# Patient Record
Sex: Female | Born: 1970 | Race: White | Hispanic: No | Marital: Married | Smoking: Current every day smoker
Health system: Southern US, Community
[De-identification: ages and names within clinical notes are randomized; demographics above are authoritative.]

## PROBLEM LIST (undated history)

## (undated) DIAGNOSIS — M549 Dorsalgia, unspecified: Secondary | ICD-10-CM

## (undated) DIAGNOSIS — E7800 Pure hypercholesterolemia, unspecified: Secondary | ICD-10-CM

## (undated) DIAGNOSIS — I4891 Unspecified atrial fibrillation: Secondary | ICD-10-CM

## (undated) DIAGNOSIS — F40243 Fear of flying: Secondary | ICD-10-CM

## (undated) DIAGNOSIS — F5101 Primary insomnia: Secondary | ICD-10-CM

## (undated) DIAGNOSIS — B977 Papillomavirus as the cause of diseases classified elsewhere: Secondary | ICD-10-CM

## (undated) DIAGNOSIS — R490 Dysphonia: Secondary | ICD-10-CM

## (undated) DIAGNOSIS — D519 Vitamin B12 deficiency anemia, unspecified: Secondary | ICD-10-CM

## (undated) DIAGNOSIS — K219 Gastro-esophageal reflux disease without esophagitis: Secondary | ICD-10-CM

## (undated) DIAGNOSIS — I499 Cardiac arrhythmia, unspecified: Secondary | ICD-10-CM

## (undated) DIAGNOSIS — F900 Attention-deficit hyperactivity disorder, predominantly inattentive type: Secondary | ICD-10-CM

## (undated) DIAGNOSIS — M542 Cervicalgia: Secondary | ICD-10-CM

## (undated) DIAGNOSIS — M2550 Pain in unspecified joint: Secondary | ICD-10-CM

## (undated) DIAGNOSIS — F17200 Nicotine dependence, unspecified, uncomplicated: Secondary | ICD-10-CM

## (undated) DIAGNOSIS — R600 Localized edema: Secondary | ICD-10-CM

## (undated) DIAGNOSIS — I2510 Atherosclerotic heart disease of native coronary artery without angina pectoris: Secondary | ICD-10-CM

## (undated) DIAGNOSIS — J45909 Unspecified asthma, uncomplicated: Secondary | ICD-10-CM

## (undated) DIAGNOSIS — F419 Anxiety disorder, unspecified: Secondary | ICD-10-CM

## (undated) DIAGNOSIS — M06049 Rheumatoid arthritis without rheumatoid factor, unspecified hand: Secondary | ICD-10-CM

## (undated) DIAGNOSIS — M1990 Unspecified osteoarthritis, unspecified site: Secondary | ICD-10-CM

## (undated) DIAGNOSIS — M797 Fibromyalgia: Secondary | ICD-10-CM

## (undated) DIAGNOSIS — M4317 Spondylolisthesis, lumbosacral region: Secondary | ICD-10-CM

## (undated) DIAGNOSIS — M4692 Unspecified inflammatory spondylopathy, cervical region: Secondary | ICD-10-CM

## (undated) HISTORY — DX: Primary insomnia: F51.01

## (undated) HISTORY — DX: Vitamin B12 deficiency anemia, unspecified: D51.9

## (undated) HISTORY — PX: CHOLECYSTECTOMY: SHX55

## (undated) HISTORY — DX: Pure hypercholesterolemia, unspecified: E78.00

## (undated) HISTORY — DX: Fibromyalgia: M79.7

## (undated) HISTORY — DX: Anxiety disorder, unspecified: F41.9

## (undated) HISTORY — PX: BREAST ENHANCEMENT SURGERY: SHX7

## (undated) HISTORY — DX: Papillomavirus as the cause of diseases classified elsewhere: B97.7

## (undated) HISTORY — DX: Localized edema: R60.0

## (undated) HISTORY — PX: CARPAL TUNNEL RELEASE: SHX101

## (undated) HISTORY — PX: TUBAL LIGATION: SHX77

## (undated) HISTORY — DX: Spondylolisthesis, lumbosacral region: M43.17

## (undated) HISTORY — PX: MANDIBLE SURGERY: SHX707

## (undated) HISTORY — DX: Cervicalgia: M54.2

## (undated) HISTORY — DX: Gastro-esophageal reflux disease without esophagitis: K21.9

## (undated) HISTORY — DX: Attention-deficit hyperactivity disorder, predominantly inattentive type: F90.0

## (undated) HISTORY — DX: Pain in unspecified joint: M25.50

## (undated) HISTORY — DX: Unspecified osteoarthritis, unspecified site: M19.90

## (undated) HISTORY — DX: Dorsalgia, unspecified: M54.9

## (undated) HISTORY — DX: Nicotine dependence, unspecified, uncomplicated: F17.200

## (undated) HISTORY — DX: Rheumatoid arthritis without rheumatoid factor, unspecified hand: M06.049

## (undated) HISTORY — PX: CARDIAC CATHETERIZATION: SHX172

## (undated) HISTORY — PX: FOOT SURGERY: SHX648

## (undated) HISTORY — PX: CERVICAL SPINE SURGERY: SHX589

## (undated) HISTORY — PX: TONSILLECTOMY: SUR1361

## (undated) HISTORY — DX: Dysphonia: R49.0

## (undated) HISTORY — DX: Fear of flying: F40.243

## (undated) HISTORY — PX: TYMPANOSTOMY TUBE PLACEMENT: SHX32

## (undated) HISTORY — DX: Unspecified inflammatory spondylopathy, cervical region: M46.92

---

## 1997-10-22 ENCOUNTER — Other Ambulatory Visit: Admission: RE | Admit: 1997-10-22 | Discharge: 1997-10-22 | Payer: Self-pay | Admitting: Obstetrics & Gynecology

## 2000-05-24 ENCOUNTER — Emergency Department (HOSPITAL_COMMUNITY): Admission: EM | Admit: 2000-05-24 | Discharge: 2000-05-24 | Payer: Self-pay | Admitting: Emergency Medicine

## 2001-05-13 ENCOUNTER — Encounter: Payer: Self-pay | Admitting: Surgery

## 2001-05-13 ENCOUNTER — Encounter: Payer: Self-pay | Admitting: Emergency Medicine

## 2001-05-13 ENCOUNTER — Encounter (INDEPENDENT_AMBULATORY_CARE_PROVIDER_SITE_OTHER): Payer: Self-pay | Admitting: Specialist

## 2001-05-13 ENCOUNTER — Inpatient Hospital Stay (HOSPITAL_COMMUNITY): Admission: EM | Admit: 2001-05-13 | Discharge: 2001-05-14 | Payer: Self-pay | Admitting: Emergency Medicine

## 2001-05-13 ENCOUNTER — Emergency Department (HOSPITAL_COMMUNITY): Admission: EM | Admit: 2001-05-13 | Discharge: 2001-05-13 | Payer: Self-pay | Admitting: *Deleted

## 2002-02-11 ENCOUNTER — Encounter: Payer: Self-pay | Admitting: Family Medicine

## 2002-02-11 ENCOUNTER — Ambulatory Visit (HOSPITAL_COMMUNITY): Admission: RE | Admit: 2002-02-11 | Discharge: 2002-02-11 | Payer: Self-pay | Admitting: Family Medicine

## 2004-01-25 ENCOUNTER — Other Ambulatory Visit: Admission: RE | Admit: 2004-01-25 | Discharge: 2004-01-25 | Payer: Self-pay | Admitting: Family Medicine

## 2004-08-11 ENCOUNTER — Emergency Department (HOSPITAL_COMMUNITY): Admission: EM | Admit: 2004-08-11 | Discharge: 2004-08-11 | Payer: Self-pay | Admitting: Emergency Medicine

## 2004-10-16 ENCOUNTER — Ambulatory Visit (HOSPITAL_COMMUNITY): Admission: RE | Admit: 2004-10-16 | Discharge: 2004-10-16 | Payer: Self-pay | Admitting: Obstetrics and Gynecology

## 2004-10-16 ENCOUNTER — Encounter (INDEPENDENT_AMBULATORY_CARE_PROVIDER_SITE_OTHER): Payer: Self-pay | Admitting: Specialist

## 2004-11-04 ENCOUNTER — Ambulatory Visit: Admission: RE | Admit: 2004-11-04 | Discharge: 2004-11-04 | Payer: Self-pay | Admitting: Gynecologic Oncology

## 2004-11-25 ENCOUNTER — Ambulatory Visit: Admission: RE | Admit: 2004-11-25 | Discharge: 2004-11-25 | Payer: Self-pay | Admitting: Gynecologic Oncology

## 2004-12-09 ENCOUNTER — Ambulatory Visit: Admission: RE | Admit: 2004-12-09 | Discharge: 2004-12-09 | Payer: Self-pay | Admitting: Gynecologic Oncology

## 2004-12-23 ENCOUNTER — Ambulatory Visit: Admission: RE | Admit: 2004-12-23 | Discharge: 2004-12-23 | Payer: Self-pay | Admitting: Gynecologic Oncology

## 2005-02-03 ENCOUNTER — Ambulatory Visit: Admission: RE | Admit: 2005-02-03 | Discharge: 2005-02-03 | Payer: Self-pay | Admitting: Gynecologic Oncology

## 2005-05-05 ENCOUNTER — Ambulatory Visit: Admission: RE | Admit: 2005-05-05 | Discharge: 2005-05-05 | Payer: Self-pay | Admitting: Gynecologic Oncology

## 2005-05-15 ENCOUNTER — Other Ambulatory Visit: Admission: RE | Admit: 2005-05-15 | Discharge: 2005-05-15 | Payer: Self-pay | Admitting: Obstetrics and Gynecology

## 2005-07-21 ENCOUNTER — Ambulatory Visit (HOSPITAL_COMMUNITY): Admission: RE | Admit: 2005-07-21 | Discharge: 2005-07-21 | Payer: Self-pay | Admitting: Obstetrics and Gynecology

## 2006-02-11 IMAGING — US US TRANSVAGINAL NON-OB
1 series · 13 of 25 positions shown · non-contrast
Comparison: none

CLINICAL DATA: Severe abdominal and pelvic pain.  Assess for cyst versus torsion. 
 PELVIC ULTRASOUND:
 Initially, transabdominal scanning is performed through a full urinary bladder.  Transvaginal scanning is then followed.  The study includes duplex and color flow Doppler evaluation of the ovaries. 
 The uterus is normal in size measuring 7.7 cm longitudinally x 3.7 x 5 cm.  The myometrium is slightly heterogeneous as one might see with early leiomyomatous change.  The possibility exists this could represent adenomyomatosis.  There is no intrauterine fluid or pregnancy.  
 The right ovary measures 4.4 x 3.3 x 3.0 cm and contains a 3.4 x 2.3 x 1.8 cm complex cyst.  This could be a hemorrhagic functional cyst.  The possibility of a tuboovarian abscess in the early stages does exist.  Endometrioma could also have this appearance.  There is blood flow to the right ovary.  The left ovary is normal measuring 2.4 x 1.6 x 1.6 cm.  It contains a few small follicular cysts and shows normal blood flow.  There is a moderate amount of free fluid.

[Series 1: unknown · 0.30mm/px · 13 of 67 slices shown]
[im 1/67]
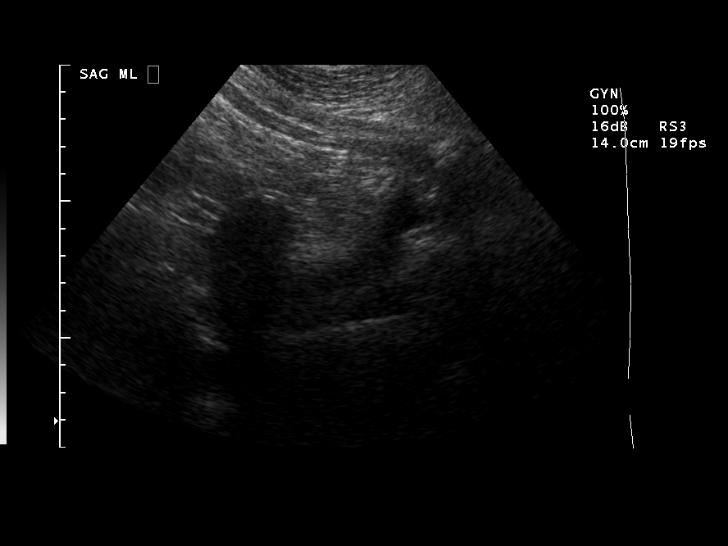
[im 6/67]
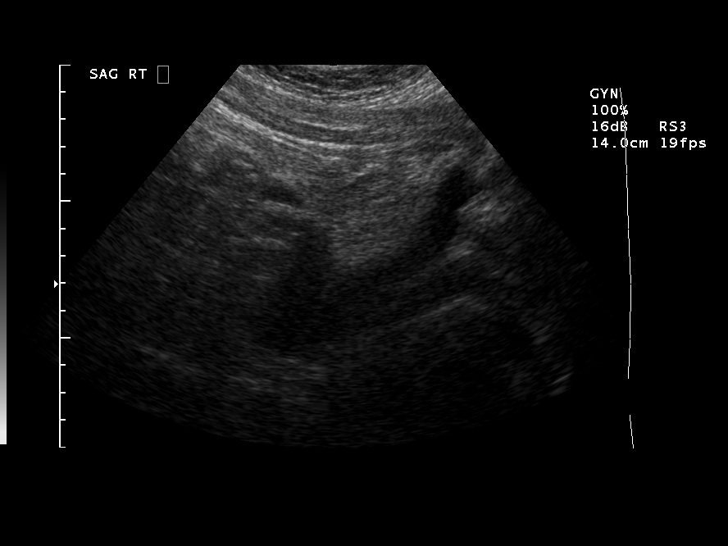
[im 12/67]
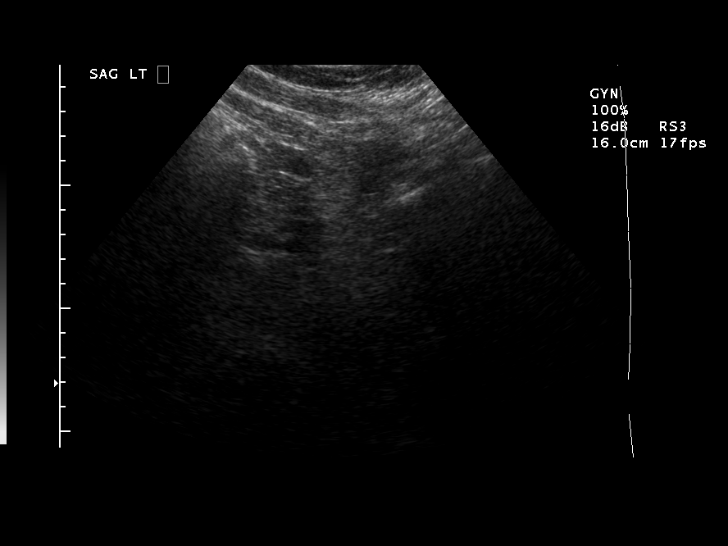
[im 17/67]
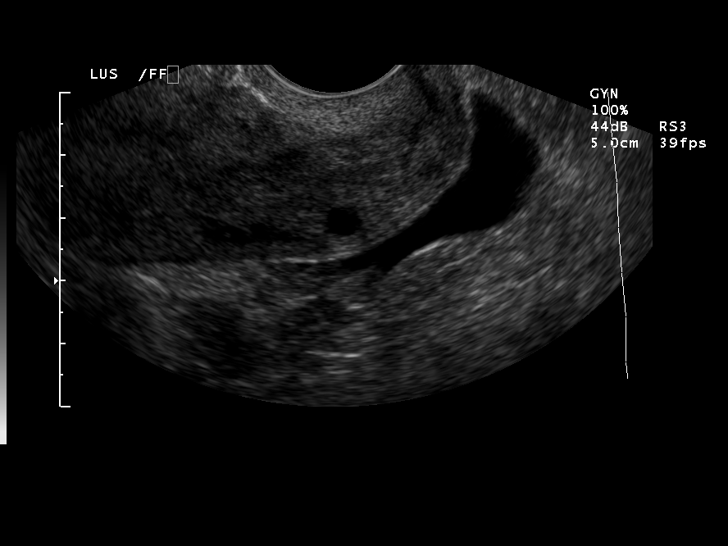
[im 23/67]
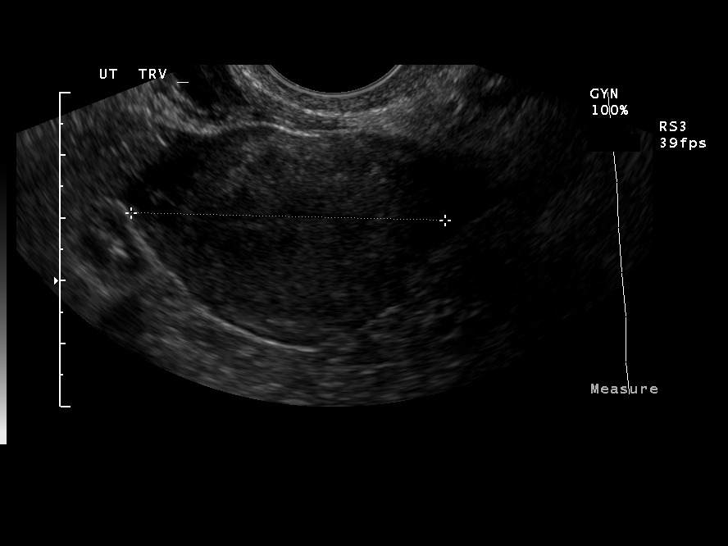
[im 28/67]
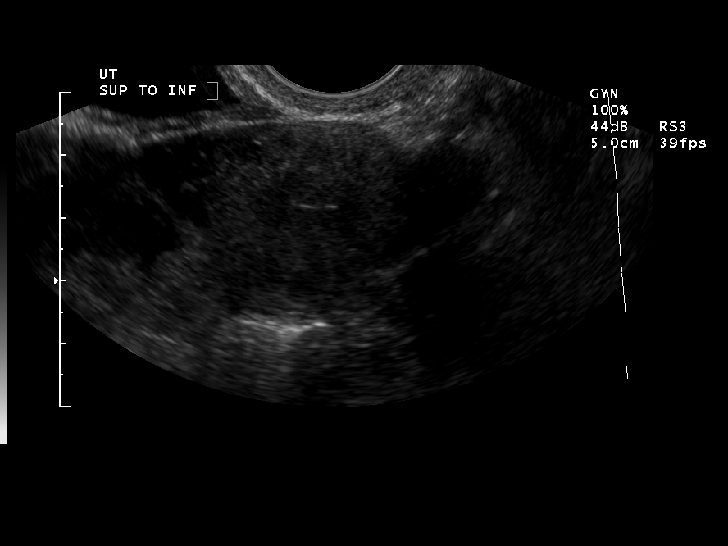
[im 34/67]
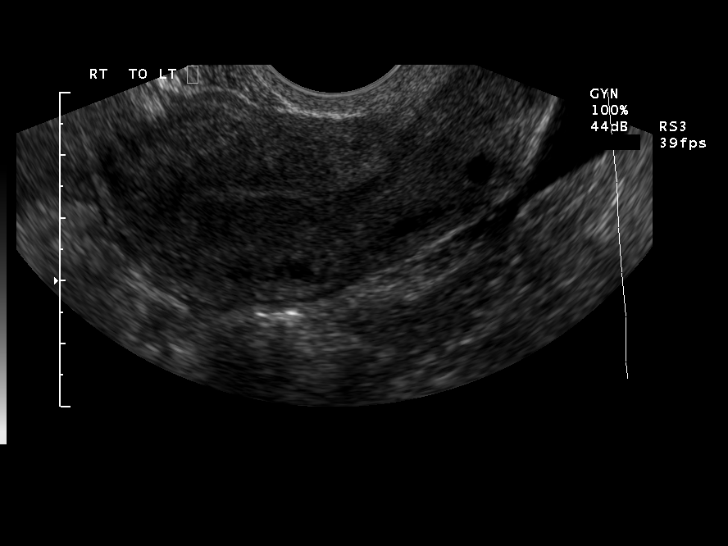
[im 39/67]
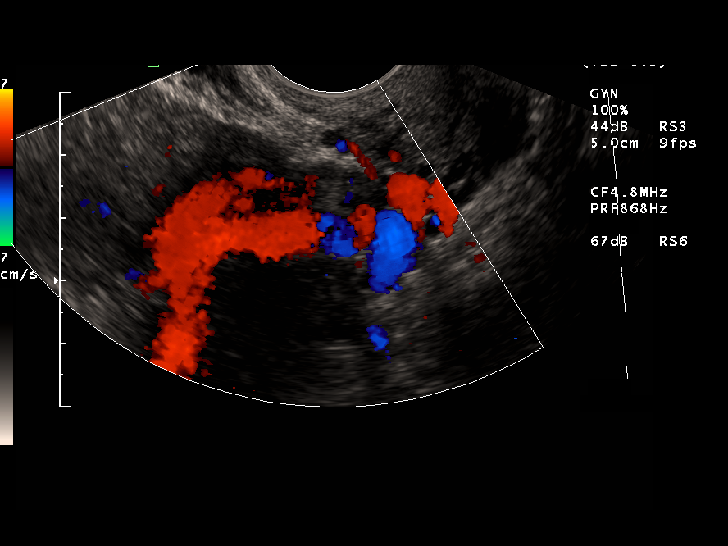
[im 45/67]
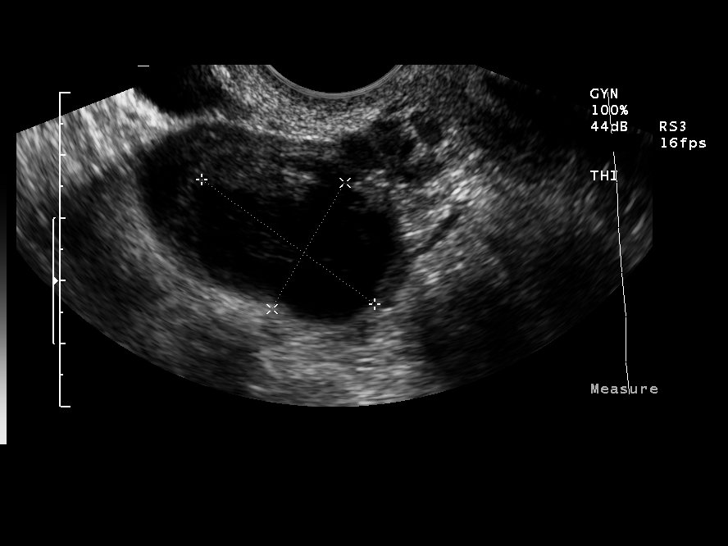
[im 50/67]
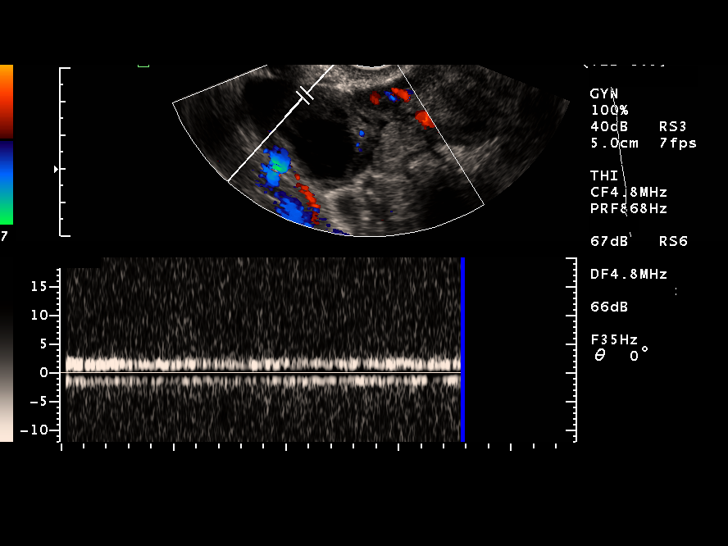
[im 56/67]
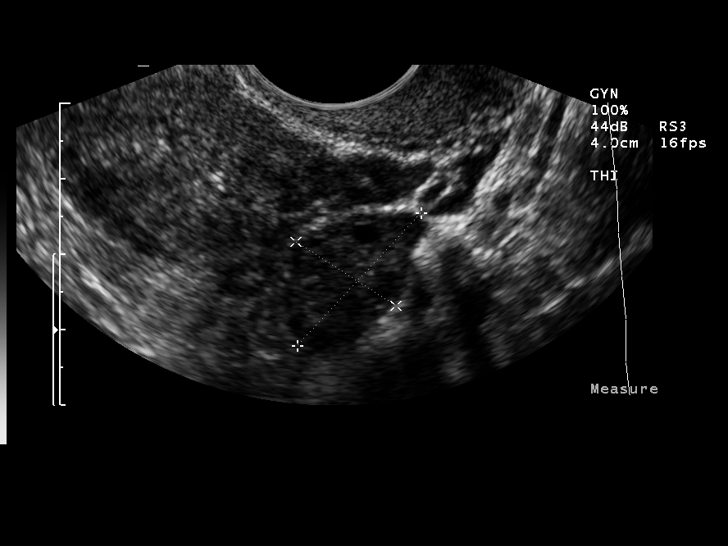
[im 61/67]
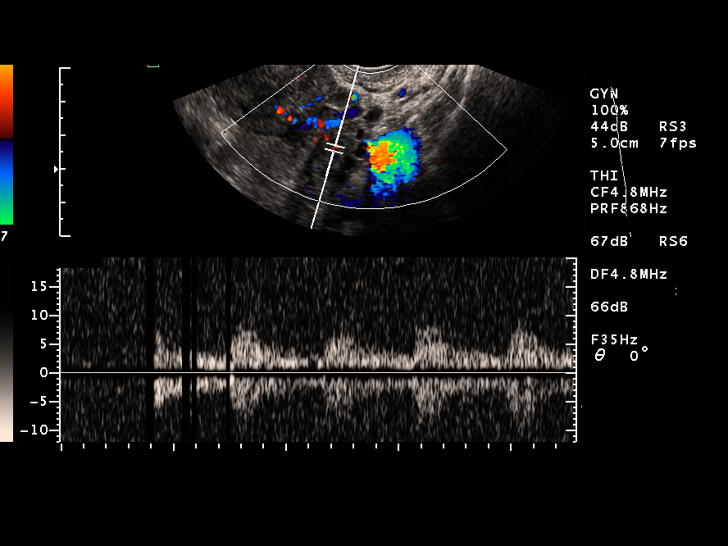
[im 67/67]
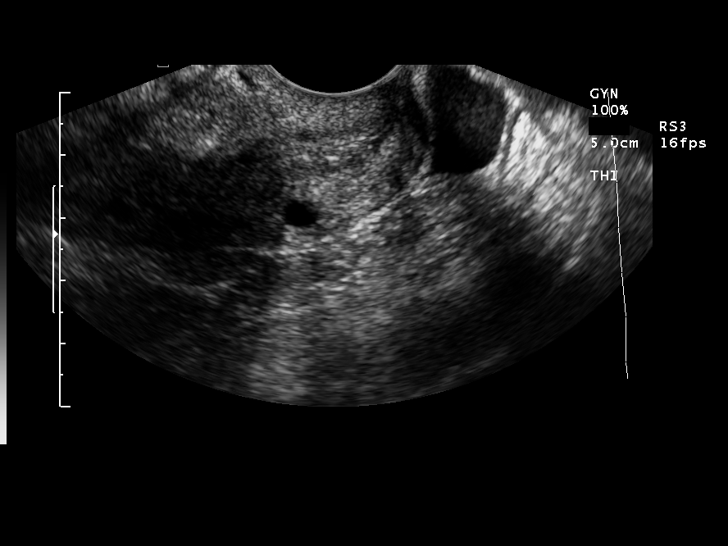

[13 of 25 positions shown; findings below may reference images not displayed]

IMPRESSION: 1.  Complex right ovarian cyst measuring 3.4 x 2.3 x 1.8 cm.  Moderate amount of free fluid.  This could be a hemorrhagic ovarian cyst.  The differential diagnosis does include tuboovarian abscess and endometrioma. 
 2.  Heterogeneous appearance of the uterus which could represent early leiomyomatous change or conceivably adenomyomatosis.  
 3.  No evidence of ovarian torsion.

## 2007-02-22 ENCOUNTER — Ambulatory Visit: Admission: RE | Admit: 2007-02-22 | Discharge: 2007-02-22 | Payer: Self-pay | Admitting: Gynecologic Oncology

## 2007-04-05 ENCOUNTER — Ambulatory Visit: Admission: RE | Admit: 2007-04-05 | Discharge: 2007-04-05 | Payer: Self-pay | Admitting: Gynecologic Oncology

## 2008-08-01 ENCOUNTER — Emergency Department (HOSPITAL_COMMUNITY): Admission: EM | Admit: 2008-08-01 | Discharge: 2008-08-01 | Payer: Self-pay | Admitting: Emergency Medicine

## 2008-10-16 ENCOUNTER — Ambulatory Visit: Admission: RE | Admit: 2008-10-16 | Discharge: 2008-10-16 | Payer: Self-pay | Admitting: Gynecologic Oncology

## 2010-03-20 ENCOUNTER — Encounter: Admission: RE | Admit: 2010-03-20 | Discharge: 2010-03-20 | Payer: Self-pay | Admitting: Gastroenterology

## 2010-09-17 ENCOUNTER — Ambulatory Visit: Payer: Self-pay | Attending: Gynecologic Oncology | Admitting: Gynecologic Oncology

## 2010-10-27 LAB — POCT URINALYSIS DIP (DEVICE)
Bilirubin Urine: NEGATIVE
Glucose, UA: NEGATIVE mg/dL
Glucose, UA: NEGATIVE mg/dL
Hgb urine dipstick: NEGATIVE
Ketones, ur: NEGATIVE mg/dL
Nitrite: NEGATIVE
Protein, ur: NEGATIVE mg/dL
Specific Gravity, Urine: 1.01 (ref 1.005–1.030)
Specific Gravity, Urine: 1.01 (ref 1.005–1.030)
Urobilinogen, UA: 0.2 mg/dL (ref 0.0–1.0)

## 2010-10-27 LAB — URINE CULTURE

## 2010-11-25 NOTE — Consult Note (Signed)
[name], [name]             ACCOUNT NO.:  [id number]   MEDICAL RECORD NO.:  [id number]          PATIENT TYPE:  OUT   LOCATION:  GYN                          FACILITY:  [facility]   PHYSICIAN:  Paola A. 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  I last saw in May 2006.  I last  saw her in October 2006 at which time she was noted to have new  condylomatous lesions.  She had been using Aldara three times a week,  used it for several weeks, and then had a period of time when she ran  out the prescription and did not use it for a month. When I saw her in  October 2006, the labia minora on the left side with almost completely  replaced with condylomatous changes.  The right side was more so on the  edge of the labia than on the side of the labia with condylomatous-type  changes in the deep posterior fourchette, left greater than right.  She  was going to use the Aldara fairly religiously. We had a phone call from  the patient stating that she did not feel that the Aldara was working  for her, and she wished to proceed with Condylox.  I did call in  Condylox 0.5% gel to use twice daily for 3 days weekly in December 2007.  We have not seen her back since that time. She has been seeing Dr.  Jackelyn Knife for routine GYN care and had a normal Pap smear which she is  currently doing. She will allow the condyloma to grow for a period time.  She will then coat her entire vulva with Condylox, usually around 10  o'clock at night, leaving it on until 8 o'clock in the morning, washes  it off, and then it excoriates her bottom. The lesions go away for a few  days, and then she notices them slowly returning. She will again take  another month to month and one-half off and do the same pain.  She  does  think that she is placing on normal, non-condylomatous tissue as she  places it throughout the vulva, so she is never able to get 3 days of  treatment in. She is typically doing one treatments every 4-6 weeks.  Most recently she has not used Condylox for the past 8 weeks.  She  otherwise is doing fairly well without any significant complaints.   PHYSICAL EXAMINATION:  External genitalia is notable for labia minora on  the patient's left side.  She has a 1 x 1-cm hyperkeratotic raised  plaque on the outside portion of the labia minora.  Just on the inside,  she has small scattered plaques, each being approximately 3-4 mm. On the  labia minora on her right side, she is again has the condylomatous type  plaque, both on the external and  internal surfaces of labia, greater on  the external surface of the labia minora on the right side than on the  internal surface.  On the posterior fourchette, she has raised  hyperpigmented lesion on the right side greater than the left.  On the  left area, she has hyperpigmented changes.  However, the areas are flat  and smooth.  The remainder of the vulva is unremarkable.   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  I do think  that having the patient look at her vulva with mirror that she is most  likely getting it on normal tissue as well, and she leaving  off for a  prolonged period time. We are going to proceed with several strategies  to see if we can get her to a period of time where she may be able to  eradicate the condyloma for several months rather than dealing with this  every month to month and one-half.  Our plan will be that she will  apply, with the help of a mirror, Vaseline to the normal unaffected  tissues. She is to  place Condylox on the abnormal tissues and wash it off within 46-hours.  She will try to do this for three consecutive days. She will do this x1,  keep a calendar of how the symptoms are, when the  lesions leave, and  then return, and plan on coming back to see me in a month.      Paola A. Duard Brady, MD  Electronically Signed     PAG/MEDQ  D:  02/22/2007  T:  02/23/2007  Job:  [id number]   cc:   Telford Nab, R.N.  [address]  [place], [place] [address]   Zenaida Niece, M.D.  Fax: [phone]

## 2010-11-25 NOTE — H&P (Signed)
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   HISTORY OF PRESENT ILLNESS:  This is a 40 year old who underwent laser  ablation of VIN 3 in May of 2006.  In October of 2006, the patient  reported return of lesions and began using Aldara.  There was futility  with the use of Aldara and she has since used Condylox gel.  In August  of 2008, significant condylomas  and scattered plaques were noted  involving both labia.  The external labia, however, were spared.  The  patient states that the lesions are stable and she denies any focal  pruritus.  She uses the Condylox likely once per week, and at this time  she reports continued use of tobacco products.   PAST MEDICAL HISTORY:  No interval changes.   PAST SURGICAL HISTORY:  No interval changes.   PHYSICAL EXAMINATION:  Well-developed female in no acute distress.  On  pelvic examination, external genitalia notable for condyloma involving  the labia minora bilaterally and involving the posterior fourchette.  There are hyperkeratotic raised plaques without extension into the  vagina.   IMPRESSION:  Vulvar condyloma.  The patient was offered at this visit a  trial of repeat CO2 laser; however, she states that the lesion is  stable, and she would prefer not to have a surgical intervention.  As  such, Condylox gel 0.5% for use twice daily  for 3 days and  discontinuation for 4 days.  She has also been advised to include  multivitamin in her diet and to stop smoking.  I have asked her to  follow up in 6 months.      Laurette Schimke, MD  Electronically Signed     WB/MEDQ  D:   10/16/2008  T:  10/16/2008  Job:  [id number]   cc:   Zenaida Niece, M.D.  Fax: [phone]   Telford Nab, R.N.  [address]  [place], [place] [address]

## 2010-11-25 NOTE — Consult Note (Signed)
[name], [name]             ACCOUNT NO.:  [id number]   MEDICAL RECORD NO.:  [id number]          PATIENT TYPE:  OUT   LOCATION:  GYN                          FACILITY:  [facility]   PHYSICIAN:  Paola A. Duard Brady, MD    DATE OF BIRTH:  [birth year]   DATE OF CONSULTATION:  04/05/2007  DATE OF DISCHARGE:                                 CONSULTATION   The patient is a 40 year old who underwent laser ablation of VIN-III in  May 2006.  By October 2006 she had new, filamentous lesions and began  using Aldara three times a week and did it for several weeks and had a  period of time when she ran out of the prescription and did not use it.  She then had consistent and recurrent condyloma.  She has essentially  Aldara, which has not really worked for her and then tried Condylox gel.  She states that the Condylox gel would help maintain the lesions for a  period of time but as soon as she stops using it, the condyloma return.  She also was complaining of significant excoriations and we felt at the  time her last visit that she was getting the Condylox on her normal  tissues and excoriating the normal tissues rather than treating only the  abnormal lesions.  When I last saw her in August 2008, she had  significant condyloma and scattered plaques among and between the labia  minora.  The external labia majora were fairly spared.  She has been  using the Condylox.  She used it for three days consecutively last  month.  She states the lesions went away completely.  Then this past  Sunday the lesions have again returned and she reapplied the Condylox.  It appears that when she went to the pharmacy she was given a Condylox  solution rather than the gel and has had some difficulty applying at,  though overall she has been more pleased with the application than she  was with the gel.   PHYSICAL EXAMINATION:  Weight 168 pounds.  Well-nourished, alert female in no acute distress.  PELVIC:  External  genitalia is notable for significant condyloma along  the labia minora bilaterally.  She has hyperkeratotic, raised plaques on  the outside portion of the labia minora, left greater than right.  Just  on the inside of the labia minora she has small, scattered plaques, each  being approximately 3-4 mm.  You can see areas of denuded skin and areas  of white changes consistent with Condylox application.   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  She was offered continued topical  therapies as it appears to maintain her disease.  She is complaining of  increased stress at this time and a surgical procedure would not be  beneficial to her due to her stress level, per her report.  She was  offered a repeat laser ablation. she is not particularly excited about  as the condyloma appear to have returned quite quickly after her laser  ablation, and she is not particularly excited regarding vulvectomy.  At  this point she will continue using the Condylox to maintain the  condylomatous changes and vulvar intraepithelial neoplasia at a stable  level.  Will also add folic acid 1 mg.  She was offered an appointment  with me and she would like to return 6 months.  I did ask her, however  to look at the lesions fairly regularly and if she continues to have  progressive changes, she needs come back sooner than 6 months.  She  understands and will do so.      Paola A. Duard Brady, MD  Electronically Signed     PAG/MEDQ  D:  04/05/2007  T:  04/06/2007  Job:  [id number]   cc:   Zenaida Niece, M.D.  Fax: [phone]   Telford Nab, R.N.  [address]  [place], [place] [address]

## 2010-11-28 NOTE — Op Note (Signed)
NAMEJOY, REIGER             ACCOUNT NO.:  [id number]   MEDICAL RECORD NO.:  [id number]          PATIENT TYPE:  AMB   LOCATION:  SDC                           FACILITY:  [facility]   PHYSICIAN:  Zenaida Niece, M.D.DATE OF BIRTH:  01/29/1971   DATE OF PROCEDURE:  07/21/2005  DATE OF DISCHARGE:                                 OPERATIVE REPORT   PREOPERATIVE DIAGNOSES:  1.  Abnormal uterine bleeding with dysmenorrhea.  2.  Desires surgical sterility.   POSTOPERATIVE DIAGNOSIS:  1.  Abnormal uterine bleeding with dysmenorrhea.  2.  Desires surgical sterility.   PROCEDURES:  Hysteroscopy, followed by NovaSure endometrial ablation,  followed by attempted placement of Essure coils, followed by laparoscopy  with bilateral tubal fulguration.   SURGEON:  Zenaida Niece, M.D.   ASSISTANT:  None.   ANESTHESIA:  General endotracheal tube.   SPECIMENS:  None.   ESTIMATED BLOOD LOSS:  50 mL.   COMPLICATIONS:  Unable to pass the Essure coil in the patient's left tube.   Uterine depth was 4 cm, uterine width was 3.5 cm and the NovaSure device  used 77 watts of power for 62 seconds.   PROCEDURE IN DETAIL:  The patient was taken to the operating room and placed  in the dorsal supine position.  General anesthesia was induced and she was  placed in mobile stirrups.  Perineum and vagina were then prepped and draped  in the usual sterile fashion and bladder drained with a red rubber catheter.  A Graves speculum was inserted into the vagina and the anterior lip of the  cervix was grasped with a single-tooth tenaculum.  Paracervical block was  then performed with 16 mL of 2% plain lidocaine.  The uterus then sounded to  4 cm.  The cervix was gradually dilated and then the cervix measured 4 cm  with a 7 cm Hegar dilator.  The hysteroscope was inserted and good  visualization was achieved.  She had a long, narrow cavity without  significant lesions.  There was no significant uterine  septum.  Both tubal  ostia were identified with the one on the right most easily identified.  All  fluid was removed and the hysteroscope was removed.  The cervix was dilated  to a size 25 and the NovaSure device was then introduced.  It was then  appropriately deployed with the measurements as mentioned above.  It then  passed the CO2 test and endometrial ablation was then performed without  complications.  The device was removed and appeared to be intact.  The  hysteroscope was then reintroduced and confirmed good endometrial ablation.  The right tube still appeared to be easier to see, so we proceeded with  attempting to pass the Essure coil on the left side.  I could not actually  see the opening of the tube, but it was obvious where an opening of the tube  should be based on the uterine contour.  I initially passed and deployed an  Essure coil; however, it seemed to, when it was uncoiling, actually withdraw  from the tube.  Approximately 12 coils  were visualized so, the coil was  removed with a grasper through the hysteroscope.  I then attempted to get  better visualization of the left tubal ostium.  There was a piece of scar  tissue over this ostium, and this was removed sharply.  I was unable to  actually visualize the opening of the tube.  I did attempt to pass the other  Essure coil in the pack through the tube, and it did not go.  I thus  abandoned this portion of the procedure and elected to proceed with  laparoscopy with tubal ligation.  All instruments were removed from the  vagina.  The single-tooth tenaculum was removed and a Hulka clip was then  applied to the cervix for uterine manipulation.  Abdomen was then prepped  and draped in the usual sterile fashion.  Both myself and the scrub tech  changed gloves and gowns.  Infraumbilical skin was infiltrated with 0.25%  Marcaine and a 1 cm vertical incision was made in a previous scar.  The  Veress needle was inserted into the  peritoneal cavity and placement  confirmed by the water drop test and an opening pressure of 3 mmHg.  Three  liters of CO2 gas were insufflated and the Veress needle was removed.  The  size 11 disposable trocar was then introduced and placement confirmed by the  laparoscope.  Intra-abdominal and pelvic anatomy appeared normal.  Both  fallopian tubes were easily identified and traced to their fimbriated ends.  Ovaries appeared normal as well as the uterus.  Three segments of the middle  portion of each tube were all were fulgurated with bipolar cautery until the  amp meter read 0 in all spots.  This appeared to achieve good fulguration  with adequate hemostasis.  The laparoscope was removed and all gas allowed  to deflate from the abdomen.  The laparoscopic trocar was then removed.  The  skin was closed with interrupted subcuticular sutures of 4-0 Vicryl,  followed by Dermabond.  The Hulka tenaculum was then removed from the  cervix.  The patient tolerated the procedure well.  She was awakened in the  operating room and taken to the recovery in stable condition.  Counts were  correct x2, she received no antibiotics, and she had PAS hose on throughout  procedure.      Zenaida Niece, M.D.  Electronically Signed     TDM/MEDQ  D:  07/21/2005  T:  07/21/2005  Job:  [id number]

## 2010-11-28 NOTE — Consult Note (Signed)
NAMEKALLEIGH, HARBOR             ACCOUNT NO.:  [id number]   MEDICAL RECORD NO.:  [id number]          PATIENT TYPE:  OUT   LOCATION:  GYN                          FACILITY:  [facility]   PHYSICIAN:  Paola A. Duard Brady, MD    DATE OF BIRTH:  1971-01-13   DATE OF CONSULTATION:  11/25/2004  DATE OF DISCHARGE:                                   CONSULTATION   HISTORY OF PRESENT ILLNESS:  Ms. Arizpe is a very pleasant 40 year old  gravida 2, para 2, who I initially saw on November 04, 2004.  She had extensive  condylomatous raised hyperpigmented lesions throughout the vulva.  Biopsies  were consistent with VIN-3.  She underwent extensive laser ablation of these  lesions on May 5.  She called in saying that she thought the lesions were  getting infected.  She returned to work on May 15 and was unable to do her  peri-care yesterday as it was not convenient at work.  She is otherwise  using her silvadine cream.  Pain is well-controlled.  The biggest issue is  pruritus.   PHYSICAL EXAMINATION:  VITAL SIGNS:  Weight 148 pounds, blood pressure  110/60.  GENERAL:  Well-nourished, well-developed female in no acute distress.  GENITALIA:  External genitalia is notable for extensive laser ablation.  She  has Silvadene cream.  The area is healing well with pink tissue and thin  yellow-white eschar.  There is no evidence of fluctuance or superinfection.   ASSESSMENT:  A 40 year old with a history of VIN-3, status post extensive  laser ablation.   PLAN:  1.  She was given a squirt bottle to use at her employer and she was shown      how to do that on the toilet, to cleanse the area, pat dry, and then      apply her Silvadene and Lidocaine.  For the itching and the pruritus she      can use Lidocaine ointment as well as tea bags for symptomatic relief.  2.  She will return to see me on May 30 and call me should there be any      issues in the interim.      PAG/MEDQ  D:  11/25/2004  T:  11/25/2004  Job:   [id number]

## 2010-11-28 NOTE — Op Note (Signed)
NAME:  [name], [name]             ACCOUNT NO.:  [id number]   MEDICAL RECORD NO.:  [id number]          PATIENT TYPE:  AMB   LOCATION:  SDC                           FACILITY:  [facility]   PHYSICIAN:  Zenaida Niece, M.D.DATE OF BIRTH:  May 08, 1971   DATE OF PROCEDURE:  10/16/2004  DATE OF DISCHARGE:                                 OPERATIVE REPORT   PREOPERATIVE AND POSTOPERATIVE DIAGNOSES:  Vulvar lesions.   PROCEDURE:  Vulvar biopsy x 2.   SURGEON:  Zenaida Niece, M.D.   ANESTHESIA:  IV sedation and local anesthesia.   SPECIMENS:  Vulvar biopsy at 9 o'clock and 5 o'clock.   ESTIMATED:  Blood loss minimal.   COMPLICATIONS:  None.   FINDINGS:  She has diffuse dark pigmentation around from three to nine  o'clock. There were flat lesions consistent with condyloma at about 10  o'clock and spread throughout this dark area. This dark area appears to be  consistent with prior laser vaporization. At 9 o'clock there was an area  that appeared to be flat condyloma, but also one area with what appeared to  be an ulcer type lesion. Biopsy was taken at this area. Irregularities in  the skin at 5 o'clock also prompted biopsy there.   PROCEDURE IN DETAIL:  The patient was taken to the operating room and placed  in the dorsal supine position. She was given IV sedation and placed in  mobile stirrups. Perineum was prepped and draped in the usual sterile  fashion. Acetic acid was wiped across the perineum. No significant  acetowhite areas were identified. As per above, after inspection, I decided  to take biopsies at 9 o'clock and 5 o'clock. These areas were infiltrated  with 1% lidocaine. Horizontal elliptical incisions were made and the skin  was removed. The skin defects were then closed with running locking 4-0  Vicryl in a horizontal fashion. Bleeding in each side was controlled with  figure-of-eight suture of 4-0 Vicryl for adequate hemostasis. The patient  tolerated the procedure well  and was taken to the recovery in stable  condition. Counts were correct.      TDM/MEDQ  D:  10/16/2004  T:  10/16/2004  Job:  [id number]

## 2010-11-28 NOTE — H&P (Signed)
NAME:  [name], [name]             ACCOUNT NO.:  [id number]   MEDICAL RECORD NO.:  [id number]          PATIENT TYPE:  AMB   LOCATION:  SDC                           FACILITY:  [facility]   PHYSICIAN:  Zenaida Niece, M.D.DATE OF BIRTH:  16-Nov-1970   DATE OF ADMISSION:  DATE OF DISCHARGE:                                HISTORY & PHYSICAL   CHIEF COMPLAINT:  Vulvar lesions.   HISTORY OF PRESENT ILLNESS:  This is a 40 year old female, para 2-0-0-2 whom  I saw for an exam on August 26, 2004. At that time, she had been seen on  January 30 in the emergency room for left pelvic pain. An ultrasound  revealed a right ovarian cyst 2 x 2 x 3 cm that was complex with moderate  free pelvic fluid. The pain only lasted for approximately three hours and  rapidly resolved. She also complained of some external irritation that is  worse after menses. Physical examination was significant for a brown area  from 3 to 9 o'clock on her vulva with multiple lesions that were flat  consistent with condyloma but some of them appeared atypical. She is  admitted for biopsy of these vulvar lesions.   PAST OB HISTORY:  Vaginal delivery x2 at term.   PAST MEDICAL HISTORY:  Negative.   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   PAST SURGICAL HISTORY:  Laser cone, tonsillectomy.   ALLERGIES:  None known.   CURRENT MEDICATIONS:  None.   FAMILY HISTORY:  Noncontributory.   PHYSICAL EXAMINATION:  GENERAL:  This is a well-developed female in no acute  distress.  NECK:  Supple without lymphadenopathy or thyromegaly.  LUNGS:  Clear to auscultation.  HEART:  Regular rate and rhythm without murmur.  ABDOMEN:  Soft is soft, nontender, nondistended without palpable masses.  EXTREMITIES:  No edema and are nontender.  PELVIC:  External genitalia reveals a brown area from 3 to 9 o'clock with  multiple lesions which are flat and consistent with condyloma but some  of  them appear atypical. On speculum exam, her cervix is normal with a normal  wet prep as well. On bimanual exam, she had a small mid planar nontender  uterus with no adnexal masses. She was slightly tender on her left side.   ASSESSMENT:  Vulvar lesions. Some of these appear to be condyloma but some  of them appear to be atypical.   PLAN:  Admit the patient for biopsy of these vulvar lesions.      TDM/MEDQ  D:  10/15/2004  T:  10/15/2004  Job:  [id number]

## 2010-11-28 NOTE — H&P (Signed)
NAME:  [name], [name]             ACCOUNT NO.:  [id number]   MEDICAL RECORD NO.:  [id number]          PATIENT TYPE:  AMB   LOCATION:  SDC                           FACILITY:  [facility]   PHYSICIAN:  Zenaida Niece, M.D.DATE OF BIRTH:  06-03-1971   DATE OF ADMISSION:  07/21/2005  DATE OF DISCHARGE:                                HISTORY & PHYSICAL   CHIEF COMPLAINT:  Menorrhagia, dysmenorrhea, and desires surgical sterility.   HISTORY OF PRESENT ILLNESS:  This is a 40 year old white female, para 2-0-0-  2, who was seen for an annual exam in November of 2006.  At that time, she  was on Ovcon 35 birth control pills and was having menses every month, but  was having breakthrough bleeding and menses were heavier and crampier when  she stopped the birth control pill.  She also wished to undergo a procedure  for permanent sterility.  Exam showed a normal uterus.  All options were  discussed with the patient.  She wishes to undergo hysteroscopy with  NovaSure endometrial ablation followed by placement of Essure coils and  possible laparoscopic tubal fulguration.   PAST OBSTETRIC HISTORY:  Significant for two vaginal deliveries at term  without complications.   PAST MEDICAL HISTORY:  Essentially negative.   PAST SURGICAL HISTORY:  1.  Tonsillectomy.  2.  A cone biopsy.  3.  A vulvar biopsy.  4.  Laser vaporization of the vulva.   GYNECOLOGIC HISTORY:  Significant for the above-mentioned cervical dysplasia  and VIN-3, as well.   SOCIAL HISTORY:  The patient smokes one to one-and-a-half packs of  cigarettes a day, and denies significant alcohol or drug use.   FAMILY HISTORY:  No GYN or colon cancer.   REVIEW OF SYSTEMS:  Bowel and bladder function is normal.   PHYSICAL EXAMINATION:  VITAL SIGNS:  Weight is 161, blood pressure 110/74,  pulse 88.  GENERAL:  This is a well-developed white female in no acute distress.  NECK:  Supple without lymphadenopathy or thyromegaly.  LUNGS:   Clear to auscultation.  HEART:  Regular rate and rhythm without murmur.  ABDOMEN:  Soft, nontender, nondistended, without palpable masses.  EXTREMITIES:  No edema and are nontender.  PELVIC:  External genitalia has healing areas from her previous laser  vaporization.  Speculum exam reveals a normal cervix.  Pipelle was done,  which sounded to 7-8 cm and revealed minimal tissue.  On bimanual exam, she  has a small anteverted to mid planer nontender uterus and no adnexal masses.   ASSESSMENT:  1.  Desires surgical sterility.  All contraceptive options have been      discussed with the patient.  She understands that the tubal      sterilization is permanent with a failure rate of approximately 1 in      150.  Options were discussed with this, and she wishes to undergo      placement of coils with the Essure procedure.  She does want      laparoscopic sterilization if this is unable to be done.  2.  Menorrhagia and dysmenorrhea.  She  has a normal endometrial biopsy.  The      endometrial cavity has not been assessed yet.  Options were discussed,      and she wishes to undergo endometrial ablation.   PLAN:  The plan is to admit the patient on the day of surgery to undergo a  hysteroscopy with NovaSure endometrial ablation.  I will then proceed with  placement of Essure coils and possible laparoscopy with tubal fulguration if  unable to place the coils.      Zenaida Niece, M.D.  Electronically Signed     TDM/MEDQ  D:  07/20/2005  T:  07/20/2005  Job:  [id number]

## 2010-11-28 NOTE — Op Note (Signed)
[facility]  Patient:    [name], [name] Visit Number: [id number] MRN: [id number]          Service Type: Attending:  Verlin Grills, M.D. Dictated by:   Verlin Grills, M.D. Proc. Date: 05/13/01                             Operative Report  HISTORY:  [name] (date of birth [birth year]) is a 40 year old female.  She tells me she has had predominantly nocturnal postprandial attacks of epigastric--right upper quadrant abdominal pain, radiating into her chest, usually lasting six hours in duration before spontaneously resolving completely over the past year.  She is asymptomatic between attacks.  [name] presents to the emergency room with the onset of epigastric--right upper quadrant abdominal pain last night and prompting her evaluation in the emergency room this morning.  She got no sleep through the night and had some nausea and vomiting but no gastrointestinal bleeding.  She describes the pain intensity as 10/10.  Her [facility] ER evaluation includes the following:  (1) normal electrocardiogram, (2) normal acute abdominal x-ray series, (3) normal complete metabolic profile, (4) normal lipase, (5) negative serum pregnancy test, (6) white blood cell count 10,200 and hemoglobin 13 gm.  MEDICATION ALLERGIES:  NONE.  CHRONIC MEDICATIONS:  Birth control pills.  PAST MEDICAL HISTORY:  Fractured jaw requiring hospitalization.  SOCIAL HISTORY:  [name] is married.  She has two children.  Her husband is at her bedside in the [facility] emergency room.  HABITS:  [name] does smoke cigarettes and does take birth control pills, but she does not consume alcohol to excess.  GENERAL APPEARANCE:  [name] has received Demerol and Morphine in the emergency room and is sleepy but responds to questions appropriately.  HEENT: Sclerae nonicteric.  Conjunctivae normal.  LUNGS: Lungs are clear to  auscultation.  CARDIAC:  Reveals a regular rhythm without murmurs.  ABDOMEN:  The patient experiences pain to light palpation in the epigastrium and right upper quadrant.  There is no significant lower abdominal pain or left upper quadrant pain.  Percussion to the right costophrenic angle does elicit pain.  ASSESSMENT: Recurrent abdominal pain.  Rule out acute cholecystitis. Differential diagnosis would also include acute appendicitis, kidney stone.  I doubt represents peptic ulcer disease with penetration, pancreatitis, aortic dissection, or pulmonary emboli.  PLAN: I will obtain a urinalysis.  I will sens her for an emergency abdominal ultrasound.  If the abdominal ultrasound is normal, I will proceed with a hepatobiliary scan to rule out cystic duct obstruction.  If the hepatobiliary scan is normal, I will schedule her for a CT scan of the abdomen and pelvis. Dictated by:   Verlin Grills, M.D. Attending:  Verlin Grills, M.D. DD:  05/13/01 TD:  05/16/01 Job: [id number]

## 2010-11-28 NOTE — Consult Note (Signed)
NAMEEARLY, ORD             ACCOUNT NO.:  [id number]   MEDICAL RECORD NO.:  [id number]          PATIENT TYPE:  OUT   LOCATION:  GYN                          FACILITY:  [facility]   PHYSICIAN:  Paola A. Duard Brady, MD    DATE OF BIRTH:  [birth year]   DATE OF CONSULTATION:  05/05/2005  DATE OF DISCHARGE:                                   CONSULTATION   [name] is a 40 year old who underwent laser ablation of the vulva for  VIN 3 in May of 2006. She comes in for evaluation of new condylomatous  changes. I last saw her in July of 2005 at which time she did appear to have  condylomatous type changes in the area of the laser bed that occurred and  she was given Aldara to use. She has been using the Aldara 3 times a week.  She used it for several weeks and then had a period of time where she ran  out of her prescription and was not aware that it could be refilled and did  not use it for a month and she subsequently has used it again for 2 days.  She states that when she does use it she notices what she believes to be an  improvement in the pruritus and other symptoms. She otherwise denies any  significant complaints and is able to tolerate the Aldara well.   PHYSICAL EXAMINATION:  VITAL SIGNS:  Weight 164 pounds, blood pressure  120/72.  GENERAL:  Well-nourished, well-developed female in no acute distress.  PELVIC:  External genitalia is notable for extensive condylomatous changes  involving both labia minora. The labia minora on her left side is almost  completely replaced with condylomatous changes. The right side is more so on  the edge of the labia more so than on the side of the labia. She also has  condylomatous type changes in the deep posterior fourchette more on the left  side near the angle of the vulva and the vagina. None of these areas are  worrisome for invasive carcinoma.   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   PLAN:  She will continue using her Aldara religiously with  compliance for  the next 8 weeks. She will return to see me in 8 weeks. If at that time, she  does not have significant improvement of her lesions, I think that we may  need to switch of 5-FU versus Condylox.      Paola A. Duard Brady, MD  Electronically Signed     PAG/MEDQ  D:  05/05/2005  T:  05/06/2005  Job:  [id number]   cc:   Telford Nab, R.N.  [address]  [place], [place] [address]

## 2010-11-28 NOTE — Consult Note (Signed)
NAMEELMARIE, DEVLIN             ACCOUNT NO.:  [id number]   MEDICAL RECORD NO.:  [id number]          PATIENT TYPE:  OUT   LOCATION:  GYN                          FACILITY:  [facility]   PHYSICIAN:  Paola A. Duard Brady, MD    DATE OF BIRTH:  06-17-1971   DATE OF CONSULTATION:  02/03/2005  DATE OF DISCHARGE:                                   CONSULTATION   DATE OF CONSULTATION:  February 03, 2005.   Mrs. Baumbach is a very pleasant 40 year old who underwent extensive laser  ablation of the vulva for VIN3 on May 5.  I last saw her December 23, 2004.  At  that time, it appeared that she had some small 0.5-x-0.5-cm hyperkeratotic,  raised lesions on the left labia majora, consistent with condylomatous-type  changes.  She came in today to see if these lesions are persistent and if so  to continue Aldara therapy.  She is overall doing fairly well.  She is  complaining of some repeat pruritus that is recurring.  She has not really  taken a look at her vulvar area.  She does feel her vulva, and she is  worried that some condyloma may have returned.   PHYSICAL EXAMINATION:  VITAL SIGNS:  Weight 161 pounds.  Blood pressure  118/80.  GENERAL APPEARANCE:  A well-nourished, well-developed female in no acute  distress.  PELVIC:  External genitalia are notable for some condylomatous lesions.  She  does have some white tissue consistent with the laser-ablated area.  She  does use a tanning bed, and there is some tanning and nontanned areas within  the new skin.  However, despite the paler skin, there are some areas of  raised condylomatous-type changes.  There is no evidence of invasive cancer.  These condylomatous-type changes extend from the labia majora with  occasional lesions on the posterior fourchette over to the left side.  There  are occasional small, approximately 0.5-cm lesions or smaller on the labia  minora.   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   PLAN:  I have  instructed her and will have her use Aldara cream 3 times a  week.  She was given a prescription for the above.  She will use this as  directed.  She will call us if she does not tolerate it and return to see me  in 3 months.       PAG/MEDQ  D:  02/04/2005  T:  02/04/2005  Job:  [id number]   cc:   Zenaida Niece, M.D.  [address]  [place]  [place] [address]  Fax: [phone]   Telford Nab, R.N.  [phone] [address]  [place], [place] [address]

## 2010-11-28 NOTE — Op Note (Signed)
[facility]  Patient:    [name], [name] Visit Number: [id number] MRN: [id number]          Service Type: EMS Location: [facility] Attending Physician:  Corlis Leak. Dictated by:   [place] Daphine Deutscher, M.D. Proc. Date: 05/13/01 Admit Date:  05/13/2001 Discharge Date: 05/13/2001   CC:         Verlin Grills, M.D.   Operative Report  PREOPERATIVE INDICATIONS:  This is a 40 year old white female with about a 14-hour history of unrelenting right upper quadrant abdominal pain and an ultrasound consistent with acute cholecystitis.  POSTOPERATIVE DIAGNOSIS:  Acute cholecystitis.  PROCEDURE:  Laparoscopic cholecystectomy with intraoperative cholangiogram.  SURGEON:  Thornton Park. Daphine Deutscher, M.D.  ASSISTANT:  Angelia Mould. Derrell Lolling, M.D.  ANESTHESIA:  General endotracheal.  DESCRIPTION OF PROCEDURE:  Ms. Bossi was taken to room #1 and given general anesthesia.  The abdomen was prepped with Betadine and draped sterilely.  A longitudinal incision was made down into the umbilicus and through a pursestring suture, the Hasson cannula was placed.  The abdomen was inflated with gas and then three other trocars were placed in the upper abdomen.  The gallbladder was quite tense and required decompression.  It was grasped and elevated.  She had a stone impacted in her cystic duct and then there was a marked amount of edema in the infundibulum.  I put a clip on the gallbladder and incised the cystic duct and did a dynamic cholangiogram and this showed filling of the common duct and retrograde filling up in the intrahepatic ducts and then free flow into the duodenum.  The cystic duct was then triple-clipped and divided.  The cystic artery was triple-clipped and divided.  The gallbladder was removed with a hook without incident.  It was placed in a bag and brought out through the umbilicus.  The gallbladder bed was inspected and no bleeding or bile leaks were seen.   Two liters of irrigation were used to irrigate the area.  The incision sites looked okay.  The umbilical port was tied down under direct vision and looked good.  The wounds were injected with 0.5% Marcaine.  The abdomen was then deflated and the skin was closed with 4-0 Vicryl subcutaneously with Benzoin and Steri-Strips.  The patient seemed to tolerate the procedure well and was taken to the recovery room in satisfactory condition. Dictated by:   Thornton Park Daphine Deutscher, M.D. Attending Physician:  Corlis Leak DD:  05/13/01 TD:  05/16/01 Job: [id number]

## 2010-11-28 NOTE — Consult Note (Signed)
NAME:  [name], [name]             ACCOUNT NO.:  [id number]   MEDICAL RECORD NO.:  [id number]          PATIENT TYPE:  OUT   LOCATION:  GYN                          FACILITY:  [facility]   PHYSICIAN:  Paola A. Duard Brady, MD    DATE OF BIRTH:  Jun 27, 1971   DATE OF CONSULTATION:  11/04/2004  DATE OF DISCHARGE:                                   CONSULTATION   REQUESTING PHYSICIAN:  Zenaida Niece, M.D.   Patient is seen today in consultation at the request of Dr. Jackelyn Knife.   [name] is a 40 year old gravida 2, para 2 who has a history of cervical  and vulvar dysplasia in the distant past.  In 1999, she underwent laser  conization for severe dysplasia of the cervix.  She has had normal Pap  smears since that time.  Per her report, her last normal Pap smear was in  July, 2005.  She states that prior to her laser conization of the cervix,  she had undergone laser ablation of the vulva in the past.  She states that  she was seen in the emergency room in February, 2006 for a ruptured ovarian  cyst.  She was seen by Dr. Jackelyn Knife in followup, and multiple condyloma  were noted.  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  For this  reason, she is referred to use today.  She states that she has had these  dark areas on the vulva for approximately 5-6 years.  She thought it  secondary to her having laser in the same area, and this was normal.  She  has had pruritus and irritation before her cycles for about this period of  time.  Her husband has noticed the lesions.  She cannot really state whether  they have gotten any worse in the recent history.  She is interested in  having a tubal ligation as well as a hemorrhoidectomy performed and is  interested in considering whether or not that can be accomplished with the  same anesthetic event.   PAST MEDICAL HISTORY:  None.   PAST SURGICAL HISTORY:  1.  Laparoscopic cholecystectomy.  2.  Mandibular surgery.  3.  Two vaginal  deliveries.   MEDICATIONS:  Ovcon 50.   ALLERGIES:  None.   FAMILY HISTORY:  Noncontributory.   SOCIAL HISTORY:  She is married.  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  She smokes a pack and a half per day.  She has done so for 18  years.  She denies the use of alcohol.   PAST GYN HISTORY:  Two spontaneous vaginal deliveries.  Her children are  ages 55 and 91.  She has had a right ovarian cyst, laser of her CIN and VIN.   PHYSICAL EXAMINATION:  VITAL SIGNS:  Height 5 foot 2.  Weight 163 pounds.  Blood pressure 116/68, pulse 72, respirations 18.  GENERAL:  A well-developed and well-nourished female in no acute distress.  PELVIC:  Groins are negative for adenopathy.  External genitalia is notable  for multiple hyperkeratotic, hyperpigmented, raised condylomatous-type  lesions throughout the vulva,  extending from approximately 2 o'clock to 10  o'clock, encompassing the labia majora, the posterior fourchette, the  introitus, and the labia minora bilaterally.  Biopsy sites with suture  material are noted.  Sutures were trimmed.  These lesions do not appear  invasive.  The entire lesion is seen, extending just into the vagina.  She  does not have marked external hemorrhoids.  The patient was shown the  lesions today with a mirror.   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  She wishes to have her surgery as soon as possible and needs to have  it done prior to my return or any operative time available in [place],  and she would like to have it done in [place].  I discussed with her  that I do not perform tubal ligations, that I am an oncologist, and I also  do not perform hemorrhoidectomies.  I do not recommend proceeding with a  hemorrhoidectomy at the time of this extensive vulvar lasering, as I think  it would be too significant of a recuperation.  She does agree to having the  procedures done in a staged fashion under separate  anesthetic event.  Laser  vulvar ablation was discussed with the patient.  She does understand that  she will have approximately two weeks of recovery, and there is significant  pain associated with it.  She also understands that there is a significant  recurrence rate of her dysplasia.  We will perform biopsies under anesthesia  prior to the laser to rule out any possibility of any invasive carcinoma.   I will contact the patient tomorrow with surgical options and dates at [facility].      PAG/MEDQ  D:  11/04/2004  T:  11/04/2004  Job:  [id number]   cc:   Zenaida Niece, M.D.  [address]  [place]  [place] [address]  Fax: [phone]   Telford Nab, R.N.  [phone] [address]  [place], [place] [address]

## 2010-11-28 NOTE — Consult Note (Signed)
NAMECHRISTANA, [name]             ACCOUNT NO.:  [id number]   MEDICAL RECORD NO.:  [id number]          PATIENT TYPE:  OUT   LOCATION:  GYN                          FACILITY:  [facility]   PHYSICIAN:  Paola A. Duard Brady, MD    DATE OF BIRTH:  Mar 31, 1971   DATE OF CONSULTATION:  12/23/2004  DATE OF DISCHARGE:                                   CONSULTATION   HISTORY OF PRESENT ILLNESS:  Ms. Portee is a very pleasant 40 year old who  underwent extensive laser ablation of the vulva for VIN-3 on May 5.  I last  saw her on May 30 at which time she had almost complete granulation with  normal skin formation.  She comes in today for follow-up.  She states that  she has stopped using the Silvadene cream and the pruritus she has  experienced has subsequently stopped.  She is applying Vitamin E ointment to  the area and continues to pat dry as rubbing still causes irritation and  aggravation.  She is otherwise doing fairly well.   PHYSICAL EXAMINATION:  There has been significant healing since I last saw  her two weeks ago.  There are only a few small, scattered areas of continued  granulation tissue.  The remainder of the tissue has reepithelialized.  There does appear to be a small 0.5 x 0.5 cm hyperkeratotic, raised lesion  on the left labia major consistent with persistent condylomatous changes or  VIN.  Similarly in the posterior portion of the right labia majora near her  prior biopsy site, there appears to be questionable hyperkeratotic raised  changes that could be consistent with persistent disease; however, it is  fairly recent and the healing process is continuing, so it is difficult to  confirm.   ASSESSMENT:  A 40 year old status post extensive laser ablation for vulvar  dysplasia.   PLAN:  1.  Plan is to return to see [place] in four weeks.  At that time if these areas      are persistent and continue to look like condylomatous type changes, we      will begin treatment with Aldara.  2.   The patient was questioning whether or not she is a candidate for the      HPV vaccine.  I discussed with her that as it has currently been FDA      approved it is for the prevention much more than the treatment.       PAG/MEDQ  D:  12/23/2004  T:  12/23/2004  Job:  [id number]

## 2010-11-28 NOTE — Consult Note (Signed)
[name], [name]             ACCOUNT NO.:  [id number]   MEDICAL RECORD NO.:  [id number]          PATIENT TYPE:  OUT   LOCATION:  GYN                          [facility]ACILITY[facility]   PHYSICIAN:  Paola A. Duard Brady, MD    DATE OF BIRTH:  1971-02-11   DATE OF CONSULTATION:  12/09/2004  DATE OF DISCHARGE:                                   CONSULTATION   HISTORY OF PRESENT ILLNESS:  Ms. Papania is a very pleasant 40 year old who  underwent extensive laser ablation of the vulva for VIN-3 on May 5. She was  last seen by me on Nov 25, 2004. At that time the area was healing well, and  she comes in today for follow-up. She is overall doing quite well. The area  is healing very well. The biggest issue she has is some pruritus in a U-  shaped formation above the area of laser encompassing the mons and the  periclitoral regions. She otherwise is doing well from a pain standpoint and  doing her sitz baths.   PHYSICAL EXAMINATION:  External genitalia is notable for significant healing  since her last visit 2 weeks ago. The area is completely almost granulated  in with normal skin formation. There is evidence of scratching superior to  the clitoris in the periclitoral and periurethral tissues. There is no  obvious evidence of a yeast infection though there is a thin white  discharge. It is difficult to separate if the discharge is related to  Silvadene cream versus a possible candidal infection.   ASSESSMENT:  A 40 year old status post extensive laser ablation for vaginal  intraepithelial neoplasia grade 3.   PLAN:  1. She will continue using her sitz baths and Silvadene on a p.r.n. basis.  2. She was given a prescription for Diflucan 150 mg one p.o. today.  3. She will return to see me on December 23, 2004.        PAG/MEDQ  D:  12/09/2004  T:  12/09/2004  Job:  [id number]   cc:   Zenaida Niece, M.D.  [address]  [place]  [place] [address]  Fax: [phone]   Telford Nab, R.N.  [phone] [address]  [place], [place] [address]

## 2010-12-22 ENCOUNTER — Other Ambulatory Visit (HOSPITAL_COMMUNITY): Payer: Self-pay | Admitting: Sports Medicine

## 2010-12-22 DIAGNOSIS — M069 Rheumatoid arthritis, unspecified: Secondary | ICD-10-CM

## 2010-12-31 ENCOUNTER — Encounter (HOSPITAL_COMMUNITY)
Admission: RE | Admit: 2010-12-31 | Discharge: 2010-12-31 | Disposition: A | Discharge status: Home / Self Care | Payer: BC Managed Care – PPO | Source: Ambulatory Visit | Attending: Sports Medicine | Admitting: Sports Medicine

## 2010-12-31 ENCOUNTER — Encounter (HOSPITAL_COMMUNITY): Payer: Self-pay

## 2010-12-31 DIAGNOSIS — M069 Rheumatoid arthritis, unspecified: Secondary | ICD-10-CM

## 2010-12-31 DIAGNOSIS — M2550 Pain in unspecified joint: Secondary | ICD-10-CM | POA: Insufficient documentation

## 2010-12-31 HISTORY — DX: Pain in unspecified joint: M25.50

## 2010-12-31 MED ORDER — TECHNETIUM TC 99M MEDRONATE IV KIT
23.7000 | PACK | Freq: Once | INTRAVENOUS | Status: AC | PRN
  Administered 2010-12-31: 23.7 via INTRAVENOUS

## 2011-08-28 DIAGNOSIS — M7918 Myalgia, other site: Secondary | ICD-10-CM | POA: Insufficient documentation

## 2012-11-16 ENCOUNTER — Ambulatory Visit (INDEPENDENT_AMBULATORY_CARE_PROVIDER_SITE_OTHER): Payer: BC Managed Care – PPO

## 2012-11-16 ENCOUNTER — Ambulatory Visit (INDEPENDENT_AMBULATORY_CARE_PROVIDER_SITE_OTHER): Payer: BC Managed Care – PPO | Admitting: Neurology

## 2012-11-16 DIAGNOSIS — Z0289 Encounter for other administrative examinations: Secondary | ICD-10-CM

## 2012-11-16 DIAGNOSIS — G5600 Carpal tunnel syndrome, unspecified upper limb: Secondary | ICD-10-CM

## 2012-11-16 DIAGNOSIS — R209 Unspecified disturbances of skin sensation: Secondary | ICD-10-CM

## 2012-11-16 NOTE — Procedures (Signed)
History: 42 years old Caucasian female, with chronic low back pain, Bilateral lower extremity numbness with prolonged sitting, also bilateral hands paresthesia, joint swelling.  Examination: Mild bilateral hands multiple joint swelling, mild weakness in bilateral abductor pollicis brevis, opponents, bilateral lower extremity muscle strength was normal. Bilateral wrist Tinel signs were present   Nerve conduction study: Bilateral ulnar sensory and motor responses were normal. Bilateral median sensory response showed mildly prolonged peak latency, with normal snap amplitude, bilateral median motor responses showed mild to moderately prolonged distal latency, with normal C. map amplitude, conduction velocity,  Right peroneal sensory response was normal. Right peroneal to EDB, and tibial motor responses were normal  Electromyography:  Selected needle examination was performed at right upper extremity, right lower extremity right cervical paraspinal, and the right lumbosacral paraspinal muscles  Right abductor pollicis brevis: Normally insertion activity, no spontaneous activity, normal morphology motor unit potential, mildly decreased recruitment patterns.  Needle examination of the right pronator teres, biceps, triceps, deltoid was normal.  Needle examination of right tibialis anterior, tibialis posterior, medial gastrocnemius, vastus lateralis was normal.  There was no spontaneous activity at right lumbosacral paraspinal muscles, right L4, 5 . S1 .  In conclusion:   This is an abnormal study. 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 There was no evidence of right cervical radiculopathy, or right lumbosacral radiculopathy.

## 2013-01-05 DIAGNOSIS — M2550 Pain in unspecified joint: Secondary | ICD-10-CM | POA: Insufficient documentation

## 2016-01-08 ENCOUNTER — Other Ambulatory Visit: Payer: Self-pay | Admitting: Orthopedic Surgery

## 2016-01-08 DIAGNOSIS — M5412 Radiculopathy, cervical region: Secondary | ICD-10-CM

## 2016-01-09 ENCOUNTER — Other Ambulatory Visit: Payer: BC Managed Care – PPO

## 2016-01-20 ENCOUNTER — Other Ambulatory Visit: Payer: Self-pay | Admitting: Plastic Surgery

## 2016-01-20 DIAGNOSIS — Z1231 Encounter for screening mammogram for malignant neoplasm of breast: Secondary | ICD-10-CM

## 2016-01-23 ENCOUNTER — Ambulatory Visit
Admission: RE | Admit: 2016-01-23 | Discharge: 2016-01-23 | Disposition: A | Discharge status: Home / Self Care | Payer: BC Managed Care – PPO | Source: Ambulatory Visit | Attending: Plastic Surgery | Admitting: Plastic Surgery

## 2016-01-23 DIAGNOSIS — Z1231 Encounter for screening mammogram for malignant neoplasm of breast: Secondary | ICD-10-CM

## 2016-11-04 ENCOUNTER — Other Ambulatory Visit: Payer: Self-pay | Admitting: Orthopaedic Surgery

## 2016-11-04 DIAGNOSIS — M5093 Cervical disc disorder, unspecified, cervicothoracic region: Secondary | ICD-10-CM

## 2018-09-27 ENCOUNTER — Emergency Department (HOSPITAL_COMMUNITY)
Admission: EM | Admit: 2018-09-27 | Discharge: 2018-09-27 | Disposition: A | Discharge status: Home / Self Care | Payer: BC Managed Care – PPO | Attending: Emergency Medicine | Admitting: Emergency Medicine

## 2018-09-27 ENCOUNTER — Other Ambulatory Visit: Payer: Self-pay

## 2018-09-27 ENCOUNTER — Encounter (HOSPITAL_COMMUNITY): Payer: Self-pay | Admitting: Emergency Medicine

## 2018-09-27 DIAGNOSIS — R040 Epistaxis: Secondary | ICD-10-CM | POA: Diagnosis present

## 2018-09-27 MED ORDER — OXYMETAZOLINE HCL 0.05 % NA SOLN: 1.0000 | Freq: Two times a day (BID) | NASAL | 0 refills | Status: DC

## 2018-09-27 MED ORDER — AYR SALINE NASAL NO-DRIP NA GEL: NASAL | 0 refills | Status: DC
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
## 2018-09-27 NOTE — Discharge Instructions (Addendum)
You were evaluated today for nosebleed. I did not see active bleeding appointment.  I have written you prescription for a AYR gel as well as afrin.  Please take as prescribed.  I would also suggest following up with ear nose and throat.  Return to the ED for any new or worsening symptoms.
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
## 2019-07-23 NOTE — Progress Notes (Deleted)
No chief complaint on file.     History of Present Illness: 49 yo female with history of anxiety, GERD, ADHD, fibromyalgia, RA, hyperlipidemia and tobacco use here today as a new consult, referred by Dr. ***, for evaluation of ***.   Primary Care Physician: [name], MD   Past Medical History:  Diagnosis Date  . Anxiety   . Arthralgia   . Attention-deficit hyperactivity disorder, predominantly inattentive type   . Fear of flying   . Fibromyalgia   . Gastroesophageal reflux disease   . HPV in female   . Joint pain   . Localized edema   . OA (osteoarthritis)   . Papillomavirus as the cause of diseases classified elsewhere   . Primary insomnia   . Pure hypercholesterolemia   . Rheumatoid arthritis, seronegative, hand, unspecified laterality ([place])   . Tobacco dependence   . Vitamin B12 deficiency anemia     No past surgical history on file.  Current Outpatient Medications  Medication Sig Dispense Refill  . albuterol (VENTOLIN HFA) 108 (90 Base) MCG/ACT inhaler Inhale 2 puffs into the lungs every 4 (four) hours as needed for wheezing or shortness of breath.    . amphetamine-dextroamphetamine (ADDERALL XR) 30 MG 24 hr capsule Take two tablets by mouth daily    . Ayr Saline Nasal No-Drip GEL Place prescription gel on Q-tip in place to inside of left and right nares. 22 mL 0  . Calcium Citrate-Vitamin D (CALCIUM + D PO) Take by mouth.    . cholecalciferol (VITAMIN D3) 25 MCG (1000 UT) tablet Take 1,000 Units by mouth daily.    . Cyanocobalamin (VITAMIN B 12 PO) Take 1,000 mg by mouth daily.    Marland Kitchen escitalopram (LEXAPRO) 10 MG tablet Take 10 mg by mouth daily.    Marland Kitchen gabapentin (NEURONTIN) 100 MG capsule Take 100 mg by mouth 3 (three) times daily.    Marland Kitchen omeprazole (PRILOSEC) 20 MG capsule Take 20 mg by mouth daily.    Marland Kitchen oxymetazoline (AFRIN NASAL SPRAY) 0.05 % nasal spray Place 1 spray into both nostrils 2 (two) times daily. 30 mL 0  . podofilox (CONDYLOX) 0.5 % external  solution Apply topically 2 (two) times daily.    Marland Kitchen zolpidem (AMBIEN) 10 MG tablet Take 10 mg by mouth at bedtime as needed for sleep.     No current facility-administered medications for this visit.    Allergies  Allergen Reactions  . Zoloft [Sertraline]     Social History   Socioeconomic History  . Marital status: Married    Spouse name: Not on file  . Number of children: Not on file  . Years of education: Not on file  . Highest education level: Not on file  Occupational History  . Not on file  Tobacco Use  . Smoking status: Not on file  Substance and Sexual Activity  . Alcohol use: Not on file  . Drug use: Not on file  . Sexual activity: Not on file  Other Topics Concern  . Not on file  Social History Narrative  . Not on file   Social Determinants of Health   Financial Resource Strain:   . Difficulty of Paying Living Expenses: Not on file  Food Insecurity:   . Worried About Charity fundraiser in the Last Year: Not on file  . Ran Out of Food in the Last Year: Not on file  Transportation Needs:   . Lack of Transportation (Medical): Not on file  . Lack  of Transportation (Non-Medical): Not on file  Physical Activity:   . Days of Exercise per Week: Not on file  . Minutes of Exercise per Session: Not on file  Stress:   . Feeling of Stress : Not on file  Social Connections:   . Frequency of Communication with Friends and Family: Not on file  . Frequency of Social Gatherings with Friends and Family: Not on file  . Attends Religious Services: Not on file  . Active Member of Clubs or Organizations: Not on file  . Attends Banker Meetings: Not on file  . Marital Status: Not on file  Intimate Partner Violence:   . Fear of Current or Ex-Partner: Not on file  . Emotionally Abused: Not on file  . Physically Abused: Not on file  . Sexually Abused: Not on file    No family history on file.  Review of Systems:  As stated in the HPI and otherwise negative.     There were no vitals taken for this visit.  Physical Examination: General: Well developed, well nourished, NAD  HEENT: OP clear, mucus membranes moist  SKIN: warm, dry. No rashes. Neuro: No focal deficits  Musculoskeletal: Muscle strength 5/5 all ext  Psychiatric: Mood and affect normal  Neck: No JVD, no carotid bruits, no thyromegaly, no lymphadenopathy.  Lungs:Clear bilaterally, no wheezes, rhonci, crackles Cardiovascular: Regular rate and rhythm. No murmurs, gallops or rubs. Abdomen:Soft. Bowel sounds present. Non-tender.  Extremities: No lower extremity edema. Pulses are 2 + in the bilateral DP/PT.  EKG:  EKG {ACTION; IS/IS UEA:[id number]} ordered today. The ekg ordered today demonstrates ***  Recent Labs: No results found for requested labs within last 8760 hours.   Lipid Panel No results found for: CHOL, TRIG, HDL, CHOLHDL, VLDL, LDLCALC, LDLDIRECT   Wt Readings from Last 3 Encounters:  09/27/18 170 lb (77.1 kg)     Other studies Reviewed: Additional studies/ records that were reviewed today include: ***. Review of the above records demonstrates: ***   Assessment and Plan:   1.   Current medicines are reviewed at length with the patient today.  The patient {ACTIONS; HAS/DOES NOT HAVE:19233} concerns regarding medicines.  The following changes have been made:  {PLAN; NO CHANGE:13088:s}  Labs/ tests ordered today include: *** No orders of the defined types were placed in this encounter.    Disposition:   FU with *** in {gen number [id number]} {TIME; UNITS DAY/WEEK/MONTH:19136}   Signed, Verne Carrow, MD 07/23/2019 11:29 AM    [facility] [address] [place], [place], [place]  [address] Phone: [phone]; Fax: [phone]

## 2019-07-24 ENCOUNTER — Ambulatory Visit: Payer: BC Managed Care – PPO | Admitting: Cardiovascular Disease

## 2019-09-15 ENCOUNTER — Encounter (INDEPENDENT_AMBULATORY_CARE_PROVIDER_SITE_OTHER): Payer: Self-pay

## 2019-09-15 ENCOUNTER — Telehealth: Payer: Self-pay | Admitting: Radiology

## 2019-09-15 ENCOUNTER — Other Ambulatory Visit: Payer: Self-pay

## 2019-09-15 ENCOUNTER — Encounter: Payer: Self-pay | Admitting: Cardiovascular Disease

## 2019-09-15 ENCOUNTER — Ambulatory Visit: Payer: BC Managed Care – PPO | Admitting: Cardiovascular Disease

## 2019-09-15 VITALS — BP 116/70 | HR 102 | Ht 62.0 in | Wt 180.0 lb

## 2019-09-15 DIAGNOSIS — R0602 Shortness of breath: Secondary | ICD-10-CM

## 2019-09-15 DIAGNOSIS — R002 Palpitations: Secondary | ICD-10-CM | POA: Diagnosis not present

## 2019-09-15 NOTE — Progress Notes (Signed)
Chief Complaint  Patient presents with  . New Patient (Initial Visit)   History of Present Illness: 49 yo female with history of anxiety, fibromyalgia, GERD, arthritis, HLD, rheumatoid arthritis, tobacco abuse here today as a new patient for the evaluation of her cardiac risk factors. She has had palpitations at home at home. She feels like her heart skips a beat. She has had dyspnea with exertion. No chest pain. She is very anxious. She smokes 1 ppd.   Primary Care Physician: [name], MD  Past Medical History:  Diagnosis Date  . Anxiety   . Arthralgia   . Attention-deficit hyperactivity disorder, predominantly inattentive type   . Fear of flying   . Fibromyalgia   . Gastroesophageal reflux disease   . HPV in female   . Joint pain   . Localized edema   . OA (osteoarthritis)   . Papillomavirus as the cause of diseases classified elsewhere   . Primary insomnia   . Pure hypercholesterolemia   . Rheumatoid arthritis, seronegative, hand, unspecified laterality ([place])   . Tobacco dependence   . Vitamin B12 deficiency anemia     Past Surgical History:  Procedure Laterality Date  . CESAREAN SECTION WITH BILATERAL TUBAL LIGATION    . CHOLECYSTECTOMY    . FOOT SURGERY    . MANDIBLE SURGERY      Current Outpatient Medications  Medication Sig Dispense Refill  . albuterol (VENTOLIN HFA) 108 (90 Base) MCG/ACT inhaler Inhale 2 puffs into the lungs every 4 (four) hours as needed for wheezing or shortness of breath.    . amphetamine-dextroamphetamine (ADDERALL XR) 30 MG 24 hr capsule Take two tablets by mouth daily    . cholecalciferol (VITAMIN D3) 25 MCG (1000 UT) tablet Take 1,000 Units by mouth daily.    . Cyanocobalamin (VITAMIN B 12 PO) Take 1,000 mg by mouth daily.    Marland Kitchen escitalopram (LEXAPRO) 10 MG tablet Take 10 mg by mouth daily.    Marland Kitchen gabapentin (NEURONTIN) 100 MG capsule Take 100 mg by mouth 3 (three) times daily.    . meloxicam (MOBIC) 15 MG tablet Take by mouth.    Marland Kitchen  omeprazole (PRILOSEC) 20 MG capsule Take 20 mg by mouth daily.    . podofilox (CONDYLOX) 0.5 % external solution Apply topically 2 (two) times daily.    Marland Kitchen zolpidem (AMBIEN) 10 MG tablet Take 10 mg by mouth at bedtime as needed for sleep.     No current facility-administered medications for this visit.    Allergies  Allergen Reactions  . Zoloft [Sertraline]     Social History   Socioeconomic History  . Marital status: Married    Spouse name: Not on file  . Number of children: 2  . Years of education: Not on file  . Highest education level: Not on file  Occupational History  . Occupation: Works at home  Tobacco Use  . Smoking status: Current Every Day Smoker    Packs/day: 1.00    Types: Cigarettes  . Smokeless tobacco: Never Used  Substance and Sexual Activity  . Alcohol use: Yes    Comment: occasional  . Drug use: Not on file  . Sexual activity: Not on file  Other Topics Concern  . Not on file  Social History Narrative  . Not on file   Social Determinants of Health   Financial Resource Strain:   . Difficulty of Paying Living Expenses: Not on file  Food Insecurity:   . Worried About [employer] of  Food in the Last Year: Not on file  . Ran Out of Food in the Last Year: Not on file  Transportation Needs:   . Lack of Transportation (Medical): Not on file  . Lack of Transportation (Non-Medical): Not on file  Physical Activity:   . Days of Exercise per Week: Not on file  . Minutes of Exercise per Session: Not on file  Stress:   . Feeling of Stress : Not on file  Social Connections:   . Frequency of Communication with Friends and Family: Not on file  . Frequency of Social Gatherings with Friends and Family: Not on file  . Attends Religious Services: Not on file  . Active Member of Clubs or Organizations: Not on file  . Attends Banker Meetings: Not on file  . Marital Status: Not on file  Intimate Partner Violence:   . Fear of Current or Ex-Partner: Not  on file  . Emotionally Abused: Not on file  . Physically Abused: Not on file  . Sexually Abused: Not on file    Family History  Problem Relation Age of Onset  . Hypertension Father   . Heart disease Father     Review of Systems:  As stated in the HPI and otherwise negative.   BP 116/70   Pulse (!) 102   Ht 5\' 2"  (1.575 m)   Wt 180 lb (81.6 kg)   SpO2 99%   BMI 32.92 kg/m   Physical Examination: General: Well developed, well nourished, NAD  HEENT: OP clear, mucus membranes moist  SKIN: warm, dry. No rashes. Neuro: No focal deficits  Musculoskeletal: Muscle strength 5/5 all ext  Psychiatric: Mood and affect normal  Neck: No JVD, no carotid bruits, no thyromegaly, no lymphadenopathy.  Lungs:Clear bilaterally, no wheezes, rhonci, crackles Cardiovascular: Regular rate and rhythm. No murmurs, gallops or rubs. Abdomen:Soft. Bowel sounds present. Non-tender.  Extremities: No lower extremity edema. Pulses are 2 + in the bilateral DP/PT.  EKG:  EKG is ordered today. The ekg ordered today demonstrates Sinus tachycardia, heart rate 102 bpm  Recent Labs: No results found for requested labs within last 8760 hours.   Lipid Panel No results found for: CHOL, TRIG, HDL, CHOLHDL, VLDL, LDLCALC, LDLDIRECT   Wt Readings from Last 3 Encounters:  09/15/19 180 lb (81.6 kg)  09/27/18 170 lb (77.1 kg)     Assessment and Plan:   1. Palpitations: Will arrange a 14 day Zio monitor  2. Dyspnea on exertion; Will arrange an exercise stress test and an echo.   Current medicines are reviewed at length with the patient today.  The patient does not have concerns regarding medicines.  The following changes have been made:  no change  Labs/ tests ordered today include:   Orders Placed This Encounter  Procedures  . EXERCISE TOLERANCE TEST (ETT)  . LONG TERM MONITOR (3-14 DAYS)  . EKG 12-Lead  . ECHOCARDIOGRAM COMPLETE     Disposition:   FU with me in 6-8 weeks.     Signed, 09/29/18, MD 09/15/2019 10:37 AM    [facility] [address] [place], [place], [place]  [place] Phone: [phone]; Fax: [phone]

## 2019-09-15 NOTE — Telephone Encounter (Signed)
Enrolled patient for a 14 day Zio monitor to be mailed to patients home.  

## 2019-09-15 NOTE — Patient Instructions (Signed)
Medication Instructions:  No changes *If you need a refill on your cardiac medications before your next appointment, please call your pharmacy*   Lab Work: None  Testing/Procedures: Your physician has requested that you have an exercise tolerance test. For further information please visit [email or web address] Please also follow instruction sheet, as given.  Your physician has requested that you have an echocardiogram. Echocardiography is a painless test that uses sound waves to create images of your heart. It provides your doctor with information about the size and shape of your heart and how well your heart's chambers and valves are working. This procedure takes approximately one hour. There are no restrictions for this procedure.  Your physician has recommended that you wear a Zio heart monitor. Heart monitors are medical devices that record the heart's electrical activity. Doctors most often use these monitors to diagnose arrhythmias. Arrhythmias are problems with the speed or rhythm of the heartbeat. The monitor is a small, portable device. You can wear one while you do your normal daily activities. This is usually used to diagnose what is causing palpitations/syncope (passing out).   Follow-Up: At [facility], you and your health needs are our priority.  As part of our continuing mission to provide you with exceptional heart care, we have created designated Provider Care Teams.  These Care Teams include your primary Cardiologist (physician) and Advanced Practice Providers (APPs -  Physician Assistants and Nurse Practitioners) who all work together to provide you with the care you need, when you need it.  We recommend signing up for the patient portal called "MyChart".  Sign up information is provided on this After Visit Summary.  MyChart is used to connect with patients for Virtual Visits (Telemedicine).  Patients are able to view lab/test results, encounter notes, upcoming appointments,  etc.  Non-urgent messages can be sent to your provider as well.   To learn more about what you can do with MyChart, go to [email or web address].    Your next appointment:   6-8 week(s)  The format for your next appointment:   In Person  Provider:   Verne Carrow, MD   Other Instructions

## 2019-09-19 ENCOUNTER — Other Ambulatory Visit (HOSPITAL_COMMUNITY)
Admission: RE | Admit: 2019-09-19 | Discharge: 2019-09-19 | Disposition: A | Discharge status: Home / Self Care | Payer: BC Managed Care – PPO | Source: Ambulatory Visit | Attending: Cardiology | Admitting: Cardiology

## 2019-09-19 ENCOUNTER — Telehealth (HOSPITAL_COMMUNITY): Payer: Self-pay

## 2019-09-19 DIAGNOSIS — Z20822 Contact with and (suspected) exposure to covid-19: Secondary | ICD-10-CM | POA: Insufficient documentation

## 2019-09-19 DIAGNOSIS — Z01812 Encounter for preprocedural laboratory examination: Secondary | ICD-10-CM | POA: Insufficient documentation

## 2019-09-19 LAB — SARS CORONAVIRUS 2 (TAT 6-24 HRS): SARS Coronavirus 2: NEGATIVE

## 2019-09-19 NOTE — Telephone Encounter (Signed)
Encounter complete. 

## 2019-09-20 ENCOUNTER — Telehealth (HOSPITAL_COMMUNITY): Payer: Self-pay | Admitting: *Deleted

## 2019-09-20 NOTE — Telephone Encounter (Signed)
Close encounter 

## 2019-09-21 ENCOUNTER — Telehealth (HOSPITAL_COMMUNITY): Payer: Self-pay

## 2019-09-21 NOTE — Telephone Encounter (Signed)
Encounter complete. 

## 2019-09-22 ENCOUNTER — Other Ambulatory Visit: Payer: Self-pay

## 2019-09-22 ENCOUNTER — Ambulatory Visit (HOSPITAL_COMMUNITY)
Admission: RE | Admit: 2019-09-22 | Discharge: 2019-09-22 | Disposition: A | Discharge status: Home / Self Care | Payer: BC Managed Care – PPO | Source: Ambulatory Visit | Attending: Cardiology | Admitting: Cardiology

## 2019-09-22 DIAGNOSIS — R0602 Shortness of breath: Secondary | ICD-10-CM | POA: Diagnosis not present

## 2019-09-22 DIAGNOSIS — R002 Palpitations: Secondary | ICD-10-CM | POA: Insufficient documentation

## 2019-09-22 LAB — EXERCISE TOLERANCE TEST
Estimated workload: 10.1 METS
Exercise duration (min): 9 min
Exercise duration (sec): 10 s
MPHR: 171 {beats}/min
Peak HR: 153 {beats}/min
Percent HR: 89 %
Rest HR: 104 {beats}/min

## 2019-09-28 ENCOUNTER — Other Ambulatory Visit: Payer: Self-pay

## 2019-09-28 ENCOUNTER — Ambulatory Visit (HOSPITAL_COMMUNITY): Payer: BC Managed Care – PPO | Attending: Cardiovascular Disease

## 2019-09-28 DIAGNOSIS — R0602 Shortness of breath: Secondary | ICD-10-CM | POA: Insufficient documentation

## 2019-09-28 DIAGNOSIS — R002 Palpitations: Secondary | ICD-10-CM | POA: Diagnosis present

## 2019-10-01 ENCOUNTER — Other Ambulatory Visit (INDEPENDENT_AMBULATORY_CARE_PROVIDER_SITE_OTHER): Payer: BC Managed Care – PPO

## 2019-10-01 DIAGNOSIS — R0602 Shortness of breath: Secondary | ICD-10-CM | POA: Diagnosis not present

## 2019-10-01 DIAGNOSIS — R002 Palpitations: Secondary | ICD-10-CM | POA: Diagnosis not present

## 2019-11-02 ENCOUNTER — Ambulatory Visit: Payer: BC Managed Care – PPO | Admitting: Cardiovascular Disease

## 2019-11-02 ENCOUNTER — Encounter: Payer: Self-pay | Admitting: Cardiovascular Disease

## 2019-11-02 ENCOUNTER — Other Ambulatory Visit: Payer: Self-pay

## 2019-11-02 VITALS — BP 138/90 | HR 110 | Ht 62.0 in | Wt 186.0 lb

## 2019-11-02 DIAGNOSIS — R002 Palpitations: Secondary | ICD-10-CM

## 2019-11-02 MED ORDER — DILTIAZEM HCL ER COATED BEADS 120 MG PO CP24: 120.0000 mg | ORAL_CAPSULE | Freq: Every day | ORAL | 3 refills | Status: DC

## 2019-11-02 NOTE — Patient Instructions (Signed)
Medication Instructions:  Your physician has recommended you make the following change in your medication:  1.) start diltiazem (Cardizem CD) 120 mg daily  *If you need a refill on your cardiac medications before your next appointment, please call your pharmacy*   Lab Work: None  If you have labs (blood work) drawn today and your tests are completely normal, you will receive your results only by: Marland Kitchen MyChart Message (if you have MyChart) OR . A paper copy in the mail If you have any lab test that is abnormal or we need to change your treatment, we will call you to review the results.   Testing/Procedures: none   Follow-Up: At [facility], you and your health needs are our priority.  As part of our continuing mission to provide you with exceptional heart care, we have created designated Provider Care Teams.  These Care Teams include your primary Cardiologist (physician) and Advanced Practice Providers (APPs -  Physician Assistants and Nurse Practitioners) who all work together to provide you with the care you need, when you need it. Your next appointment:   12 month(s)  The format for your next appointment:   In Person  Provider:   You may see Verne Carrow, MD or one of the following Advanced Practice Providers on your designated Care Team:    Ronie Spies, PA-C  Jacolyn Reedy, PA-C    Other Instructions

## 2019-11-02 NOTE — Progress Notes (Signed)
Chief Complaint  Patient presents with  . Follow-up    SOB   History of Present Illness: 49 yo female with history of anxiety, fibromyalgia, GERD, arthritis, HLD, rheumatoid arthritis, tobacco abuse here today for cardiac follow up. She was seen as a new patient for the evaluation of her cardiac risk factors in March 2021. She has had palpitations at home at home. She feels like her heart skips a beat. She has had dyspnea with exertion. No chest pain. She is very anxious. She smokes 1 ppd. Normal exercise stress test march 2021. Echo March 2021 with LVEF=50-55%. No valve disease. Cardiac monitor April 2021 with sinus rhythm, 2 short runs of VT (longest 9 beats), 3 short runs of SVt (longest 12 beats).    She is here today for follow up. The patient denies any chest pain, dyspnea, lower extremity edema, orthopnea, PND, dizziness, near syncope or syncope. She has some palpitations. She has severe back pain today.   Primary Care Physician: Johny Blamer, MD  Past Medical History:  Diagnosis Date  . Anxiety   . Arthralgia   . Attention-deficit hyperactivity disorder, predominantly inattentive type   . Fear of flying   . Fibromyalgia   . Gastroesophageal reflux disease   . HPV in female   . Joint pain   . Localized edema   . OA (osteoarthritis)   . Papillomavirus as the cause of diseases classified elsewhere   . Primary insomnia   . Pure hypercholesterolemia   . Rheumatoid arthritis, seronegative, hand, unspecified laterality (HCC)   . Tobacco dependence   . Vitamin B12 deficiency anemia     Past Surgical History:  Procedure Laterality Date  . CESAREAN SECTION WITH BILATERAL TUBAL LIGATION    . CHOLECYSTECTOMY    . FOOT SURGERY    . MANDIBLE SURGERY      Current Outpatient Medications  Medication Sig Dispense Refill  . albuterol (VENTOLIN HFA) 108 (90 Base) MCG/ACT inhaler Inhale 2 puffs into the lungs every 4 (four) hours as needed for wheezing or shortness of breath.    .  amphetamine-dextroamphetamine (ADDERALL XR) 30 MG 24 hr capsule Take two tablets by mouth daily    . cholecalciferol (VITAMIN D3) 25 MCG (1000 UT) tablet Take 1,000 Units by mouth daily.    . Cyanocobalamin (VITAMIN B 12 PO) Take 1,000 mg by mouth daily.    Marland Kitchen escitalopram (LEXAPRO) 10 MG tablet Take 10 mg by mouth daily.    Marland Kitchen gabapentin (NEURONTIN) 100 MG capsule Take 100 mg by mouth 3 (three) times daily.    . meloxicam (MOBIC) 15 MG tablet Take by mouth.    Marland Kitchen omeprazole (PRILOSEC) 20 MG capsule Take 20 mg by mouth daily.    . podofilox (CONDYLOX) 0.5 % external solution Apply topically 2 (two) times daily.    Marland Kitchen zolpidem (AMBIEN) 10 MG tablet Take 10 mg by mouth at bedtime as needed for sleep.    Marland Kitchen diltiazem (CARDIZEM CD) 120 MG 24 hr capsule Take 1 capsule (120 mg total) by mouth daily. 90 capsule 3   No current facility-administered medications for this visit.    Allergies  Allergen Reactions  . Zoloft [Sertraline]     Social History   Socioeconomic History  . Marital status: Married    Spouse name: Not on file  . Number of children: 2  . Years of education: Not on file  . Highest education level: Not on file  Occupational History  . Occupation: Works at home  Tobacco Use  . Smoking status: Current Every Day Smoker    Packs/day: 1.00    Types: Cigarettes  . Smokeless tobacco: Never Used  Substance and Sexual Activity  . Alcohol use: Yes    Comment: occasional  . Drug use: Not on file  . Sexual activity: Not on file  Other Topics Concern  . Not on file  Social History Narrative  . Not on file   Social Determinants of Health   Financial Resource Strain:   . Difficulty of Paying Living Expenses:   Food Insecurity:   . Worried About Charity fundraiser in the Last Year:   . Arboriculturist in the Last Year:   Transportation Needs:   . Film/video editor (Medical):   Marland Kitchen Lack of Transportation (Non-Medical):   Physical Activity:   . Days of Exercise per Week:     . Minutes of Exercise per Session:   Stress:   . Feeling of Stress :   Social Connections:   . Frequency of Communication with Friends and Family:   . Frequency of Social Gatherings with Friends and Family:   . Attends Religious Services:   . Active Member of Clubs or Organizations:   . Attends Archivist Meetings:   Marland Kitchen Marital Status:   Intimate Partner Violence:   . Fear of Current or Ex-Partner:   . Emotionally Abused:   Marland Kitchen Physically Abused:   . Sexually Abused:     Family History  Problem Relation Age of Onset  . Hypertension Father   . Heart disease Father     Review of Systems:  As stated in the HPI and otherwise negative.   BP 138/90   Pulse (!) 110   Ht 5\' 2"  (1.575 m)   Wt 186 lb (84.4 kg)   SpO2 98%   BMI 34.02 kg/m   Physical Examination: General: Well developed, well nourished, NAD  HEENT: OP clear, mucus membranes moist  SKIN: warm, dry. No rashes. Neuro: No focal deficits  Musculoskeletal: Muscle strength 5/5 all ext  Psychiatric: Mood and affect normal  Neck: No JVD, no carotid bruits, no thyromegaly, no lymphadenopathy.  Lungs:Clear bilaterally, no wheezes, rhonci, crackles Cardiovascular: Regular rate and rhythm. No murmurs, gallops or rubs. Abdomen:Soft. Bowel sounds present. Non-tender.  Extremities: No lower extremity edema. Pulses are 2 + in the bilateral DP/PT.  EKG:  EKG is not ordered today. The ekg ordered today demonstrates   Recent Labs: No results found for requested labs within last 8760 hours.   Lipid Panel No results found for: CHOL, TRIG, HDL, CHOLHDL, VLDL, LDLCALC, LDLDIRECT   Wt Readings from Last 3 Encounters:  11/02/19 186 lb (84.4 kg)  09/15/19 180 lb (81.6 kg)  09/27/18 170 lb (77.1 kg)     Assessment and Plan:   1. Palpitations/SVT/PVCs; Rare palpitations. Will start Cardizem CD 120 mg once daily.    2. Dyspnea on exertion: Echo March 3235 normal LV systolic function. No valve disease. Exercise stress  test march 2021 with no ischemia.   Current medicines are reviewed at length with the patient today.  The patient does not have concerns regarding medicines.  The following changes have been made:  no change  Labs/ tests ordered today include:   No orders of the defined types were placed in this encounter.    Disposition:   F/U with me in one year.    Signed, Lauree Chandler, MD 11/02/2019 2:15 PM    [facility] [place], [place], [place]  [address] Phone: [phone]; Fax: [phone]

## 2020-03-20 ENCOUNTER — Telehealth: Payer: Self-pay | Admitting: *Deleted

## 2020-03-20 NOTE — Telephone Encounter (Signed)
° °  [facility] Pre-operative Risk Assessment    HEARTCARE STAFF: - Please ensure there is not already an duplicate clearance open for this procedure. - Under Visit Info/Reason for Call, type in Other and utilize the format Clearance MM/DD/YY or Clearance TBD. Do not use dashes or single digits. - If request is for dental extraction, please clarify the # of teeth to be extracted.  Request for surgical clearance:  1. What type of surgery is being performed? C6-7 Anterior cervical fusion w/left carpal tunnel release   2. When is this surgery scheduled? 04/19/2020   3. What type of clearance is required (medical clearance vs. Pharmacy clearance to hold med vs. Both)? medical  4. Are there any medications that need to be held prior to surgery and how long? None listed   5. Practice name and name of physician performing surgery? [facility]; Dr Eustace Moore   6. What is the office phone number? [phone]   7.   What is the office fax number? [place]   Anesthesia type (None, local, MAC, general) ? General   [name] L 03/20/2020, 3:58 PM  _________________________________________________________________   (provider comments below)

## 2020-03-21 NOTE — Telephone Encounter (Signed)
Called back- unable to leave a message-"memory full".  Corine Shelter PA-C 03/21/2020 10:18 AM

## 2020-03-21 NOTE — Telephone Encounter (Signed)
Pt is returning phone call ° °

## 2020-03-21 NOTE — Telephone Encounter (Signed)
   Primary Cardiologist: Verne Carrow, MD  Chart reviewed and patient contacted today by phone as part of pre-operative protocol coverage. Given past medical history and time since last visit, based on ACC/AHA guidelines, [name] would be at acceptable risk for the planned procedure without further cardiovascular testing.   The patient was advised that if she develops new symptoms prior to surgery to contact our office to arrange for a follow-up visit, and she verbalized understanding.  I will route this recommendation to the requesting party via Epic fax function and remove from pre-op pool.  Please call with questions.  Corine Shelter, PA-C 03/21/2020, 10:38 AM

## 2020-03-21 NOTE — Telephone Encounter (Signed)
Left message to call back  Corine Shelter PA-C 03/21/2020 9:31 AM

## 2020-04-25 ENCOUNTER — Other Ambulatory Visit: Payer: Self-pay | Admitting: Neurological Surgery

## 2020-04-25 DIAGNOSIS — M4317 Spondylolisthesis, lumbosacral region: Secondary | ICD-10-CM

## 2020-05-07 ENCOUNTER — Ambulatory Visit
Admission: RE | Admit: 2020-05-07 | Discharge: 2020-05-07 | Disposition: A | Discharge status: Home / Self Care | Payer: BC Managed Care – PPO | Source: Ambulatory Visit | Attending: Neurological Surgery | Admitting: Neurological Surgery

## 2020-05-07 ENCOUNTER — Other Ambulatory Visit: Payer: Self-pay

## 2020-05-07 DIAGNOSIS — M4317 Spondylolisthesis, lumbosacral region: Secondary | ICD-10-CM

## 2020-06-17 ENCOUNTER — Other Ambulatory Visit: Payer: Self-pay | Admitting: Neurological Surgery

## 2020-06-17 ENCOUNTER — Other Ambulatory Visit: Payer: Self-pay

## 2020-06-19 ENCOUNTER — Other Ambulatory Visit: Payer: Self-pay

## 2020-06-24 NOTE — Progress Notes (Signed)
[id number] [place], [place] [address] [place] [address] Phone: [phone] Fax: [phone]      Your procedure is scheduled on Friday December 17  Report to [facility] Main Entrance "A" at 0530 A.M., and check in at the Admitting office.  Call this number if you have problems the morning of surgery:  [phone]  Call [phone] if you have any questions prior to your surgery date Monday-Friday 8am-4pm    Remember:  Do not eat or drink after midnight the night before your surgery    Take these medicines the morning of surgery with A SIP OF WATER  albuterol (VENTOLIN HFA) if needed, Please bring all inhalers with you the day of surgery.  diltiazem (CARDIZEM CD) escitalopram (LEXAPRO) omeprazole (PRILOSEC)   As of today, STOP taking any Aspirin (unless otherwise instructed by your surgeon) Aleve, Naproxen, Ibuprofen, Motrin, Advil, Goody's, BC's, all herbal medications, fish oil, and all vitamins.                      Do not wear jewelry, make up, or nail polish            Do not wear lotions, powders, perfumes, or deodorant.            Do not shave 48 hours prior to surgery.              Do not bring valuables to the hospital.            [facility] is not responsible for any belongings or valuables.  Do NOT Smoke (Tobacco/Vaping) or drink Alcohol 24 hours prior to your procedure If you use a CPAP at night, you may bring all equipment for your overnight stay.   Contacts, glasses, dentures or bridgework may not be worn into surgery.      For patients admitted to the hospital, discharge time will be determined by your treatment team.   Patients discharged the day of surgery will not be allowed to drive home, and someone needs to stay with them for 24 hours.    Special instructions:   [facility]- Preparing For Surgery  Before surgery, you can play an important role. Because skin is not sterile, your skin needs to be as free of  germs as possible. You can reduce the number of germs on your skin by washing with CHG (chlorahexidine gluconate) Soap before surgery.  CHG is an antiseptic cleaner which kills germs and bonds with the skin to continue killing germs even after washing.    Oral Hygiene is also important to reduce your risk of infection.  Remember - BRUSH YOUR TEETH THE MORNING OF SURGERY WITH YOUR REGULAR TOOTHPASTE  Please do not use if you have an allergy to CHG or antibacterial soaps. If your skin becomes reddened/irritated stop using the CHG.  Do not shave (including legs and underarms) for at least 48 hours prior to first CHG shower. It is OK to shave your face.  Please follow these instructions carefully.   1. Shower the NIGHT BEFORE SURGERY and the MORNING OF SURGERY with CHG Soap.   2. If you chose to wash your hair, wash your hair first as usual with your normal shampoo.  3. After you shampoo, rinse your hair and body thoroughly to remove the shampoo.  4. Use CHG as you would any other liquid soap. You can apply CHG directly to the skin and wash gently with a scrungie or  a clean washcloth.   5. Apply the CHG Soap to your body ONLY FROM THE NECK DOWN.  Do not use on open wounds or open sores. Avoid contact with your eyes, ears, mouth and genitals (private parts). Wash Face and genitals (private parts)  with your normal soap.   6. Wash thoroughly, paying special attention to the area where your surgery will be performed.  7. Thoroughly rinse your body with warm water from the neck down.  8. DO NOT shower/wash with your normal soap after using and rinsing off the CHG Soap.  9. Pat yourself dry with a CLEAN TOWEL.  10. Wear CLEAN PAJAMAS to bed the night before surgery  11. Place CLEAN SHEETS on your bed the night of your first shower and DO NOT SLEEP WITH PETS.   Day of Surgery: Wear Clean/Comfortable clothing the morning of surgery Do not apply any deodorants/lotions.   Remember to brush your  teeth WITH YOUR REGULAR TOOTHPASTE.   Please read over the following fact sheets that you were given.

## 2020-06-25 ENCOUNTER — Ambulatory Visit: Payer: BC Managed Care – PPO | Admitting: Vascular Surgery

## 2020-06-25 ENCOUNTER — Other Ambulatory Visit: Payer: Self-pay

## 2020-06-25 ENCOUNTER — Encounter (HOSPITAL_COMMUNITY)
Admission: RE | Admit: 2020-06-25 | Discharge: 2020-06-25 | Disposition: A | Discharge status: Home / Self Care | Payer: BC Managed Care – PPO | Source: Ambulatory Visit | Attending: Neurological Surgery | Admitting: Neurological Surgery

## 2020-06-25 ENCOUNTER — Encounter (HOSPITAL_COMMUNITY): Payer: Self-pay

## 2020-06-25 ENCOUNTER — Ambulatory Visit (HOSPITAL_COMMUNITY)
Admission: RE | Admit: 2020-06-25 | Discharge: 2020-06-25 | Disposition: A | Discharge status: Home / Self Care | Payer: BC Managed Care – PPO | Source: Ambulatory Visit | Attending: Neurological Surgery | Admitting: Neurological Surgery

## 2020-06-25 ENCOUNTER — Other Ambulatory Visit (HOSPITAL_COMMUNITY)
Admission: RE | Admit: 2020-06-25 | Discharge: 2020-06-25 | Disposition: A | Discharge status: Home / Self Care | Payer: BC Managed Care – PPO | Source: Ambulatory Visit | Attending: Neurological Surgery | Admitting: Neurological Surgery

## 2020-06-25 ENCOUNTER — Encounter: Payer: Self-pay | Admitting: Vascular Surgery

## 2020-06-25 ENCOUNTER — Other Ambulatory Visit: Payer: Self-pay | Admitting: Neurological Surgery

## 2020-06-25 DIAGNOSIS — M5441 Lumbago with sciatica, right side: Secondary | ICD-10-CM | POA: Diagnosis not present

## 2020-06-25 DIAGNOSIS — Z20822 Contact with and (suspected) exposure to covid-19: Secondary | ICD-10-CM | POA: Insufficient documentation

## 2020-06-25 DIAGNOSIS — M4310 Spondylolisthesis, site unspecified: Secondary | ICD-10-CM

## 2020-06-25 DIAGNOSIS — G8929 Other chronic pain: Secondary | ICD-10-CM | POA: Diagnosis not present

## 2020-06-25 DIAGNOSIS — M5442 Lumbago with sciatica, left side: Secondary | ICD-10-CM

## 2020-06-25 DIAGNOSIS — Z01812 Encounter for preprocedural laboratory examination: Secondary | ICD-10-CM | POA: Insufficient documentation

## 2020-06-25 LAB — CBC WITH DIFFERENTIAL/PLATELET
Abs Immature Granulocytes: 0.03 10*3/uL (ref 0.00–0.07)
Basophils Absolute: 0.1 10*3/uL (ref 0.0–0.1)
Basophils Relative: 1 %
Eosinophils Absolute: 0 10*3/uL (ref 0.0–0.5)
Eosinophils Relative: 1 %
HCT: 44.1 % (ref 36.0–46.0)
Hemoglobin: 14.1 g/dL (ref 12.0–15.0)
Immature Granulocytes: 0 %
Lymphocytes Relative: 11 %
Lymphs Abs: 1 10*3/uL (ref 0.7–4.0)
MCH: 29.8 pg (ref 26.0–34.0)
MCHC: 32 g/dL (ref 30.0–36.0)
MCV: 93.2 fL (ref 80.0–100.0)
Monocytes Absolute: 0.2 10*3/uL (ref 0.1–1.0)
Monocytes Relative: 3 %
Neutro Abs: 7.3 10*3/uL (ref 1.7–7.7)
Neutrophils Relative %: 84 %
Platelets: 255 10*3/uL (ref 150–400)
RBC: 4.73 MIL/uL (ref 3.87–5.11)
RDW: 13.7 % (ref 11.5–15.5)
WBC: 8.6 10*3/uL (ref 4.0–10.5)
nRBC: 0 % (ref 0.0–0.2)

## 2020-06-25 LAB — BASIC METABOLIC PANEL
Anion gap: 8 (ref 5–15)
BUN: 7 mg/dL (ref 6–20)
CO2: 28 mmol/L (ref 22–32)
Calcium: 9.3 mg/dL (ref 8.9–10.3)
Chloride: 99 mmol/L (ref 98–111)
Creatinine, Ser: 0.73 mg/dL (ref 0.44–1.00)
GFR, Estimated: >60 mL/min (ref >60)
Glucose, Bld: 115 mg/dL — ABNORMAL HIGH (ref 70–99)
Potassium: 4.8 mmol/L (ref 3.5–5.1)
Sodium: 135 mmol/L (ref 135–145)

## 2020-06-25 LAB — SURGICAL PCR SCREEN
MRSA, PCR: NEGATIVE
Staphylococcus aureus: NEGATIVE

## 2020-06-25 LAB — PROTIME-INR
INR: 1 (ref 0.8–1.2)
Prothrombin Time: 12.8 seconds (ref 11.4–15.2)

## 2020-06-25 LAB — SARS CORONAVIRUS 2 (TAT 6-24 HRS): SARS Coronavirus 2: NEGATIVE

## 2020-06-25 LAB — TYPE AND SCREEN
ABO/RH(D): O POS
Antibody Screen: NEGATIVE

## 2020-06-25 NOTE — Progress Notes (Signed)
PCP - Dr. Tiburcio Pea Cardiologist - Dr. Clifton James  PPM/ICD -  Device Orders -  Rep Notified -   Chest x-ray - 06/25/2020 EKG - 06/25/2020 Stress Test - 09/22/2019 ECHO - 09/28/2019 Cardiac Cath - patient denies  Sleep Study - patient denies CPAP -   Fasting Blood Sugar - n/a Checks Blood Sugar _____ times a day  Blood Thinner Instructions: n/a Aspirin Instructions:  ERAS Protcol - n/a; NPO after midnight PRE-SURGERY Ensure or G2-   COVID TEST-  After PAT appointment   Anesthesia review: yes, for history of cardiac testing.  Patient denies shortness of breath, fever, cough and chest pain at PAT appointment   All instructions explained to the patient, with a verbal understanding of the material. Patient agrees to go over the instructions while at home for a better understanding. Patient also instructed to self quarantine after being tested for COVID-19. The opportunity to ask questions was provided.

## 2020-06-25 NOTE — Progress Notes (Signed)
Patient name: [name] MRN: [id number] DOB: [birth year] Sex: female  REASON FOR CONSULT: Evaluate for L5-S1 ALIF  HPI: [name] is a 49 y.o. female, with history of anxiety that presents for preop evaluation of L5-S1 ALIF.  Patient reports at least 10 to 20 years of lower back pain with bilateral lower extremity radiculopathy.  She has been evaluated by Dr. Yetta Barre with neurosurgery and has failed conservative management and he has now recommended an L5-S1 from an anterior approach.  She states she used to be able to drive in the car for up to 4.5 hours before her legs went numb and now she only can drive for a couple hours.  Previous abdominal surgery includes laparoscopic cholecystectomy and tubal ligation.  Previously has a 3 level ACDF with Dr. Yetta Barre with hoarseness that is now resolved.  Past Medical History:  Diagnosis Date  . Anxiety   . Arthralgia   . Attention-deficit hyperactivity disorder, predominantly inattentive type   . Back pain   . Fear of flying   . Fibromyalgia   . Gastroesophageal reflux disease   . HPV in female   . Joint pain   . Localized edema   . Neck pain   . OA (osteoarthritis)   . Papillomavirus as the cause of diseases classified elsewhere   . Primary insomnia   . Pure hypercholesterolemia   . Rheumatoid arthritis, seronegative, hand, unspecified laterality (HCC)   . Spondylolisthesis of lumbosacral region   . Tobacco dependence   . Unspecified inflammatory spondylopathy, cervical region (HCC)   . Vitamin B12 deficiency anemia   . Voice hoarseness     Past Surgical History:  Procedure Laterality Date  . CESAREAN SECTION WITH BILATERAL TUBAL LIGATION    . CHOLECYSTECTOMY    . FOOT SURGERY    . MANDIBLE SURGERY      Family History  Problem Relation Age of Onset  . Hypertension Father   . Heart disease Father     SOCIAL HISTORY: Social History   Socioeconomic History  . Marital status: Married    Spouse name: Not on file   . Number of children: 2  . Years of education: Not on file  . Highest education level: Not on file  Occupational History  . Occupation: Works at home  Tobacco Use  . Smoking status: Current Every Day Smoker    Packs/day: 1.00    Types: Cigarettes  . Smokeless tobacco: Never Used  Substance and Sexual Activity  . Alcohol use: Yes    Comment: occasional  . Drug use: Not on file  . Sexual activity: Not on file  Other Topics Concern  . Not on file  Social History Narrative  . Not on file   Social Determinants of Health   Financial Resource Strain: Not on file  Food Insecurity: Not on file  Transportation Needs: Not on file  Physical Activity: Not on file  Stress: Not on file  Social Connections: Not on file  Intimate Partner Violence: Not on file    Allergies  Allergen Reactions  . Zoloft [Sertraline]     Altered mental state    Current Outpatient Medications  Medication Sig Dispense Refill  . albuterol (VENTOLIN HFA) 108 (90 Base) MCG/ACT inhaler Inhale 2 puffs into the lungs every 4 (four) hours as needed for wheezing or shortness of breath.    . amphetamine-dextroamphetamine (ADDERALL XR) 30 MG 24 hr capsule Take 60 mg by mouth daily.    Marland Kitchen aspirin-acetaminophen-caffeine (  EXCEDRIN MIGRAINE) 250-250-65 MG tablet Take 2 tablets by mouth every 6 (six) hours as needed for headache.    . cholecalciferol (VITAMIN D3) 25 MCG (1000 UT) tablet Take 1,000 Units by mouth daily.    . [employer] VITAMIN B12 1000 MCG tablet Take 1,000 mcg by mouth daily.    . diphenhydrAMINE (BENADRYL) 25 MG tablet Take 25 mg by mouth every 6 (six) hours as needed for allergies.    Marland Kitchen escitalopram (LEXAPRO) 10 MG tablet Take 10 mg by mouth daily as needed (anxiety).    . gabapentin (NEURONTIN) 100 MG capsule Take 100-600 mg by mouth 4 (four) times daily as needed (pain).    Marland Kitchen gabapentin (NEURONTIN) 300 MG capsule Take 600 mg by mouth at bedtime.    Marland Kitchen omeprazole (PRILOSEC) 20 MG capsule Take 20 mg by mouth  daily.    . podofilox (CONDYLOX) 0.5 % external solution Apply 1 application topically once a week.    . zolpidem (AMBIEN) 10 MG tablet Take 10 mg by mouth at bedtime.    Marland Kitchen diltiazem (CARDIZEM CD) 120 MG 24 hr capsule Take 1 capsule (120 mg total) by mouth daily. (Patient not taking: Reported on 06/25/2020) 90 capsule 3   No current facility-administered medications for this visit.    REVIEW OF SYSTEMS:  [X]  denotes positive finding, [ ]  denotes negative finding Cardiac  Comments:  Chest pain or chest pressure:    Shortness of breath upon exertion:    Short of breath when lying flat:    Irregular heart rhythm:        Vascular    Pain in calf, thigh, or hip brought on by ambulation:    Pain in feet at night that wakes you up from your sleep:     Blood clot in your veins:    Leg swelling:         Pulmonary    Oxygen at home:    Productive cough:     Wheezing:         Neurologic    Sudden weakness in arms or legs:     Sudden numbness in arms or legs:     Sudden onset of difficulty speaking or slurred speech:    Temporary loss of vision in one eye:     Problems with dizziness:         Gastrointestinal    Blood in stool:     Vomited blood:         Genitourinary    Burning when urinating:     Blood in urine:        Psychiatric    Major depression:         Hematologic    Bleeding problems:    Problems with blood clotting too easily:        Skin    Rashes or ulcers:        Constitutional    Fever or chills:      PHYSICAL EXAM: Vitals:   06/25/20 0928  BP: 123/80  Pulse: 88  Resp: 16  Temp: 98.6 F (37 C)  TempSrc: Temporal  SpO2: 97%  Weight: 156 lb (70.8 kg)  Height: 5' 2.5" (1.588 m)    GENERAL: The patient is a well-nourished female, in no acute distress. The vital signs are documented above. CARDIAC: There is a regular rate and rhythm.  VASCULAR:  Palpable femoral pulses bilaterally Palpable DP pulses bilaterally PULMONARY: No respiratory  distress. ABDOMEN: Soft and non-tender. MUSCULOSKELETAL: There are  no major deformities or cyanosis. NEUROLOGIC: No focal weakness or paresthesias are detected. SKIN: There are no ulcers or rashes noted. PSYCHIATRIC: The patient has a normal affect.  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  Assessment/Plan:  49 year old female with chronic lower back pain and degenerative spondylolisthesis at L5-S1 who presents for preop evaluation of planned L5-S1 ALIF.  I discussed in detail with her steps of the operation including a transverse incision over the left rectus muscle with mobilization of the left rectus and entering the retroperitoneal space laterally  to mobilize the peritoneum and left ureter across midline including mobilization of the left iliac artery and vein to expose the disc space from the front.  We discussed risk and injury to the above structures.  I reviewed her CT imaging with her and I think she be a great candidate for anterior approach.  I look forward help with Dr. Yetta Barre and she is on the schedule for Friday.   Cephus Shelling, MD [facility] Office: [phone]

## 2020-06-26 NOTE — Anesthesia Preprocedure Evaluation (Addendum)
Anesthesia Evaluation  Patient identified by MRN, date of birth, ID band Patient awake    Reviewed: Allergy & Precautions, NPO status , Patient's Chart, lab work & pertinent test results  Airway Mallampati: III  TM Distance: >3 FB Neck ROM: Full    Dental no notable dental hx. (+) Teeth Intact, Dental Advisory Given   Pulmonary neg pulmonary ROS, Current SmokerPatient did not abstain from smoking.,    Pulmonary exam normal breath sounds clear to auscultation       Cardiovascular Normal cardiovascular exam Rhythm:Regular Rate:Normal  HLD  Long term monitor 10/01/19-10/13/19: Study Highlights Sinus rhythm 2 episodes of ventricular tachycardia with longest at 9 beats.  Rare PVCs 3 short runs of supraventricular tachycardia, longest 12 beats  Echo 09/28/19: IMPRESSIONS  1. Left ventricular ejection fraction, by estimation, is 50 to 55%. The  left ventricle has low normal function. The left ventricle has no regional  wall motion abnormalities. The left ventricular internal cavity size was  mildly dilated. Left ventricular  diastolic parameters are indeterminate. The average left ventricular  global longitudinal strain is -17.6 %.  2. Right ventricular systolic function is normal. The right ventricular  size is normal. There is normal pulmonary artery systolic pressure.  3. The mitral valve is normal in structure. Trivial mitral valve  regurgitation. No evidence of mitral stenosis.  4. The aortic valve is tricuspid. Aortic valve regurgitation is not  visualized. No aortic stenosis is present.  5. The inferior vena cava is normal in size with greater than 50%  respiratory variability, suggesting right atrial pressure of 3 mmHg.   ETT 09/22/19:  Blood pressure demonstrated a normal response to exercise.  There was no ST segment deviation noted during stress.  The patient exercised for 9 min and 10 sec reaching 89% MPHR with  normal exercise capacity of 10.1 mets.  The patient complained of mild leg fatigue and vague cramping in upper right chest area.  Negative ETT for ischemia.  This is a low risk study.    Neuro/Psych Anxiety negative neurological ROS  negative psych ROS   GI/Hepatic Neg liver ROS, GERD  Medicated and Controlled,  Endo/Other  negative endocrine ROS  Renal/GU negative Renal ROS  negative genitourinary   Musculoskeletal  (+) Arthritis , Fibromyalgia -S/p ACDF   Abdominal   Peds  (+) ATTENTION DEFICIT DISORDER WITHOUT HYPERACTIVITY Hematology negative hematology ROS (+)   Anesthesia Other Findings   Reproductive/Obstetrics                           Anesthesia Physical Anesthesia Plan  ASA: II  Anesthesia Plan: General   Post-op Pain Management:    Induction: Intravenous  PONV Risk Score and Plan: 2 and Midazolam, Dexamethasone and Ondansetron  Airway Management Planned: Oral ETT  Additional Equipment:   Intra-op Plan:   Post-operative Plan: Extubation in OR  Informed Consent: I have reviewed the patients History and Physical, chart, labs and discussed the procedure including the risks, benefits and alternatives for the proposed anesthesia with the patient or authorized representative who has indicated his/her understanding and acceptance.     Dental advisory given  Plan Discussed with: CRNA  Anesthesia Plan Comments: (2IVs, ketamine for multimodal analgesia )       Anesthesia Quick Evaluation

## 2020-06-26 NOTE — Progress Notes (Signed)
Anesthesia Chart Review:  Case: [id number] Date/Time: 06/28/20 0715   Procedures:      ALIF - L5-S1 (N/A )     CARPAL TUNNEL RELEASE (Right )     ABDOMINAL EXPOSURE (N/A )   Anesthesia type: General   Pre-op diagnosis: Spondylolisthesis   Location: [facility] ROOM 20 / [facility]   Surgeons: Tia Alert, MD; Cephus Shelling, MD      DISCUSSION: Patient is a 49 year old female scheduled for the above procedures.  History includes smoking, ADHD, GERD, anxiety, fibromyalgia, hypercholesterolemia, hoarseness, RA (seronegative), insomnia, localized edema, mandibular surgery, breast augmentation, cholecystectomy, c-spine surgery (ACDF).   Preoperative cardiology input outlined by Corine Shelter, PA-C on 03/21/20, "...Given past medical history and time since last visit, based on ACC/AHA guidelines, [name] would be at acceptable risk for the planned procedure without further cardiovascular testing..."  06/25/20 presurgical COVID-19 test negative.  Anesthesia team to evaluate on the day of surgery.  Urine pregnancy test on arrival as indicated.   VS: BP 127/78    Pulse 79    Temp 37.1 C (Oral)    Resp 17    SpO2 100%    PROVIDERS: Johny Blamer, MD is PCP  Verne Carrow, MD is cardiologist. Last visit 11/02/19 for follow-up DOE and palpitations (breif SVT, PVCs). Rare palpitations. Cardizem recommended (but currently listed as not taking). March 2021 echo and ETT reassuring. One year follow-up planned.    LABS: Labs reviewed: Acceptable for surgery. (all labs ordered are listed, but only abnormal results are displayed)  Labs Reviewed  BASIC METABOLIC PANEL - Abnormal; Notable for the following components:      Result Value   Glucose, Bld 115 (*)    All other components within normal limits  SURGICAL PCR SCREEN  CBC WITH DIFFERENTIAL/PLATELET  PROTIME-INR  TYPE AND SCREEN     IMAGES: CXR 06/25/20: FINDINGS: The lungs are clear. Heart size is normal. No pneumothorax  or pleural fluid. Cervical fusion hardware and cholecystectomy clips are noted. IMPRESSION: No acute disease.  CT L-spine 05/07/20: IMPRESSION: 1. Severe bilateral L5-S1 facet hypertrophy with grade 1 anterolisthesis, disc uncovering and severe bilateral neural foraminal stenosis. - Aortic Atherosclerosis (ICD10-I70.0).  MRI L-spine 12/15/19 ([facility]): IMPRESSION:  - L5/S1 marked right and moderate to advanced left foraminal stenosis with impingement of the exiting L5 nerve roots. Moderate central canal stenosis. Grade 1 degenerative anterolisthesis with uncoverage and disc herniation.  - L4/L5 mild to moderate central canal stenosis with mild to moderate left and mild right foraminal stenosis.  - L3/L4 mild central canal and mild left foraminal stenosis.    MRI C-spine 11/03/19 ([facility]): IMPRESSION:  1. Mild cervical degenerative disc disease with disc osteophyte most significant at C6-7 also with a right foraminal disc extrusion causing mild canal stenosis with severe right greater then left neural foraminal narrowing.  2. C4-5 mild canal stenosis. Neuroforamina    EKG: 06/25/20: Normal sinus rhythm Minimal voltage criteria for LVH, may be normal variant ( Cornell product ) Borderline ECG Since previous tracing T wave abnormality NOW PRESENT V2 Otherwise no significant change Confirmed by Bryan Lemma ([id number]) on 06/26/2020 1:24:40 AM   CV: Long term monitor 10/01/19-10/13/19: Study Highlights Sinus rhythm 2 episodes of ventricular tachycardia with longest at 9 beats.  Rare PVCs 3 short runs of supraventricular tachycardia, longest 12 beats  Echo 09/28/19: IMPRESSIONS  1. Left ventricular ejection fraction, by estimation, is 50 to 55%. The  left ventricle has low  normal function. The left ventricle has no regional  wall motion abnormalities. The left ventricular internal cavity size was  mildly dilated. Left ventricular  diastolic parameters are indeterminate. The  average left ventricular  global longitudinal strain is -17.6 %.  2. Right ventricular systolic function is normal. The right ventricular  size is normal. There is normal pulmonary artery systolic pressure.  3. The mitral valve is normal in structure. Trivial mitral valve  regurgitation. No evidence of mitral stenosis.  4. The aortic valve is tricuspid. Aortic valve regurgitation is not  visualized. No aortic stenosis is present.  5. The inferior vena cava is normal in size with greater than 50%  respiratory variability, suggesting right atrial pressure of 3 mmHg.   ETT 09/22/19:  Blood pressure demonstrated a normal response to exercise.  There was no ST segment deviation noted during stress.  The patient exercised for 9 min and 10 sec reaching 89% MPHR with normal exercise capacity of 10.1 mets.  The patient complained of mild leg fatigue and vague cramping in upper right chest area.  Negative ETT for ischemia.  This is a low risk study.    Past Medical History:  Diagnosis Date   Anxiety    Arthralgia    Attention-deficit hyperactivity disorder, predominantly inattentive type    Back pain    Fear of flying    Fibromyalgia    Gastroesophageal reflux disease    HPV in female    Joint pain    Localized edema    Neck pain    OA (osteoarthritis)    Papillomavirus as the cause of diseases classified elsewhere    Primary insomnia    Pure hypercholesterolemia    Rheumatoid arthritis, seronegative, hand, unspecified laterality (HCC)    patient feels it is back related, not a true arthritis   Spondylolisthesis of lumbosacral region    Tobacco dependence    Unspecified inflammatory spondylopathy, cervical region (HCC)    Vitamin B12 deficiency anemia    Voice hoarseness     Past Surgical History:  Procedure Laterality Date   BREAST ENHANCEMENT SURGERY     CARPAL TUNNEL RELEASE Left    CERVICAL SPINE SURGERY     CHOLECYSTECTOMY     FOOT  SURGERY     MANDIBLE SURGERY     TUBAL LIGATION     with ablation   TYMPANOSTOMY TUBE PLACEMENT      MEDICATIONS:  albuterol (VENTOLIN HFA) 108 (90 Base) MCG/ACT inhaler   amphetamine-dextroamphetamine (ADDERALL XR) 30 MG 24 hr capsule   aspirin-acetaminophen-caffeine (EXCEDRIN MIGRAINE) 250-250-65 MG tablet   cholecalciferol (VITAMIN D3) 25 MCG (1000 UT) tablet   [employer] VITAMIN B12 1000 MCG tablet   diltiazem (CARDIZEM CD) 120 MG 24 hr capsule   diphenhydrAMINE (BENADRYL) 25 MG tablet   escitalopram (LEXAPRO) 10 MG tablet   gabapentin (NEURONTIN) 100 MG capsule   gabapentin (NEURONTIN) 300 MG capsule   omeprazole (PRILOSEC) 20 MG capsule   podofilox (CONDYLOX) 0.5 % external solution   zolpidem (AMBIEN) 10 MG tablet   No current facility-administered medications for this encounter.    Shonna Chock, PA-C Surgical Short Stay/Anesthesiology [facility] Phone [phone] [facility] Phone [phone] 06/26/2020 12:47 PM

## 2020-06-28 ENCOUNTER — Inpatient Hospital Stay (HOSPITAL_COMMUNITY): Payer: BC Managed Care – PPO | Admitting: Certified Registered"

## 2020-06-28 ENCOUNTER — Other Ambulatory Visit: Payer: Self-pay

## 2020-06-28 ENCOUNTER — Inpatient Hospital Stay (HOSPITAL_COMMUNITY)
Admission: RE | Admit: 2020-06-28 | Discharge: 2020-06-28 | DRG: 460 | Disposition: A | Discharge status: Home / Self Care | Payer: BC Managed Care – PPO | Attending: Neurological Surgery | Admitting: Neurological Surgery

## 2020-06-28 ENCOUNTER — Encounter (HOSPITAL_COMMUNITY): Payer: Self-pay | Admitting: Neurological Surgery

## 2020-06-28 ENCOUNTER — Inpatient Hospital Stay (HOSPITAL_COMMUNITY): Payer: BC Managed Care – PPO | Admitting: Vascular Surgery

## 2020-06-28 ENCOUNTER — Inpatient Hospital Stay (HOSPITAL_COMMUNITY): Payer: BC Managed Care – PPO

## 2020-06-28 ENCOUNTER — Encounter (HOSPITAL_COMMUNITY)
Admission: RE | Disposition: A | Discharge status: Home / Self Care | Payer: Self-pay | Source: Home / Self Care | Attending: Neurological Surgery

## 2020-06-28 DIAGNOSIS — Z20822 Contact with and (suspected) exposure to covid-19: Secondary | ICD-10-CM | POA: Diagnosis present

## 2020-06-28 DIAGNOSIS — M4317 Spondylolisthesis, lumbosacral region: Principal | ICD-10-CM | POA: Diagnosis present

## 2020-06-28 DIAGNOSIS — G8929 Other chronic pain: Secondary | ICD-10-CM | POA: Diagnosis present

## 2020-06-28 DIAGNOSIS — Z981 Arthrodesis status: Secondary | ICD-10-CM

## 2020-06-28 DIAGNOSIS — E7800 Pure hypercholesterolemia, unspecified: Secondary | ICD-10-CM | POA: Diagnosis present

## 2020-06-28 DIAGNOSIS — K219 Gastro-esophageal reflux disease without esophagitis: Secondary | ICD-10-CM | POA: Diagnosis present

## 2020-06-28 DIAGNOSIS — Z8249 Family history of ischemic heart disease and other diseases of the circulatory system: Secondary | ICD-10-CM | POA: Diagnosis not present

## 2020-06-28 DIAGNOSIS — Z79899 Other long term (current) drug therapy: Secondary | ICD-10-CM

## 2020-06-28 DIAGNOSIS — G5601 Carpal tunnel syndrome, right upper limb: Secondary | ICD-10-CM | POA: Diagnosis present

## 2020-06-28 DIAGNOSIS — F17210 Nicotine dependence, cigarettes, uncomplicated: Secondary | ICD-10-CM | POA: Diagnosis present

## 2020-06-28 DIAGNOSIS — D519 Vitamin B12 deficiency anemia, unspecified: Secondary | ICD-10-CM | POA: Diagnosis present

## 2020-06-28 DIAGNOSIS — M797 Fibromyalgia: Secondary | ICD-10-CM | POA: Diagnosis present

## 2020-06-28 DIAGNOSIS — F900 Attention-deficit hyperactivity disorder, predominantly inattentive type: Secondary | ICD-10-CM | POA: Diagnosis present

## 2020-06-28 DIAGNOSIS — M5117 Intervertebral disc disorders with radiculopathy, lumbosacral region: Secondary | ICD-10-CM | POA: Diagnosis present

## 2020-06-28 DIAGNOSIS — M545 Low back pain, unspecified: Secondary | ICD-10-CM | POA: Diagnosis present

## 2020-06-28 DIAGNOSIS — F419 Anxiety disorder, unspecified: Secondary | ICD-10-CM | POA: Diagnosis present

## 2020-06-28 DIAGNOSIS — Z419 Encounter for procedure for purposes other than remedying health state, unspecified: Secondary | ICD-10-CM

## 2020-06-28 DIAGNOSIS — E785 Hyperlipidemia, unspecified: Secondary | ICD-10-CM | POA: Diagnosis present

## 2020-06-28 HISTORY — PX: ABDOMINAL EXPOSURE: SHX5708

## 2020-06-28 HISTORY — PX: ANTERIOR LUMBAR FUSION: SHX1170

## 2020-06-28 HISTORY — PX: CARPAL TUNNEL RELEASE: SHX101

## 2020-06-28 LAB — ABO/RH: ABO/RH(D): O POS

## 2020-06-28 LAB — POCT PREGNANCY, URINE: Preg Test, Ur: NEGATIVE

## 2020-06-28 SURGERY — ANTERIOR LUMBAR FUSION 1 LEVEL
Anesthesia: General | Site: Wrist | Laterality: Right

## 2020-06-28 MED ORDER — THROMBIN 20000 UNITS EX SOLR
CUTANEOUS | Status: AC
  Filled 2020-06-28: qty 20000

## 2020-06-28 MED ORDER — POTASSIUM CHLORIDE IN NACL 20-0.9 MEQ/L-% IV SOLN: INTRAVENOUS | Status: DC

## 2020-06-28 MED ORDER — METHOCARBAMOL 500 MG PO TABS
500.0000 mg | ORAL_TABLET | Freq: Four times a day (QID) | ORAL | Status: DC | PRN
  Administered 2020-06-28: 11:00:00 500 mg via ORAL

## 2020-06-28 MED ORDER — ROCURONIUM BROMIDE 10 MG/ML (PF) SYRINGE
PREFILLED_SYRINGE | INTRAVENOUS | Status: DC | PRN
  Administered 2020-06-28: 60 mg via INTRAVENOUS

## 2020-06-28 MED ORDER — ONDANSETRON HCL 4 MG/2ML IJ SOLN: 4.0000 mg | Freq: Four times a day (QID) | INTRAMUSCULAR | Status: DC | PRN

## 2020-06-28 MED ORDER — GABAPENTIN 300 MG PO CAPS
300.0000 mg | ORAL_CAPSULE | ORAL | Status: AC
  Administered 2020-06-28: 06:00:00 300 mg via ORAL
  Filled 2020-06-28: qty 1

## 2020-06-28 MED ORDER — ONDANSETRON HCL 4 MG/2ML IJ SOLN
INTRAMUSCULAR | Status: AC
  Filled 2020-06-28: qty 2

## 2020-06-28 MED ORDER — CHLORHEXIDINE GLUCONATE 4 % EX LIQD: 60.0000 mL | Freq: Once | CUTANEOUS | Status: DC

## 2020-06-28 MED ORDER — PROPOFOL 10 MG/ML IV BOLUS
INTRAVENOUS | Status: DC | PRN
  Administered 2020-06-28: 170 mg via INTRAVENOUS

## 2020-06-28 MED ORDER — SENNA 8.6 MG PO TABS
1.0000 | ORAL_TABLET | Freq: Two times a day (BID) | ORAL | Status: DC
  Administered 2020-06-28: 13:00:00 8.6 mg via ORAL
  Filled 2020-06-28: qty 1

## 2020-06-28 MED ORDER — THROMBIN 5000 UNITS EX SOLR
OROMUCOSAL | Status: DC | PRN
  Administered 2020-06-28: 09:00:00 5 mL via TOPICAL

## 2020-06-28 MED ORDER — LACTATED RINGERS IV SOLN: INTRAVENOUS | Status: DC | PRN

## 2020-06-28 MED ORDER — ONDANSETRON HCL 4 MG/2ML IJ SOLN
INTRAMUSCULAR | Status: DC | PRN
  Administered 2020-06-28: 4 mg via INTRAVENOUS

## 2020-06-28 MED ORDER — PHENOL 1.4 % MT LIQD: 1.0000 | OROMUCOSAL | Status: DC | PRN

## 2020-06-28 MED ORDER — MIDAZOLAM HCL 2 MG/2ML IJ SOLN
INTRAMUSCULAR | Status: AC
  Filled 2020-06-28: qty 2

## 2020-06-28 MED ORDER — KETAMINE HCL 10 MG/ML IJ SOLN
INTRAMUSCULAR | Status: DC | PRN
  Administered 2020-06-28: 20 mg via INTRAVENOUS
  Administered 2020-06-28: 10 mg via INTRAVENOUS

## 2020-06-28 MED ORDER — THROMBIN 20000 UNITS EX SOLR
CUTANEOUS | Status: DC | PRN
  Administered 2020-06-28: 09:00:00 20 mL via TOPICAL

## 2020-06-28 MED ORDER — OXYCODONE-ACETAMINOPHEN 10-325 MG PO TABS: 1.0000 | ORAL_TABLET | Freq: Four times a day (QID) | ORAL | 0 refills | Status: AC | PRN

## 2020-06-28 MED ORDER — BUPIVACAINE HCL (PF) 0.25 % IJ SOLN
INTRAMUSCULAR | Status: AC
  Filled 2020-06-28: qty 30

## 2020-06-28 MED ORDER — METHOCARBAMOL 1000 MG/10ML IJ SOLN
500.0000 mg | Freq: Four times a day (QID) | INTRAVENOUS | Status: DC | PRN
  Filled 2020-06-28: qty 5

## 2020-06-28 MED ORDER — KETAMINE HCL 50 MG/5ML IJ SOSY
PREFILLED_SYRINGE | INTRAMUSCULAR | Status: AC
  Filled 2020-06-28: qty 5

## 2020-06-28 MED ORDER — AMPHETAMINE-DEXTROAMPHET ER 30 MG PO CP24: 60.0000 mg | ORAL_CAPSULE | Freq: Every day | ORAL | Status: DC

## 2020-06-28 MED ORDER — BUPIVACAINE HCL (PF) 0.25 % IJ SOLN
INTRAMUSCULAR | Status: DC | PRN
  Administered 2020-06-28 (×2): 7 mL

## 2020-06-28 MED ORDER — LIDOCAINE 2% (20 MG/ML) 5 ML SYRINGE
INTRAMUSCULAR | Status: AC
  Filled 2020-06-28: qty 5

## 2020-06-28 MED ORDER — CHLORHEXIDINE GLUCONATE 0.12 % MT SOLN
OROMUCOSAL | Status: AC
  Administered 2020-06-28: 06:00:00 15 mL via OROMUCOSAL
  Filled 2020-06-28: qty 15

## 2020-06-28 MED ORDER — CHLORHEXIDINE GLUCONATE 0.12 % MT SOLN
15.0000 mL | Freq: Once | OROMUCOSAL | Status: AC
  Filled 2020-06-28: qty 15

## 2020-06-28 MED ORDER — SODIUM CHLORIDE 0.9% FLUSH: 3.0000 mL | INTRAVENOUS | Status: DC | PRN

## 2020-06-28 MED ORDER — ALBUTEROL SULFATE HFA 108 (90 BASE) MCG/ACT IN AERS
2.0000 | INHALATION_SPRAY | RESPIRATORY_TRACT | Status: DC | PRN
  Filled 2020-06-28: qty 6.7

## 2020-06-28 MED ORDER — MORPHINE SULFATE (PF) 2 MG/ML IV SOLN: 2.0000 mg | INTRAVENOUS | Status: DC | PRN

## 2020-06-28 MED ORDER — ROCURONIUM BROMIDE 10 MG/ML (PF) SYRINGE
PREFILLED_SYRINGE | INTRAVENOUS | Status: AC
  Filled 2020-06-28: qty 10

## 2020-06-28 MED ORDER — PROPOFOL 10 MG/ML IV BOLUS
INTRAVENOUS | Status: AC
  Filled 2020-06-28: qty 40

## 2020-06-28 MED ORDER — MENTHOL 3 MG MT LOZG: 1.0000 | LOZENGE | OROMUCOSAL | Status: DC | PRN

## 2020-06-28 MED ORDER — ACETAMINOPHEN 650 MG RE SUPP: 650.0000 mg | RECTAL | Status: DC | PRN

## 2020-06-28 MED ORDER — FENTANYL CITRATE (PF) 250 MCG/5ML IJ SOLN
INTRAMUSCULAR | Status: AC
  Filled 2020-06-28: qty 5

## 2020-06-28 MED ORDER — GABAPENTIN 300 MG PO CAPS
300.0000 mg | ORAL_CAPSULE | Freq: Three times a day (TID) | ORAL | Status: DC
  Administered 2020-06-28: 16:00:00 300 mg via ORAL
  Filled 2020-06-28: qty 1

## 2020-06-28 MED ORDER — 0.9 % SODIUM CHLORIDE (POUR BTL) OPTIME
TOPICAL | Status: DC | PRN
  Administered 2020-06-28: 09:00:00 1000 mL

## 2020-06-28 MED ORDER — PHENYLEPHRINE 40 MCG/ML (10ML) SYRINGE FOR IV PUSH (FOR BLOOD PRESSURE SUPPORT): PREFILLED_SYRINGE | INTRAVENOUS | Status: DC | PRN

## 2020-06-28 MED ORDER — SODIUM CHLORIDE 0.9 % IV SOLN: 250.0000 mL | INTRAVENOUS | Status: DC

## 2020-06-28 MED ORDER — ACETAMINOPHEN 325 MG PO TABS: 650.0000 mg | ORAL_TABLET | ORAL | Status: DC | PRN

## 2020-06-28 MED ORDER — CEFAZOLIN SODIUM-DEXTROSE 2-4 GM/100ML-% IV SOLN
2.0000 g | Freq: Three times a day (TID) | INTRAVENOUS | Status: DC
  Administered 2020-06-28: 16:00:00 2 g via INTRAVENOUS
  Filled 2020-06-28: qty 100

## 2020-06-28 MED ORDER — DEXAMETHASONE SODIUM PHOSPHATE 10 MG/ML IJ SOLN
10.0000 mg | Freq: Once | INTRAMUSCULAR | Status: AC
  Administered 2020-06-28: 08:00:00 10 mg via INTRAVENOUS
  Filled 2020-06-28: qty 1

## 2020-06-28 MED ORDER — CHLORHEXIDINE GLUCONATE CLOTH 2 % EX PADS: 6.0000 | MEDICATED_PAD | Freq: Once | CUTANEOUS | Status: DC

## 2020-06-28 MED ORDER — MIDAZOLAM HCL 5 MG/5ML IJ SOLN
INTRAMUSCULAR | Status: DC | PRN
  Administered 2020-06-28: 2 mg via INTRAVENOUS

## 2020-06-28 MED ORDER — OXYCODONE HCL 5 MG PO TABS
10.0000 mg | ORAL_TABLET | ORAL | Status: DC | PRN
  Administered 2020-06-28 (×2): 10 mg via ORAL
  Filled 2020-06-28 (×2): qty 2

## 2020-06-28 MED ORDER — ONDANSETRON HCL 4 MG PO TABS: 4.0000 mg | ORAL_TABLET | Freq: Four times a day (QID) | ORAL | Status: DC | PRN

## 2020-06-28 MED ORDER — CELECOXIB 200 MG PO CAPS
200.0000 mg | ORAL_CAPSULE | Freq: Two times a day (BID) | ORAL | Status: DC
  Administered 2020-06-28: 13:00:00 200 mg via ORAL
  Filled 2020-06-28: qty 1

## 2020-06-28 MED ORDER — ACETAMINOPHEN 500 MG PO TABS
1000.0000 mg | ORAL_TABLET | ORAL | Status: AC
  Administered 2020-06-28: 06:00:00 1000 mg via ORAL
  Filled 2020-06-28: qty 2

## 2020-06-28 MED ORDER — FENTANYL CITRATE (PF) 100 MCG/2ML IJ SOLN
INTRAMUSCULAR | Status: AC
  Filled 2020-06-28: qty 2

## 2020-06-28 MED ORDER — FENTANYL CITRATE (PF) 100 MCG/2ML IJ SOLN
25.0000 ug | INTRAMUSCULAR | Status: DC | PRN
  Administered 2020-06-28 (×2): 50 ug via INTRAVENOUS

## 2020-06-28 MED ORDER — ARTIFICIAL TEARS OPHTHALMIC OINT
TOPICAL_OINTMENT | OPHTHALMIC | Status: AC
  Filled 2020-06-28: qty 3.5

## 2020-06-28 MED ORDER — SODIUM CHLORIDE 0.9% FLUSH: 3.0000 mL | Freq: Two times a day (BID) | INTRAVENOUS | Status: DC

## 2020-06-28 MED ORDER — FENTANYL CITRATE (PF) 100 MCG/2ML IJ SOLN
INTRAMUSCULAR | Status: DC | PRN
  Administered 2020-06-28: 25 ug via INTRAVENOUS
  Administered 2020-06-28: 100 ug via INTRAVENOUS
  Administered 2020-06-28: 25 ug via INTRAVENOUS

## 2020-06-28 MED ORDER — ACETAMINOPHEN 10 MG/ML IV SOLN
INTRAVENOUS | Status: AC
  Filled 2020-06-28: qty 100

## 2020-06-28 MED ORDER — METHOCARBAMOL 500 MG PO TABS
ORAL_TABLET | ORAL | Status: AC
  Filled 2020-06-28: qty 1

## 2020-06-28 MED ORDER — LIDOCAINE 2% (20 MG/ML) 5 ML SYRINGE
INTRAMUSCULAR | Status: DC | PRN
  Administered 2020-06-28: 40 mg via INTRAVENOUS

## 2020-06-28 MED ORDER — SUGAMMADEX SODIUM 200 MG/2ML IV SOLN
INTRAVENOUS | Status: DC | PRN
  Administered 2020-06-28: 150 mg via INTRAVENOUS

## 2020-06-28 MED ORDER — DEXAMETHASONE SODIUM PHOSPHATE 10 MG/ML IJ SOLN
INTRAMUSCULAR | Status: AC
  Filled 2020-06-28: qty 1

## 2020-06-28 MED ORDER — THROMBIN 5000 UNITS EX SOLR
CUTANEOUS | Status: AC
  Filled 2020-06-28: qty 5000

## 2020-06-28 MED ORDER — PHENYLEPHRINE HCL-NACL 10-0.9 MG/250ML-% IV SOLN
INTRAVENOUS | Status: DC | PRN
  Administered 2020-06-28: 30 ug/min via INTRAVENOUS

## 2020-06-28 MED ORDER — CEFAZOLIN SODIUM-DEXTROSE 2-4 GM/100ML-% IV SOLN
2.0000 g | INTRAVENOUS | Status: AC
  Administered 2020-06-28: 08:00:00 2 g via INTRAVENOUS
  Filled 2020-06-28: qty 100

## 2020-06-28 MED ORDER — METHOCARBAMOL 500 MG PO TABS: 500.0000 mg | ORAL_TABLET | Freq: Four times a day (QID) | ORAL | 0 refills | Status: DC

## 2020-06-28 SURGICAL SUPPLY — 103 items
ANCHOR LUMBAR 25 MIS (Anchor) ×15 IMPLANT
APPLIER CLIP 11 MED OPEN (CLIP) ×5
BENZOIN TINCTURE PRP APPL 2/3 (GAUZE/BANDAGES/DRESSINGS) ×5 IMPLANT
BLADE SURG 15 STRL LF DISP TIS (BLADE) ×3 IMPLANT
BLADE SURG 15 STRL SS (BLADE) ×5
BNDG ELASTIC 4X5.8 VLCR STR LF (GAUZE/BANDAGES/DRESSINGS) ×5 IMPLANT
BNDG GAUZE ELAST 4 BULKY (GAUZE/BANDAGES/DRESSINGS) ×5 IMPLANT
BUR BARREL STRAIGHT FLUTE 4.0 (BURR) IMPLANT
BUR CARBIDE MATCH 3.0 (BURR) ×5 IMPLANT
CABLE BIPOLOR RESECTION CORD (MISCELLANEOUS) ×5 IMPLANT
CANISTER SUCT 3000ML PPV (MISCELLANEOUS) ×10 IMPLANT
CLIP APPLIE 11 MED OPEN (CLIP) ×3 IMPLANT
CLIP LIGATING EXTRA MED SLVR (CLIP) IMPLANT
CLIP LIGATING EXTRA SM BLUE (MISCELLANEOUS) IMPLANT
CLOSURE WOUND 1/2 X4 (GAUZE/BANDAGES/DRESSINGS) ×1
COVER WAND RF STERILE (DRAPES) IMPLANT
DERMABOND ADVANCED (GAUZE/BANDAGES/DRESSINGS) ×2
DERMABOND ADVANCED .7 DNX12 (GAUZE/BANDAGES/DRESSINGS) ×3 IMPLANT
DIFFUSER DRILL AIR PNEUMATIC (MISCELLANEOUS) IMPLANT
DRAPE C-ARM 42X72 X-RAY (DRAPES) ×5 IMPLANT
DRAPE EXTREMITY T 121X128X90 (DISPOSABLE) ×5 IMPLANT
DRAPE HALF SHEET 40X57 (DRAPES) ×5 IMPLANT
DRAPE LAPAROTOMY 100X72X124 (DRAPES) ×5 IMPLANT
DRSG OPSITE POSTOP 4X8 (GAUZE/BANDAGES/DRESSINGS) ×5 IMPLANT
DURAPREP 26ML APPLICATOR (WOUND CARE) ×10 IMPLANT
ELECT BLADE 4.0 EZ CLEAN MEGAD (MISCELLANEOUS) ×5
ELECT REM PT RETURN 9FT ADLT (ELECTROSURGICAL) ×5
ELECTRODE BLDE 4.0 EZ CLN MEGD (MISCELLANEOUS) ×3 IMPLANT
ELECTRODE REM PT RTRN 9FT ADLT (ELECTROSURGICAL) ×3 IMPLANT
GAUZE 4X4 16PLY RFD (DISPOSABLE) ×5 IMPLANT
GAUZE SPONGE 4X4 12PLY STRL (GAUZE/BANDAGES/DRESSINGS) IMPLANT
GAUZE SPONGE 4X4 12PLY STRL LF (GAUZE/BANDAGES/DRESSINGS) ×5 IMPLANT
GAUZE XEROFORM 1X8 LF (GAUZE/BANDAGES/DRESSINGS) ×5 IMPLANT
GLOVE BIO SURGEON STRL SZ 6.5 (GLOVE) ×4 IMPLANT
GLOVE BIO SURGEON STRL SZ7 (GLOVE) ×10 IMPLANT
GLOVE BIO SURGEON STRL SZ7.5 (GLOVE) ×5 IMPLANT
GLOVE BIO SURGEON STRL SZ8 (GLOVE) ×15 IMPLANT
GLOVE BIO SURGEONS STRL SZ 6.5 (GLOVE) ×1
GLOVE BIOGEL PI IND STRL 7.0 (GLOVE) ×6 IMPLANT
GLOVE BIOGEL PI IND STRL 8 (GLOVE) ×3 IMPLANT
GLOVE BIOGEL PI INDICATOR 7.0 (GLOVE) ×4
GLOVE BIOGEL PI INDICATOR 8 (GLOVE) ×2
GLOVE SS BIOGEL STRL SZ 7.5 (GLOVE) ×3 IMPLANT
GLOVE SUPERSENSE BIOGEL SZ 7.5 (GLOVE) ×2
GLOVE SURG SS PI 6.0 STRL IVOR (GLOVE) ×20 IMPLANT
GLOVE SURG UNDER POLY LF SZ6.5 (GLOVE) ×10 IMPLANT
GOWN STRL REUS W/ TWL LRG LVL3 (GOWN DISPOSABLE) ×12 IMPLANT
GOWN STRL REUS W/ TWL XL LVL3 (GOWN DISPOSABLE) ×9 IMPLANT
GOWN STRL REUS W/TWL 2XL LVL3 (GOWN DISPOSABLE) IMPLANT
GOWN STRL REUS W/TWL LRG LVL3 (GOWN DISPOSABLE) ×20
GOWN STRL REUS W/TWL XL LVL3 (GOWN DISPOSABLE) ×15
GRAFT TRINITY ELITE LGE HUMAN (Tissue) ×5 IMPLANT
HEMOSTAT POWDER KIT SURGIFOAM (HEMOSTASIS) ×5 IMPLANT
HEMOSTAT SNOW SURGICEL 2X4 (HEMOSTASIS) IMPLANT
INSERT FOGARTY 61MM (MISCELLANEOUS) IMPLANT
INSERT FOGARTY SM (MISCELLANEOUS) IMPLANT
KIT BASIN OR (CUSTOM PROCEDURE TRAY) ×5 IMPLANT
KIT TURNOVER KIT B (KITS) ×5 IMPLANT
LOOP VESSEL MAXI BLUE (MISCELLANEOUS) IMPLANT
LOOP VESSEL MINI RED (MISCELLANEOUS) IMPLANT
NEEDLE HYPO 25X1 1.5 SAFETY (NEEDLE) ×5 IMPLANT
NEEDLE SPNL 18GX3.5 QUINCKE PK (NEEDLE) ×5 IMPLANT
NS IRRIG 1000ML POUR BTL (IV SOLUTION) ×5 IMPLANT
PACK LAMINECTOMY NEURO (CUSTOM PROCEDURE TRAY) ×5 IMPLANT
PACK SURGICAL SETUP 50X90 (CUSTOM PROCEDURE TRAY) ×5 IMPLANT
PAD ARMBOARD 7.5X6 YLW CONV (MISCELLANEOUS) ×15 IMPLANT
SPACER HEDRON IA 26X34X11 8D (Spacer) ×5 IMPLANT
SPONGE INTESTINAL PEANUT (DISPOSABLE) ×15 IMPLANT
SPONGE LAP 18X18 RF (DISPOSABLE) ×5 IMPLANT
SPONGE LAP 4X18 RFD (DISPOSABLE) IMPLANT
SPONGE SURGIFOAM ABS GEL 100 (HEMOSTASIS) ×5 IMPLANT
STAPLER VISISTAT 35W (STAPLE) IMPLANT
STOCKINETTE 4X48 STRL (DRAPES) ×5 IMPLANT
STRIP CLOSURE SKIN 1/2X4 (GAUZE/BANDAGES/DRESSINGS) ×4 IMPLANT
SUT ETHILON 4 0 PS 2 18 (SUTURE) IMPLANT
SUT PDS AB 1 CTX 36 (SUTURE) ×5 IMPLANT
SUT PROLENE 4 0 RB 1 (SUTURE)
SUT PROLENE 4-0 RB1 .5 CRCL 36 (SUTURE) IMPLANT
SUT PROLENE 5 0 CC1 (SUTURE) IMPLANT
SUT PROLENE 6 0 C 1 30 (SUTURE) IMPLANT
SUT PROLENE 6 0 CC (SUTURE) IMPLANT
SUT SILK 0 TIES 10X30 (SUTURE) IMPLANT
SUT SILK 2 0 TIES 10X30 (SUTURE) ×5 IMPLANT
SUT SILK 2 0SH CR/8 30 (SUTURE) IMPLANT
SUT SILK 3 0 TIES 17X18 (SUTURE)
SUT SILK 3 0SH CR/8 30 (SUTURE) IMPLANT
SUT SILK 3-0 18XBRD TIE BLK (SUTURE) IMPLANT
SUT VIC AB 0 CT1 18XCR BRD8 (SUTURE) ×3 IMPLANT
SUT VIC AB 0 CT1 27 (SUTURE)
SUT VIC AB 0 CT1 27XBRD ANBCTR (SUTURE) IMPLANT
SUT VIC AB 0 CT1 8-18 (SUTURE) ×5
SUT VIC AB 2-0 CP2 18 (SUTURE) ×5 IMPLANT
SUT VIC AB 3-0 SH 8-18 (SUTURE) ×10 IMPLANT
SUT VICRYL 4-0 PS2 18IN ABS (SUTURE) ×5 IMPLANT
SYR BULB EAR ULCER 3OZ GRN STR (SYRINGE) ×5 IMPLANT
SYR CONTROL 10ML LL (SYRINGE) ×5 IMPLANT
TOWEL GREEN STERILE (TOWEL DISPOSABLE) ×5 IMPLANT
TOWEL GREEN STERILE FF (TOWEL DISPOSABLE) ×10 IMPLANT
TRAY FOLEY MTR SLVR 16FR STAT (SET/KITS/TRAYS/PACK) ×5 IMPLANT
TUBE CONNECTING 12'X1/4 (SUCTIONS) ×1
TUBE CONNECTING 12X1/4 (SUCTIONS) ×4 IMPLANT
UNDERPAD 30X36 HEAVY ABSORB (UNDERPADS AND DIAPERS) IMPLANT
WATER STERILE IRR 1000ML POUR (IV SOLUTION) ×5 IMPLANT

## 2020-06-28 NOTE — H&P (Signed)
History and Physical Interval Note:  06/28/2020 7:22 AM  [name]  has presented today for surgery, with the diagnosis of Spondylolisthesis.  The various methods of treatment have been discussed with the patient and family. After consideration of risks, benefits and other options for treatment, the patient has consented to  Procedure(s): ALIF - L5-S1 (N/A) CARPAL TUNNEL RELEASE (Right) ABDOMINAL EXPOSURE (N/A) as a surgical intervention.  The patient's history has been reviewed, patient examined, no change in status, stable for surgery.  I have reviewed the patient's chart and labs.  Questions were answered to the patient's satisfaction.    L5-S1 ALIF abdominal exposure  Cephus Shelling  Patient name: [name]      MRN: [id number]        DOB: 1971/04/06          Sex: female  REASON FOR CONSULT: Evaluate for L5-S1 ALIF  HPI: [name] is a 49 y.o. female, with history of anxiety that presents for preop evaluation of L5-S1 ALIF.  Patient reports at least 10 to 20 years of lower back pain with bilateral lower extremity radiculopathy.  She has been evaluated by Dr. Yetta Barre with neurosurgery and has failed conservative management and he has now recommended an L5-S1 from an anterior approach.  She states she used to be able to drive in the car for up to 4.5 hours before her legs went numb and now she only can drive for a couple hours.  Previous abdominal surgery includes laparoscopic cholecystectomy and tubal ligation.  Previously has a 3 level ACDF with Dr. Yetta Barre with hoarseness that is now resolved.      Past Medical History:  Diagnosis Date  . Anxiety   . Arthralgia   . Attention-deficit hyperactivity disorder, predominantly inattentive type   . Back pain   . Fear of flying   . Fibromyalgia   . Gastroesophageal reflux disease   . HPV in female   . Joint pain   . Localized edema   . Neck pain   . OA (osteoarthritis)   . Papillomavirus as the  cause of diseases classified elsewhere   . Primary insomnia   . Pure hypercholesterolemia   . Rheumatoid arthritis, seronegative, hand, unspecified laterality (HCC)   . Spondylolisthesis of lumbosacral region   . Tobacco dependence   . Unspecified inflammatory spondylopathy, cervical region (HCC)   . Vitamin B12 deficiency anemia   . Voice hoarseness          Past Surgical History:  Procedure Laterality Date  . CESAREAN SECTION WITH BILATERAL TUBAL LIGATION    . CHOLECYSTECTOMY    . FOOT SURGERY    . MANDIBLE SURGERY           Family History  Problem Relation Age of Onset  . Hypertension Father   . Heart disease Father     SOCIAL HISTORY: Social History        Socioeconomic History  . Marital status: Married    Spouse name: Not on file  . Number of children: 2  . Years of education: Not on file  . Highest education level: Not on file  Occupational History  . Occupation: Works at home  Tobacco Use  . Smoking status: Current Every Day Smoker    Packs/day: 1.00    Types: Cigarettes  . Smokeless tobacco: Never Used  Substance and Sexual Activity  . Alcohol use: Yes    Comment: occasional  . Drug use: Not on file  .  Sexual activity: Not on file  Other Topics Concern  . Not on file  Social History Narrative  . Not on file   Social Determinants of Health   Financial Resource Strain: Not on file  Food Insecurity: Not on file  Transportation Needs: Not on file  Physical Activity: Not on file  Stress: Not on file  Social Connections: Not on file  Intimate Partner Violence: Not on file         Allergies  Allergen Reactions  . Zoloft [Sertraline]     Altered mental state          Current Outpatient Medications  Medication Sig Dispense Refill  . albuterol (VENTOLIN HFA) 108 (90 Base) MCG/ACT inhaler Inhale 2 puffs into the lungs every 4 (four) hours as needed for wheezing or shortness of breath.    .  amphetamine-dextroamphetamine (ADDERALL XR) 30 MG 24 hr capsule Take 60 mg by mouth daily.    Marland Kitchen aspirin-acetaminophen-caffeine (EXCEDRIN MIGRAINE) 250-250-65 MG tablet Take 2 tablets by mouth every 6 (six) hours as needed for headache.    . cholecalciferol (VITAMIN D3) 25 MCG (1000 UT) tablet Take 1,000 Units by mouth daily.    . [employer]S VITAMIN B12 1000 MCG tablet Take 1,000 mcg by mouth daily.    . diphenhydrAMINE (BENADRYL) 25 MG tablet Take 25 mg by mouth every 6 (six) hours as needed for allergies.    Marland Kitchen escitalopram (LEXAPRO) 10 MG tablet Take 10 mg by mouth daily as needed (anxiety).    . gabapentin (NEURONTIN) 100 MG capsule Take 100-600 mg by mouth 4 (four) times daily as needed (pain).    Marland Kitchen gabapentin (NEURONTIN) 300 MG capsule Take 600 mg by mouth at bedtime.    Marland Kitchen omeprazole (PRILOSEC) 20 MG capsule Take 20 mg by mouth daily.    . podofilox (CONDYLOX) 0.5 % external solution Apply 1 application topically once a week.    . zolpidem (AMBIEN) 10 MG tablet Take 10 mg by mouth at bedtime.    Marland Kitchen diltiazem (CARDIZEM CD) 120 MG 24 hr capsule Take 1 capsule (120 mg total) by mouth daily. (Patient not taking: Reported on 06/25/2020) 90 capsule 3   No current facility-administered medications for this visit.    REVIEW OF SYSTEMS:  [X]  denotes positive finding, [ ]  denotes negative finding Cardiac  Comments:  Chest pain or chest pressure:    Shortness of breath upon exertion:    Short of breath when lying flat:    Irregular heart rhythm:        Vascular    Pain in calf, thigh, or hip brought on by ambulation:    Pain in feet at night that wakes you up from your sleep:     Blood clot in your veins:    Leg swelling:         Pulmonary    Oxygen at home:    Productive cough:     Wheezing:         Neurologic    Sudden weakness in arms or legs:     Sudden numbness in arms or legs:     Sudden onset of difficulty speaking or  slurred speech:    Temporary loss of vision in one eye:     Problems with dizziness:         Gastrointestinal    Blood in stool:     Vomited blood:         Genitourinary    Burning when urinating:  Blood in urine:        Psychiatric    Major depression:         Hematologic    Bleeding problems:    Problems with blood clotting too easily:        Skin    Rashes or ulcers:        Constitutional    Fever or chills:      PHYSICAL EXAM:    Vitals:   06/25/20 0928  BP: 123/80  Pulse: 88  Resp: 16  Temp: 98.6 F (37 C)  TempSrc: Temporal  SpO2: 97%  Weight: 156 lb (70.8 kg)  Height: 5' 2.5" (1.588 m)    GENERAL: The patient is a well-nourished female, in no acute distress. The vital signs are documented above. CARDIAC: There is a regular rate and rhythm.  VASCULAR:  Palpable femoral pulses bilaterally Palpable DP pulses bilaterally PULMONARY: No respiratory distress. ABDOMEN: Soft and non-tender. MUSCULOSKELETAL: There are no major deformities or cyanosis. NEUROLOGIC: No focal weakness or paresthesias are detected. SKIN: There are no ulcers or rashes noted. PSYCHIATRIC: The patient has a normal affect.  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  Assessment/Plan:  49 year old female with chronic lower back pain and degenerative spondylolisthesis at L5-S1 who presents for preop evaluation of planned L5-S1 ALIF.  I discussed in detail with her steps of the operation including a transverse incision over the left rectus muscle with mobilization of the left rectus and entering the retroperitoneal space laterally  to mobilize the peritoneum and left ureter across midline including mobilization of the left iliac artery and vein to expose the disc space  from the front.  We discussed risk and injury to the above structures.  I reviewed her CT imaging with her and I think she be a great candidate for anterior approach.  I look forward help with Dr. Yetta Barre and she is on the schedule for Friday.   Cephus Shelling, MD [facility] Office: [phone]

## 2020-06-28 NOTE — H&P (Signed)
Subjective: Patient is a 49 y.o. female admitted for back pain. Onset of symptoms was several months ago, unchanged since that time.  The pain is rated severe, and is located at the across the lower back and radiates to hips. The pain is described as aching and occurs all day. The symptoms have been progressive. Symptoms are exacerbated by exercise, sitting and standing. MRI or CT showed DDD with spondylolisthesis L5-S1. EMGs showed R CTS   Past Medical History:  Diagnosis Date  . Anxiety   . Arthralgia   . Attention-deficit hyperactivity disorder, predominantly inattentive type   . Back pain   . Fear of flying   . Fibromyalgia   . Gastroesophageal reflux disease   . HPV in female   . Joint pain   . Localized edema   . Neck pain   . OA (osteoarthritis)   . Papillomavirus as the cause of diseases classified elsewhere   . Primary insomnia   . Pure hypercholesterolemia   . Rheumatoid arthritis, seronegative, hand, unspecified laterality (HCC)    patient feels it is back related, not a true arthritis  . Spondylolisthesis of lumbosacral region   . Tobacco dependence   . Unspecified inflammatory spondylopathy, cervical region (HCC)   . Vitamin B12 deficiency anemia   . Voice hoarseness     Past Surgical History:  Procedure Laterality Date  . BREAST ENHANCEMENT SURGERY    . CARPAL TUNNEL RELEASE Left   . CERVICAL SPINE SURGERY    . CHOLECYSTECTOMY    . FOOT SURGERY    . MANDIBLE SURGERY    . TUBAL LIGATION     with ablation  . TYMPANOSTOMY TUBE PLACEMENT      Prior to Admission medications   Medication Sig Start Date End Date Taking? Authorizing Provider  albuterol (VENTOLIN HFA) 108 (90 Base) MCG/ACT inhaler Inhale 2 puffs into the lungs every 4 (four) hours as needed for wheezing or shortness of breath.   Yes [provider]  amphetamine-dextroamphetamine (ADDERALL XR) 30 MG 24 hr capsule Take 60 mg by mouth daily.   Yes [provider]   aspirin-acetaminophen-caffeine (EXCEDRIN MIGRAINE) [id number] MG tablet Take 2 tablets by mouth every 6 (six) hours as needed for headache.   Yes [provider]  cholecalciferol (VITAMIN D3) 25 MCG (1000 UT) tablet Take 1,000 Units by mouth daily.   Yes [provider]  CVS VITAMIN B12 1000 MCG tablet Take 1,000 mcg by mouth daily. 04/06/20  Yes [provider]  diphenhydrAMINE (BENADRYL) 25 MG tablet Take 25 mg by mouth every 6 (six) hours as needed for allergies.   Yes [provider]  escitalopram (LEXAPRO) 10 MG tablet Take 10 mg by mouth daily as needed (anxiety).   Yes [provider]  gabapentin (NEURONTIN) 100 MG capsule Take 100-600 mg by mouth 4 (four) times daily as needed (pain).   Yes [provider]  gabapentin (NEURONTIN) 300 MG capsule Take 600 mg by mouth at bedtime.   Yes [provider]  omeprazole (PRILOSEC) 20 MG capsule Take 20 mg by mouth daily.   Yes [provider]  podofilox (CONDYLOX) 0.5 % external solution Apply 1 application topically once a week.   Yes [provider]  zolpidem (AMBIEN) 10 MG tablet Take 10 mg by mouth at bedtime.   Yes [provider]  diltiazem (CARDIZEM CD) 120 MG 24 hr capsule Take 1 capsule (120 mg total) by mouth daily. Patient not taking: Reported on 06/25/2020 11/02/19  Kathleene Hazel, MD   Allergies  Allergen Reactions  . Zoloft [Sertraline]     Altered mental state    Social History   Tobacco Use  . Smoking status: Current Every Day Smoker    Packs/day: 1.00    Types: Cigarettes  . Smokeless tobacco: Never Used  Substance Use Topics  . Alcohol use: Yes    Comment: occasional    Family History  Problem Relation Age of Onset  . Hypertension Father   . Heart disease Father      Review of Systems  Positive ROS: neg  All other systems have been reviewed and were otherwise negative with the exception of those mentioned in the  HPI and as above.  Objective: Vital signs in last 24 hours: Temp:  [98 F (36.7 C)] 98 F (36.7 C) (12/17 0559) Pulse Rate:  [87] 87 (12/17 0559) Resp:  [20] 20 (12/17 0559) BP: (121)/(78) 121/78 (12/17 0559) SpO2:  [95 %] 95 % (12/17 0559) Weight:  [70.3 kg] 70.3 kg (12/17 0559)  General Appearance: Alert, cooperative, no distress, appears stated age Head: Normocephalic, without obvious abnormality, atraumatic Eyes: PERRL, conjunctiva/corneas clear, EOM's intact    Neck: Supple, symmetrical, trachea midline Back: Symmetric, no curvature, ROM normal, no CVA tenderness Lungs:  respirations unlabored Heart: Regular rate and rhythm Abdomen: Soft, non-tender Extremities: Extremities normal, atraumatic, no cyanosis or edema Pulses: 2+ and symmetric all extremities Skin: Skin color, texture, turgor normal, no rashes or lesions  NEUROLOGIC:   Mental status: Alert and oriented x4,  no aphasia, good attention span, fund of knowledge, and memory Motor Exam - grossly normal Sensory Exam - grossly normal Reflexes: 1+ Coordination - grossly normal Gait - grossly normal Balance - grossly normal Cranial Nerves: I: smell Not tested  II: visual acuity  OS: nl    OD: nl  II: visual fields Full to confrontation  II: pupils Equal, round, reactive to light  III,VII: ptosis None  III,IV,VI: extraocular muscles  Full ROM  V: mastication Normal  V: facial light touch sensation  Normal  V,VII: corneal reflex  Present  VII: facial muscle function - upper  Normal  VII: facial muscle function - lower Normal  VIII: hearing Not tested  IX: soft palate elevation  Normal  IX,X: gag reflex Present  XI: trapezius strength  5/5  XI: sternocleidomastoid strength 5/5  XI: neck flexion strength  5/5  XII: tongue strength  Normal    Data Review Lab Results  Component Value Date   WBC 8.6 06/25/2020   HGB 14.1 06/25/2020   HCT 44.1 06/25/2020   MCV 93.2 06/25/2020   PLT 255 06/25/2020   Lab  Results  Component Value Date   NA 135 06/25/2020   K 4.8 06/25/2020   CL 99 06/25/2020   CO2 28 06/25/2020   BUN 7 06/25/2020   CREATININE 0.73 06/25/2020   GLUCOSE 115 (H) 06/25/2020   Lab Results  Component Value Date   INR 1.0 06/25/2020    Assessment/Plan:  Estimated body mass index is 27.9 kg/m as calculated from the following:   Height as of this encounter: 5' 2.5" (1.588 m).   Weight as of this encounter: 70.3 kg. Patient admitted for ALF L5-S1 and R CTR. Patient has failed a reasonable attempt at conservative therapy.  I explained the condition and procedure to the patient and answered any questions.  Patient wishes to proceed with procedure as planned. Understands risks/ benefits and typical outcomes of procedure.  Tia Alert 06/28/2020 7:24 AM

## 2020-06-28 NOTE — Transfer of Care (Signed)
Immediate Anesthesia Transfer of Care Note  Patient: [name]  Procedure(s) Performed: ANTERIOR LUMBAR INTERBODY FUSION LUMBAR FIVE-SACRAL ONE. (N/A Spine Lumbar) CARPAL TUNNEL RELEASE (Right Wrist) ABDOMINAL EXPOSURE (N/A Abdomen)  Patient Location: PACU  Anesthesia Type:General  Level of Consciousness: awake, alert , oriented and patient cooperative  Airway & Oxygen Therapy: Patient Spontanous Breathing and Patient connected to face mask oxygen  Post-op Assessment: Report given to RN and Post -op Vital signs reviewed and stable  Post vital signs: Reviewed and stable  Last Vitals:  Vitals Value Taken Time  BP 125/77 06/28/20 1035  Temp 36.5 C 06/28/20 1035  Pulse 87 06/28/20 1042  Resp 31 06/28/20 1042  SpO2 95 % 06/28/20 1042  Vitals shown include unvalidated device data.  Last Pain:  Vitals:   06/28/20 0604  TempSrc:   PainSc: 0-No pain         Complications: No complications documented.

## 2020-06-28 NOTE — Progress Notes (Signed)
Orthopedic Tech Progress Note Patient Details:  [name] 1971-05-14 [id number] Ordered brace Patient ID: [name], female   DOB: Aug 01, 1970, 49 y.o.   MRN: [id number]   [name] 06/28/2020, 3:46 PM

## 2020-06-28 NOTE — Evaluation (Signed)
Physical Therapy Evaluation Patient Details Name: [name] MRN: [id number] DOB: 06-Apr-1971 Today's Date: 06/28/2020   History of Present Illness  Pt adm for L5-S1 anterior lumbar fusion and rt carpal tunnel release. PMH - OA, ADHD, anxiety, fibromyalgia.  Clinical Impression  Pt doing well with mobility and no further PT needed.  Ready for dc from PT standpoint.      Follow Up Recommendations No PT follow up    Equipment Recommendations  None recommended by PT    Recommendations for Other Services       Precautions / Restrictions Precautions Precautions: Back Precaution Booklet Issued: Yes (comment) Required Braces or Orthoses: Spinal Brace Spinal Brace: Thoracolumbosacral orthotic;Applied in standing position      Mobility  Bed Mobility Overal bed mobility: Independent                 Transfers Overall transfer level: Independent Equipment used: None                Ambulation/Gait Ambulation/Gait assistance: Independent Gait Distance (Feet): 300 Feet Assistive device: None Gait Pattern/deviations: WFL(Within Functional Limits) Gait velocity: fast Gait velocity interpretation: >4.37 ft/sec, indicative of normal walking speed General Gait Details: steady gait  Stairs Stairs: Yes Stairs assistance: Modified independent (Device/Increase time) Stair Management: One rail Left;Alternating pattern;Forwards;Backwards (Up forwards/down backwards due to IV) Number of Stairs: 10 General stair comments: No difficulty  Wheelchair Mobility    Modified Rankin (Stroke Patients Only)       Balance Overall balance assessment: Independent                                           Pertinent Vitals/Pain Pain Assessment: No/denies pain    Home Living Family/patient expects to be discharged to:: Private residence Living Arrangements: Spouse/significant other Available Help at Discharge: Family Type of Home: House Home  Access: Stairs to enter Entrance Stairs-Rails: Left Entrance Stairs-Number of Steps: 5-6 Home Layout: Multi-level Home Equipment: None      Prior Function Level of Independence: Independent               Hand Dominance   Dominant Hand: Right    Extremity/Trunk Assessment   Upper Extremity Assessment Upper Extremity Assessment: Defer to OT evaluation    Lower Extremity Assessment Lower Extremity Assessment: Overall WFL for tasks assessed       Communication   Communication: No difficulties  Cognition Arousal/Alertness: Awake/alert Behavior During Therapy: WFL for tasks assessed/performed;Impulsive Overall Cognitive Status: Within Functional Limits for tasks assessed                                 General Comments: Baseline ADHD      General Comments General comments (skin integrity, edema, etc.): Pt's best friend present throughout session    Exercises     Assessment/Plan    PT Assessment Patent does not need any further PT services  PT Problem List         PT Treatment Interventions      PT Goals (Current goals can be found in the Care Plan section)  Acute Rehab PT Goals PT Goal Formulation: All assessment and education complete, DC therapy    Frequency     Barriers to discharge        Co-evaluation  AM-PAC PT "6 Clicks" Mobility  Outcome Measure Help needed turning from your back to your side while in a flat bed without using bedrails?: None Help needed moving from lying on your back to sitting on the side of a flat bed without using bedrails?: None Help needed moving to and from a bed to a chair (including a wheelchair)?: None Help needed standing up from a chair using your arms (e.g., wheelchair or bedside chair)?: None Help needed to walk in hospital room?: None Help needed climbing 3-5 steps with a railing? : None 6 Click Score: 24    End of Session   Activity Tolerance: Patient tolerated treatment  well Patient left: Other (comment) (standing in room with OT) Nurse Communication: Mobility status PT Visit Diagnosis: Other abnormalities of gait and mobility (R26.89)    Time: 1600-1610 PT Time Calculation (min) (ACUTE ONLY): 10 min   Charges:   PT Evaluation $PT Eval Low Complexity: 1 Low          [facility] PT Acute Rehabilitation Services Pager [phone] Office [phone]   Angelina Ok [facility] 06/28/2020, 4:59 PM

## 2020-06-28 NOTE — Evaluation (Signed)
Occupational Therapy Evaluation Patient Details Name: [name] MRN: [id number] DOB: [birth year] Today's Date: 06/28/2020    History of Present Illness Pt adm for L5-S1 anterior lumbar fusion and rt carpal tunnel release. PMH - OA, ADHD, anxiety, fibromyalgia.   Clinical Impression   PTA, pt was living with her husband and was independent. Currently, pt performing ADLs and functional mobility at Mod I - Independent level. Providing handout and education on back precautions, bed mobility, brace management, UB ADLs, LB ADLs, grooming, toileting, and shower transfer. Pt demonstrating and verbalizing understanding.  Answered all pt questions. Recommend dc home once medically stable per physician. All acute OT needs met and will sign off. Thank you.    Follow Up Recommendations  No OT follow up    Equipment Recommendations  None recommended by OT    Recommendations for Other Services PT consult     Precautions / Restrictions Precautions Precautions: Back Precaution Booklet Issued: Yes (comment) Precaution Comments: Reviewed back precautions and compensatory techinque Required Braces or Orthoses: Spinal Brace Spinal Brace: Thoracolumbosacral orthotic;Applied in standing position      Mobility Bed Mobility Overal bed mobility: Independent             General bed mobility comments: Reviewed log roll technique    Transfers Overall transfer level: Independent Equipment used: None                  Balance Overall balance assessment: Independent                                         ADL either performed or assessed with clinical judgement   ADL Overall ADL's : Modified independent                                       General ADL Comments: Providing handout and education on back precautions, bed mobility, brace management, UB ADLs, LB ADLs, grooming, toileting, and shower transfer. Pt demonstrating and verbalizing  understanding.     Vision Baseline Vision/History: Wears glasses Wears Glasses: Reading only Patient Visual Report: No change from baseline       Perception     Praxis      Pertinent Vitals/Pain Pain Assessment: No/denies pain     Hand Dominance Right   Extremity/Trunk Assessment Upper Extremity Assessment Upper Extremity Assessment: Overall WFL for tasks assessed   Lower Extremity Assessment Lower Extremity Assessment: Defer to PT evaluation   Cervical / Trunk Assessment Cervical / Trunk Assessment: Other exceptions Cervical / Trunk Exceptions: s/p lumbar sx   Communication Communication Communication: No difficulties   Cognition Arousal/Alertness: Awake/alert Behavior During Therapy: WFL for tasks assessed/performed;Impulsive Overall Cognitive Status: Within Functional Limits for tasks assessed                                 General Comments: Baseline ADHD   General Comments  Pt's best friend present throughout session    Exercises     Shoulder Instructions      Home Living Family/patient expects to be discharged to:: Private residence Living Arrangements: Spouse/significant other Available Help at Discharge: Family Type of Home: House Home Access: Stairs to enter [employer] of Steps: 5-6 Entrance Stairs-Rails: Left Home Layout: Multi-level Alternate Level  Stairs-Number of Steps: 16 Alternate Level Stairs-Rails: Left Bathroom Shower/Tub: Occupational psychologist: Standard     Home Equipment: None          Prior Functioning/Environment Level of Independence: Independent                 OT Problem List: Decreased activity tolerance;Impaired balance (sitting and/or standing)      OT Treatment/Interventions:      OT Goals(Current goals can be found in the care plan section) Acute Rehab OT Goals Patient Stated Goal: Go home OT Goal Formulation: All assessment and education complete, DC therapy  OT  Frequency:     Barriers to D/C:            Co-evaluation              AM-PAC OT "6 Clicks" Daily Activity     Outcome Measure Help from another person eating meals?: None Help from another person taking care of personal grooming?: None Help from another person toileting, which includes using toliet, bedpan, or urinal?: None Help from another person bathing (including washing, rinsing, drying)?: None Help from another person to put on and taking off regular upper body clothing?: None Help from another person to put on and taking off regular lower body clothing?: None 6 Click Score: 24   End of Session Equipment Utilized During Treatment: Back brace Nurse Communication: Mobility status  Activity Tolerance: Patient tolerated treatment well Patient left: in bed;with call bell/phone within reach;with family/visitor present  OT Visit Diagnosis: Unsteadiness on feet (R26.81);Other abnormalities of gait and mobility (R26.89);Muscle weakness (generalized) (M62.81)                Time: 5726-2035 OT Time Calculation (min): 17 min Charges:  OT General Charges $OT Visit: 1 Visit OT Evaluation $OT Eval Low Complexity: 1 Low  [name] MSOT, OTR/L Acute Rehab Pager: [phone] Office: [place] 06/28/2020, 5:10 PM

## 2020-06-28 NOTE — Discharge Instructions (Signed)

## 2020-06-28 NOTE — Progress Notes (Signed)
Patient is discharged from room 3C07 at this time. Alert and in stable condition. IV site d/c'd and instructions read to patient and friend with understanding verbalized and all questions answered. Left unit via wheelchair with all belongings at side.

## 2020-06-28 NOTE — Anesthesia Postprocedure Evaluation (Signed)
Anesthesia Post Note  Patient: [name]  Procedure(s) Performed: ANTERIOR LUMBAR INTERBODY FUSION LUMBAR FIVE-SACRAL ONE. (N/A Spine Lumbar) CARPAL TUNNEL RELEASE (Right Wrist) ABDOMINAL EXPOSURE (N/A Abdomen)     Patient location during evaluation: PACU Anesthesia Type: General Level of consciousness: awake and alert Pain management: pain level controlled Vital Signs Assessment: post-procedure vital signs reviewed and stable Respiratory status: spontaneous breathing, nonlabored ventilation, respiratory function stable and patient connected to nasal cannula oxygen Cardiovascular status: blood pressure returned to baseline and stable Postop Assessment: no apparent nausea or vomiting Anesthetic complications: no   No complications documented.  Last Vitals:  Vitals:   06/28/20 1120 06/28/20 1134  BP:  112/77  Pulse:  81  Resp:  18  Temp: 36.4 C (!) 36.4 C  SpO2:  97%    Last Pain:  Vitals:   06/28/20 1245  TempSrc:   PainSc: 7                  Rolena Knutson L Mekiah [place]

## 2020-06-28 NOTE — Discharge Summary (Signed)
Physician Discharge Summary  Patient ID: [name] MRN: [id number] DOB/AGE: 08-26-1970 49 y.o.  Admit date: 06/28/2020 Discharge date: 06/28/2020  Admission Diagnoses: 1.  Degenerative spondylolisthesis L5-S1 with back and leg pain, 2.  Right carpal tunnel syndrome   Discharge Diagnoses: same   Discharged Condition: good  Hospital Course: The patient was admitted on 06/28/2020 and taken to the operating room where the patient underwent ALIF L5-S1 and right CTR. The patient tolerated the procedure well and was taken to the recovery room and then to the floor in stable condition. The hospital course was routine. There were no complications. The wound remained clean dry and intact. Pt had appropriate back soreness. No complaints of leg pain or new N/T/W. The patient remained afebrile with stable vital signs, and tolerated a regular diet. The patient continued to increase activities, and pain was well controlled with oral pain medications.   Consults: None  Significant Diagnostic Studies:  Results for orders placed or performed during the hospital encounter of 06/28/20  Pregnancy, urine POC  Result Value Ref Range   Preg Test, Ur NEGATIVE NEGATIVE  ABO/Rh  Result Value Ref Range   ABO/RH(D)      O POS Performed at [facility], [address], [place], [place] [address]     Chest 2 View  Result Date: 06/25/2020 CLINICAL DATA:  Preoperative films.  Patient for spine surgery. EXAM: CHEST - 2 VIEW COMPARISON:  PA and lateral chest 06/13/2010. FINDINGS: The lungs are clear. Heart size is normal. No pneumothorax or pleural fluid. Cervical fusion hardware and cholecystectomy clips are noted. IMPRESSION: No acute disease. Electronically Signed   By: Drusilla Kanner M.D.   On: 06/25/2020 16:41   DG Lumbar Spine 2-3 Views  Result Date: 06/28/2020 CLINICAL DATA:  49 year old female undergoing lumbar surgery. EXAM: DG C-ARM 1-60 MIN; LUMBAR SPINE - 2-3 VIEW FLUOROSCOPY TIME:   Fluoroscopy Time:  0 minutes 47 seconds Radiation Exposure Index (if provided by the fluoroscopic device): Number of Acquired Spot Images: 0 COMPARISON:  CT lumbar spine 05/07/2020. FINDINGS: Normal lumbar segmentation on the comparison. Two intraoperative fluoroscopic spot views of the lumbosacral junction in the AP and lateral projection demonstrate anterior interbody implant placement at L5-S1. No adverse hardware features. IMPRESSION: Anterior approach L5-S1 interbody fusion depicted. Electronically Signed   By: Odessa Fleming M.D.   On: 06/28/2020 10:52   DG C-Arm 1-60 Min  Result Date: 06/28/2020 CLINICAL DATA:  49 year old female undergoing lumbar surgery. EXAM: DG C-ARM 1-60 MIN; LUMBAR SPINE - 2-3 VIEW FLUOROSCOPY TIME:  Fluoroscopy Time:  0 minutes 47 seconds Radiation Exposure Index (if provided by the fluoroscopic device): Number of Acquired Spot Images: 0 COMPARISON:  CT lumbar spine 05/07/2020. FINDINGS: Normal lumbar segmentation on the comparison. Two intraoperative fluoroscopic spot views of the lumbosacral junction in the AP and lateral projection demonstrate anterior interbody implant placement at L5-S1. No adverse hardware features. IMPRESSION: Anterior approach L5-S1 interbody fusion depicted. Electronically Signed   By: Odessa Fleming M.D.   On: 06/28/2020 10:52   DG OR LOCAL ABDOMEN  Result Date: 06/28/2020 CLINICAL DATA:  49 year old female with a history possible surgical foreign body EXAM: OR LOCAL ABDOMEN COMPARISON:  None. FINDINGS: Surgical change of the L5-S1 level. No unexpected radiopaque foreign body. IMPRESSION: No unexpected radiopaque foreign body, with surgical changes at L5-S1. These results were called by telephone at the time of interpretation on 06/28/2020 at 9:55 am to circulator in OR 20, Lori. Electronically Signed   By:  Gilmer Mor D.O.   On: 06/28/2020 09:55    Antibiotics:  Anti-infectives (From admission, onward)   Start     Dose/Rate Route Frequency Ordered Stop    06/28/20 1600  ceFAZolin (ANCEF) IVPB 2g/100 mL premix        2 g 200 mL/hr over 30 Minutes Intravenous Every 8 hours 06/28/20 1131 06/29/20 0759   06/28/20 0600  ceFAZolin (ANCEF) IVPB 2g/100 mL premix        2 g 200 mL/hr over 30 Minutes Intravenous On call to O.R. 06/28/20 0550 06/28/20 0750      Discharge Exam: Blood pressure 112/77, pulse 81, temperature (!) 97.5 F (36.4 C), temperature source Oral, resp. rate 18, height 5' 2.5" (1.588 m), weight 70.3 kg, SpO2 97 %. Neurologic: Grossly normal Ambulating and voiding well, incision cdi  Discharge Medications:   Allergies as of 06/28/2020      Reactions   Zoloft [sertraline]    Altered mental state      Medication List    TAKE these medications   albuterol 108 (90 Base) MCG/ACT inhaler Commonly known as: VENTOLIN HFA Inhale 2 puffs into the lungs every 4 (four) hours as needed for wheezing or shortness of breath.   amphetamine-dextroamphetamine 30 MG 24 hr capsule Commonly known as: ADDERALL XR Take 60 mg by mouth daily.   aspirin-acetaminophen-caffeine 250-250-65 MG tablet Commonly known as: EXCEDRIN MIGRAINE Take 2 tablets by mouth every 6 (six) hours as needed for headache.   cholecalciferol 25 MCG (1000 UNIT) tablet Commonly known as: VITAMIN D3 Take 1,000 Units by mouth daily.   [employer] VITAMIN B12 1000 MCG tablet Generic drug: cyanocobalamin Take 1,000 mcg by mouth daily.   diltiazem 120 MG 24 hr capsule Commonly known as: CARDIZEM CD Take 1 capsule (120 mg total) by mouth daily.   diphenhydrAMINE 25 MG tablet Commonly known as: BENADRYL Take 25 mg by mouth every 6 (six) hours as needed for allergies.   escitalopram 10 MG tablet Commonly known as: LEXAPRO Take 10 mg by mouth daily as needed (anxiety).   gabapentin 100 MG capsule Commonly known as: NEURONTIN Take 100-600 mg by mouth 4 (four) times daily as needed (pain).   gabapentin 300 MG capsule Commonly known as: NEURONTIN Take 600 mg by mouth at  bedtime.   methocarbamol 500 MG tablet Commonly known as: Robaxin Take 1 tablet (500 mg total) by mouth 4 (four) times daily.   omeprazole 20 MG capsule Commonly known as: PRILOSEC Take 20 mg by mouth daily.   oxyCODONE-acetaminophen 10-325 MG tablet Commonly known as: Percocet Take 1 tablet by mouth every 6 (six) hours as needed for pain.   podofilox 0.5 % external solution Commonly known as: CONDYLOX Apply 1 application topically once a week.   zolpidem 10 MG tablet Commonly known as: AMBIEN Take 10 mg by mouth at bedtime.            Durable Medical Equipment  (From admission, onward)         Start     Ordered   06/28/20 1132  DME Walker rolling  Once       Question:  Patient needs a walker to treat with the following condition  Answer:  S/P lumbar fusion   06/28/20 1131   06/28/20 1132  DME 3 n 1  Once        06/28/20 1131          Disposition: home   Final Dx: ALIF L5-S1 and right CTR  Discharge Instructions     Remove dressing in 72 hours   Complete by: As directed    Call MD for:  difficulty breathing, headache or visual disturbances   Complete by: As directed    Call MD for:  hives   Complete by: As directed    Call MD for:  persistant dizziness or light-headedness   Complete by: As directed    Call MD for:  persistant nausea and vomiting   Complete by: As directed    Call MD for:  redness, tenderness, or signs of infection (pain, swelling, redness, odor or green/yellow discharge around incision site)   Complete by: As directed    Call MD for:  severe uncontrolled pain   Complete by: As directed    Call MD for:  temperature >100.4   Complete by: As directed    Diet - low sodium heart healthy   Complete by: As directed    Driving Restrictions   Complete by: As directed    No driving for 2 weeks, no riding in the car for 1 week   Increase activity slowly   Complete by: As directed    Lifting restrictions   Complete by: As directed    No  lifting more than 8 lbs         Signed: Tiana Loft Ellorie Kindall 06/28/2020, 3:22 PM

## 2020-06-28 NOTE — Op Note (Signed)
Date: June 28, 2020  Preoperative diagnosis: Chronic lower back pain with degenerative disc disease  Postoperative diagnosis: Same  Procedure: Anterior spine exposure at the L5-S1 disc space via anterior retroperitoneal approach for L5-S1 ALIF  Surgeon: Dr. Cephus Shelling, MD  Co-surgeon: Dr. Marikay Alar, MD  Indications: Patient is a 49 year old female with chronic lower back pain and degenerative disc disease.  She has imaging showing spondylolisthesis of L5-S1.  She has been evaluated by Dr. Yetta Barre with neurosurgery who has recommended an anterior L5-S1 ALIF.  She presents today after risk benefits discussed.  Findings: Transverse incision over the left rectus muscle where we had marked out the L5-S1 disc space.  Ultimately the left rectus muscle was circumferentially mobilized and entered the retroperitoneum lateral to the muscle and the peritoneum and ureter were mobilized across midline.  The middle sacral vessels were divided between clips.  Fixed retractors were placed to expose the L5-S1 disc spase from anterior approach and we confirmed we were at the correct level with a spinal needle in the disc space on lateral fluorsocpy.  Anesthesia: General  Details: Patient was taken to the operating room after informed consent was obtained.  Placed on the operative table in supine position.  General endotracheal anesthesia was induced.  Ultimately fluoroscopic C-arm was brought in the lateral position to identify the L5-S1 disc space that was marked on the anterior abdominal wall over the left rectus muscle.  That point in time the abdomen was prepped and draped in the usual sterile fashion.  A timeout was performed to identify patient, procedure, and site.  Initially made a transverse incision over the mark over the left rectus muscle.  Dissected down with Bovie cautery and encountered the anterior rectus sheath.  Cerebellar retractors were used for added visualization.  Anterior rectus  sheath was opened transversely with Bovie cautery.  I then raised small flaps underneath the anterior rectus sheath.  The left rectus muscle circumferentially mobilized.  I then entered the retroperitoneum lateral to the muscle and the peritoneum and left ureter were mobilized toward midline bluntly with a KD.  In the process we did mobilize the left rectus lateral while pulling the peritoneum medial with hand-held Wiley retractors.  Ultimately her peritoneum was fairly stuck in the retroperitoneum and the tissue planes were less obvious.  Finally I was able to adequately mobilize enough peritoneum across midline and I could identify the L5-S1 disc space.  I then used vessel clips and divided the middle sacral vessels.  I then mobilized on each side of the disc space with KD and suction and used a Balfour in the wound for added visualization.  Once the disc space was adequately visualized, I placed a fixed Thompson retractor.  150 reverse lips were placed on each side of the disc space with fixed malleable retractors cranial caudal.  We then put a spinal needle in the disc space and confirmed on lateral fluoroscopy that we were at the correct L5-S1 level.  The case was then turned over to Dr. Yetta Barre.  Complication: None  Condition: Stable  Cephus Shelling, MD [facility] Office: [phone]   Cephus Shelling

## 2020-06-28 NOTE — Anesthesia Procedure Notes (Signed)
Procedure Name: Intubation Performed by: Leshae Mcclay, CRNA Pre-anesthesia Checklist: Patient identified, Emergency Drugs available, Suction available and Patient being monitored Patient Re-evaluated:Patient Re-evaluated prior to induction Oxygen Delivery Method: Circle system utilized Preoxygenation: Pre-oxygenation with 100% oxygen Induction Type: IV induction Ventilation: Mask ventilation without difficulty Laryngoscope Size: Miller and 2 Grade View: Grade I Tube type: Oral Tube size: 7.0 mm Number of attempts: 1 Airway Equipment and Method: Stylet and Oral airway Placement Confirmation: ETT inserted through vocal cords under direct vision,  positive ETCO2 and breath sounds checked- equal and bilateral Secured at: 21 cm Tube secured with: Tape Dental Injury: Teeth and Oropharynx as per pre-operative assessment        

## 2020-06-28 NOTE — Op Note (Signed)
06/28/2020  10:38 AM  PATIENT:  [name]  49 y.o. female  PRE-OPERATIVE DIAGNOSIS:  1.  Degenerative spondylolisthesis L5-S1 with back and leg pain, 2.  Right carpal tunnel syndrome  POST-OPERATIVE DIAGNOSIS:  same  PROCEDURE: 1. Anterior lumbar interbody fusion L5-S1 utilizing a globus interbody cage packed with morselized allograft, 2.  Right carpal tunnel release  SURGEON:  Marikay Alar, MD  Co-surgeon: Dr. Chestine Spore  ASSISTANTS: Verlin Dike, FNP  ANESTHESIA:   General  EBL: 100 ml  Total I/O In: 900 [I.V.:800; IV Piggyback:100] Out: 300 [Urine:200; Blood:100]  BLOOD ADMINISTERED: none  DRAINS: None   SPECIMEN:  none  INDICATION FOR PROCEDURE: This patient presented with back and bilateral leg pain.  Also complained of numbness and tingling in her hands imaging showed degenerative spondylolisthesis L5-S1 that was stable in flexion and extension.  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 Pain was debilitating. Recommended anterior lumbar interbody fusion L5-S1 and a right carpal tunnel release. Patient understood the risks, benefits, and alternatives and potential outcomes and wished to proceed.  PROCEDURE DETAILS: The patient was taken to the operating room and after induction of adequate generalized endotracheal anesthesia she was placed in the supine position on the operating table.  Her abdomen was cleaned and then prepped with DuraPrep and draped in usual sterile fashion.  The closure was performed by Dr. Chestine Spore of vascular surgery and that will be described in a separate operative report.  Once the exposure was completed we placed a needle in the disc base to mark the midline.  We checked to make sure that there are correct level with AP and lateral fluoroscopy.  We then incised the disc base and perform the initial discectomy with pituitary rongeurs and curved curettes.  We released the disc  from the endplates with a Cobb elevator.  We then used distraction to distract the endplates and we drilled the endplates slightly with a high-speed drill to prepare for arthrodesis.  We then used trials to determine the size of the interbody implant.  The first trial, the 11 mm 8 degree medium seem to have an excellent fit.  Therefore we packed a corresponding cage with morselized allograft and placed it on the inserter.  We then placed the cage into the disc base utilizing lateral fluoroscopy.  We then secured the cage into place with 1 anchor into the L5 vertebral body and 2 into the sacrum.  We remove the inserter, we then locked our anchors into place by the locking mechanism on the plate.  We then checked our construct with AP and lateral fluoroscopy.  We considered placing an anterior plate but felt like we had created a stable construct and that the plate was not necessary.  We packed around the cage with more morselized allograft.  We then removed our retractors looking for any bleeding.  We found none.  We checked an AP x-ray to make sure there were no retained instruments or sponges.  We closed the rectus fascia with a running Prolene.  We closed the subcutaneous tissues with 2-0 Vicryl in the subcutaneous tissue with 3-0 Vicryl.  The skin was closed with Dermabond.  A sterile dressing was applied.  The end of the procedure all sponge needle and instrument counts were correct.  We then turned our attention to the carpal tunnel release.  The drapes were removed.  The right arm was extended on the armboard and then prepped circumferentially from the  fingertips to the axilla with DuraPrep.  It was then draped in the usual sterile fashion.  We injected 6 cc of local anesthesia and then made a small palmar incision in the palm from the distal wrist crease into the palm in line with the webspace between the middle and fourth fingers.  We dissected down through the palmar fascia to expose the transverse carpal  ligament.  The ligament was opened with a 15 blade scalpel and then we spread between the ligament and the nerve both proximally and distally and completely transected the ligament to free up the nerve.  Then palpated proximally and distally to assure that the ligament was completely transected and the nerve was free.  We then dried all bleeding points and irrigated with saline solution.  We then closed the palmar fascia with 3-0 Vicryl.  We closed the subcuticular tissue with 3-0 Vicryl.  We closed the skin with interrupted 4-0 Ethilon vertical mattress sutures.  We then wrapped the hand in a Kerlix and an Ace bandage.  The patient was then awakened from general anesthesia and transferred to the recovery room in stable condition.  At the end the procedure all sponge needle instrument counts were once again correct.   PLAN OF CARE: Admit to inpatient   PATIENT DISPOSITION:  PACU - hemodynamically stable.   Delay start of Pharmacological VTE agent (>24hrs) due to surgical blood loss or risk of bleeding:  yes

## 2020-07-01 ENCOUNTER — Encounter (HOSPITAL_COMMUNITY): Payer: Self-pay | Admitting: Neurological Surgery

## 2020-07-01 MED FILL — Sodium Chloride IV Soln 0.9%: INTRAVENOUS | Qty: 1000 | Status: AC

## 2020-07-01 MED FILL — Heparin Sodium (Porcine) Inj 1000 Unit/ML: INTRAMUSCULAR | Qty: 30 | Status: AC

## 2020-10-15 ENCOUNTER — Telehealth: Payer: Self-pay

## 2020-10-15 NOTE — Telephone Encounter (Signed)
Patient called to report right sided abdominal pain after her ALIF surgery and pain down her right leg and that her pelvis goes numb after she drives. Her exposure was performed on the L side. She was given a shot in her leg that helped for a few days but has since worn off. She says she has had GI tests and they are unable to find any problems. Dr. Yetta Barre is unable to really find a cause either. Patient says it was not like this prior to surgery and that "Dr. Chestine Spore was her last hope". She has had either an MRI or CT scan done. Advised her that it did not sound like a vascular problems - and we were unable to see her scans that were done last week in [place]. Asked her to mail them to [place], but she says she can't do that right now - but will try to figure out a way to get them to [place].

## 2020-12-18 ENCOUNTER — Telehealth: Payer: Self-pay

## 2020-12-18 ENCOUNTER — Other Ambulatory Visit: Payer: Self-pay | Admitting: Neurological Surgery

## 2020-12-18 DIAGNOSIS — M5412 Radiculopathy, cervical region: Secondary | ICD-10-CM

## 2020-12-18 NOTE — Progress Notes (Signed)
Phone call to patient to verify medication list and allergies for myelogram procedure. Pt instructed to hold Adderall and Lexapro for 48hrs prior to myelogram appointment time and 24 hours after appointment. Pt also instructed to have a driver the day of the procedure, the procedure would take around 2 hours, and discharge instructions discussed. Pt verbalized understanding.

## 2020-12-23 ENCOUNTER — Ambulatory Visit
Admission: RE | Admit: 2020-12-23 | Discharge: 2020-12-23 | Disposition: A | Discharge status: Home / Self Care | Payer: BC Managed Care – PPO | Source: Ambulatory Visit | Attending: Neurological Surgery | Admitting: Neurological Surgery

## 2020-12-23 DIAGNOSIS — M5412 Radiculopathy, cervical region: Secondary | ICD-10-CM

## 2020-12-23 MED ORDER — DIAZEPAM 5 MG PO TABS
10.0000 mg | ORAL_TABLET | Freq: Once | ORAL | Status: AC
  Administered 2020-12-23: 09:00:00 10 mg via ORAL

## 2020-12-23 MED ORDER — IOPAMIDOL (ISOVUE-M 300) INJECTION 61%
10.0000 mL | Freq: Once | INTRAMUSCULAR | Status: AC | PRN
  Administered 2020-12-23: 10:00:00 10 mL via INTRATHECAL

## 2020-12-23 NOTE — Discharge Instructions (Addendum)
Myelogram Discharge Instructions  Go home and rest quietly as needed. You may resume normal activities; however, do not exert yourself strongly or do any heavy lifting today and tomorrow.   DO NOT drive today.    You may resume your normal diet and medications unless otherwise indicated. Drink lots of extra fluids today and tomorrow.   The incidence of headache, nausea, or vomiting is about 5% (one in 20 patients).  If you develop a headache, lie flat for 24 hours and drink plenty of fluids until the headache goes away.  Caffeinated beverages may be helpful. If when you get up you still have a headache when standing, go back to bed and force fluids for another 24 hours.   If you develop severe nausea and vomiting or a headache that does not go away with the flat bedrest after 48 hours, please call [phone].   Call your physician for a follow-up appointment.  The results of your myelogram will be sent directly to your physician by the following day.  If you have any questions or if complications develop after you arrive home, please call [phone].  Discharge instructions have been explained to the patient.  The patient, or the person responsible for the patient, fully understands these instructions.   Thank you for visiting our office today.    YOU MAY RESUME YOUR LEXAPRO AND ADDERALL TOMORROW 12/24/20 AT 9:30 AM

## 2021-09-16 ENCOUNTER — Other Ambulatory Visit: Payer: Self-pay | Admitting: Physician Assistant

## 2021-09-30 ENCOUNTER — Other Ambulatory Visit: Payer: Self-pay | Admitting: Physician Assistant

## 2021-09-30 DIAGNOSIS — E278 Other specified disorders of adrenal gland: Secondary | ICD-10-CM

## 2021-09-30 DIAGNOSIS — R319 Hematuria, unspecified: Secondary | ICD-10-CM

## 2021-10-02 ENCOUNTER — Other Ambulatory Visit: Payer: Self-pay

## 2021-10-02 ENCOUNTER — Ambulatory Visit
Admission: RE | Admit: 2021-10-02 | Discharge: 2021-10-02 | Disposition: A | Discharge status: Home / Self Care | Payer: BC Managed Care – PPO | Source: Ambulatory Visit | Attending: Physician Assistant | Admitting: Physician Assistant

## 2021-10-02 DIAGNOSIS — E278 Other specified disorders of adrenal gland: Secondary | ICD-10-CM

## 2021-10-02 DIAGNOSIS — R319 Hematuria, unspecified: Secondary | ICD-10-CM

## 2021-10-02 MED ORDER — IOPAMIDOL (ISOVUE-300) INJECTION 61%
100.0000 mL | Freq: Once | INTRAVENOUS | Status: AC | PRN
  Administered 2021-10-02: 100 mL via INTRAVENOUS

## 2021-10-02 MED ORDER — IOPAMIDOL (ISOVUE-300) INJECTION 61%: 100.0000 mL | Freq: Once | INTRAVENOUS | Status: DC | PRN

## 2021-10-06 ENCOUNTER — Other Ambulatory Visit: Payer: Self-pay | Admitting: Urology

## 2021-10-06 DIAGNOSIS — N201 Calculus of ureter: Secondary | ICD-10-CM

## 2021-10-06 MED ORDER — OXYCODONE HCL 5 MG PO TABS: ORAL_TABLET | ORAL | 0 refills | Status: DC

## 2021-11-07 IMAGING — CT CT L SPINE W/O CM
1 of 6 series · 6 of 14 positions shown, 8 images · non-contrast
Comparison: Lumbar spine MRI 12/15/2019

CLINICAL DATA: Chronic low back pain

EXAM:
CT LUMBAR SPINE WITHOUT CONTRAST
TECHNIQUE: Multidetector CT imaging of the lumbar spine was performed without
intravenous contrast administration. Multiplanar CT image
reconstructions were also generated.

[Series 2: l spine soft · axial · 0.29mm/px · z∈[-803,-653]mm · 6 of 70 slices shown, 8 images]
[im 10/70  soft-tissue]
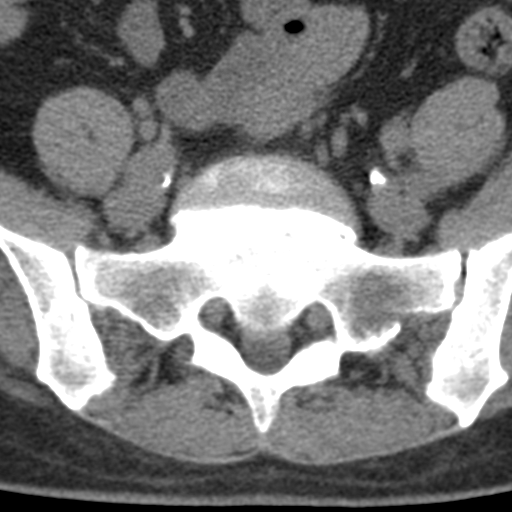
[im 10/70  bone]
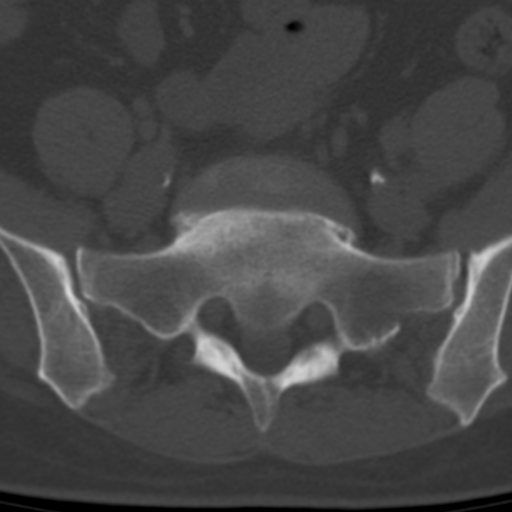
[im 20/70  bone]
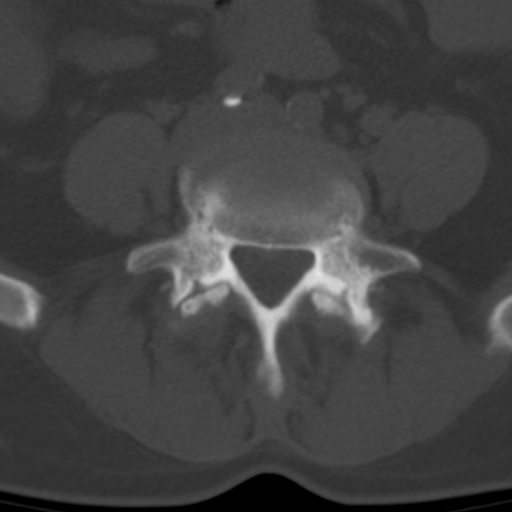
[im 30/70  bone]
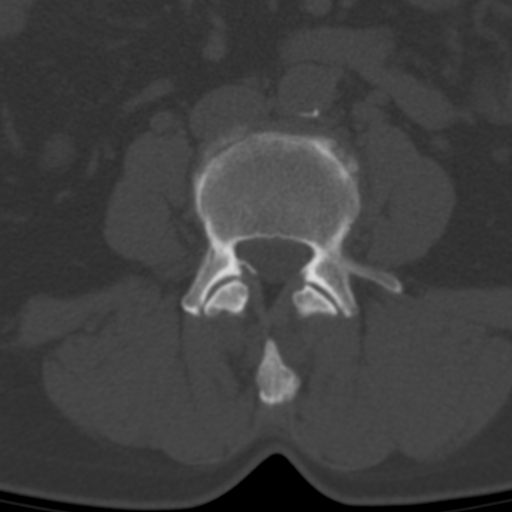
[im 40/70  bone]
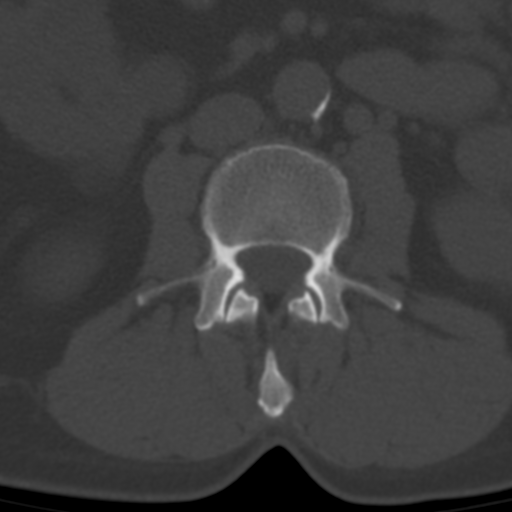
[im 50/70  soft-tissue]
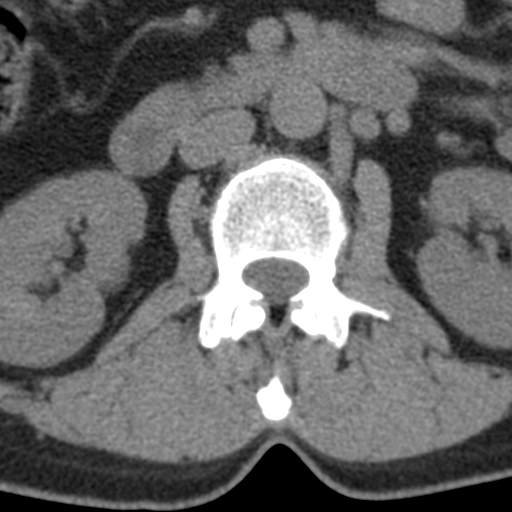
[im 50/70  bone]
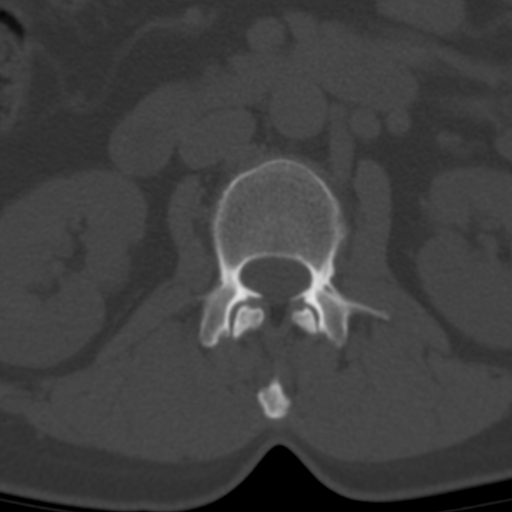
[im 60/70  bone]
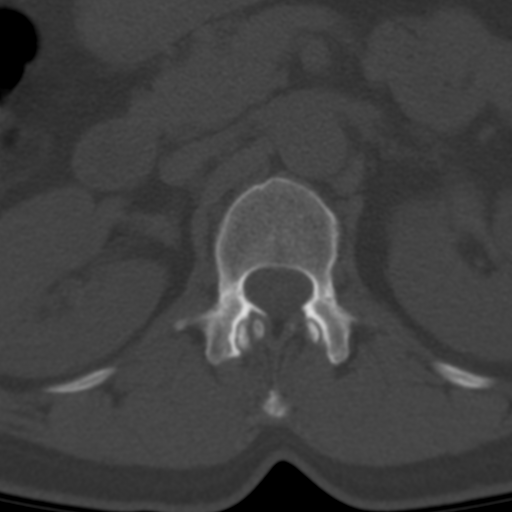

[6 of 14 positions shown; findings below may reference images not displayed]

FINDINGS: Segmentation: Standard

Alignment: Grade 1 anterolisthesis at L5-S1

Vertebrae: No fracture or focal osseous lesion.

Paraspinal and other soft tissues: Calcific aortic atherosclerosis

Disc levels:

L4-5: Moderate facet hypertrophy.  No spinal canal stenosis.

L5-S1: Severe facet hypertrophy with grade 1 anterolisthesis and
disc uncovering. Severe bilateral neural foraminal stenosis. No
central spinal canal stenosis.
IMPRESSION: 1. Severe bilateral L5-S1 facet hypertrophy with grade 1
anterolisthesis, disc uncovering and severe bilateral neural
foraminal stenosis.

Aortic Atherosclerosis ([id number]).

## 2021-12-29 IMAGING — RF DG LUMBAR SPINE 2-3V
1 series · 2 of 2 positions shown · non-contrast
Comparison: CT lumbar spine 05/07/2020.

CLINICAL DATA: 49-year-old female undergoing lumbar surgery.

EXAM:
DG C-ARM 1-60 MIN; LUMBAR SPINE - 2-3 VIEW
FLUOROSCOPY TIME:  Fluoroscopy Time:  0 minutes 47 seconds
Radiation Exposure Index (if provided by the fluoroscopic device):
Number of Acquired Spot Images: 0

[Series 1: run · 2 of 2 slices shown]
[im 1/2]
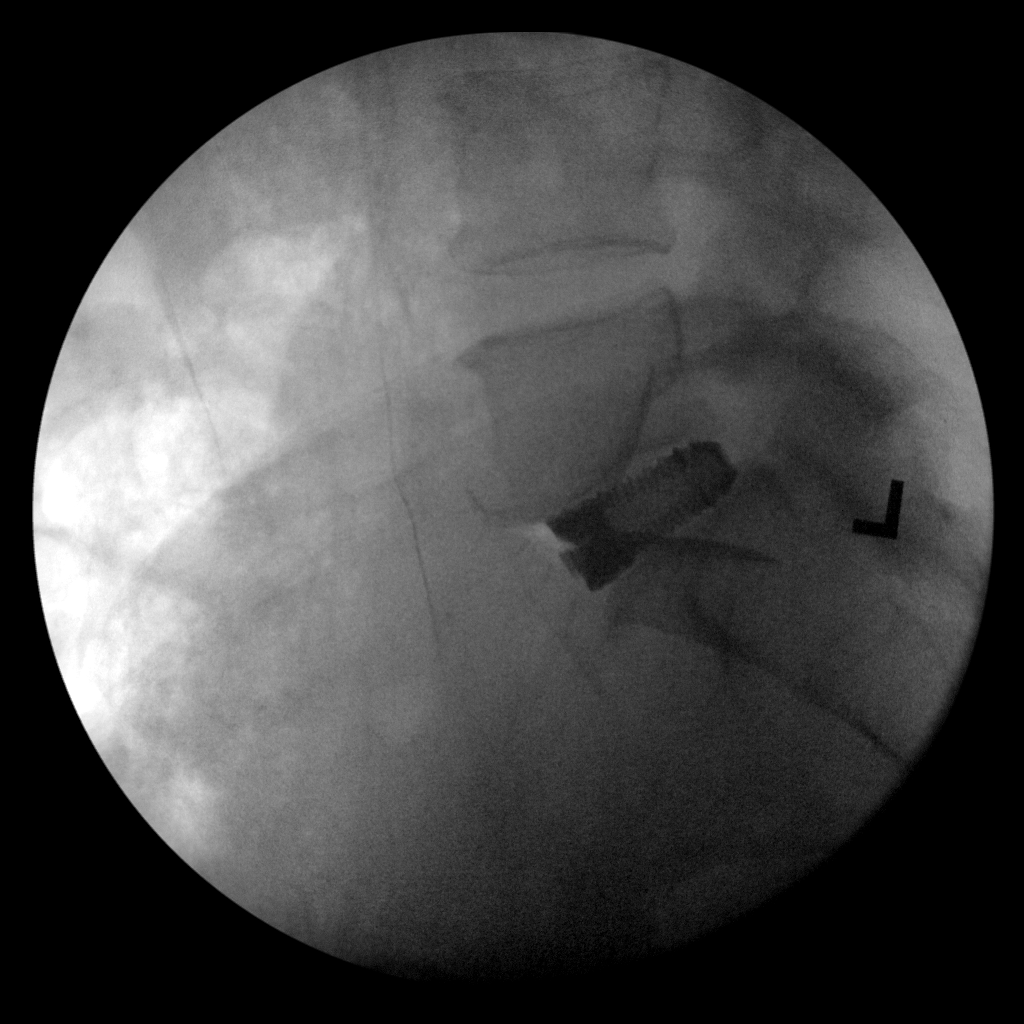
[im 2/2]
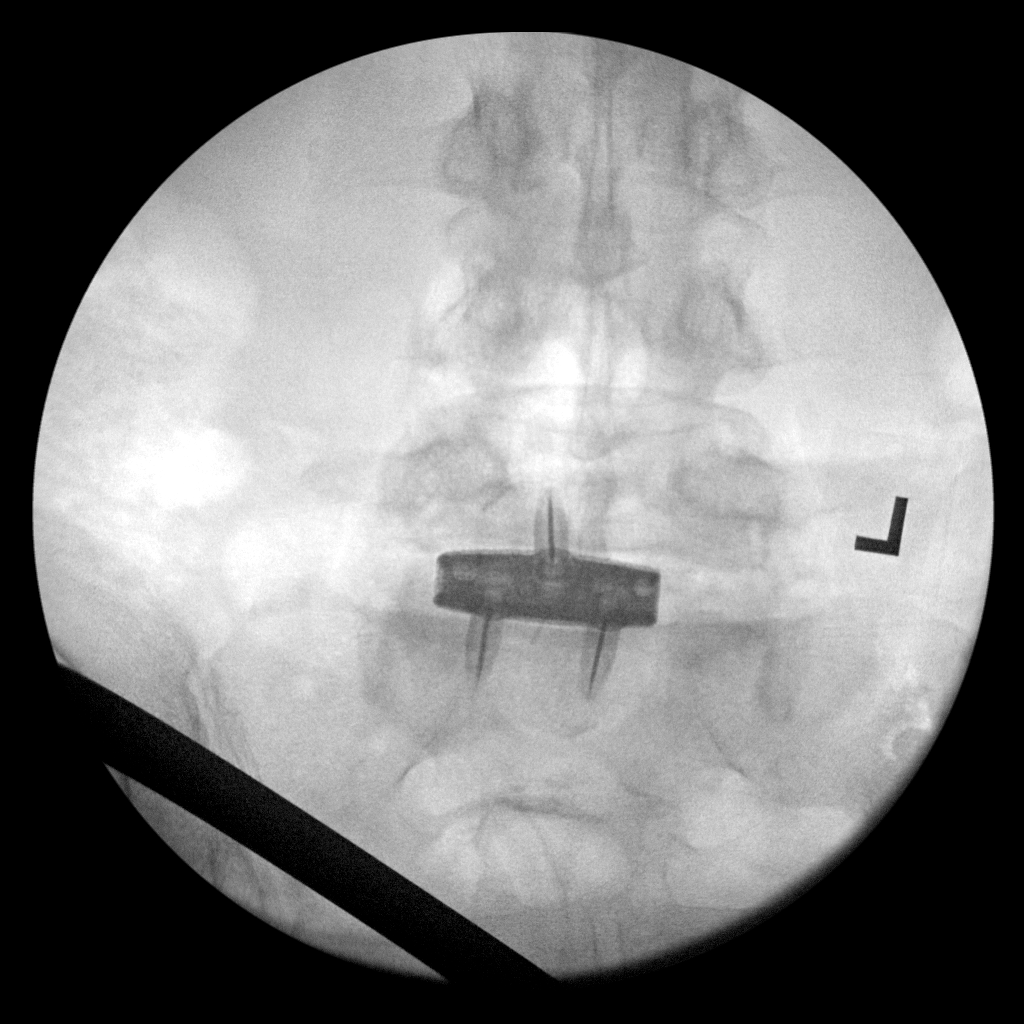

[2 of 2 positions shown; findings below may reference images not displayed]

FINDINGS: Normal lumbar segmentation on the comparison. Two intraoperative
fluoroscopic spot views of the lumbosacral junction in the AP and
lateral projection demonstrate anterior interbody implant placement
at L5-S1. No adverse hardware features.
IMPRESSION: Anterior approach L5-S1 interbody fusion depicted.

## 2021-12-29 IMAGING — RF DG C-ARM 1-60 MIN
1 series · 2 of 2 positions shown · non-contrast
Comparison: CT lumbar spine 05/07/2020.

CLINICAL DATA: 49-year-old female undergoing lumbar surgery.

EXAM:
DG C-ARM 1-60 MIN; LUMBAR SPINE - 2-3 VIEW
FLUOROSCOPY TIME:  Fluoroscopy Time:  0 minutes 47 seconds
Radiation Exposure Index (if provided by the fluoroscopic device):
Number of Acquired Spot Images: 0

[Series 1: run · 2 of 2 slices shown]
[im 1/2]
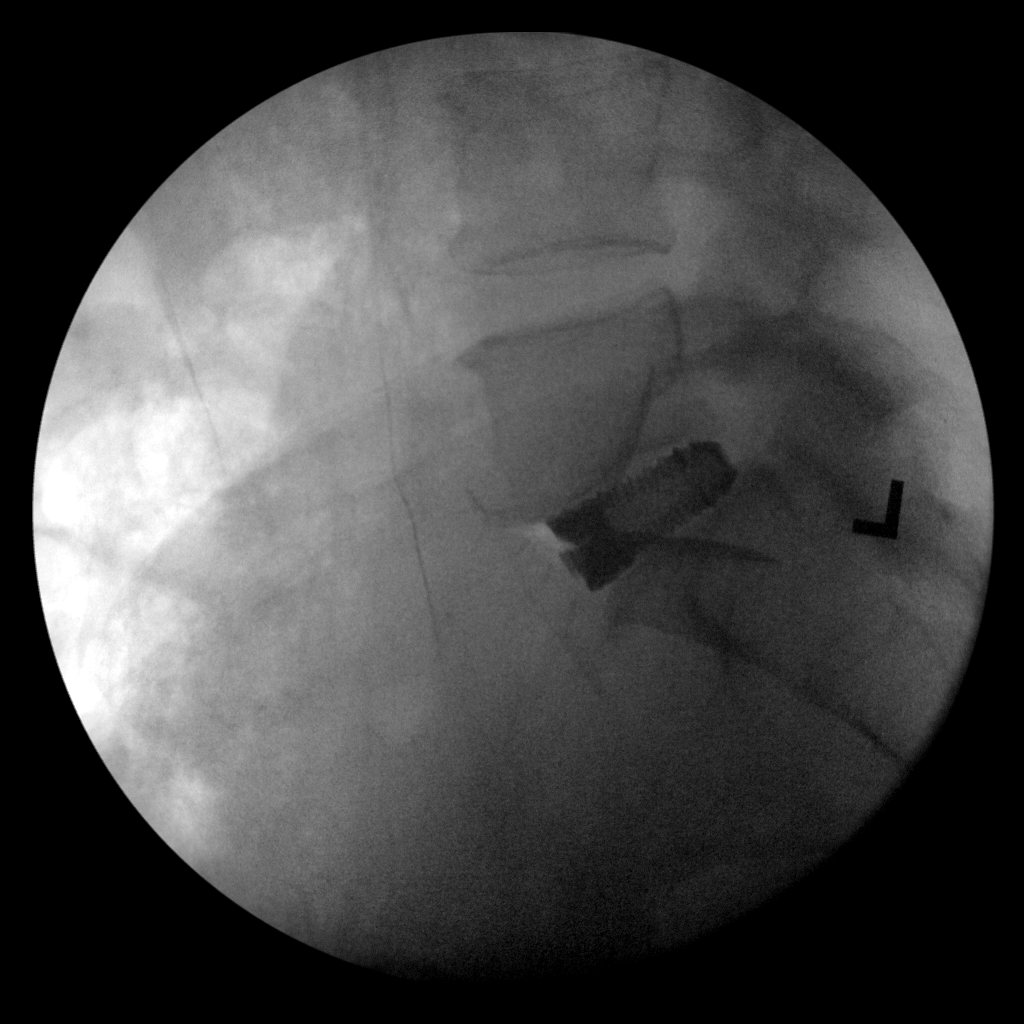
[im 2/2]
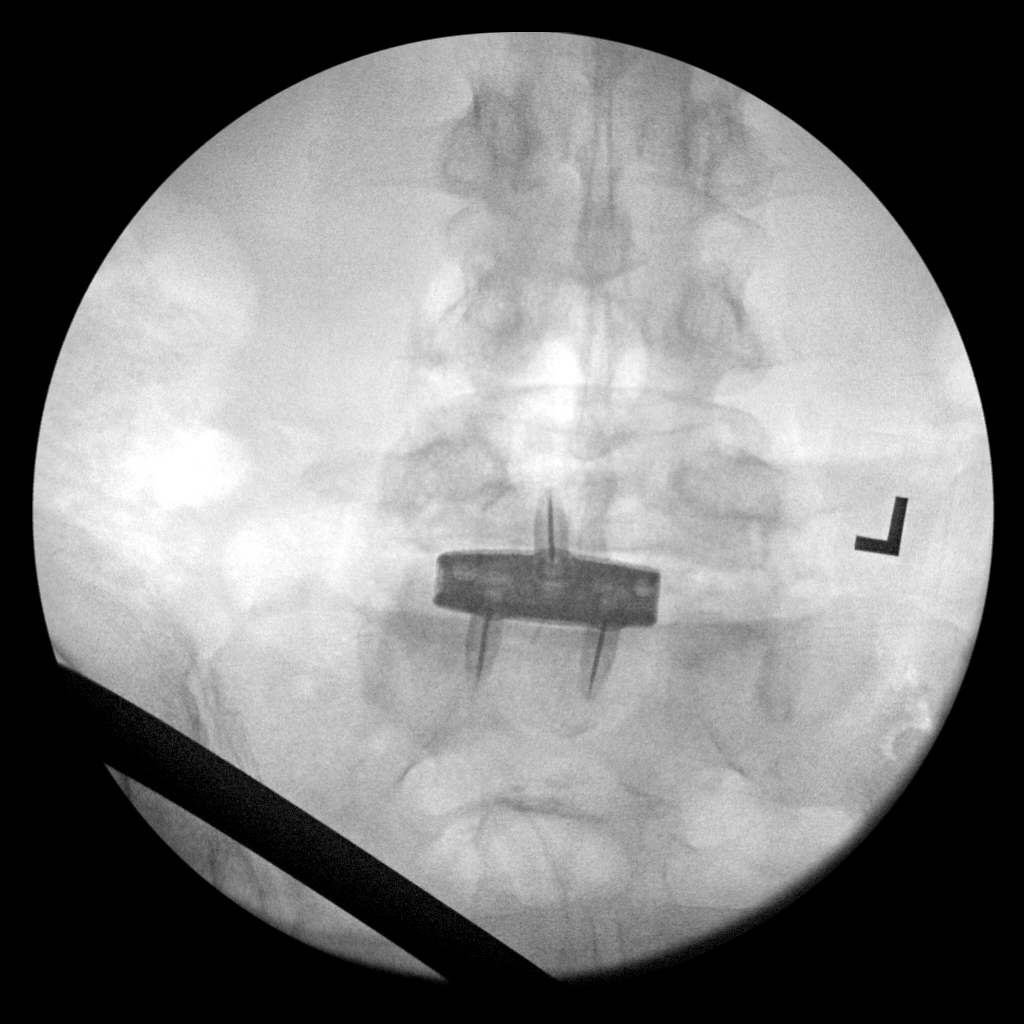

[2 of 2 positions shown; findings below may reference images not displayed]

FINDINGS: Normal lumbar segmentation on the comparison. Two intraoperative
fluoroscopic spot views of the lumbosacral junction in the AP and
lateral projection demonstrate anterior interbody implant placement
at L5-S1. No adverse hardware features.
IMPRESSION: Anterior approach L5-S1 interbody fusion depicted.

## 2022-06-17 NOTE — Progress Notes (Signed)
Office Visit    Patient Name: [name] Date of Encounter: 06/18/2022  PCP:  Johny Blamer, MD   [facility]  Cardiologist:  Verne Carrow, MD  Advanced Practice Provider:  No care team member to display Electrophysiologist:  None   HPI    [name] is a 51 y.o. female with a past medical history significant for anxiety, fibromyalgia, GERD, arthritis, HLD, rheumatoid arthritis, tobacco abuse presents today for overdue follow-up appointment.  She was seen initially March 2021.  She had palpitations at home.  She felt like her heart was skipping a beat.  She also had dyspnea on exertion.  No chest pain.  She was very anxious.  She smokes 1 pack a day.  She had a normal exercise stress test March 2021.  Echo March 2021 with LVEF 50 to 55%.  No valve disease.  Cardiac monitor April 2021 with sinus rhythm, 2 short runs of VT (longest 9 beats), 3 short runs of SVT (longest 12 beats).  She is here for overdue follow-up.  She was last seen April 2021 and at that time she denied any CV symptoms.  She does have some palpitations and was having severe back pain at that time.  Today, she tells me that she had an episode of a racing heartbeat and felt so bad that she immediately reported it to a fire station.  There, they did an EKG and told her she was in atrial fibrillation and advised that she go to the ED for further evaluation.  She has had other episodes of this racing heartbeat.  She also sometimes endorses a feeling of her heart stopping and her waiting for it to restart.  She was taken off her Cardizem but she cannot remember why.  She also states that her blood pressure has been elevated especially her diastolic blood pressure.  Reports 170/100 and 150/100.  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  She does endorse insignificant bleeding from the stones.  This has  been going on for 5 years per patient report.  Reports no chest pain/pressure. No edema, orthopnea, PND.  Past Medical History    Past Medical History:  Diagnosis Date   Anxiety    Arthralgia    Attention-deficit hyperactivity disorder, predominantly inattentive type    Back pain    Fear of flying    Fibromyalgia    Gastroesophageal reflux disease    HPV in female    Joint pain    Localized edema    Neck pain    OA (osteoarthritis)    Papillomavirus as the cause of diseases classified elsewhere    Primary insomnia    Pure hypercholesterolemia    Rheumatoid arthritis, seronegative, hand, unspecified laterality (HCC)    patient feels it is back related, not a true arthritis   Spondylolisthesis of lumbosacral region    Tobacco dependence    Unspecified inflammatory spondylopathy, cervical region (HCC)    Vitamin B12 deficiency anemia    Voice hoarseness    Past Surgical History:  Procedure Laterality Date   ABDOMINAL EXPOSURE N/A 06/28/2020   Procedure: ABDOMINAL EXPOSURE;  Surgeon: Cephus Shelling, MD;  Location: [facility] OR;  Service: Vascular;  Laterality: N/A;  anterior   ANTERIOR LUMBAR FUSION N/A 06/28/2020   Procedure: ANTERIOR LUMBAR INTERBODY FUSION LUMBAR FIVE-SACRAL ONE.;  Surgeon: Tia Alert, MD;  Location: [facility]  OR;  Service: Neurosurgery;  Laterality: N/A;  anterior approach   BREAST ENHANCEMENT SURGERY     CARPAL TUNNEL RELEASE Left    CARPAL TUNNEL RELEASE Right 06/28/2020   Procedure: CARPAL TUNNEL RELEASE;  Surgeon: Tia Alert, MD;  Location: [facility] OR;  Service: Neurosurgery;  Laterality: Right;   CERVICAL SPINE SURGERY     CHOLECYSTECTOMY     FOOT SURGERY     MANDIBLE SURGERY     TUBAL LIGATION     with ablation   TYMPANOSTOMY TUBE PLACEMENT      Allergies  Allergies  Allergen Reactions   Zoloft [Sertraline]     Altered mental state     EKGs/Labs/Other Studies Reviewed:   The following studies were reviewed today:  Echocardiogram  09/28/19 IMPRESSIONS     1. Left ventricular ejection fraction, by estimation, is 50 to 55%. The  left ventricle has low normal function. The left ventricle has no regional  wall motion abnormalities. The left ventricular internal cavity size was  mildly dilated. Left ventricular  diastolic parameters are indeterminate. The average left ventricular  global longitudinal strain is -17.6 %.   2. Right ventricular systolic function is normal. The right ventricular  size is normal. There is normal pulmonary artery systolic pressure.   3. The mitral valve is normal in structure. Trivial mitral valve  regurgitation. No evidence of mitral stenosis.   4. The aortic valve is tricuspid. Aortic valve regurgitation is not  visualized. No aortic stenosis is present.   5. The inferior vena cava is normal in size with greater than 50%  respiratory variability, suggesting right atrial pressure of 3 mmHg.   FINDINGS   Left Ventricle: Left ventricular ejection fraction, by estimation, is 50  to 55%. The left ventricle has low normal function. The left ventricle has  no regional wall motion abnormalities. The average left ventricular global  longitudinal strain is -17.6  %. The left ventricular internal cavity size was mildly dilated. There is  no left ventricular hypertrophy. Left ventricular diastolic parameters are  indeterminate.   Right Ventricle: The right ventricular size is normal. No increase in  right ventricular wall thickness. Right ventricular systolic function is  normal. There is normal pulmonary artery systolic pressure. The tricuspid  regurgitant velocity is 2.03 m/s, and   with an assumed right atrial pressure of 3 mmHg, the estimated right  ventricular systolic pressure is 19.5 mmHg.   Left Atrium: Left atrial size was normal in size.   Right Atrium: Right atrial size was normal in size.   Pericardium: There is no evidence of pericardial effusion.   Mitral Valve: The mitral  valve is normal in structure. Normal mobility of  the mitral valve leaflets. Trivial mitral valve regurgitation. No evidence  of mitral valve stenosis.   Tricuspid Valve: The tricuspid valve is normal in structure. Tricuspid  valve regurgitation is trivial. No evidence of tricuspid stenosis.   Aortic Valve: The aortic valve is tricuspid. Aortic valve regurgitation is  not visualized. No aortic stenosis is present.   Pulmonic Valve: The pulmonic valve was normal in structure. Pulmonic valve  regurgitation is not visualized. No evidence of pulmonic stenosis.   Aorta: The aortic root is normal in size and structure.   Venous: The inferior vena cava is normal in size with greater than 50%  respiratory variability, suggesting right atrial pressure of 3 mmHg.   IAS/Shunts: No atrial level shunt detected by color flow Doppler.   Cardiac monitor 10/01/19  Sinus  rhythm 2 episodes of ventricular tachycardia with longest at 9 beats.  Rare PVCs 3 short runs of supraventricular tachycardia, longest 12 beats  EKG:  EKG is  ordered today.  The ekg ordered today demonstrates NSR rate 95 bpm  Recent Labs: No results found for requested labs within last 365 days.  Recent Lipid Panel No results found for: "CHOL", "TRIG", "HDL", "CHOLHDL", "VLDL", "LDLCALC", "LDLDIRECT"  Risk Assessment/Calculations:   CHA2DS2-VASc Score = 2   This indicates a 2.2% annual risk of stroke. The patient's score is based upon: CHF History: 0 HTN History: 1 Diabetes History: 0 Stroke History: 0 Vascular Disease History: 0 Age Score: 0 Gender Score: 1      Home Medications   Current Meds  Medication Sig   albuterol (VENTOLIN HFA) 108 (90 Base) MCG/ACT inhaler Inhale 2 puffs into the lungs every 4 (four) hours as needed for wheezing or shortness of breath.   amphetamine-dextroamphetamine (ADDERALL XR) 30 MG 24 hr capsule Take 60 mg by mouth daily.   apixaban (ELIQUIS) 5 MG TABS tablet Take 1 tablet (5 mg  total) by mouth 2 (two) times daily.   aspirin-acetaminophen-caffeine (EXCEDRIN MIGRAINE) 250-250-65 MG tablet Take 2 tablets by mouth every 6 (six) hours as needed for headache.   benzonatate (TESSALON) 100 MG capsule Take 100-200 mg by mouth as needed.   cholecalciferol (VITAMIN D3) 25 MCG (1000 UT) tablet Take 1,000 Units by mouth daily.   [employer] VITAMIN B12 1000 MCG tablet Take 1,000 mcg by mouth daily.   escitalopram (LEXAPRO) 10 MG tablet Take 10 mg by mouth daily as needed (anxiety).   gabapentin (NEURONTIN) 100 MG capsule Take 100-600 mg by mouth 4 (four) times daily as needed (pain).   gabapentin (NEURONTIN) 300 MG capsule Take 600 mg by mouth at bedtime.   lisdexamfetamine (VYVANSE) 60 MG capsule TAKE 1 CAPSULE BY MOUTH EVERY DAY IN THE MORNING FOR 90 DAYS   omeprazole (PRILOSEC) 20 MG capsule Take 20 mg by mouth daily.   podofilox (CONDYLOX) 0.5 % external solution Apply 1 application topically once a week.   zolpidem (AMBIEN) 10 MG tablet Take 10 mg by mouth at bedtime.   [DISCONTINUED] diltiazem (CARDIZEM CD) 120 MG 24 hr capsule Take 1 capsule (120 mg total) by mouth daily.   [DISCONTINUED] diphenhydrAMINE (BENADRYL) 25 MG tablet Take 25 mg by mouth every 6 (six) hours as needed for allergies.   [DISCONTINUED] methocarbamol (ROBAXIN) 500 MG tablet Take 1 tablet (500 mg total) by mouth 4 (four) times daily.   [DISCONTINUED] oxyCODONE (ROXICODONE) 5 MG immediate release tablet 1 tablet po q 8 hrs prn severe pain     Review of Systems      All other systems reviewed and are otherwise negative except as noted above.  Physical Exam    VS:  BP (!) 130/94   Pulse 95   Ht 5' 2.75" (1.594 m)   Wt 176 lb 12.8 oz (80.2 kg)   SpO2 96%   BMI 31.57 kg/m  , BMI Body mass index is 31.57 kg/m.  Wt Readings from Last 3 Encounters:  06/18/22 176 lb 12.8 oz (80.2 kg)  06/28/20 155 lb (70.3 kg)  06/25/20 156 lb (70.8 kg)     GEN: Well nourished, well developed, in no acute  distress. HEENT: normal. Neck: Supple, no JVD, carotid bruits, or masses. Cardiac: RRR, no murmurs, rubs, or gallops. No clubbing, cyanosis, edema.  Radials/PT 2+ and equal bilaterally.  Respiratory:  Respirations regular and unlabored, clear  to auscultation bilaterally. GI: Soft, nontender, nondistended. MS: No deformity or atrophy. Skin: Warm and dry, no rash. Neuro:  Strength and sensation are intact. Psych: Normal affect.  Assessment & Plan    Palpitations/SVT/PVCs -sometimes her heart feels like it stops and then starts again -zio monitor for further workup for 14 days -echocardiogram -limit caffeine and alcohol -smoking cessation discussed -labs: CBC, BMP, TSH, and mag  Documented afib -she had an episode of racing HR and stopped at a fire station. They did an EKG and she was told she was in Afib and to report to the ED immediately. She states it lasted about 45 minutes -She tells me she has had other episodes like this -start Eliquis 5mg  BID -CHA2DS2-VASc of 2 with 2.2% risk of stroke -start back on Cardizem CD 120mg   3. SOB/racing heart while eating -will workup from a cardiac standpoint -also would recommend f/u with PCP    Disposition: Follow up 4 weeks with , MD or APP.  Signed, , PA-C 06/18/2022, 9:57 AM [facility]

## 2022-06-18 ENCOUNTER — Encounter: Payer: Self-pay | Admitting: Physician Assistant

## 2022-06-18 ENCOUNTER — Ambulatory Visit: Payer: BC Managed Care – PPO | Attending: Physician Assistant

## 2022-06-18 ENCOUNTER — Ambulatory Visit
Admission: RE | Admit: 2022-06-18 | Discharge: 2022-06-18 | Disposition: A | Discharge status: Home / Self Care | Payer: BC Managed Care – PPO | Source: Ambulatory Visit | Attending: Physician Assistant | Admitting: Physician Assistant

## 2022-06-18 ENCOUNTER — Other Ambulatory Visit: Payer: Self-pay | Admitting: Physician Assistant

## 2022-06-18 ENCOUNTER — Ambulatory Visit: Payer: BC Managed Care – PPO | Attending: Physician Assistant | Admitting: Physician Assistant

## 2022-06-18 VITALS — BP 130/94 | HR 95 | Ht 62.75 in | Wt 176.8 lb

## 2022-06-18 DIAGNOSIS — R059 Cough, unspecified: Secondary | ICD-10-CM

## 2022-06-18 DIAGNOSIS — I493 Ventricular premature depolarization: Secondary | ICD-10-CM | POA: Diagnosis not present

## 2022-06-18 DIAGNOSIS — I471 Supraventricular tachycardia, unspecified: Secondary | ICD-10-CM

## 2022-06-18 DIAGNOSIS — R002 Palpitations: Secondary | ICD-10-CM

## 2022-06-18 MED ORDER — DILTIAZEM HCL ER COATED BEADS 120 MG PO CP24: 120.0000 mg | ORAL_CAPSULE | Freq: Every day | ORAL | 3 refills | Status: DC

## 2022-06-18 MED ORDER — APIXABAN 5 MG PO TABS: 5.0000 mg | ORAL_TABLET | Freq: Two times a day (BID) | ORAL | 3 refills | Status: DC

## 2022-06-18 NOTE — Patient Instructions (Signed)
Medication Instructions:  1.Start Eliquis 5 mg twice a day 2.Resume Cardizem CD 120 mg daily *If you need a refill on your cardiac medications before your next appointment, please call your pharmacy*   Lab Work: CBC, BMP, TSH and MBT:DHRCB If you have labs (blood work) drawn today and your tests are completely normal, you will receive your results only by: MyChart Message (if you have MyChart) OR A paper copy in the mail If you have any lab test that is abnormal or we need to change your treatment, we will call you to review the results.   Testing/Procedures: Your physician has requested that you have an echocardiogram. Echocardiography is a painless test that uses sound waves to create images of your heart. It provides your doctor with information about the size and shape of your heart and how well your heart's chambers and valves are working. This procedure takes approximately one hour. There are no restrictions for this procedure. Please do NOT wear cologne, perfume, aftershave, or lotions (deodorant is allowed). Please arrive 15 minutes prior to your appointment time.   ZIO XT- Long Term Monitor Instructions  Your physician has requested you wear a ZIO patch monitor for 14 days.  This is a single patch monitor. [employer] supplies one patch monitor per enrollment. Additional stickers are not available. Please do not apply patch if you will be having a Nuclear Stress Test,  Echocardiogram, Cardiac CT, MRI, or Chest Xray during the period you would be wearing the  monitor. The patch cannot be worn during these tests. You cannot remove and re-apply the  ZIO XT patch monitor.  Your ZIO patch monitor will be mailed 3 day USPS to your address on file. It may take 3-5 days  to receive your monitor after you have been enrolled.  Once you have received your monitor, please review the enclosed instructions. Your monitor  has already been registered assigning a specific monitor serial # to  you.  Billing and Patient Assistance Program Information  We have supplied [employer] with any of your insurance information on file for billing purposes. [employer] offers a sliding scale Patient Assistance Program for patients that do not have  insurance, or whose insurance does not completely cover the cost of the ZIO monitor.  You must apply for the Patient Assistance Program to qualify for this discounted rate.  To apply, please call [employer] at [phone], select option 4, select option 2, ask to apply for  Patient Assistance Program. Meredeth Ide will ask your household income, and how many people  are in your household. They will quote your out-of-pocket cost based on that information.  [employer] will also be able to set up a 89-month, interest-free payment plan if needed.  Applying the monitor   Shave hair from upper left chest.  Hold abrader disc by orange tab. Rub abrader in 40 strokes over the upper left chest as  indicated in your monitor instructions.  Clean area with 4 enclosed alcohol pads. Let dry.  Apply patch as indicated in monitor instructions. Patch will be placed under collarbone on left  side of chest with arrow pointing upward.  Rub patch adhesive wings for 2 minutes. Remove white label marked "1". Remove the white  label marked "2". Rub patch adhesive wings for 2 additional minutes.  While looking in a mirror, press and release button in center of patch. A small green light will  flash 3-4 times. This will be your only indicator that the monitor has been turned on.  Do not shower for the first 24 hours. You may shower after the first 24 hours.  Press the button if you feel a symptom. You will hear a small click. Record Date, Time and  Symptom in the Patient Logbook.  When you are ready to remove the patch, follow instructions on the last 2 pages of Patient  Logbook. Stick patch monitor onto the last page of Patient Logbook.  Place Patient Logbook in the blue and white box.  Use locking tab on box and tape box closed  securely. The blue and white box has prepaid postage on it. Please place it in the mailbox as  soon as possible. Your physician should have your test results approximately 7 days after the  monitor has been mailed back to [facility].  Call [facility] Customer Care at [phone] if you have questions regarding  your ZIO XT patch monitor. Call them immediately if you see an orange light blinking on your  monitor.  If your monitor falls off in less than 4 days, contact our Monitor department at [phone].  If your monitor becomes loose or falls off after 4 days call [employer] at [phone] for  suggestions on securing your monitor   Follow-Up: At [facility], you and your health needs are our priority.  As part of our continuing mission to provide you with exceptional heart care, we have created designated Provider Care Teams.  These Care Teams include your primary Cardiologist (physician) and Advanced Practice Providers (APPs -  Physician Assistants and Nurse Practitioners) who all work together to provide you with the care you need, when you need it.  Your next appointment:   1 month(s)  The format for your next appointment:   In Person  Provider:   Verne Carrow, MD  or APP  Other Instructions Check your blood pressure daily, one hour after taking your morning medications and keep a log to bring to your next office visit.  Important Information About Sugar

## 2022-06-18 NOTE — Progress Notes (Unsigned)
Enrolled patient for a 14 day Zio XT monitor to be mailed to patients home   Dr McAlhany to read 

## 2022-06-19 LAB — CBC
Hematocrit: 45.2 % (ref 34.0–46.6)
Hemoglobin: 14.9 g/dL (ref 11.1–15.9)
MCH: 30 pg (ref 26.6–33.0)
MCHC: 33 g/dL (ref 31.5–35.7)
MCV: 91 fL (ref 79–97)
Platelets: 353 10*3/uL (ref 150–450)
RBC: 4.96 x10E6/uL (ref 3.77–5.28)
RDW: 12.7 % (ref 11.7–15.4)
WBC: 7.2 10*3/uL (ref 3.4–10.8)

## 2022-06-19 LAB — MAGNESIUM: Magnesium: 2.2 mg/dL (ref 1.6–2.3)

## 2022-06-19 LAB — BASIC METABOLIC PANEL
BUN/Creatinine Ratio: 14 (ref 9–23)
BUN: 10 mg/dL (ref 6–24)
CO2: 24 mmol/L (ref 20–29)
Calcium: 9.7 mg/dL (ref 8.7–10.2)
Chloride: 99 mmol/L (ref 96–106)
Creatinine, Ser: 0.71 mg/dL (ref 0.57–1.00)
Glucose: 105 mg/dL — ABNORMAL HIGH (ref 70–99)
Potassium: 4.8 mmol/L (ref 3.5–5.2)
Sodium: 139 mmol/L (ref 134–144)
eGFR: 102 mL/min/{1.73_m2} (ref >59)

## 2022-06-19 LAB — TSH: TSH: 0.652 u[IU]/mL (ref 0.450–4.500)

## 2022-06-23 ENCOUNTER — Telehealth: Payer: Self-pay | Admitting: Physician Assistant

## 2022-06-23 NOTE — Telephone Encounter (Addendum)
Pt called to report that she has been feeling bad today... she says she feels like she is in Afib... she has been having a lot of anxiety.. her most recent BP today was 156/108... her HR 100.... she says she just dos not feel well... I asked her to go ahead and call EMS and they can place her on a monitor... she agrees and will call them now.   She has taken her Diltiazem today and may tyr an extra 1/2 if EMS feels it is needed after their assessment. She has been taking her Eliquis.

## 2022-06-23 NOTE — Telephone Encounter (Signed)
Patient c/o Palpitations:  High priority if patient c/o lightheadedness, shortness of breath, or chest pain  How long have you had palpitations/irregular HR/ Afib? Are you having the symptoms now? She is in afib    Are you currently experiencing lightheadedness, SOB or CP? A little chest pain  Do you have a history of afib (atrial fibrillation) or irregular heart rhythm?   Have you checked your BP or HR? (document readings if available):jumping from  152 to 180, bottom number 90 to 111  Are you experiencing any other symptoms? - patient saidi she had chest xray on 06-19-22- please look at the results in My Chart- patient said she would  nitro called inn Epic- She said her glucose is high too

## 2022-07-20 ENCOUNTER — Ambulatory Visit (HOSPITAL_COMMUNITY): Payer: BC Managed Care – PPO | Attending: Physician Assistant

## 2022-07-20 DIAGNOSIS — R002 Palpitations: Secondary | ICD-10-CM

## 2022-07-20 DIAGNOSIS — I340 Nonrheumatic mitral (valve) insufficiency: Secondary | ICD-10-CM

## 2022-07-20 LAB — ECHOCARDIOGRAM COMPLETE
Area-P 1/2: 6.65 cm2
S' Lateral: 4.4 cm

## 2022-07-23 NOTE — Progress Notes (Signed)
Office Visit    Patient Name: [name] Date of Encounter: 07/24/2022  PCP:  [name], [facility]  Cardiologist:  Lauree Chandler, MD  Advanced Practice Provider:  No care team member to display Electrophysiologist:  None   HPI    [name] is a 52 y.o. female with a past medical history significant for anxiety, fibromyalgia, GERD, arthritis, HLD, rheumatoid arthritis, tobacco abuse presents today for overdue follow-up appointment.  She was seen initially March 2021.  She had palpitations at home.  She felt like her heart was skipping a beat.  She also had dyspnea on exertion.  No chest pain.  She was very anxious.  She smokes 1 pack a day.  She had a normal exercise stress test March 2021.  Echo March 2021 with LVEF 50 to 55%.  No valve disease.  Cardiac monitor April 2021 with sinus rhythm, 2 short runs of VT (longest 9 beats), 3 short runs of SVT (longest 12 beats).  She is here for overdue follow-up.  She was last seen April 2021 and at that time she denied any CV symptoms.  She does have some palpitations and was having severe back pain at that time.  She was last seen 06/18/22 by myself and she told me that she had an episode of a racing heartbeat and felt so bad that she immediately reported it to a fire station.  There, they did an EKG and told her she was in atrial fibrillation and advised that she go to the ED for further evaluation.  She has had other episodes of this racing heartbeat.  She also sometimes endorses a feeling of her heart stopping and her waiting for it to restart.  She was taken off her Cardizem but she cannot remember why.  She also states that her blood pressure has been elevated especially her diastolic blood pressure.  Reports 170/100 and 150/100.  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  She does endorse insignificant  bleeding from the stones.  This has been going on for 5 years per patient report.  She had a significant finding on her echocardiogram which revealed a reduced EF and some wall regional motion abnormality.  A coronary CTA scan will be arranged today.  She still is having some SOB. She has felt horrible from a URI/possible bronchitis. She went to her PCP's office and they did not want to start her on anything due to her Afib and the workup that we are doing. We discussed next steps will be determined after the CT scan. She can place the zio (which she already has) after her coronary CT scan. Her BP is elevated today here in the clinic and has been at home per the patient. She thought it was due to the Eliquis however, I explained that Eliquis should not affect BP. We discussed increasing her Cardizem today.  Reports no chest pain, pressure, or tightness. No edema, orthopnea, PND.   Past Medical History    Past Medical History:  Diagnosis Date   Anxiety    Arthralgia    Attention-deficit hyperactivity disorder, predominantly inattentive type    Back pain    Fear of flying    Fibromyalgia    Gastroesophageal reflux disease    HPV in female    Joint pain    Localized edema    Neck  pain    OA (osteoarthritis)    Papillomavirus as the cause of diseases classified elsewhere    Primary insomnia    Pure hypercholesterolemia    Rheumatoid arthritis, seronegative, hand, unspecified laterality (HCC)    patient feels it is back related, not a true arthritis   Spondylolisthesis of lumbosacral region    Tobacco dependence    Unspecified inflammatory spondylopathy, cervical region (HCC)    Vitamin B12 deficiency anemia    Voice hoarseness    Past Surgical History:  Procedure Laterality Date   ABDOMINAL EXPOSURE N/A 06/28/2020   Procedure: ABDOMINAL EXPOSURE;  Surgeon: Cephus Shelling, MD;  Location: [facility] OR;  Service: Vascular;  Laterality: N/A;  anterior   ANTERIOR LUMBAR FUSION N/A  06/28/2020   Procedure: ANTERIOR LUMBAR INTERBODY FUSION LUMBAR FIVE-SACRAL ONE.;  Surgeon: Tia Alert, MD;  Location: [facility] OR;  Service: Neurosurgery;  Laterality: N/A;  anterior approach   BREAST ENHANCEMENT SURGERY     CARPAL TUNNEL RELEASE Left    CARPAL TUNNEL RELEASE Right 06/28/2020   Procedure: CARPAL TUNNEL RELEASE;  Surgeon: Tia Alert, MD;  Location[facility] OR;  Service: Neurosurgery;  Laterality: Right;   CERVICAL SPINE SURGERY     CHOLECYSTECTOMY     FOOT SURGERY     MANDIBLE SURGERY     TUBAL LIGATION     with ablation   TYMPANOSTOMY TUBE PLACEMENT      Allergies  Allergies  Allergen Reactions   Zoloft [Sertraline]     Altered mental state     EKGs/Labs/Other Studies Reviewed:   The following studies were reviewed today:  Echocardiogram 07/20/2021  IMPRESSIONS     1. Left ventricular ejection fraction, by estimation, is 35 to 40%. The  left ventricle has moderately decreased function. The left ventricle  demonstrates regional wall motion abnormalities (see scoring  diagram/findings for description). The left  ventricular internal cavity size was mildly dilated. Left ventricular  diastolic parameters are indeterminate. Mild global hypokinesis with  moderate hypokinesis of septal wall.   2. Right ventricular systolic function is normal. The right ventricular  size is normal.   3. Left atrial size was moderately dilated.   4. The mitral valve is normal in structure. Mild to moderate mitral valve  regurgitation. No evidence of mitral stenosis.   5. The aortic valve is grossly normal. There is mild calcification of the  aortic valve. Aortic valve regurgitation is not visualized. Aortic valve  sclerosis is present, with no evidence of aortic valve stenosis.   6. The inferior vena cava is normal in size with greater than 50%  respiratory variability, suggesting right atrial pressure of 3 mmHg.   7. Cannot exclude a small PFO.   Comparison(s): Prior images  reviewed side by side. Changes from prior  study are noted. Prior images with normal EF, maybe minimal abnormal  septal motion. EF reduced and septal motion worse compared to prior.   Conclusion(s)/Recommendation(s): Significantly changed compared to prior,  will contact Jari Favre and Dr. Clifton James.   FINDINGS   Left Ventricle: Left ventricular ejection fraction, by estimation, is 35  to 40%. The left ventricle has moderately decreased function. The left  ventricle demonstrates regional wall motion abnormalities. Moderate  hypokinesis of the left ventricular,  entire anteroseptal wall and inferoseptal wall. Global longitudinal strain  performed but not reported based on interpreter judgement due to  suboptimal tracking. 3D left ventricular ejection fraction analysis  performed but not reported based on  interpreter judgement due to  suboptimal tracking. The left ventricular  internal cavity size was mildly dilated. There is no left ventricular  hypertrophy. Left ventricular diastolic parameters are indeterminate.     LV Wall Scoring:  Mild global hypokinesis with moderate hypokinesis of septal wall.   Right Ventricle: The right ventricular size is normal. No increase in  right ventricular wall thickness. Right ventricular systolic function is  normal.   Left Atrium: Left atrial size was moderately dilated.   Right Atrium: Right atrial size was normal in size.   Pericardium: There is no evidence of pericardial effusion.   Mitral Valve: The mitral valve is normal in structure. There is mild  thickening of the mitral valve leaflet(s). Mild to moderate mitral valve  regurgitation. No evidence of mitral valve stenosis.   Tricuspid Valve: The tricuspid valve is normal in structure. Tricuspid  valve regurgitation is trivial. No evidence of tricuspid stenosis.   Aortic Valve: The aortic valve is grossly normal. There is mild  calcification of the aortic valve. Aortic valve  regurgitation is not  visualized. Aortic valve sclerosis is present, with no evidence of aortic  valve stenosis.   Pulmonic Valve: The pulmonic valve was grossly normal. Pulmonic valve  regurgitation is trivial. No evidence of pulmonic stenosis.   Aorta: The aortic root, ascending aorta, aortic arch and descending aorta  are all structurally normal, with no evidence of dilitation or  obstruction.   Venous: The inferior vena cava is normal in size with greater than 50%  respiratory variability, suggesting right atrial pressure of 3 mmHg.   IAS/Shunts: Cannot exclude a small PFO.    Echocardiogram 09/28/19 IMPRESSIONS     1. Left ventricular ejection fraction, by estimation, is 50 to 55%. The  left ventricle has low normal function. The left ventricle has no regional  wall motion abnormalities. The left ventricular internal cavity size was  mildly dilated. Left ventricular  diastolic parameters are indeterminate. The average left ventricular  global longitudinal strain is -17.6 %.   2. Right ventricular systolic function is normal. The right ventricular  size is normal. There is normal pulmonary artery systolic pressure.   3. The mitral valve is normal in structure. Trivial mitral valve  regurgitation. No evidence of mitral stenosis.   4. The aortic valve is tricuspid. Aortic valve regurgitation is not  visualized. No aortic stenosis is present.   5. The inferior vena cava is normal in size with greater than 50%  respiratory variability, suggesting right atrial pressure of 3 mmHg.   FINDINGS   Left Ventricle: Left ventricular ejection fraction, by estimation, is 50  to 55%. The left ventricle has low normal function. The left ventricle has  no regional wall motion abnormalities. The average left ventricular global  longitudinal strain is -17.6  %. The left ventricular internal cavity size was mildly dilated. There is  no left ventricular hypertrophy. Left ventricular diastolic  parameters are  indeterminate.   Right Ventricle: The right ventricular size is normal. No increase in  right ventricular wall thickness. Right ventricular systolic function is  normal. There is normal pulmonary artery systolic pressure. The tricuspid  regurgitant velocity is 2.03 m/s, and   with an assumed right atrial pressure of 3 mmHg, the estimated right  ventricular systolic pressure is 123456 mmHg.   Left Atrium: Left atrial size was normal in size.   Right Atrium: Right atrial size was normal in size.   Pericardium: There is no evidence of pericardial effusion.   Mitral Valve: The mitral  valve is normal in structure. Normal mobility of  the mitral valve leaflets. Trivial mitral valve regurgitation. No evidence  of mitral valve stenosis.   Tricuspid Valve: The tricuspid valve is normal in structure. Tricuspid  valve regurgitation is trivial. No evidence of tricuspid stenosis.   Aortic Valve: The aortic valve is tricuspid. Aortic valve regurgitation is  not visualized. No aortic stenosis is present.   Pulmonic Valve: The pulmonic valve was normal in structure. Pulmonic valve  regurgitation is not visualized. No evidence of pulmonic stenosis.   Aorta: The aortic root is normal in size and structure.   Venous: The inferior vena cava is normal in size with greater than 50%  respiratory variability, suggesting right atrial pressure of 3 mmHg.   IAS/Shunts: No atrial level shunt detected by color flow Doppler.   Cardiac monitor 10/01/19  Sinus rhythm 2 episodes of ventricular tachycardia with longest at 9 beats.  Rare PVCs 3 short runs of supraventricular tachycardia, longest 12 beats  EKG:  EKG is  ordered today.  The ekg ordered today demonstrates NSR rate 95 bpm  Recent Labs: 06/18/2022: BUN 10; Creatinine, Ser 0.71; Hemoglobin 14.9; Magnesium 2.2; Platelets 353; Potassium 4.8; Sodium 139; TSH 0.652  Recent Lipid Panel No results found for: "CHOL", "TRIG", "HDL",  "CHOLHDL", "VLDL", "LDLCALC", "LDLDIRECT"  Risk Assessment/Calculations:   CHA2DS2-VASc Score = 2   This indicates a 2.2% annual risk of stroke. The patient's score is based upon: CHF History: 0 HTN History: 1 Diabetes History: 0 Stroke History: 0 Vascular Disease History: 0 Age Score: 0 Gender Score: 1      Home Medications   Current Meds  Medication Sig   albuterol (VENTOLIN HFA) 108 (90 Base) MCG/ACT inhaler Inhale 2 puffs into the lungs every 4 (four) hours as needed for wheezing or shortness of breath.   amphetamine-dextroamphetamine (ADDERALL XR) 30 MG 24 hr capsule Take 60 mg by mouth daily.   apixaban (ELIQUIS) 5 MG TABS tablet Take 1 tablet (5 mg total) by mouth 2 (two) times daily.   aspirin-acetaminophen-caffeine (EXCEDRIN MIGRAINE) 250-250-65 MG tablet Take 2 tablets by mouth every 6 (six) hours as needed for headache.   benzonatate (TESSALON) 100 MG capsule Take 100-200 mg by mouth as needed.   cholecalciferol (VITAMIN D3) 25 MCG (1000 UT) tablet Take 1,000 Units by mouth daily.   [employer] VITAMIN B12 1000 MCG tablet Take 1,000 mcg by mouth daily.   diltiazem (CARDIZEM CD) 240 MG 24 hr capsule Take 1 capsule (240 mg total) by mouth daily.   escitalopram (LEXAPRO) 10 MG tablet Take 10 mg by mouth daily as needed (anxiety).   gabapentin (NEURONTIN) 100 MG capsule Take 100-600 mg by mouth 4 (four) times daily as needed (pain).   gabapentin (NEURONTIN) 300 MG capsule Take 600 mg by mouth at bedtime.   lisdexamfetamine (VYVANSE) 60 MG capsule TAKE 1 CAPSULE BY MOUTH EVERY DAY IN THE MORNING FOR 90 DAYS   metoprolol tartrate (LOPRESSOR) 100 MG tablet Take 1 tablet by mouth 2 hours prior to CT   nitroGLYCERIN (NITROSTAT) 0.4 MG SL tablet Place 1 tablet (0.4 mg total) under the tongue every 5 (five) minutes as needed for chest pain.   omeprazole (PRILOSEC) 20 MG capsule Take 20 mg by mouth daily.   podofilox (CONDYLOX) 0.5 % external solution Apply 1 application topically once a  week.   zolpidem (AMBIEN) 10 MG tablet Take 10 mg by mouth at bedtime.   [DISCONTINUED] diltiazem (CARDIZEM CD) 120 MG 24 hr capsule  Take 1 capsule (120 mg total) by mouth daily.     Review of Systems      All other systems reviewed and are otherwise negative except as noted above.  Physical Exam    VS:  BP (!) 158/101   Pulse 96   Ht 5' 2.5" (1.588 m)   Wt 181 lb (82.1 kg)   SpO2 96%   BMI 32.58 kg/m  , BMI Body mass index is 32.58 kg/m.  Wt Readings from Last 3 Encounters:  07/24/22 181 lb (82.1 kg)  06/18/22 176 lb 12.8 oz (80.2 kg)  06/28/20 155 lb (70.3 kg)     GEN: Well nourished, well developed, in no acute distress. HEENT: normal. Neck: Supple, no JVD, carotid bruits, or masses. Cardiac: RRR, no murmurs, rubs, or gallops. No clubbing, cyanosis, edema.  Radials/PT 2+ and equal bilaterally.  Respiratory:  Respirations regular and unlabored, clear to auscultation bilaterally. GI: Soft, nontender, nondistended. MS: No deformity or atrophy. Skin: Warm and dry, no rash. Neuro:  Strength and sensation are intact. Psych: Normal affect.  Assessment & Plan    Palpitations/SVT/PVCs -zio monitor for further workup for 14 days -limit caffeine and alcohol -smoking cessation discussed -labs: CBC and BMP  Documented afib -she had an episode of racing HR and stopped at a fire station. They did an EKG and she was told she was in Afib and to report to the ED immediately. She states it lasted about 45 minutes -She tells me she has had other episodes like this -start Eliquis 5mg  BID -CHA2DS2-VASc of 2 with 2.2% risk of stroke -increase to  Cardizem CD 240mg   3. SOB/racing heart while eating -will workup from a cardiac standpoint -also would recommend f/u with PCP -CT of the neck? (She has a surgical site that she is worried about and has been discussing this with Dr. Ronnald Ramp)  4. Hypertension -increased Cardizem today -encouraged to keep track of her BP at home   5.  Bronchitis? -d/w PCP and steroid taper + z-pak ordered  -OTC mucinex is safe to use -recommend f/u with PCP    Disposition: Follow up 4 weeks with Lauree Chandler, MD or APP.  Signed, Elgie Collard, PA-C 07/24/2022, 4:54 PM [facility]

## 2022-07-24 ENCOUNTER — Ambulatory Visit: Payer: BC Managed Care – PPO | Attending: Physician Assistant | Admitting: Physician Assistant

## 2022-07-24 ENCOUNTER — Ambulatory Visit (INDEPENDENT_AMBULATORY_CARE_PROVIDER_SITE_OTHER): Payer: BC Managed Care – PPO

## 2022-07-24 ENCOUNTER — Encounter: Payer: Self-pay | Admitting: Physician Assistant

## 2022-07-24 VITALS — BP 158/101 | HR 96 | Ht 62.5 in | Wt 181.0 lb

## 2022-07-24 DIAGNOSIS — I493 Ventricular premature depolarization: Secondary | ICD-10-CM | POA: Diagnosis not present

## 2022-07-24 DIAGNOSIS — R0602 Shortness of breath: Secondary | ICD-10-CM

## 2022-07-24 DIAGNOSIS — I255 Ischemic cardiomyopathy: Secondary | ICD-10-CM

## 2022-07-24 DIAGNOSIS — I4891 Unspecified atrial fibrillation: Secondary | ICD-10-CM

## 2022-07-24 DIAGNOSIS — R002 Palpitations: Secondary | ICD-10-CM

## 2022-07-24 DIAGNOSIS — I471 Supraventricular tachycardia, unspecified: Secondary | ICD-10-CM

## 2022-07-24 MED ORDER — NITROGLYCERIN 0.4 MG SL SUBL: 0.4000 mg | SUBLINGUAL_TABLET | SUBLINGUAL | 3 refills | Status: DC | PRN

## 2022-07-24 MED ORDER — METOPROLOL TARTRATE 100 MG PO TABS: ORAL_TABLET | ORAL | 0 refills | Status: DC

## 2022-07-24 MED ORDER — DILTIAZEM HCL ER COATED BEADS 240 MG PO CP24: 240.0000 mg | ORAL_CAPSULE | Freq: Every day | ORAL | 3 refills | Status: DC

## 2022-07-24 NOTE — Progress Notes (Unsigned)
Enrolled patient for a 14 day Zio XT monitor to be mailed to patients home  [place] to read

## 2022-07-24 NOTE — Patient Instructions (Addendum)
Medication Instructions:  1.Increase diltiazem (Cardizem CD) to 240 mg daily 2.We have sent in sublingual nitroglycerin 0.4 mg, one tablet under the tongue as needed for chest pain, max of 3 doses. If no relief call 911 *If you need a refill on your cardiac medications before your next appointment, please call your pharmacy*   Lab Work: BMET and CBC today If you have labs (blood work) drawn today and your tests are completely normal, you will receive your results only by: [place] (if you have MyChart) OR A paper copy in the mail If you have any lab test that is abnormal or we need to change your treatment, we will call you to review the results.   Testing/Procedures:   Your cardiac CT will be scheduled at one of the below locations:   [facility] [address] [place], [place] [address] [phone] [place] [address] [place], [place] [address] [phone]  [facility] [place], [place] [address] [phone]  If scheduled at [facility], please arrive at the [facility] (Entrance C2) of [facility] 30 minutes prior to test start time. You can use the FREE valet parking offered at entrance C (encouraged to control the heart rate for the test)  Proceed to the [facility] Radiology Department (first floor) to check-in and test prep.  All radiology patients and guests should use entrance C2 at [facility], accessed from [facility], even though the hospital's physical address listed is [address].    If scheduled at [facility] or [facility], please arrive 15 mins early for check-in and test prep.   Please follow these instructions carefully (unless otherwise directed):  Hold all erectile dysfunction medications at least 3 days (72  hrs) prior to test. (Ie viagra, cialis, sildenafil, tadalafil, etc) We will administer nitroglycerin during this exam.   On the Night Before the Test: Be sure to Drink plenty of water. Do not consume any caffeinated/decaffeinated beverages or chocolate 12 hours prior to your test. Do not take any antihistamines 12 hours prior to your test. If the patient has contrast allergy: Patient will need a prescription for Prednisone and very clear instructions (as follows): Prednisone 50 mg - take 13 hours prior to test Take another Prednisone 50 mg 7 hours prior to test Take another Prednisone 50 mg 1 hour prior to test Take Benadryl 50 mg 1 hour prior to test Patient must complete all four doses of above prophylactic medications. Patient will need a ride after test due to Benadryl.  On the Day of the Test: Drink plenty of water until 1 hour prior to the test. Do not eat any food 1 hour prior to test. You may take your regular medications prior to the test.  Take metoprolol (Lopressor) 100 mg two hours prior to test. HOLD Furosemide/Hydrochlorothiazide morning of the test. FEMALES- please wear underwire-free bra if available, avoid dresses & tight clothing       After the Test: Drink plenty of water. After receiving IV contrast, you may experience a mild flushed feeling. This is normal. On occasion, you may experience a mild rash up to 24 hours after the test. This is not dangerous. If this occurs, you can take Benadryl 25 mg and increase your fluid intake. If you experience trouble breathing, this can be serious. If  it is severe call 911 IMMEDIATELY. If it is mild, please call our office. If you take any of these medications: Glipizide/Metformin, Avandament, Glucavance, please do not take 48 hours after completing test unless otherwise instructed.  We will call to schedule your test 2-4 weeks out understanding that some insurance companies will need an authorization prior to the service being  performed.   For non-scheduling related questions, please contact the cardiac imaging nurse navigator should you have any questions/concerns: Rockwell Alexandria, Cardiac Imaging Nurse Navigator Larey Brick, Cardiac Imaging Nurse Navigator [facility] Direct Office Dial: [phone]   For scheduling needs, including cancellations and rescheduling, please call [place], [phone].   ZIO XT- Long Term Monitor Instructions  Your physician has requested you wear a ZIO patch monitor for 14 days.  This is a single patch monitor. [employer] supplies one patch monitor per enrollment. Additional stickers are not available. Please do not apply patch if you will be having a Nuclear Stress Test,  Echocardiogram, Cardiac CT, MRI, or Chest Xray during the period you would be wearing the  monitor. The patch cannot be worn during these tests. You cannot remove and re-apply the  ZIO XT patch monitor.  Your ZIO patch monitor will be mailed 3 day USPS to your address on file. It may take 3-5 days  to receive your monitor after you have been enrolled.  Once you have received your monitor, please review the enclosed instructions. Your monitor  has already been registered assigning a specific monitor serial # to you.  Billing and Patient Assistance Program Information  We have supplied [employer] with any of your insurance information on file for billing purposes. [employer] offers a sliding scale Patient Assistance Program for patients that do not have  insurance, or whose insurance does not completely cover the cost of the ZIO monitor.  You must apply for the Patient Assistance Program to qualify for this discounted rate.  To apply, please call [employer] at [phone], select option 4, select option 2, ask to apply for  Patient Assistance Program. Meredeth Ide will ask your household income, and how many people  are in your household. They will quote your out-of-pocket cost based on that  information.  [employer] will also be able to set up a 26-month, interest-free payment plan if needed.  Applying the monitor   Shave hair from upper left chest.  Hold abrader disc by orange tab. Rub abrader in 40 strokes over the upper left chest as  indicated in your monitor instructions.  Clean area with 4 enclosed alcohol pads. Let dry.  Apply patch as indicated in monitor instructions. Patch will be placed under collarbone on left  side of chest with arrow pointing upward.  Rub patch adhesive wings for 2 minutes. Remove white label marked "1". Remove the white  label marked "2". Rub patch adhesive wings for 2 additional minutes.  While looking in a mirror, press and release button in center of patch. A small green light will  flash 3-4 times. This will be your only indicator that the monitor has been turned on.  Do not shower for the first 24 hours. You may shower after the first 24 hours.  Press the button if you feel a symptom. You will hear a small click. Record Date, Time and  Symptom in the Patient Logbook.  When you are ready to remove the patch, follow instructions on the last 2 pages of Patient  Logbook. Stick patch monitor onto the last page  of Patient Logbook.  Place Patient Logbook in the blue and white box. Use locking tab on box and tape box closed  securely. The blue and white box has prepaid postage on it. Please place it in the mailbox as  soon as possible. Your physician should have your test results approximately 7 days after the  monitor has been mailed back to [facility].  Call [place] at [phone] if you have questions regarding  your ZIO XT patch monitor. Call them immediately if you see an orange light blinking on your  monitor.  If your monitor falls off in less than 4 days, contact our Monitor department at [phone].  If your monitor becomes loose or falls off after 4 days call [employer] at [phone] for  suggestions on  securing your monitor    Follow-Up: At [facility], you and your health needs are our priority.  As part of our continuing mission to provide you with exceptional heart care, we have created designated Provider Care Teams.  These Care Teams include your primary Cardiologist (physician) and Advanced Practice Providers (APPs -  Physician Assistants and Nurse Practitioners) who all work together to provide you with the care you need, when you need it.   Your next appointment:   4-6 weeks (appt should be after Coronary CTA)  Provider:   Lauree Chandler, MD  or Nicholes Rough, PA-C      Other Instructions Check your blood pressure daily, one hour after taking you morning medications for 2 weeks, keep a log and send [place] the readings through mychart.

## 2022-07-25 LAB — CBC
Hematocrit: 43.6 % (ref 34.0–46.6)
Hemoglobin: 14.8 g/dL (ref 11.1–15.9)
MCH: 30.8 pg (ref 26.6–33.0)
MCHC: 33.9 g/dL (ref 31.5–35.7)
MCV: 91 fL (ref 79–97)
Platelets: 358 10*3/uL (ref 150–450)
RBC: 4.81 x10E6/uL (ref 3.77–5.28)
RDW: 13.6 % (ref 11.7–15.4)
WBC: 9.6 10*3/uL (ref 3.4–10.8)

## 2022-07-25 LAB — BASIC METABOLIC PANEL
BUN/Creatinine Ratio: 13 (ref 9–23)
BUN: 8 mg/dL (ref 6–24)
CO2: 26 mmol/L (ref 20–29)
Calcium: 9.5 mg/dL (ref 8.7–10.2)
Chloride: 101 mmol/L (ref 96–106)
Creatinine, Ser: 0.6 mg/dL (ref 0.57–1.00)
Glucose: 97 mg/dL (ref 70–99)
Potassium: 4.1 mmol/L (ref 3.5–5.2)
Sodium: 142 mmol/L (ref 134–144)
eGFR: 108 mL/min/{1.73_m2} (ref >59)

## 2022-07-28 ENCOUNTER — Telehealth: Payer: Self-pay | Admitting: Physician Assistant

## 2022-07-28 NOTE — Telephone Encounter (Signed)
Message received in [place] Triage, Pt called back.   Pt stated she is having a Cardiac CT this Friday, but was asking about a follow up appointment with Ms. Nicholes Rough, PA-C this Friday afternoon?  Pt states she may need to have a future surgery?  Per our telephone conversation, it sounded like a [facility] Team member had already started to address this, but I am unsure...   I will message Ms. Harriet Pho, PA-C this question, and follow up accordingly.

## 2022-07-28 NOTE — Telephone Encounter (Signed)
I did not need this encounter. °

## 2022-07-29 ENCOUNTER — Telehealth (HOSPITAL_COMMUNITY): Payer: Self-pay | Admitting: Emergency Medicine

## 2022-07-29 NOTE — Telephone Encounter (Signed)
Reaching out to patient to offer assistance regarding upcoming cardiac imaging study; pt verbalizes understanding of appt date/time, parking situation and where to check in, pre-test NPO status and medications ordered, and verified current allergies; name and call back number provided for further questions should they arise Janet Bond RN Navigator Cardiac Imaging [name] [phone] office [phone] cell  Arrival 0830 WC entrance Denies iv issues 100mg  metoprolol tartrate  Holding stimulants, caffeine

## 2022-07-31 ENCOUNTER — Ambulatory Visit (HOSPITAL_COMMUNITY)
Admission: RE | Admit: 2022-07-31 | Discharge: 2022-07-31 | Disposition: A | Discharge status: Home / Self Care | Payer: BC Managed Care – PPO | Source: Ambulatory Visit | Attending: Physician Assistant | Admitting: Physician Assistant

## 2022-07-31 ENCOUNTER — Other Ambulatory Visit: Payer: Self-pay | Admitting: Internal Medicine

## 2022-07-31 DIAGNOSIS — R931 Abnormal findings on diagnostic imaging of heart and coronary circulation: Secondary | ICD-10-CM | POA: Insufficient documentation

## 2022-07-31 DIAGNOSIS — I255 Ischemic cardiomyopathy: Secondary | ICD-10-CM | POA: Diagnosis present

## 2022-07-31 MED ORDER — NITROGLYCERIN 0.4 MG SL SUBL
0.8000 mg | SUBLINGUAL_TABLET | Freq: Once | SUBLINGUAL | Status: AC
  Administered 2022-07-31: 0.8 mg via SUBLINGUAL

## 2022-07-31 MED ORDER — IOHEXOL 350 MG/ML SOLN
100.0000 mL | Freq: Once | INTRAVENOUS | Status: AC | PRN
  Administered 2022-07-31: 100 mL via INTRAVENOUS

## 2022-07-31 MED ORDER — NITROGLYCERIN 0.4 MG SL SUBL
SUBLINGUAL_TABLET | SUBLINGUAL | Status: AC
  Filled 2022-07-31: qty 2

## 2022-07-31 NOTE — Progress Notes (Signed)
Coronary CT sent for FFR. 

## 2022-08-01 ENCOUNTER — Ambulatory Visit (HOSPITAL_BASED_OUTPATIENT_CLINIC_OR_DEPARTMENT_OTHER)
Admission: RE | Admit: 2022-08-01 | Discharge: 2022-08-01 | Disposition: A | Discharge status: Home / Self Care | Payer: BC Managed Care – PPO | Source: Ambulatory Visit | Attending: Internal Medicine | Admitting: Internal Medicine

## 2022-08-01 DIAGNOSIS — R931 Abnormal findings on diagnostic imaging of heart and coronary circulation: Secondary | ICD-10-CM | POA: Diagnosis not present

## 2022-08-02 NOTE — Progress Notes (Signed)
Office Visit    Patient Name: [name] Date of Encounter: 08/02/2022  PCP:  [name], [facility]  Cardiologist:  Lauree Chandler, MD  Advanced Practice Provider:  No care team member to display Electrophysiologist:  None   HPI    [name] is a 52 y.o. female with a past medical history significant for anxiety, fibromyalgia, GERD, arthritis, HLD, rheumatoid arthritis, tobacco abuse presents today for overdue follow-up appointment.  She was seen initially March 2021.  She had palpitations at home.  She felt like her heart was skipping a beat.  She also had dyspnea on exertion.  No chest pain.  She was very anxious.  She smokes 1 pack a day.  She had a normal exercise stress test March 2021.  Echo March 2021 with LVEF 50 to 55%.  No valve disease.  Cardiac monitor April 2021 with sinus rhythm, 2 short runs of VT (longest 9 beats), 3 short runs of SVT (longest 12 beats).  She is here for overdue follow-up.  She was last seen April 2021 and at that time she denied any CV symptoms.  She does have some palpitations and was having severe back pain at that time.  She was last seen 06/18/22 by myself and she told me that she had an episode of a racing heartbeat and felt so bad that she immediately reported it to a fire station.  There, they did an EKG and told her she was in atrial fibrillation and advised that she go to the ED for further evaluation.  She has had other episodes of this racing heartbeat.  She also sometimes endorses a feeling of her heart stopping and her waiting for it to restart.  She was taken off her Cardizem but she cannot remember why.  She also states that her blood pressure has been elevated especially her diastolic blood pressure.  Reports 170/100 and 150/100.  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  She does endorse insignificant  bleeding from the stones.  This has been going on for 5 years per patient report.  She was seen by me 07/24/21, She had a significant finding on her echocardiogram which revealed a reduced EF and some wall regional motion abnormality.  A coronary CTA scan will be arranged today.  She still is having some SOB. She has felt horrible from a URI/possible bronchitis. She went to her PCP's office and they did not want to start her on anything due to her Afib and the workup that we are doing. We discussed next steps will be determined after the CT scan. She can place the zio (which she already has) after her coronary CT scan. Her BP is elevated today here in the clinic and has been at home per the patient. She thought it was due to the Eliquis however, I explained that Eliquis should not affect BP. We discussed increasing her Cardizem today.  Today, she states that she is under a lot of stress and her husband makes it worse.  She states that stress from taking care of him has started all of these health problems for her.  She is worried that she will drop dead.  She did take some magnesium and potassium and it relieved the pressure in her chest.  She is also on aspirin 325 mg.  She manages an HOA and is  very stressful.  She has got her husband off financially and emotionally because he is pulling her down from a health standpoint.  She is still having chest pains and a lot of shortness of breath.  Her shortness of breath is not proportional to any of the cardiac findings as far.  We reviewed her CT scan as well as her echocardiogram again.  She states that she just is running out of oxygen.  No swelling.  Weight has been stable.  She also is short of breath when eating and bending over.  She was given a large dose of metoprolol for the CT scan and it did not touch her heart rate.  She then needed to take a few nitroglycerin tabs and that lowered her heart rate.  No edema, orthopnea, PND. Reports no palpitations.     Past Medical History    Past Medical History:  Diagnosis Date   Anxiety    Arthralgia    Attention-deficit hyperactivity disorder, predominantly inattentive type    Back pain    Fear of flying    Fibromyalgia    Gastroesophageal reflux disease    HPV in female    Joint pain    Localized edema    Neck pain    OA (osteoarthritis)    Papillomavirus as the cause of diseases classified elsewhere    Primary insomnia    Pure hypercholesterolemia    Rheumatoid arthritis, seronegative, hand, unspecified laterality (HCC)    patient feels it is back related, not a true arthritis   Spondylolisthesis of lumbosacral region    Tobacco dependence    Unspecified inflammatory spondylopathy, cervical region (HCC)    Vitamin B12 deficiency anemia    Voice hoarseness    Past Surgical History:  Procedure Laterality Date   ABDOMINAL EXPOSURE N/A 06/28/2020   Procedure: ABDOMINAL EXPOSURE;  Surgeon: Cephus Shelling, MD;  Location: [facility] OR;  Service: Vascular;  Laterality: N/A;  anterior   ANTERIOR LUMBAR FUSION N/A 06/28/2020   Procedure: ANTERIOR LUMBAR INTERBODY FUSION LUMBAR FIVE-SACRAL ONE.;  Surgeon: Tia Alert, MD;  Location: [facility] OR;  Service: Neurosurgery;  Laterality: N/A;  anterior approach   BREAST ENHANCEMENT SURGERY     CARPAL TUNNEL RELEASE Left    CARPAL TUNNEL RELEASE Right 06/28/2020   Procedure: CARPAL TUNNEL RELEASE;  Surgeon: Tia Alert, MD;  Location: [facility] OR;  Service: Neurosurgery;  Laterality: Right;   CERVICAL SPINE SURGERY     CHOLECYSTECTOMY     FOOT SURGERY     MANDIBLE SURGERY     TUBAL LIGATION     with ablation   TYMPANOSTOMY TUBE PLACEMENT      Allergies  Allergies  Allergen Reactions   Zoloft [Sertraline]     Altered mental state     EKGs/Labs/Other Studies Reviewed:   The following studies were reviewed today:  Echocardiogram 07/20/2021  IMPRESSIONS     1. Left ventricular ejection fraction, by estimation, is 35 to 40%. The  left  ventricle has moderately decreased function. The left ventricle  demonstrates regional wall motion abnormalities (see scoring  diagram/findings for description). The left  ventricular internal cavity size was mildly dilated. Left ventricular  diastolic parameters are indeterminate. Mild global hypokinesis with  moderate hypokinesis of septal wall.   2. Right ventricular systolic function is normal. The right ventricular  size is normal.   3. Left atrial size was moderately dilated.   4. The mitral valve is normal in structure. Mild to moderate mitral valve  regurgitation. No evidence of mitral stenosis.   5. The aortic valve is grossly normal. There is mild calcification of the  aortic valve. Aortic valve regurgitation is not visualized. Aortic valve  sclerosis is present, with no evidence of aortic valve stenosis.   6. The inferior vena cava is normal in size with greater than 50%  respiratory variability, suggesting right atrial pressure of 3 mmHg.   7. Cannot exclude a small PFO.   Comparison(s): Prior images reviewed side by side. Changes from prior  study are noted. Prior images with normal EF, maybe minimal abnormal  septal motion. EF reduced and septal motion worse compared to prior.   Conclusion(s)/Recommendation(s): Significantly changed compared to prior,  will contact Nicholes Rough and Dr. Angelena Form.   FINDINGS   Left Ventricle: Left ventricular ejection fraction, by estimation, is 35  to 40%. The left ventricle has moderately decreased function. The left  ventricle demonstrates regional wall motion abnormalities. Moderate  hypokinesis of the left ventricular,  entire anteroseptal wall and inferoseptal wall. Global longitudinal strain  performed but not reported based on interpreter judgement due to  suboptimal tracking. 3D left ventricular ejection fraction analysis  performed but not reported based on  interpreter judgement due to suboptimal tracking. The left ventricular   internal cavity size was mildly dilated. There is no left ventricular  hypertrophy. Left ventricular diastolic parameters are indeterminate.     LV Wall Scoring:  Mild global hypokinesis with moderate hypokinesis of septal wall.   Right Ventricle: The right ventricular size is normal. No increase in  right ventricular wall thickness. Right ventricular systolic function is  normal.   Left Atrium: Left atrial size was moderately dilated.   Right Atrium: Right atrial size was normal in size.   Pericardium: There is no evidence of pericardial effusion.   Mitral Valve: The mitral valve is normal in structure. There is mild  thickening of the mitral valve leaflet(s). Mild to moderate mitral valve  regurgitation. No evidence of mitral valve stenosis.   Tricuspid Valve: The tricuspid valve is normal in structure. Tricuspid  valve regurgitation is trivial. No evidence of tricuspid stenosis.   Aortic Valve: The aortic valve is grossly normal. There is mild  calcification of the aortic valve. Aortic valve regurgitation is not  visualized. Aortic valve sclerosis is present, with no evidence of aortic  valve stenosis.   Pulmonic Valve: The pulmonic valve was grossly normal. Pulmonic valve  regurgitation is trivial. No evidence of pulmonic stenosis.   Aorta: The aortic root, ascending aorta, aortic arch and descending aorta  are all structurally normal, with no evidence of dilitation or  obstruction.   Venous: The inferior vena cava is normal in size with greater than 50%  respiratory variability, suggesting right atrial pressure of 3 mmHg.   IAS/Shunts: Cannot exclude a small PFO.    Echocardiogram 09/28/19 IMPRESSIONS     1. Left ventricular ejection fraction, by estimation, is 50 to 55%. The  left ventricle has low normal function. The left ventricle has no regional  wall motion abnormalities. The left ventricular internal cavity size was  mildly dilated. Left ventricular   diastolic parameters are indeterminate. The average left ventricular  global longitudinal strain is -17.6 %.   2. Right ventricular systolic function is normal. The right ventricular  size is normal. There is normal pulmonary artery systolic pressure.   3. The mitral valve is normal in structure. Trivial mitral valve  regurgitation. No evidence of mitral stenosis.   4. The  aortic valve is tricuspid. Aortic valve regurgitation is not  visualized. No aortic stenosis is present.   5. The inferior vena cava is normal in size with greater than 50%  respiratory variability, suggesting right atrial pressure of 3 mmHg.   FINDINGS   Left Ventricle: Left ventricular ejection fraction, by estimation, is 50  to 55%. The left ventricle has low normal function. The left ventricle has  no regional wall motion abnormalities. The average left ventricular global  longitudinal strain is -17.6  %. The left ventricular internal cavity size was mildly dilated. There is  no left ventricular hypertrophy. Left ventricular diastolic parameters are  indeterminate.   Right Ventricle: The right ventricular size is normal. No increase in  right ventricular wall thickness. Right ventricular systolic function is  normal. There is normal pulmonary artery systolic pressure. The tricuspid  regurgitant velocity is 2.03 m/s, and   with an assumed right atrial pressure of 3 mmHg, the estimated right  ventricular systolic pressure is 19.5 mmHg.   Left Atrium: Left atrial size was normal in size.   Right Atrium: Right atrial size was normal in size.   Pericardium: There is no evidence of pericardial effusion.   Mitral Valve: The mitral valve is normal in structure. Normal mobility of  the mitral valve leaflets. Trivial mitral valve regurgitation. No evidence  of mitral valve stenosis.   Tricuspid Valve: The tricuspid valve is normal in structure. Tricuspid  valve regurgitation is trivial. No evidence of tricuspid  stenosis.   Aortic Valve: The aortic valve is tricuspid. Aortic valve regurgitation is  not visualized. No aortic stenosis is present.   Pulmonic Valve: The pulmonic valve was normal in structure. Pulmonic valve  regurgitation is not visualized. No evidence of pulmonic stenosis.   Aorta: The aortic root is normal in size and structure.   Venous: The inferior vena cava is normal in size with greater than 50%  respiratory variability, suggesting right atrial pressure of 3 mmHg.   IAS/Shunts: No atrial level shunt detected by color flow Doppler.   Cardiac monitor 10/01/19  Sinus rhythm 2 episodes of ventricular tachycardia with longest at 9 beats.  Rare PVCs 3 short runs of supraventricular tachycardia, longest 12 beats  EKG:  EKG is  ordered today.  The ekg ordered today demonstrates NSR rate 95 bpm  Recent Labs: 06/18/2022: Magnesium 2.2; TSH 0.652 07/24/2022: BUN 8; Creatinine, Ser 0.60; Hemoglobin 14.8; Platelets 358; Potassium 4.1; Sodium 142  Recent Lipid Panel No results found for: "CHOL", "TRIG", "HDL", "CHOLHDL", "VLDL", "LDLCALC", "LDLDIRECT"  Risk Assessment/Calculations:   CHA2DS2-VASc Score = 2   This indicates a 2.2% annual risk of stroke. The patient's score is based upon: CHF History: 0 HTN History: 1 Diabetes History: 0 Stroke History: 0 Vascular Disease History: 0 Age Score: 0 Gender Score: 1      Home Medications   No outpatient medications have been marked as taking for the 08/03/22 encounter (Appointment) with Sharlene Dory, PA-C.     Review of Systems      All other systems reviewed and are otherwise negative except as noted above.  Physical Exam    VS:  There were no vitals taken for this visit. , BMI There is no height or weight on file to calculate BMI.  Wt Readings from Last 3 Encounters:  07/24/22 181 lb (82.1 kg)  06/18/22 176 lb 12.8 oz (80.2 kg)  06/28/20 155 lb (70.3 kg)     GEN: Well nourished, well developed, in  no acute  distress. HEENT: normal. Neck: Supple, no JVD, carotid bruits, or masses. Cardiac: RRR, no murmurs, rubs, or gallops. No clubbing, cyanosis, edema.  Radials/PT 2+ and equal bilaterally.  Respiratory:  Respirations regular and unlabored, RLL rhonchi. GI: Soft, nontender, nondistended. MS: No deformity or atrophy. Skin: Warm and dry, no rash. Neuro:  Strength and sensation are intact. Psych: Normal affect.  Assessment & Plan    Reduced EF and WMA /CAD/Chest pain -CTA performed 1/19  -FFR results for coronary CT - there appears to be a flow-limitation in a D2 branch. This would be out of proportion to be the cause of the patient's cardiomyopathy.  -Imdur 15mg  daily added  -I will discuss next steps with Dr.  Palpitations/SVT/PVCs -zio monitor for further workup for 14 days-she will place the patch today -limit caffeine and alcohol -smoking cessation discussed -labs: CBC and BMP  Documented afib -she had an episode of racing HR and stopped at a fire station. They did an EKG and she was told she was in Afib and to report to the ED immediately. She states it lasted about 45 minutes -She tells me she has had other episodes like this -start Eliquis 5mg  BID -CHA2DS2-VASc of 2 with 2.2% risk of stroke -increase to  Cardizem CD 240mg   3. SOB/racing heart while eating -will workup from a cardiac standpoint -also would recommend f/u with PCP -CT of the neck? (She has a surgical site that she is worried about and has been discussing this with Dr. Clifton James) She now is having swelling of her neck at times  4. Hypertension -increased Cardizem, BP better today -encouraged to keep track of her BP at home   5. Bronchitis? -d/w PCP and steroid taper + z-pak ordered  -OTC mucinex is safe to use -recommend f/u with PCP    Disposition: Follow up 3-4 weeks with , MD or APP.  Signed, , PA-C 08/02/2022, 7:09 PM [facility]

## 2022-08-03 ENCOUNTER — Encounter: Payer: Self-pay | Admitting: Physician Assistant

## 2022-08-03 ENCOUNTER — Ambulatory Visit: Payer: BC Managed Care – PPO | Attending: Physician Assistant | Admitting: Physician Assistant

## 2022-08-03 VITALS — BP 122/60 | HR 102 | Ht 62.5 in | Wt 177.4 lb

## 2022-08-03 DIAGNOSIS — I493 Ventricular premature depolarization: Secondary | ICD-10-CM

## 2022-08-03 DIAGNOSIS — I4891 Unspecified atrial fibrillation: Secondary | ICD-10-CM | POA: Diagnosis not present

## 2022-08-03 DIAGNOSIS — R002 Palpitations: Secondary | ICD-10-CM

## 2022-08-03 DIAGNOSIS — I471 Supraventricular tachycardia, unspecified: Secondary | ICD-10-CM

## 2022-08-03 DIAGNOSIS — R931 Abnormal findings on diagnostic imaging of heart and coronary circulation: Secondary | ICD-10-CM

## 2022-08-03 DIAGNOSIS — I255 Ischemic cardiomyopathy: Secondary | ICD-10-CM | POA: Diagnosis not present

## 2022-08-03 DIAGNOSIS — R0602 Shortness of breath: Secondary | ICD-10-CM | POA: Diagnosis not present

## 2022-08-03 MED ORDER — ISOSORBIDE MONONITRATE ER 30 MG PO TB24: 15.0000 mg | ORAL_TABLET | Freq: Every day | ORAL | 3 refills | Status: DC

## 2022-08-03 NOTE — Patient Instructions (Signed)
Medication Instructions:  Start Imdur 15 mg daily, this will be 1/2 of a 30 mg tablet daily *If you need a refill on your cardiac medications before your next appointment, please call your pharmacy*   Lab Work: None If you have labs (blood work) drawn today and your tests are completely normal, you will receive your results only by: [place] (if you have MyChart) OR A paper copy in the mail If you have any lab test that is abnormal or we need to change your treatment, we will call you to review the results.   Follow-Up: At [facility], you and your health needs are our priority.  As part of our continuing mission to provide you with exceptional heart care, we have created designated Provider Care Teams.  These Care Teams include your primary Cardiologist (physician) and Advanced Practice Providers (APPs -  Physician Assistants and Nurse Practitioners) who all work together to provide you with the care you need, when you need it.   Your next appointment:   3-4 week(s) with an APP Next available with Dr Angelena Form Call and schedule an appt with primary care  Other Instructions Be sure to wear your monitor

## 2022-08-19 NOTE — Progress Notes (Signed)
I have already routed this back to [facility] to make her aware that she will need to discuss risks and benefits of cath with patient. I do see that the patient has an appointment scheduled to see Christen Bame, NP coming up on 08/27/22 at 8:25 AM.

## 2022-08-27 ENCOUNTER — Ambulatory Visit: Payer: BC Managed Care – PPO | Admitting: Nurse Practitioner

## 2022-08-31 ENCOUNTER — Telehealth: Payer: Self-pay | Admitting: Cardiovascular Disease

## 2022-08-31 NOTE — H&P (View-Only) (Signed)
Office Visit    Patient Name: [name] Date of Encounter: 09/01/2022  PCP:  [name], [facility]  Cardiologist:  Lauree Chandler, MD  Advanced Practice Provider:  No care team member to display Electrophysiologist:  None   HPI    [name] is a 52 y.o. female with a past medical history significant for anxiety, fibromyalgia, GERD, arthritis, HLD, rheumatoid arthritis, tobacco abuse presents today for overdue follow-up appointment.  She was seen initially March 2021.  She had palpitations at home.  She felt like her heart was skipping a beat.  She also had dyspnea on exertion.  No chest pain.  She was very anxious.  She smokes 1 pack a day.  She had a normal exercise stress test March 2021.  Echo March 2021 with LVEF 50 to 55%.  No valve disease.  Cardiac monitor April 2021 with sinus rhythm, 2 short runs of VT (longest 9 beats), 3 short runs of SVT (longest 12 beats).  She is here for overdue follow-up.  She was last seen April 2021 and at that time she denied any CV symptoms.  She does have some palpitations and was having severe back pain at that time.  She was last seen 06/18/22 by myself and she told me that she had an episode of a racing heartbeat and felt so bad that she immediately reported it to a fire station.  There, they did an EKG and told her she was in atrial fibrillation and advised that she go to the ED for further evaluation.  She has had other episodes of this racing heartbeat.  She also sometimes endorses a feeling of her heart stopping and her waiting for it to restart.  She was taken off her Cardizem but she cannot remember why.  She also states that her blood pressure has been elevated especially her diastolic blood pressure.  Reports 170/100 and 150/100.  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  She does endorse insignificant  bleeding from the stones.  This has been going on for 5 years per patient report.  She was seen by me 07/24/21, She had a significant finding on her echocardiogram which revealed a reduced EF and some wall regional motion abnormality.  A coronary CTA scan will be arranged today.  She still is having some SOB. She has felt horrible from a URI/possible bronchitis. She went to her PCP's office and they did not want to start her on anything due to her Afib and the workup that we are doing. We discussed next steps will be determined after the CT scan. She can place the zio (which she already has) after her coronary CT scan. Her BP is elevated today here in the clinic and has been at home per the patient. She thought it was due to the Eliquis however, I explained that Eliquis should not affect BP. We discussed increasing her Cardizem today.  I saw her back in January 2024 and she states that she is under a lot of stress and her husband makes it worse.  She states that stress from taking care of him has started all of these health problems for her.  She is worried that she will drop dead.  She did take some magnesium and potassium and it relieved the pressure in her chest.  She is also on aspirin 325 mg.  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  She has got her husband off financially and emotionally because he is pulling her down from a health standpoint.  She is still having chest pains and a lot of shortness of breath.  Her shortness of breath is not proportional to any of the cardiac findings as far.  We reviewed her CT scan as well as her echocardiogram again.  She states that she just is running out of oxygen.  No swelling.  Weight has been stable.  She also is short of breath when eating and bending over.  She was given a large dose of metoprolol for the CT scan and it did not touch her heart rate.  She then needed to take a few nitroglycerin tabs and that lowered her heart rate.  Today, she shares with me  that he is very fatigued. She is limiting her activity and gets out of breath easily. Scared to get on a treadmill or do any physical activity.  We have had issues receiving her ZIO monitor x 2. Once in December we never got results and now her monitor from January. She tells me she dropped off at [employer].  There has been some miscommunication.  I have asked her to contact [employer] to see what happened.  We also discussed cardiac catheterization to determine what is causing her symptoms.  Her heart rate remains elevated today.  She remains on Cardizem for blood pressure and heart rate control.  We have started her on daily Lipitor and we will obtain an updated lipid panel.  Reports no shortness of breath nor dyspnea on exertion. Reports no chest pain, pressure, or tightness. No edema, orthopnea, PND. Reports no palpitations.   Past Medical History    Past Medical History:  Diagnosis Date   Anxiety    Arthralgia    Attention-deficit hyperactivity disorder, predominantly inattentive type    Back pain    Fear of flying    Fibromyalgia    Gastroesophageal reflux disease    HPV in female    Joint pain    Localized edema    Neck pain    OA (osteoarthritis)    Papillomavirus as the cause of diseases classified elsewhere    Primary insomnia    Pure hypercholesterolemia    Rheumatoid arthritis, seronegative, hand, unspecified laterality ([place])    patient feels it is back related, not a true arthritis   Spondylolisthesis of lumbosacral region    Tobacco dependence    Unspecified inflammatory spondylopathy, cervical region ([place])    Vitamin B12 deficiency anemia    Voice hoarseness    Past Surgical History:  Procedure Laterality Date   ABDOMINAL EXPOSURE N/A 06/28/2020   Procedure: ABDOMINAL EXPOSURE;  Surgeon: Marty Heck, MD;  Location: [facility];  Service: Vascular;  Laterality: N/A;  anterior   ANTERIOR LUMBAR FUSION N/A 06/28/2020   Procedure: ANTERIOR LUMBAR INTERBODY FUSION LUMBAR  FIVE-SACRAL ONE.;  Surgeon: Eustace Moore, MD;  Location: [facility];  Service: Neurosurgery;  Laterality: N/A;  anterior approach   BREAST ENHANCEMENT SURGERY     CARPAL TUNNEL RELEASE Left    CARPAL TUNNEL RELEASE Right 06/28/2020   Procedure: CARPAL TUNNEL RELEASE;  Surgeon: Eustace Moore, MD;  Location: [facility];  Service: Neurosurgery;  Laterality: Right;   CERVICAL SPINE SURGERY     CHOLECYSTECTOMY     FOOT SURGERY     MANDIBLE SURGERY     TUBAL LIGATION     with ablation  TYMPANOSTOMY TUBE PLACEMENT      Allergies  Allergies  Allergen Reactions   Zoloft [Sertraline]     Altered mental state     EKGs/Labs/Other Studies Reviewed:   The following studies were reviewed today:  Echocardiogram 07/20/2021  IMPRESSIONS     1. Left ventricular ejection fraction, by estimation, is 35 to 40%. The  left ventricle has moderately decreased function. The left ventricle  demonstrates regional wall motion abnormalities (see scoring  diagram/findings for description). The left  ventricular internal cavity size was mildly dilated. Left ventricular  diastolic parameters are indeterminate. Mild global hypokinesis with  moderate hypokinesis of septal wall.   2. Right ventricular systolic function is normal. The right ventricular  size is normal.   3. Left atrial size was moderately dilated.   4. The mitral valve is normal in structure. Mild to moderate mitral valve  regurgitation. No evidence of mitral stenosis.   5. The aortic valve is grossly normal. There is mild calcification of the  aortic valve. Aortic valve regurgitation is not visualized. Aortic valve  sclerosis is present, with no evidence of aortic valve stenosis.   6. The inferior vena cava is normal in size with greater than 50%  respiratory variability, suggesting right atrial pressure of 3 mmHg.   7. Cannot exclude a small PFO.   Comparison(s): Prior images reviewed side by side. Changes from prior  study are noted. Prior  images with normal EF, maybe minimal abnormal  septal motion. EF reduced and septal motion worse compared to prior.   Conclusion(s)/Recommendation(s): Significantly changed compared to prior,  will contact Nicholes Rough and Dr. Angelena Form.   FINDINGS   Left Ventricle: Left ventricular ejection fraction, by estimation, is 35  to 40%. The left ventricle has moderately decreased function. The left  ventricle demonstrates regional wall motion abnormalities. Moderate  hypokinesis of the left ventricular,  entire anteroseptal wall and inferoseptal wall. Global longitudinal strain  performed but not reported based on interpreter judgement due to  suboptimal tracking. 3D left ventricular ejection fraction analysis  performed but not reported based on  interpreter judgement due to suboptimal tracking. The left ventricular  internal cavity size was mildly dilated. There is no left ventricular  hypertrophy. Left ventricular diastolic parameters are indeterminate.     LV Wall Scoring:  Mild global hypokinesis with moderate hypokinesis of septal wall.   Right Ventricle: The right ventricular size is normal. No increase in  right ventricular wall thickness. Right ventricular systolic function is  normal.   Left Atrium: Left atrial size was moderately dilated.   Right Atrium: Right atrial size was normal in size.   Pericardium: There is no evidence of pericardial effusion.   Mitral Valve: The mitral valve is normal in structure. There is mild  thickening of the mitral valve leaflet(s). Mild to moderate mitral valve  regurgitation. No evidence of mitral valve stenosis.   Tricuspid Valve: The tricuspid valve is normal in structure. Tricuspid  valve regurgitation is trivial. No evidence of tricuspid stenosis.   Aortic Valve: The aortic valve is grossly normal. There is mild  calcification of the aortic valve. Aortic valve regurgitation is not  visualized. Aortic valve sclerosis is present, with  no evidence of aortic  valve stenosis.   Pulmonic Valve: The pulmonic valve was grossly normal. Pulmonic valve  regurgitation is trivial. No evidence of pulmonic stenosis.   Aorta: The aortic root, ascending aorta, aortic arch and descending aorta  are all structurally normal, with no evidence of  dilitation or  obstruction.   Venous: The inferior vena cava is normal in size with greater than 50%  respiratory variability, suggesting right atrial pressure of 3 mmHg.   IAS/Shunts: Cannot exclude a small PFO.    Echocardiogram 09/28/19 IMPRESSIONS     1. Left ventricular ejection fraction, by estimation, is 50 to 55%. The  left ventricle has low normal function. The left ventricle has no regional  wall motion abnormalities. The left ventricular internal cavity size was  mildly dilated. Left ventricular  diastolic parameters are indeterminate. The average left ventricular  global longitudinal strain is -17.6 %.   2. Right ventricular systolic function is normal. The right ventricular  size is normal. There is normal pulmonary artery systolic pressure.   3. The mitral valve is normal in structure. Trivial mitral valve  regurgitation. No evidence of mitral stenosis.   4. The aortic valve is tricuspid. Aortic valve regurgitation is not  visualized. No aortic stenosis is present.   5. The inferior vena cava is normal in size with greater than 50%  respiratory variability, suggesting right atrial pressure of 3 mmHg.   FINDINGS   Left Ventricle: Left ventricular ejection fraction, by estimation, is 50  to 55%. The left ventricle has low normal function. The left ventricle has  no regional wall motion abnormalities. The average left ventricular global  longitudinal strain is -17.6  %. The left ventricular internal cavity size was mildly dilated. There is  no left ventricular hypertrophy. Left ventricular diastolic parameters are  indeterminate.   Right Ventricle: The right ventricular  size is normal. No increase in  right ventricular wall thickness. Right ventricular systolic function is  normal. There is normal pulmonary artery systolic pressure. The tricuspid  regurgitant velocity is 2.03 m/s, and   with an assumed right atrial pressure of 3 mmHg, the estimated right  ventricular systolic pressure is 123456 mmHg.   Left Atrium: Left atrial size was normal in size.   Right Atrium: Right atrial size was normal in size.   Pericardium: There is no evidence of pericardial effusion.   Mitral Valve: The mitral valve is normal in structure. Normal mobility of  the mitral valve leaflets. Trivial mitral valve regurgitation. No evidence  of mitral valve stenosis.   Tricuspid Valve: The tricuspid valve is normal in structure. Tricuspid  valve regurgitation is trivial. No evidence of tricuspid stenosis.   Aortic Valve: The aortic valve is tricuspid. Aortic valve regurgitation is  not visualized. No aortic stenosis is present.   Pulmonic Valve: The pulmonic valve was normal in structure. Pulmonic valve  regurgitation is not visualized. No evidence of pulmonic stenosis.   Aorta: The aortic root is normal in size and structure.   Venous: The inferior vena cava is normal in size with greater than 50%  respiratory variability, suggesting right atrial pressure of 3 mmHg.   IAS/Shunts: No atrial level shunt detected by color flow Doppler.   Cardiac monitor 10/01/19  Sinus rhythm 2 episodes of ventricular tachycardia with longest at 9 beats.  Rare PVCs 3 short runs of supraventricular tachycardia, longest 12 beats  EKG:  EKG is  ordered today.  The ekg ordered today demonstrates sinus tachycardia rate 101 bpm  Recent Labs: 06/18/2022: Magnesium 2.2; TSH 0.652 07/24/2022: BUN 8; Creatinine, Ser 0.60; Hemoglobin 14.8; Platelets 358; Potassium 4.1; Sodium 142  Recent Lipid Panel No results found for: "CHOL", "TRIG", "HDL", "CHOLHDL", "VLDL", "LDLCALC", "LDLDIRECT"  Risk  Assessment/Calculations:   CHA2DS2-VASc Score = 2  This indicates a 2.2% annual risk of stroke. The patient's score is based upon: CHF History: 0 HTN History: 1 Diabetes History: 0 Stroke History: 0 Vascular Disease History: 0 Age Score: 0 Gender Score: 1      Home Medications   Current Meds  Medication Sig   albuterol (VENTOLIN HFA) 108 (90 Base) MCG/ACT inhaler Inhale 2 puffs into the lungs every 4 (four) hours as needed for wheezing or shortness of breath.   amphetamine-dextroamphetamine (ADDERALL XR) 30 MG 24 hr capsule Take 60 mg by mouth daily.   apixaban (ELIQUIS) 5 MG TABS tablet Take 1 tablet (5 mg total) by mouth 2 (two) times daily.   aspirin-acetaminophen-caffeine (EXCEDRIN MIGRAINE) 250-250-65 MG tablet Take 2 tablets by mouth every 6 (six) hours as needed for headache.   atorvastatin (LIPITOR) 10 MG tablet Take 1 tablet (10 mg total) by mouth daily.   benzonatate (TESSALON) 100 MG capsule Take 100-200 mg by mouth as needed.   cholecalciferol (VITAMIN D3) 25 MCG (1000 UT) tablet Take 1,000 Units by mouth daily.   [employer] VITAMIN B12 1000 MCG tablet Take 1,000 mcg by mouth daily.   diltiazem (CARDIZEM CD) 240 MG 24 hr capsule Take 1 capsule (240 mg total) by mouth daily.   diltiazem (CARDIZEM) 30 MG tablet Take 1 tablet (30 mg total) by mouth as needed.   escitalopram (LEXAPRO) 10 MG tablet Take 10 mg by mouth daily as needed (anxiety).   gabapentin (NEURONTIN) 100 MG capsule Take 100-600 mg by mouth 4 (four) times daily as needed (pain).   gabapentin (NEURONTIN) 300 MG capsule Take 600 mg by mouth at bedtime.   isosorbide mononitrate (IMDUR) 30 MG 24 hr tablet Take 0.5 tablets (15 mg total) by mouth daily.   lisdexamfetamine (VYVANSE) 60 MG capsule TAKE 1 CAPSULE BY MOUTH EVERY DAY IN THE MORNING FOR 90 DAYS   metoprolol tartrate (LOPRESSOR) 100 MG tablet Take 1 tablet by mouth 2 hours prior to CT   nitroGLYCERIN (NITROSTAT) 0.4 MG SL tablet Place 1 tablet (0.4 mg total)  under the tongue every 5 (five) minutes as needed for chest pain.   omeprazole (PRILOSEC) 20 MG capsule Take 20 mg by mouth daily.   podofilox (CONDYLOX) 0.5 % external solution Apply 1 application topically once a week.   zolpidem (AMBIEN) 10 MG tablet Take 10 mg by mouth at bedtime.     Review of Systems      All other systems reviewed and are otherwise negative except as noted above.  Physical Exam    VS:  BP 110/62   Pulse (!) 106   Ht '5\' 2"'$  (1.575 m)   Wt 179 lb 6.4 oz (81.4 kg)   SpO2 97%   BMI 32.81 kg/m  , BMI Body mass index is 32.81 kg/m.  Wt Readings from Last 3 Encounters:  09/01/22 179 lb 6.4 oz (81.4 kg)  08/03/22 177 lb 6.4 oz (80.5 kg)  07/24/22 181 lb (82.1 kg)     GEN: Well nourished, well developed, in no acute distress. HEENT: normal. Neck: Supple, no JVD, carotid bruits, or masses. Cardiac: RRR, no murmurs, rubs, or gallops. No clubbing, cyanosis, edema.  Radials/PT 2+ and equal bilaterally.  Respiratory:  Respirations regular and unlabored, RLL rhonchi. GI: Soft, nontender, nondistended. MS: No deformity or atrophy. Skin: Warm and dry, no rash. Neuro:  Strength and sensation are intact. Psych: Normal affect.  Assessment & Plan    Reduced EF and WMA /CAD/Chest pain -CTA performed 1/19  -FFR  results for coronary CT - there appears to be a flow-limitation in a D2 branch. This would be out of proportion to be the cause of the patient's cardiomyopathy.  -Imdur '15mg'$  daily added  -would recommend cardiac cath, discussed with patient today and she agrees to proceed  Palpitations/SVT/PVCs -zio monitor for further workup for 14 days-never received results -limit caffeine and alcohol -smoking cessation discussed -labs: CBC and BMP  Documented afib -she had an episode of racing HR and stopped at a fire station. They did an EKG and she was told she was in Afib and to report to the ED immediately. She states it lasted about 45 minutes -She tells me she  has had other episodes like this -continue Eliquis '5mg'$  BID -CHA2DS2-VASc of 2 with 2.2% risk of stroke -increase to  Cardizem CD '240mg'$ , PRN Cardizem '30mg'$  added today  4. Hypertension -BP better today -encouraged to keep track of her BP at home   The patient understands that risks include but are not limited to stroke (1 in 1000), death (1 in 1000), kidney failure [usually temporary] (1 in 500), bleeding (1 in 200), allergic reaction [possibly serious] (1 in 200), and agrees to proceed.     Disposition: Follow up 6 weeks with Lauree Chandler, MD or APP.  Signed, Elgie Collard, PA-C 09/01/2022, 9:48 AM [place]

## 2022-08-31 NOTE — Telephone Encounter (Signed)
Left msg for patient to return call.

## 2022-08-31 NOTE — Progress Notes (Unsigned)
Office Visit    Patient Name: JENNIFERLEE TREMONT Date of Encounter: 09/01/2022  PCP:  [name], [facility]  Cardiologist:  Lauree Chandler, MD  Advanced Practice Provider:  No care team member to display Electrophysiologist:  None   HPI    ALIZAYA MENZA is a 52 y.o. female with a past medical history significant for anxiety, fibromyalgia, GERD, arthritis, HLD, rheumatoid arthritis, tobacco abuse presents today for overdue follow-up appointment.  She was seen initially March 2021.  She had palpitations at home.  She felt like her heart was skipping a beat.  She also had dyspnea on exertion.  No chest pain.  She was very anxious.  She smokes 1 pack a day.  She had a normal exercise stress test March 2021.  Echo March 2021 with LVEF 50 to 55%.  No valve disease.  Cardiac monitor April 2021 with sinus rhythm, 2 short runs of VT (longest 9 beats), 3 short runs of SVT (longest 12 beats).  She is here for overdue follow-up.  She was last seen April 2021 and at that time she denied any CV symptoms.  She does have some palpitations and was having severe back pain at that time.  She was last seen 06/18/22 by myself and she told me that she had an episode of a racing heartbeat and felt so bad that she immediately reported it to a fire station.  There, they did an EKG and told her she was in atrial fibrillation and advised that she go to the ED for further evaluation.  She has had other episodes of this racing heartbeat.  She also sometimes endorses a feeling of her heart stopping and her waiting for it to restart.  She was taken off her Cardizem but she cannot remember why.  She also states that her blood pressure has been elevated especially her diastolic blood pressure.  Reports 170/100 and 150/100.  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  She does endorse insignificant  bleeding from the stones.  This has been going on for 5 years per patient report.  She was seen by me 07/24/21, She had a significant finding on her echocardiogram which revealed a reduced EF and some wall regional motion abnormality.  A coronary CTA scan will be arranged today.  She still is having some SOB. She has felt horrible from a URI/possible bronchitis. She went to her PCP's office and they did not want to start her on anything due to her Afib and the workup that we are doing. We discussed next steps will be determined after the CT scan. She can place the zio (which she already has) after her coronary CT scan. Her BP is elevated today here in the clinic and has been at home per the patient. She thought it was due to the Eliquis however, I explained that Eliquis should not affect BP. We discussed increasing her Cardizem today.  I saw her back in January 2024 and she states that she is under a lot of stress and her husband makes it worse.  She states that stress from taking care of him has started all of these health problems for her.  She is worried that she will drop dead.  She did take some magnesium and potassium and it relieved the pressure in her chest.  She is also on aspirin 325 mg.  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  She has got her husband off financially and emotionally because he is pulling her down from a health standpoint.  She is still having chest pains and a lot of shortness of breath.  Her shortness of breath is not proportional to any of the cardiac findings as far.  We reviewed her CT scan as well as her echocardiogram again.  She states that she just is running out of oxygen.  No swelling.  Weight has been stable.  She also is short of breath when eating and bending over.  She was given a large dose of metoprolol for the CT scan and it did not touch her heart rate.  She then needed to take a few nitroglycerin tabs and that lowered her heart rate.  Today, she shares with me  that he is very fatigued. She is limiting her activity and gets out of breath easily. Scared to get on a treadmill or do any physical activity.  We have had issues receiving her ZIO monitor x 2. Once in December we never got results and now her monitor from January. She tells me she dropped off at [employer].  There has been some miscommunication.  I have asked her to contact [employer] to see what happened.  We also discussed cardiac catheterization to determine what is causing her symptoms.  Her heart rate remains elevated today.  She remains on Cardizem for blood pressure and heart rate control.  We have started her on daily Lipitor and we will obtain an updated lipid panel.  Reports no shortness of breath nor dyspnea on exertion. Reports no chest pain, pressure, or tightness. No edema, orthopnea, PND. Reports no palpitations.   Past Medical History    Past Medical History:  Diagnosis Date   Anxiety    Arthralgia    Attention-deficit hyperactivity disorder, predominantly inattentive type    Back pain    Fear of flying    Fibromyalgia    Gastroesophageal reflux disease    HPV in female    Joint pain    Localized edema    Neck pain    OA (osteoarthritis)    Papillomavirus as the cause of diseases classified elsewhere    Primary insomnia    Pure hypercholesterolemia    Rheumatoid arthritis, seronegative, hand, unspecified laterality ([place])    patient feels it is back related, not a true arthritis   Spondylolisthesis of lumbosacral region    Tobacco dependence    Unspecified inflammatory spondylopathy, cervical region ([place])    Vitamin B12 deficiency anemia    Voice hoarseness    Past Surgical History:  Procedure Laterality Date   ABDOMINAL EXPOSURE N/A 06/28/2020   Procedure: ABDOMINAL EXPOSURE;  Surgeon: Marty Heck, MD;  Location: [facility] OR;  Service: Vascular;  Laterality: N/A;  anterior   ANTERIOR LUMBAR FUSION N/A 06/28/2020   Procedure: ANTERIOR LUMBAR INTERBODY FUSION LUMBAR  FIVE-SACRAL ONE.;  Surgeon: Eustace Moore, MD;  Location: [place];  Service: Neurosurgery;  Laterality: N/A;  anterior approach   BREAST ENHANCEMENT SURGERY     CARPAL TUNNEL RELEASE Left    CARPAL TUNNEL RELEASE Right 06/28/2020   Procedure: CARPAL TUNNEL RELEASE;  Surgeon: Eustace Moore, MD;  Location: [place];  Service: Neurosurgery;  Laterality: Right;   CERVICAL SPINE SURGERY     CHOLECYSTECTOMY     FOOT SURGERY     MANDIBLE SURGERY     TUBAL LIGATION     with ablation  TYMPANOSTOMY TUBE PLACEMENT      Allergies  Allergies  Allergen Reactions   Zoloft [Sertraline]     Altered mental state     EKGs/Labs/Other Studies Reviewed:   The following studies were reviewed today:  Echocardiogram 07/20/2021  IMPRESSIONS     1. Left ventricular ejection fraction, by estimation, is 35 to 40%. The  left ventricle has moderately decreased function. The left ventricle  demonstrates regional wall motion abnormalities (see scoring  diagram/findings for description). The left  ventricular internal cavity size was mildly dilated. Left ventricular  diastolic parameters are indeterminate. Mild global hypokinesis with  moderate hypokinesis of septal wall.   2. Right ventricular systolic function is normal. The right ventricular  size is normal.   3. Left atrial size was moderately dilated.   4. The mitral valve is normal in structure. Mild to moderate mitral valve  regurgitation. No evidence of mitral stenosis.   5. The aortic valve is grossly normal. There is mild calcification of the  aortic valve. Aortic valve regurgitation is not visualized. Aortic valve  sclerosis is present, with no evidence of aortic valve stenosis.   6. The inferior vena cava is normal in size with greater than 50%  respiratory variability, suggesting right atrial pressure of 3 mmHg.   7. Cannot exclude a small PFO.   Comparison(s): Prior images reviewed side by side. Changes from prior  study are noted. Prior  images with normal EF, maybe minimal abnormal  septal motion. EF reduced and septal motion worse compared to prior.   Conclusion(s)/Recommendation(s): Significantly changed compared to prior,  will contact Nicholes Rough and Dr. Angelena Form.   FINDINGS   Left Ventricle: Left ventricular ejection fraction, by estimation, is 35  to 40%. The left ventricle has moderately decreased function. The left  ventricle demonstrates regional wall motion abnormalities. Moderate  hypokinesis of the left ventricular,  entire anteroseptal wall and inferoseptal wall. Global longitudinal strain  performed but not reported based on interpreter judgement due to  suboptimal tracking. 3D left ventricular ejection fraction analysis  performed but not reported based on  interpreter judgement due to suboptimal tracking. The left ventricular  internal cavity size was mildly dilated. There is no left ventricular  hypertrophy. Left ventricular diastolic parameters are indeterminate.     LV Wall Scoring:  Mild global hypokinesis with moderate hypokinesis of septal wall.   Right Ventricle: The right ventricular size is normal. No increase in  right ventricular wall thickness. Right ventricular systolic function is  normal.   Left Atrium: Left atrial size was moderately dilated.   Right Atrium: Right atrial size was normal in size.   Pericardium: There is no evidence of pericardial effusion.   Mitral Valve: The mitral valve is normal in structure. There is mild  thickening of the mitral valve leaflet(s). Mild to moderate mitral valve  regurgitation. No evidence of mitral valve stenosis.   Tricuspid Valve: The tricuspid valve is normal in structure. Tricuspid  valve regurgitation is trivial. No evidence of tricuspid stenosis.   Aortic Valve: The aortic valve is grossly normal. There is mild  calcification of the aortic valve. Aortic valve regurgitation is not  visualized. Aortic valve sclerosis is present, with  no evidence of aortic  valve stenosis.   Pulmonic Valve: The pulmonic valve was grossly normal. Pulmonic valve  regurgitation is trivial. No evidence of pulmonic stenosis.   Aorta: The aortic root, ascending aorta, aortic arch and descending aorta  are all structurally normal, with no evidence of  dilitation or  obstruction.   Venous: The inferior vena cava is normal in size with greater than 50%  respiratory variability, suggesting right atrial pressure of 3 mmHg.   IAS/Shunts: Cannot exclude a small PFO.    Echocardiogram 09/28/19 IMPRESSIONS     1. Left ventricular ejection fraction, by estimation, is 50 to 55%. The  left ventricle has low normal function. The left ventricle has no regional  wall motion abnormalities. The left ventricular internal cavity size was  mildly dilated. Left ventricular  diastolic parameters are indeterminate. The average left ventricular  global longitudinal strain is -17.6 %.   2. Right ventricular systolic function is normal. The right ventricular  size is normal. There is normal pulmonary artery systolic pressure.   3. The mitral valve is normal in structure. Trivial mitral valve  regurgitation. No evidence of mitral stenosis.   4. The aortic valve is tricuspid. Aortic valve regurgitation is not  visualized. No aortic stenosis is present.   5. The inferior vena cava is normal in size with greater than 50%  respiratory variability, suggesting right atrial pressure of 3 mmHg.   FINDINGS   Left Ventricle: Left ventricular ejection fraction, by estimation, is 50  to 55%. The left ventricle has low normal function. The left ventricle has  no regional wall motion abnormalities. The average left ventricular global  longitudinal strain is -17.6  %. The left ventricular internal cavity size was mildly dilated. There is  no left ventricular hypertrophy. Left ventricular diastolic parameters are  indeterminate.   Right Ventricle: The right ventricular  size is normal. No increase in  right ventricular wall thickness. Right ventricular systolic function is  normal. There is normal pulmonary artery systolic pressure. The tricuspid  regurgitant velocity is 2.03 m/s, and   with an assumed right atrial pressure of 3 mmHg, the estimated right  ventricular systolic pressure is 123456 mmHg.   Left Atrium: Left atrial size was normal in size.   Right Atrium: Right atrial size was normal in size.   Pericardium: There is no evidence of pericardial effusion.   Mitral Valve: The mitral valve is normal in structure. Normal mobility of  the mitral valve leaflets. Trivial mitral valve regurgitation. No evidence  of mitral valve stenosis.   Tricuspid Valve: The tricuspid valve is normal in structure. Tricuspid  valve regurgitation is trivial. No evidence of tricuspid stenosis.   Aortic Valve: The aortic valve is tricuspid. Aortic valve regurgitation is  not visualized. No aortic stenosis is present.   Pulmonic Valve: The pulmonic valve was normal in structure. Pulmonic valve  regurgitation is not visualized. No evidence of pulmonic stenosis.   Aorta: The aortic root is normal in size and structure.   Venous: The inferior vena cava is normal in size with greater than 50%  respiratory variability, suggesting right atrial pressure of 3 mmHg.   IAS/Shunts: No atrial level shunt detected by color flow Doppler.   Cardiac monitor 10/01/19  Sinus rhythm 2 episodes of ventricular tachycardia with longest at 9 beats.  Rare PVCs 3 short runs of supraventricular tachycardia, longest 12 beats  EKG:  EKG is  ordered today.  The ekg ordered today demonstrates sinus tachycardia rate 101 bpm  Recent Labs: 06/18/2022: Magnesium 2.2; TSH 0.652 07/24/2022: BUN 8; Creatinine, Ser 0.60; Hemoglobin 14.8; Platelets 358; Potassium 4.1; Sodium 142  Recent Lipid Panel No results found for: "CHOL", "TRIG", "HDL", "CHOLHDL", "VLDL", "LDLCALC", "LDLDIRECT"  Risk  Assessment/Calculations:   CHA2DS2-VASc Score = 2  This indicates a 2.2% annual risk of stroke. The patient's score is based upon: CHF History: 0 HTN History: 1 Diabetes History: 0 Stroke History: 0 Vascular Disease History: 0 Age Score: 0 Gender Score: 1      Home Medications   Current Meds  Medication Sig   albuterol (VENTOLIN HFA) 108 (90 Base) MCG/ACT inhaler Inhale 2 puffs into the lungs every 4 (four) hours as needed for wheezing or shortness of breath.   amphetamine-dextroamphetamine (ADDERALL XR) 30 MG 24 hr capsule Take 60 mg by mouth daily.   apixaban (ELIQUIS) 5 MG TABS tablet Take 1 tablet (5 mg total) by mouth 2 (two) times daily.   aspirin-acetaminophen-caffeine (EXCEDRIN MIGRAINE) 250-250-65 MG tablet Take 2 tablets by mouth every 6 (six) hours as needed for headache.   atorvastatin (LIPITOR) 10 MG tablet Take 1 tablet (10 mg total) by mouth daily.   benzonatate (TESSALON) 100 MG capsule Take 100-200 mg by mouth as needed.   cholecalciferol (VITAMIN D3) 25 MCG (1000 UT) tablet Take 1,000 Units by mouth daily.   [employer] VITAMIN B12 1000 MCG tablet Take 1,000 mcg by mouth daily.   diltiazem (CARDIZEM CD) 240 MG 24 hr capsule Take 1 capsule (240 mg total) by mouth daily.   diltiazem (CARDIZEM) 30 MG tablet Take 1 tablet (30 mg total) by mouth as needed.   escitalopram (LEXAPRO) 10 MG tablet Take 10 mg by mouth daily as needed (anxiety).   gabapentin (NEURONTIN) 100 MG capsule Take 100-600 mg by mouth 4 (four) times daily as needed (pain).   gabapentin (NEURONTIN) 300 MG capsule Take 600 mg by mouth at bedtime.   isosorbide mononitrate (IMDUR) 30 MG 24 hr tablet Take 0.5 tablets (15 mg total) by mouth daily.   lisdexamfetamine (VYVANSE) 60 MG capsule TAKE 1 CAPSULE BY MOUTH EVERY DAY IN THE MORNING FOR 90 DAYS   metoprolol tartrate (LOPRESSOR) 100 MG tablet Take 1 tablet by mouth 2 hours prior to CT   nitroGLYCERIN (NITROSTAT) 0.4 MG SL tablet Place 1 tablet (0.4 mg total)  under the tongue every 5 (five) minutes as needed for chest pain.   omeprazole (PRILOSEC) 20 MG capsule Take 20 mg by mouth daily.   podofilox (CONDYLOX) 0.5 % external solution Apply 1 application topically once a week.   zolpidem (AMBIEN) 10 MG tablet Take 10 mg by mouth at bedtime.     Review of Systems      All other systems reviewed and are otherwise negative except as noted above.  Physical Exam    VS:  BP 110/62   Pulse (!) 106   Ht 5' 2"$  (1.575 m)   Wt 179 lb 6.4 oz (81.4 kg)   SpO2 97%   BMI 32.81 kg/m  , BMI Body mass index is 32.81 kg/m.  Wt Readings from Last 3 Encounters:  09/01/22 179 lb 6.4 oz (81.4 kg)  08/03/22 177 lb 6.4 oz (80.5 kg)  07/24/22 181 lb (82.1 kg)     GEN: Well nourished, well developed, in no acute distress. HEENT: normal. Neck: Supple, no JVD, carotid bruits, or masses. Cardiac: RRR, no murmurs, rubs, or gallops. No clubbing, cyanosis, edema.  Radials/PT 2+ and equal bilaterally.  Respiratory:  Respirations regular and unlabored, RLL rhonchi. GI: Soft, nontender, nondistended. MS: No deformity or atrophy. Skin: Warm and dry, no rash. Neuro:  Strength and sensation are intact. Psych: Normal affect.  Assessment & Plan    Reduced EF and WMA /CAD/Chest pain -CTA performed 1/19  -FFR  results for coronary CT - there appears to be a flow-limitation in a D2 branch. This would be out of proportion to be the cause of the patient's cardiomyopathy.  -Imdur 28m daily added  -would recommend cardiac cath, discussed with patient today and she agrees to proceed  Palpitations/SVT/PVCs -zio monitor for further workup for 14 days-never received results -limit caffeine and alcohol -smoking cessation discussed -labs: CBC and BMP  Documented afib -she had an episode of racing HR and stopped at a fire station. They did an EKG and she was told she was in Afib and to report to the ED immediately. She states it lasted about 45 minutes -She tells me she  has had other episodes like this -continue Eliquis 518mBID -CHA2DS2-VASc of 2 with 2.2% risk of stroke -increase to  Cardizem CD 24011mPRN Cardizem 51m70mded today  4. Hypertension -BP better today -encouraged to keep track of her BP at home   The patient understands that risks include but are not limited to stroke (1 in 1000), death (1 in 1000), kidney failure [usually temporary] (1 in 500), bleeding (1 in 200), allergic reaction [possibly serious] (1 in 200), and agrees to proceed.     Disposition: Follow up 6 weeks with ChriLauree Chandler or APP.  Signed, TessElgie Collard-C 09/01/2022, 9:48 AM [facility]

## 2022-08-31 NOTE — Telephone Encounter (Signed)
Pt would like a call back to make sure her monitor results were in before she came to her appt tomorrow. If they have not she would like to reschedule.

## 2022-09-01 ENCOUNTER — Encounter: Payer: Self-pay | Admitting: Physician Assistant

## 2022-09-01 ENCOUNTER — Ambulatory Visit: Payer: BC Managed Care – PPO | Attending: Nurse Practitioner | Admitting: Physician Assistant

## 2022-09-01 VITALS — BP 110/62 | HR 106 | Ht 62.0 in | Wt 179.4 lb

## 2022-09-01 DIAGNOSIS — R002 Palpitations: Secondary | ICD-10-CM

## 2022-09-01 DIAGNOSIS — I493 Ventricular premature depolarization: Secondary | ICD-10-CM | POA: Diagnosis not present

## 2022-09-01 DIAGNOSIS — R0602 Shortness of breath: Secondary | ICD-10-CM

## 2022-09-01 DIAGNOSIS — I25118 Atherosclerotic heart disease of native coronary artery with other forms of angina pectoris: Secondary | ICD-10-CM | POA: Diagnosis not present

## 2022-09-01 DIAGNOSIS — I4891 Unspecified atrial fibrillation: Secondary | ICD-10-CM | POA: Diagnosis not present

## 2022-09-01 DIAGNOSIS — I10 Essential (primary) hypertension: Secondary | ICD-10-CM

## 2022-09-01 DIAGNOSIS — I471 Supraventricular tachycardia, unspecified: Secondary | ICD-10-CM

## 2022-09-01 LAB — CBC

## 2022-09-01 MED ORDER — ATORVASTATIN CALCIUM 10 MG PO TABS: 10.0000 mg | ORAL_TABLET | Freq: Every day | ORAL | 3 refills | Status: DC

## 2022-09-01 MED ORDER — DILTIAZEM HCL 30 MG PO TABS: 30.0000 mg | ORAL_TABLET | ORAL | 1 refills | Status: DC | PRN

## 2022-09-01 NOTE — Patient Instructions (Signed)
Medication Instructions:  1.Start atorvastatin (Lipitor) 10 mg daily *If you need a refill on your cardiac medications before your next appointment, please call your pharmacy*   Lab Work: Lipid, lft's, CBC and BMP today If you have labs (blood work) drawn today and your tests are completely normal, you will receive your results only by: [place] (if you have MyChart) OR A paper copy in the mail If you have any lab test that is abnormal or we need to change your treatment, we will call you to review the results.   Testing/Procedures:       Cardiac/Peripheral Catheterization   You are scheduled for a Cardiac Catheterization on Monday, February 26 with Dr. Harrell Gave End.  1. Please arrive at the Main Entrance A at [facility]: [place], [place] [address] on February 26 at 5:30 AM (This time is two hours before your procedure to ensure your preparation). Free valet parking service is available. You will check in at ADMITTING. The support person will be asked to wait in the waiting room.  It is OK to have someone drop you off and come back when you are ready to be discharged.        Special note: Every effort is made to have your procedure done on time. Please understand that emergencies sometimes delay scheduled procedures.   . 2. Diet: Do not eat solid foods after midnight.  You may have clear liquids until 5 AM the day of the procedure.  3. Labs: You will need to have blood drawn today  4. Medication instructions in preparation for your procedure:   Contrast Allergy: No  Stop taking Eliquis (Apixiban) 2 days prior  On the morning of your procedure, take Aspirin 81 mg and any morning medicines NOT listed above.  You may use sips of water.  5. Plan to go home the same day, you will only stay overnight if medically necessary. 6. You MUST have a responsible adult to drive you home. 7. An adult MUST be with you the first 24 hours after you arrive  home. 8. Bring a current list of your medications, and the last time and date medication taken. 9. Bring ID and current insurance cards. 10.Please wear clothes that are easy to get on and off and wear slip-on shoes.  Thank you for allowing [place] to care for you!   -- [facility]    Follow-Up: At [facility], you and your health needs are our priority.  As part of our continuing mission to provide you with exceptional heart care, we have created designated Provider Care Teams.  These Care Teams include your primary Cardiologist (physician) and Advanced Practice Providers (APPs -  Physician Assistants and Nurse Practitioners) who all work together to provide you with the care you need, when you need it.   Your next appointment:   6 week(s)  Provider:   Lauree Chandler, MD  or APP

## 2022-09-02 LAB — CBC
Hematocrit: 43.5 % (ref 34.0–46.6)
Hemoglobin: 13.9 g/dL (ref 11.1–15.9)
MCH: 29.8 pg (ref 26.6–33.0)
MCHC: 32 g/dL (ref 31.5–35.7)
MCV: 93 fL (ref 79–97)
Platelets: 313 10*3/uL (ref 150–450)
RBC: 4.67 x10E6/uL (ref 3.77–5.28)
RDW: 13.6 % (ref 11.7–15.4)
WBC: 6.4 10*3/uL (ref 3.4–10.8)

## 2022-09-02 LAB — HEPATIC FUNCTION PANEL
ALT: 11 IU/L (ref 0–32)
AST: 13 IU/L (ref 0–40)
Albumin: 4.5 g/dL (ref 3.8–4.9)
Alkaline Phosphatase: 117 IU/L (ref 44–121)
Bilirubin Total: 0.2 mg/dL (ref 0.0–1.2)
Bilirubin, Direct: <0.1 mg/dL (ref 0.00–0.40)
Total Protein: 6.8 g/dL (ref 6.0–8.5)

## 2022-09-02 LAB — BASIC METABOLIC PANEL
BUN/Creatinine Ratio: 10 (ref 9–23)
BUN: 7 mg/dL (ref 6–24)
CO2: 27 mmol/L (ref 20–29)
Calcium: 9.4 mg/dL (ref 8.7–10.2)
Chloride: 99 mmol/L (ref 96–106)
Creatinine, Ser: 0.67 mg/dL (ref 0.57–1.00)
Glucose: 100 mg/dL — ABNORMAL HIGH (ref 70–99)
Potassium: 4.6 mmol/L (ref 3.5–5.2)
Sodium: 139 mmol/L (ref 134–144)
eGFR: 105 mL/min/{1.73_m2} (ref >59)

## 2022-09-02 LAB — LIPID PANEL
Chol/HDL Ratio: 3.8 ratio (ref 0.0–4.4)
Cholesterol, Total: 230 mg/dL — ABNORMAL HIGH (ref 100–199)
HDL: 60 mg/dL (ref >39)
LDL Chol Calc (NIH): 137 mg/dL — ABNORMAL HIGH (ref 0–99)
Triglycerides: 187 mg/dL — ABNORMAL HIGH (ref 0–149)
VLDL Cholesterol Cal: 33 mg/dL (ref 5–40)

## 2022-09-03 ENCOUNTER — Telehealth: Payer: Self-pay | Admitting: *Deleted

## 2022-09-03 NOTE — Telephone Encounter (Signed)
Cardiac Catheterization scheduled at [facility] for: Monday September 07, 2022 7:30 AM Arrival time and place: [place] Entrance A at: 5:30 AM  Nothing to eat after midnight prior to procedure, clear liquids until 5 AM day of procedure.  Medication instructions: -Hold:  Eliquis-none 09/05/22 until post procedure -Other usual morning medications can be taken with sips of water including aspirin 81 mg.  Confirmed patient has responsible adult to drive home post procedure and be with patient first 24 hours after arriving home.  Patient reports no new symptoms concerning for COVID-19 in the past 10 days.  Reviewed procedure instructions with patient.

## 2022-09-07 ENCOUNTER — Other Ambulatory Visit: Payer: Self-pay

## 2022-09-07 ENCOUNTER — Encounter (HOSPITAL_COMMUNITY)
Admission: RE | Disposition: A | Discharge status: Home / Self Care | Payer: Self-pay | Source: Home / Self Care | Attending: Internal Medicine

## 2022-09-07 ENCOUNTER — Ambulatory Visit (HOSPITAL_COMMUNITY)
Admission: RE | Admit: 2022-09-07 | Discharge: 2022-09-07 | Disposition: A | Discharge status: Home / Self Care | Payer: BC Managed Care – PPO | Attending: Internal Medicine | Admitting: Internal Medicine

## 2022-09-07 ENCOUNTER — Telehealth: Payer: Self-pay | Admitting: Cardiovascular Disease

## 2022-09-07 DIAGNOSIS — I480 Paroxysmal atrial fibrillation: Secondary | ICD-10-CM | POA: Diagnosis not present

## 2022-09-07 DIAGNOSIS — F419 Anxiety disorder, unspecified: Secondary | ICD-10-CM | POA: Insufficient documentation

## 2022-09-07 DIAGNOSIS — I471 Supraventricular tachycardia, unspecified: Secondary | ICD-10-CM | POA: Diagnosis not present

## 2022-09-07 DIAGNOSIS — I2584 Coronary atherosclerosis due to calcified coronary lesion: Secondary | ICD-10-CM | POA: Insufficient documentation

## 2022-09-07 DIAGNOSIS — E785 Hyperlipidemia, unspecified: Secondary | ICD-10-CM | POA: Insufficient documentation

## 2022-09-07 DIAGNOSIS — I2510 Atherosclerotic heart disease of native coronary artery without angina pectoris: Secondary | ICD-10-CM | POA: Insufficient documentation

## 2022-09-07 DIAGNOSIS — F17210 Nicotine dependence, cigarettes, uncomplicated: Secondary | ICD-10-CM | POA: Insufficient documentation

## 2022-09-07 DIAGNOSIS — R002 Palpitations: Secondary | ICD-10-CM | POA: Diagnosis not present

## 2022-09-07 DIAGNOSIS — R931 Abnormal findings on diagnostic imaging of heart and coronary circulation: Secondary | ICD-10-CM

## 2022-09-07 DIAGNOSIS — Z7901 Long term (current) use of anticoagulants: Secondary | ICD-10-CM | POA: Diagnosis not present

## 2022-09-07 DIAGNOSIS — I429 Cardiomyopathy, unspecified: Secondary | ICD-10-CM | POA: Diagnosis not present

## 2022-09-07 DIAGNOSIS — I493 Ventricular premature depolarization: Secondary | ICD-10-CM | POA: Insufficient documentation

## 2022-09-07 DIAGNOSIS — M069 Rheumatoid arthritis, unspecified: Secondary | ICD-10-CM | POA: Diagnosis not present

## 2022-09-07 DIAGNOSIS — Z79899 Other long term (current) drug therapy: Secondary | ICD-10-CM | POA: Diagnosis not present

## 2022-09-07 DIAGNOSIS — K219 Gastro-esophageal reflux disease without esophagitis: Secondary | ICD-10-CM | POA: Diagnosis not present

## 2022-09-07 DIAGNOSIS — I10 Essential (primary) hypertension: Secondary | ICD-10-CM | POA: Diagnosis not present

## 2022-09-07 DIAGNOSIS — M797 Fibromyalgia: Secondary | ICD-10-CM | POA: Diagnosis not present

## 2022-09-07 HISTORY — PX: RIGHT/LEFT HEART CATH AND CORONARY ANGIOGRAPHY: CATH118266

## 2022-09-07 HISTORY — PX: CORONARY PRESSURE/FFR STUDY: CATH118243

## 2022-09-07 LAB — POCT ACTIVATED CLOTTING TIME
Activated Clotting Time: 277 seconds
Activated Clotting Time: 293 seconds

## 2022-09-07 LAB — POCT I-STAT 7, (LYTES, BLD GAS, ICA,H+H)
Acid-base deficit: 1 mmol/L (ref 0.0–2.0)
Bicarbonate: 24.2 mmol/L (ref 20.0–28.0)
Calcium, Ion: 1.2 mmol/L (ref 1.15–1.40)
HCT: 38 % (ref 36.0–46.0)
Hemoglobin: 12.9 g/dL (ref 12.0–15.0)
O2 Saturation: 93 %
Potassium: 3.5 mmol/L (ref 3.5–5.1)
Sodium: 141 mmol/L (ref 135–145)
TCO2: 25 mmol/L (ref 22–32)
pCO2 arterial: 39.9 mmHg (ref 32–48)
pH, Arterial: 7.39 (ref 7.35–7.45)
pO2, Arterial: 68 mmHg — ABNORMAL LOW (ref 83–108)

## 2022-09-07 LAB — POCT I-STAT EG7
Acid-base deficit: 1 mmol/L (ref 0.0–2.0)
Bicarbonate: 24.8 mmol/L (ref 20.0–28.0)
Calcium, Ion: 1.13 mmol/L — ABNORMAL LOW (ref 1.15–1.40)
HCT: 36 % (ref 36.0–46.0)
Hemoglobin: 12.2 g/dL (ref 12.0–15.0)
O2 Saturation: 61 %
Potassium: 3.3 mmol/L — ABNORMAL LOW (ref 3.5–5.1)
Sodium: 143 mmol/L (ref 135–145)
TCO2: 26 mmol/L (ref 22–32)
pCO2, Ven: 43.5 mmHg — ABNORMAL LOW (ref 44–60)
pH, Ven: 7.364 (ref 7.25–7.43)
pO2, Ven: 33 mmHg (ref 32–45)

## 2022-09-07 SURGERY — RIGHT/LEFT HEART CATH AND CORONARY ANGIOGRAPHY
Anesthesia: LOCAL

## 2022-09-07 MED ORDER — MIDAZOLAM HCL 2 MG/2ML IJ SOLN
INTRAMUSCULAR | Status: DC | PRN
  Administered 2022-09-07 (×3): 1 mg via INTRAVENOUS

## 2022-09-07 MED ORDER — FENTANYL CITRATE (PF) 100 MCG/2ML IJ SOLN
INTRAMUSCULAR | Status: DC | PRN
  Administered 2022-09-07 (×2): 25 ug via INTRAVENOUS

## 2022-09-07 MED ORDER — LABETALOL HCL 5 MG/ML IV SOLN: 10.0000 mg | INTRAVENOUS | Status: DC | PRN

## 2022-09-07 MED ORDER — FENTANYL CITRATE (PF) 100 MCG/2ML IJ SOLN
INTRAMUSCULAR | Status: AC
  Filled 2022-09-07: qty 2

## 2022-09-07 MED ORDER — LIDOCAINE HCL (PF) 1 % IJ SOLN
INTRAMUSCULAR | Status: DC | PRN
  Administered 2022-09-07 (×2): 2 mL

## 2022-09-07 MED ORDER — HEPARIN SODIUM (PORCINE) 1000 UNIT/ML IJ SOLN
INTRAMUSCULAR | Status: DC | PRN
  Administered 2022-09-07: 2000 [IU] via INTRAVENOUS
  Administered 2022-09-07 (×2): 4000 [IU] via INTRAVENOUS

## 2022-09-07 MED ORDER — SODIUM CHLORIDE 0.9% FLUSH: 3.0000 mL | Freq: Two times a day (BID) | INTRAVENOUS | Status: DC

## 2022-09-07 MED ORDER — IOHEXOL 350 MG/ML SOLN
INTRAVENOUS | Status: DC | PRN
  Administered 2022-09-07: 120 mL

## 2022-09-07 MED ORDER — VERAPAMIL HCL 2.5 MG/ML IV SOLN
INTRAVENOUS | Status: AC
  Filled 2022-09-07: qty 2

## 2022-09-07 MED ORDER — NITROGLYCERIN 1 MG/10 ML FOR IR/CATH LAB
INTRA_ARTERIAL | Status: AC
  Filled 2022-09-07: qty 10

## 2022-09-07 MED ORDER — MIDAZOLAM HCL 2 MG/2ML IJ SOLN
INTRAMUSCULAR | Status: AC
  Filled 2022-09-07: qty 2

## 2022-09-07 MED ORDER — VERAPAMIL HCL 2.5 MG/ML IV SOLN
INTRAVENOUS | Status: DC | PRN
  Administered 2022-09-07: 10 mL via INTRA_ARTERIAL

## 2022-09-07 MED ORDER — LIDOCAINE HCL (PF) 1 % IJ SOLN
INTRAMUSCULAR | Status: AC
  Filled 2022-09-07: qty 30

## 2022-09-07 MED ORDER — HYDRALAZINE HCL 20 MG/ML IJ SOLN: 10.0000 mg | INTRAMUSCULAR | Status: DC | PRN

## 2022-09-07 MED ORDER — SODIUM CHLORIDE 0.9 % IV SOLN: 250.0000 mL | INTRAVENOUS | Status: DC | PRN

## 2022-09-07 MED ORDER — ACETAMINOPHEN 325 MG PO TABS
650.0000 mg | ORAL_TABLET | ORAL | Status: DC | PRN
  Administered 2022-09-07: 650 mg via ORAL
  Filled 2022-09-07: qty 2

## 2022-09-07 MED ORDER — ASPIRIN 81 MG PO CHEW: 81.0000 mg | CHEWABLE_TABLET | ORAL | Status: DC

## 2022-09-07 MED ORDER — ONDANSETRON HCL 4 MG/2ML IJ SOLN: 4.0000 mg | Freq: Four times a day (QID) | INTRAMUSCULAR | Status: DC | PRN

## 2022-09-07 MED ORDER — SODIUM CHLORIDE 0.9% FLUSH: 3.0000 mL | INTRAVENOUS | Status: DC | PRN

## 2022-09-07 MED ORDER — NITROGLYCERIN 1 MG/10 ML FOR IR/CATH LAB
INTRA_ARTERIAL | Status: DC | PRN
  Administered 2022-09-07 (×2): 200 ug via INTRACORONARY

## 2022-09-07 MED ORDER — HEPARIN (PORCINE) IN NACL 1000-0.9 UT/500ML-% IV SOLN
INTRAVENOUS | Status: DC | PRN
  Administered 2022-09-07 (×2): 500 mL

## 2022-09-07 MED ORDER — METOPROLOL SUCCINATE ER 25 MG PO TB24: 25.0000 mg | ORAL_TABLET | Freq: Every day | ORAL | 99 refills | Status: DC

## 2022-09-07 MED ORDER — SODIUM CHLORIDE 0.9 % IV SOLN: INTRAVENOUS | Status: DC

## 2022-09-07 MED ORDER — HEPARIN SODIUM (PORCINE) 1000 UNIT/ML IJ SOLN
INTRAMUSCULAR | Status: AC
  Filled 2022-09-07: qty 10

## 2022-09-07 SURGICAL SUPPLY — 18 items
BAND ZEPHYR COMPRESS 30 LONG (HEMOSTASIS) IMPLANT
CATH BALLN WEDGE 5F 110CM (CATHETERS) IMPLANT
CATH INFINITI 5 FR JL3.5 (CATHETERS) IMPLANT
CATH INFINITI JR4 5F (CATHETERS) IMPLANT
CATH LAUNCHER 5F JR4 (CATHETERS) IMPLANT
CATH VISTA GUIDE 6FR XBLAD3.0 (CATHETERS) IMPLANT
GLIDESHEATH SLEND SS 6F .021 (SHEATH) IMPLANT
GUIDEWIRE INQWIRE 1.5J.035X260 (WIRE) IMPLANT
GUIDEWIRE PRESSURE X 175 (WIRE) IMPLANT
INQWIRE 1.5J .035X260CM (WIRE) ×1
KIT ESSENTIALS PG (KITS) IMPLANT
KIT HEART LEFT (KITS) ×1 IMPLANT
PACK CARDIAC CATHETERIZATION (CUSTOM PROCEDURE TRAY) ×1 IMPLANT
SHEATH GLIDE SLENDER 4/5FR (SHEATH) IMPLANT
SHEATH PROBE COVER 6X72 (BAG) IMPLANT
SYR MEDRAD MARK 7 150ML (SYRINGE) ×1 IMPLANT
TRANSDUCER W/STOPCOCK (MISCELLANEOUS) ×1 IMPLANT
TUBING CIL FLEX 10 FLL-RA (TUBING) ×1 IMPLANT

## 2022-09-07 NOTE — Progress Notes (Signed)
TR BAND REMOVAL  LOCATION:    right radial  DEFLATED PER PROTOCOL:    Yes.    TIME BAND OFF / DRESSING APPLIED:    1110 gauze dressing applied    SITE UPON ARRIVAL:    Level 0  SITE AFTER BAND REMOVAL:    Level 0  CIRCULATION SENSATION AND MOVEMENT:    Within Normal Limits   Yes.    COMMENTS:   no issues noted

## 2022-09-07 NOTE — Discharge Instructions (Signed)

## 2022-09-07 NOTE — Interval H&P Note (Signed)
History and Physical Interval Note:  09/07/2022 7:15 AM  [name]  has presented today for surgery, with the diagnosis of cardiomyopathy and coronary artery disease.  The various methods of treatment have been discussed with the patient and family. After consideration of risks, benefits and other options for treatment, the patient has consented to  Procedure(s): RIGHT/LEFT HEART CATH AND CORONARY ANGIOGRAPHY (N/A) as a surgical intervention.  The patient's history has been reviewed, patient examined, no change in status, stable for surgery.  I have reviewed the patient's chart and labs.  Questions were answered to the patient's satisfaction.    Cath Lab Visit (complete for each Cath Lab visit)  Clinical Evaluation Leading to the Procedure:   ACS: No.  Non-ACS:    Anginal Classification: CCS IV  Anti-ischemic medical therapy: Maximal Therapy (2 or more classes of medications)  Non-Invasive Test Results: Intermediate-risk stress test findings: cardiac mortality 1-3%/year (coronary CTA with CT FFR positive lesion involving diagonal branch; LVEF 35-40% by echo)  Prior CABG: No previous CABG  Trammell Bowden

## 2022-09-07 NOTE — Telephone Encounter (Signed)
Pt is requesting call back for lab results and to also as questions in regards to an upcoming procedure she is to have.

## 2022-09-07 NOTE — Telephone Encounter (Signed)
See result notes for details.

## 2022-09-08 ENCOUNTER — Encounter (HOSPITAL_COMMUNITY): Payer: Self-pay | Admitting: Internal Medicine

## 2022-09-09 ENCOUNTER — Other Ambulatory Visit: Payer: Self-pay | Admitting: *Deleted

## 2022-09-09 MED ORDER — ATORVASTATIN CALCIUM 80 MG PO TABS: 80.0000 mg | ORAL_TABLET | Freq: Every day | ORAL | 3 refills | Status: DC

## 2022-09-10 ENCOUNTER — Encounter (HOSPITAL_COMMUNITY): Payer: Self-pay | Admitting: Internal Medicine

## 2022-09-11 ENCOUNTER — Telehealth: Payer: Self-pay | Admitting: Cardiovascular Disease

## 2022-09-11 ENCOUNTER — Ambulatory Visit: Payer: Self-pay | Admitting: Emergency Medicine

## 2022-09-11 NOTE — Heart Team MDD (Signed)
   Heart Team Multi-Disciplinary Discussion  Patient: [name]  DOB: 04/16/1971  MRN: [id number]   Date: 09/11/2022  10:21 AM    Attendees: Interventional Cardiology: Glenetta Hew, MD Adrian Prows, MD Vernell Leep, MD Kathlyn Sacramento, MD Nelva Bush, MD Casandra Doffing, MD Sherren Mocha, MD Lauree Chandler, MD  Cardiothoracic Surgery: Coralie Common, MD Melodie Bouillon, MD     Patient History: Patient is a 52 y.o. female with a past medical history significant for anxiety, fibromyalgia, GERD, arthritis, HLD, rheumatoid arthritis, tobacco abuse.  She has reported palpitations, shortness of breath, dyspnea on exertion, and anxiety. She had a normal exercise stress test March 2021.  Echo March 2021 with LVEF 50 to 55%.  No valve disease.  Cardiac monitor in April 2021 with sinus rhythm, 2 short runs of VT (longest 9 beats), 3 short runs of SVT (longest 12 beats). Echo on 07/20/22 revealed reduced EF and some wall regional motion abnormality.  A coronary CTA scan and right and left heart cath were completed recently revealing significant multivessel disease.    Risk Factors: Tobacco Abuse Hyperlipidemia Hypertension     Review of Prior Angiography and PCI Procedures: The Echocardiograms completed in 2021 and on 07/20/2022 were reviewed and discussed in detail. The coronary CTA and R/L heart cath were also reviewed and discussed in detail.     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   Recommendations: CABG Additional Recommendations/Special Instructions: Given complex anatomy of LAD/D2 disease, consensus on recommendation for cardiac surgery consultation as the preferred treatment option at this time.      Dan Europe, RN  09/11/2022 10:21 AM

## 2022-09-11 NOTE — Telephone Encounter (Signed)
Patient states she needs a referral to Dr. Jomarie Longs. Please advise

## 2022-09-17 ENCOUNTER — Encounter: Payer: BC Managed Care – PPO | Admitting: Cardiothoracic Surgery

## 2022-09-21 ENCOUNTER — Encounter: Payer: Self-pay | Admitting: Surgery

## 2022-09-21 ENCOUNTER — Institutional Professional Consult (permissible substitution): Payer: BC Managed Care – PPO | Admitting: Surgery

## 2022-09-21 VITALS — BP 154/90 | HR 88 | Resp 20 | Ht 62.0 in | Wt 180.0 lb

## 2022-09-21 DIAGNOSIS — I2510 Atherosclerotic heart disease of native coronary artery without angina pectoris: Secondary | ICD-10-CM | POA: Diagnosis not present

## 2022-09-21 NOTE — Progress Notes (Signed)
Cardiothoracic Surgery Consultation  PCP is [name], MD Referring Provider is End, Harrell Gave, MD  Chief Complaint  Patient presents with   Coronary Artery Disease    Surgical consult, Cardiac Cath 09/07/22/ ECHO 07/20/22    HPI:  The patient is a 52 year old woman with a history of hyperlipidemia, hypertension, ongoing 1 pack/day smoking, fibromyalgia, rheumatoid arthritis, GERD, and anxiety who has had episodes of tachypalpitations and tachycardia for the past few years.  She said that she had an EKG done during one of these episodes when she went to the fire station and she was told that she was in atrial fibrillation.  She also reports a history of exertional substernal chest discomfort radiating to her left shoulder as well as exertional shortness of breath and fatigue.  Bending over to lift things up also brings on the symptoms.  A 2D echocardiogram on 07/20/2022 showed a moderately decreased left ventricular ejection fraction of 35 to 40% with mild global hypokinesis and moderate septal hypokinesis.  There was mild to moderate mitral regurgitation.  Her previous echocardiogram in March 2021 showed a left ventricular ejection fraction of 50 to 55% with trivial mitral regurgitation.  She underwent a calcium scoring CT on 07/31/2022 showing a coronary calcium score of 909 which was the 99th percentile.  CT FFR showed a flow-limiting stenosis in a large second diagonal branch.  Cardiac catheterization on 09/07/2022 showed sequential 90% and 70% lesions in the LAD flanking an aneurysmal segment where a large second diagonal branch arises from.  The second diagonal branch had about 70% proximal stenosis.  The right coronary artery has a segmental 60% concentric mid vessel stenosis.  The RFR in the mid RCA was 0.94.  The RFR in the LAD was 0.46.  Left heart filling pressures were mildly elevated with an LVEDP of 17 and pulm capillary wedge of 13.  Right heart pressures were normal.  She  continues to smoke 1 pack of cigarettes per day and has since she was a teenager.  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  Past Medical History:  Diagnosis Date   Anxiety    Arthralgia    Attention-deficit hyperactivity disorder, predominantly inattentive type    Back pain    Fear of flying    Fibromyalgia    Gastroesophageal reflux disease    HPV in female    Joint pain    Localized edema    Neck pain    OA (osteoarthritis)    Papillomavirus as the cause of diseases classified elsewhere    Primary insomnia    Pure hypercholesterolemia    Rheumatoid arthritis, seronegative, hand, unspecified laterality ([place])    patient feels it is back related, not a true arthritis   Spondylolisthesis of lumbosacral region    Tobacco dependence    Unspecified inflammatory spondylopathy, cervical region ([place])    Vitamin B12 deficiency anemia    Voice hoarseness     Past Surgical History:  Procedure Laterality Date   ABDOMINAL EXPOSURE N/A 06/28/2020   Procedure: ABDOMINAL EXPOSURE;  Surgeon: Marty Heck, MD;  Location: [facility] OR;  Service: Vascular;  Laterality: N/A;  anterior   ANTERIOR LUMBAR FUSION N/A 06/28/2020   Procedure: ANTERIOR LUMBAR INTERBODY FUSION LUMBAR FIVE-SACRAL ONE.;  Surgeon: Eustace Moore, MD;  Location: [place];  Service: Neurosurgery;  Laterality: N/A;  anterior approach   BREAST ENHANCEMENT SURGERY     CARPAL TUNNEL RELEASE Left    CARPAL TUNNEL RELEASE Right 06/28/2020  Procedure: CARPAL TUNNEL RELEASE;  Surgeon: Eustace Moore, MD;  Location: [place];  Service: Neurosurgery;  Laterality: Right;   CERVICAL SPINE SURGERY     CHOLECYSTECTOMY     FOOT SURGERY     INTRAVASCULAR PRESSURE WIRE/FFR STUDY N/A 09/07/2022   Procedure: INTRAVASCULAR PRESSURE WIRE/FFR STUDY;  Surgeon: Nelva Bush, MD;  Location: [place] [facility];  Service: Cardiovascular;  Laterality: N/A;   MANDIBLE SURGERY     RIGHT/LEFT HEART CATH AND CORONARY ANGIOGRAPHY N/A 09/07/2022    Procedure: RIGHT/LEFT HEART CATH AND CORONARY ANGIOGRAPHY;  Surgeon: Nelva Bush, MD;  Location: [place] [facility];  Service: Cardiovascular;  Laterality: N/A;   TUBAL LIGATION     with ablation   TYMPANOSTOMY TUBE PLACEMENT      Family History  Problem Relation Age of Onset   Hypertension Father    Heart disease Father     Social History Social History   Tobacco Use   Smoking status: Every Day    Packs/day: 1.00    Types: Cigarettes   Smokeless tobacco: Never  Substance Use Topics   Alcohol use: Yes    Comment: occasional   Drug use: Never    Current Outpatient Medications  Medication Sig Dispense Refill   albuterol (VENTOLIN HFA) 108 (90 Base) MCG/ACT inhaler Inhale 2 puffs into the lungs every 4 (four) hours as needed for wheezing or shortness of breath.     amphetamine-dextroamphetamine (ADDERALL XR) 30 MG 24 hr capsule Take 60 mg by mouth daily.     apixaban (ELIQUIS) 5 MG TABS tablet Take 1 tablet (5 mg total) by mouth 2 (two) times daily. 180 tablet 3   aspirin-acetaminophen-caffeine (EXCEDRIN MIGRAINE) 250-250-65 MG tablet Take 2 tablets by mouth every 6 (six) hours as needed for headache.     atorvastatin (LIPITOR) 80 MG tablet Take 1 tablet (80 mg total) by mouth daily. 90 tablet 3   benzonatate (TESSALON) 100 MG capsule Take 100-200 mg by mouth as needed.     cholecalciferol (VITAMIN D3) 25 MCG (1000 UT) tablet Take 1,000 Units by mouth daily.     CVS VITAMIN B12 1000 MCG tablet Take 1,000 mcg by mouth daily.     diltiazem (CARDIZEM) 30 MG tablet Take 1 tablet (30 mg total) by mouth as needed. 60 tablet 1   escitalopram (LEXAPRO) 10 MG tablet Take 10 mg by mouth daily as needed (anxiety).     gabapentin (NEURONTIN) 100 MG capsule Take 100-600 mg by mouth 4 (four) times daily as needed (pain).     gabapentin (NEURONTIN) 300 MG capsule Take 600 mg by mouth at bedtime.     isosorbide mononitrate (IMDUR) 30 MG 24 hr tablet Take 0.5 tablets (15 mg total) by mouth  daily. 45 tablet 3   lisdexamfetamine (VYVANSE) 60 MG capsule TAKE 1 CAPSULE BY MOUTH EVERY DAY IN THE MORNING FOR 90 DAYS     metoprolol succinate (TOPROL XL) 25 MG 24 hr tablet Take 1 tablet (25 mg total) by mouth daily. 25 tablet PRN   nitroGLYCERIN (NITROSTAT) 0.4 MG SL tablet Place 1 tablet (0.4 mg total) under the tongue every 5 (five) minutes as needed for chest pain. 25 tablet 3   omeprazole (PRILOSEC) 20 MG capsule Take 20 mg by mouth daily.     podofilox (CONDYLOX) 0.5 % external solution Apply 1 application topically once a week.     zolpidem (AMBIEN) 10 MG tablet Take 10 mg by mouth at bedtime.     No  current facility-administered medications for this visit.    Allergies  Allergen Reactions   Zoloft [Sertraline]     Altered mental state    Review of Systems  Constitutional:  Positive for activity change and fatigue.  HENT: Negative.    Eyes: Negative.   Respiratory:  Positive for cough, chest tightness and shortness of breath.   Cardiovascular:  Positive for chest pain and leg swelling.  Gastrointestinal: Negative.   Endocrine: Negative.   Genitourinary: Negative.   Musculoskeletal: Negative.   Neurological:  Negative for dizziness and syncope.  Hematological: Negative.   Psychiatric/Behavioral:  The patient is nervous/anxious.     BP (!) 154/90   Pulse 88   Resp 20   Ht '5\' 2"'$  (1.575 m)   Wt 180 lb (81.6 kg)   SpO2 98% Comment: RA  BMI 32.92 kg/m  Physical Exam Constitutional:      Appearance: Normal appearance.  HENT:     Head: Normocephalic and atraumatic.  Eyes:     Extraocular Movements: Extraocular movements intact.     Conjunctiva/sclera: Conjunctivae normal.     Pupils: Pupils are equal, round, and reactive to light.  Cardiovascular:     Rate and Rhythm: Normal rate and regular rhythm.     Pulses: Normal pulses.     Heart sounds: Normal heart sounds. No murmur heard. Pulmonary:     Effort: Pulmonary effort is normal.     Breath sounds: Normal  breath sounds.  Abdominal:     General: Abdomen is flat.     Palpations: Abdomen is soft.     Tenderness: There is no abdominal tenderness.  Musculoskeletal:        General: No swelling.  Skin:    General: Skin is warm and dry.  Neurological:     General: No focal deficit present.     Mental Status: She is alert and oriented to person, place, and time.  Psychiatric:        Mood and Affect: Mood normal.        Behavior: Behavior normal.      Diagnostic Tests:    ECHOCARDIOGRAM REPORT       Patient Name:   [name] Date of Exam: 07/20/2022  Medical Rec #:  [id number]         Height:       62.8 in  Accession #:    [id number]        Weight:       176.8 lb  Date of Birth:  01/09/1971         BSA:          1.830 m  Patient Age:    26 years          BP:           130/94 mmHg  Patient Gender: F                 HR:           108 bpm.  Exam Location:  [facility]   Procedure: 2D Echo, 3D Echo, Cardiac Doppler, Color Doppler and Strain  Analysis   Indications:   R00.2 Palpitations    History:        Patient has prior history of Echocardiogram examinations,  most                 recent 09/28/2019. Risk Factors:Current Smoker. Edema.    Sonographer:    Wilford Sports Rodgers-Jones RDCS  Referring Phys:  [place] Left ventricular ejection fraction, by estimation, is 35 to 40%. The  left ventricle has moderately decreased function. The left ventricle  demonstrates regional wall motion abnormalities (see scoring  diagram/findings for description). The left  ventricular internal cavity size was mildly dilated. Left ventricular  diastolic parameters are indeterminate. Mild global hypokinesis with  moderate hypokinesis of septal wall.   2. Right ventricular systolic function is normal. The right ventricular  size is normal.   3. Left atrial size was moderately dilated.   4. The mitral valve is normal in structure. Mild to moderate mitral valve   regurgitation. No evidence of mitral stenosis.   5. The aortic valve is grossly normal. There is mild calcification of the  aortic valve. Aortic valve regurgitation is not visualized. Aortic valve  sclerosis is present, with no evidence of aortic valve stenosis.   6. The inferior vena cava is normal in size with greater than 50%  respiratory variability, suggesting right atrial pressure of 3 mmHg.   7. Cannot exclude a small PFO.   Comparison(s): Prior images reviewed side by side. Changes from prior  study are noted. Prior images with normal EF, maybe minimal abnormal  septal motion. EF reduced and septal motion worse compared to prior.   Conclusion(s)/Recommendation(s): Significantly changed compared to prior,  will contact Nicholes Rough and Dr. Angelena Form.   FINDINGS   Left Ventricle: Left ventricular ejection fraction, by estimation, is 35  to 40%. The left ventricle has moderately decreased function. The left  ventricle demonstrates regional wall motion abnormalities. Moderate  hypokinesis of the left ventricular,  entire anteroseptal wall and inferoseptal wall. Global longitudinal strain  performed but not reported based on interpreter judgement due to  suboptimal tracking. 3D left ventricular ejection fraction analysis  performed but not reported based on  interpreter judgement due to suboptimal tracking. The left ventricular  internal cavity size was mildly dilated. There is no left ventricular  hypertrophy. Left ventricular diastolic parameters are indeterminate.     LV Wall Scoring:  Mild global hypokinesis with moderate hypokinesis of septal wall.   Right Ventricle: The right ventricular size is normal. No increase in  right ventricular wall thickness. Right ventricular systolic function is  normal.   Left Atrium: Left atrial size was moderately dilated.   Right Atrium: Right atrial size was normal in size.   Pericardium: There is no evidence of pericardial effusion.    Mitral Valve: The mitral valve is normal in structure. There is mild  thickening of the mitral valve leaflet(s). Mild to moderate mitral valve  regurgitation. No evidence of mitral valve stenosis.   Tricuspid Valve: The tricuspid valve is normal in structure. Tricuspid  valve regurgitation is trivial. No evidence of tricuspid stenosis.   Aortic Valve: The aortic valve is grossly normal. There is mild  calcification of the aortic valve. Aortic valve regurgitation is not  visualized. Aortic valve sclerosis is present, with no evidence of aortic  valve stenosis.   Pulmonic Valve: The pulmonic valve was grossly normal. Pulmonic valve  regurgitation is trivial. No evidence of pulmonic stenosis.   Aorta: The aortic root, ascending aorta, aortic arch and descending aorta  are all structurally normal, with no evidence of dilitation or  obstruction.   Venous: The inferior vena cava is normal in size with greater than 50%  respiratory variability, suggesting right atrial pressure of 3 mmHg.   IAS/Shunts: Cannot exclude a small PFO.  LEFT VENTRICLE  PLAX 2D  LVIDd:         5.60 cm   Diastology  LVIDs:         4.40 cm   LV e' medial:    12.50 cm/s  LV PW:         1.00 cm   LV E/e' medial:  10.2  LV IVS:        0.90 cm   LV e' lateral:   15.60 cm/s  LVOT diam:     2.20 cm   LV E/e' lateral: 8.2  LV SV:         65  LV SV Index:   35        2D Longitudinal Strain  LVOT Area:     3.80 cm  2D Strain GLS (A2C):   -19.1 %                           2D Strain GLS (A3C):   -15.6 %                           2D Strain GLS (A4C):   -17.8 %                           2D Strain GLS Avg:     -17.5 %                             3D Volume EF:                           3D EF:        44 %                           LV EDV:       211 ml                           LV ESV:       119 ml                           LV SV:        92 ml   RIGHT VENTRICLE  RV Basal diam:  3.50 cm  RV S prime:     16.40  cm/s  TAPSE (M-mode): 2.6 cm   LEFT ATRIUM             Index        RIGHT ATRIUM           Index  LA diam:        4.80 cm 2.62 cm/m   RA Area:     11.20 cm  LA Vol (A2C):   73.2 ml 40.00 ml/m  RA Volume:   23.70 ml  12.95 ml/m  LA Vol (A4C):   67.7 ml 37.00 ml/m  LA Biplane Vol: 73.9 ml 40.38 ml/m   AORTIC VALVE  LVOT Vmax:   99.55 cm/s  LVOT Vmean:  69.300 cm/s  LVOT VTI:    0.170 m    AORTA  Ao Root diam: 3.00 cm  Ao Asc diam:  3.70 cm   MITRAL VALVE  MV  Area (PHT): 6.65 cm     SHUNTS  MV Decel Time: 114 msec     Systemic VTI:  0.17 m  MV E velocity: 128.00 cm/s  Systemic Diam: 2.20 cm  MV A velocity: 100.00 cm/s  MV E/A ratio:  1.28   Buford Dresser MD  Electronically signed by Buford Dresser MD  Signature Date/Time: 07/20/2022/3:34:09 PM        Final     Physicians  Panel Physicians Referring Physician Case Authorizing Physician  End, Harrell Gave, MD (Primary)     Procedures  INTRAVASCULAR PRESSURE WIRE/FFR STUDY  RIGHT/LEFT HEART CATH AND CORONARY ANGIOGRAPHY   Conclusion  Conclusions: Multivessel coronary artery disease, as detailed below.  Most significant disease involves the mid LAD, where sequential 90% and 70% lesions flank an aneurysmal segment where a large D2 branch arises from.  The LAD lesions are highly significant by RFR (RFR 0.46).  D2 also contains a 70% proximal stenosis.  LCx and RCA demonstrate non-obstructive coronary artery disease (RFR mid RCA 0.94). Upper normal to mildly elevated left heart filling pressures (LVEDP 17 mmHg, PCWP 13 mmHg). Normal right heart and pulmonary artery pressures (mean RA 6 mmHg, mean PAP 18 mmHg). Mildly reduced Fick cardiac output/index (CO 4.0 L/min, CI 2.2 L/min/m^2).   Recommendations: Given complex anatomy of LAD/D2 disease, recommend cardiac surgery consultation and HeartTeam discussion regarding optimal management strategy. Discontinue diltiazem and start metoprolol succinate 25 mg  daily in the setting of cardiomyophathy. Restart apixaban tomorrow morning if no evidence of bleeding/vascular complications at catheterization sites. Aggressive secondary prevention of coronary artery disease.  Consider escalation of statin therapy to high-intensity dosing at follow-up.   Nelva Bush, MD [facility]       Recommendations  Antiplatelet/Anticoag Recommend to resume Apixaban, at currently prescribed dose and frequency on 09/08/2022. Concurrent antiplatelet therapy not recommended.  Discharge Date In the absence of any other complications or medical issues, we expect the patient to be ready for discharge on 09/07/2022.   Indications  Abnormal cardiac CT angiography [R93.1 (ICD-10-CM)]  Cardiomyopathy, unspecified type ([place]) [I42.9 (ICD-10-CM)]   Clinical Presentation  CHF/Shock Congestive heart failure present. NYHA Class III. No shock present.   Procedural Details  Technical Details Indication: 52 y.o. year-old woman with history of paroxysmal atrial fibrillation, anxiety, fibromyalgia, GERD, arthritis, HLD, rheumatoid arthritis, tobacco abuse, presenting for evaluation of recently diagnosed cardiomyopathy and abnormal coronary CTA.  GFR: >60 ml/min  Procedure: The risks, benefits, complications, treatment options, and expected outcomes were discussed with the patient. The patient and/or family concurred with the proposed plan, giving informed consent. The right wrist and elbow were prepped and draped in a sterile fashion. 1% lidocaine was used for local anesthesia.  Ultrasound was used to evaluate the right basilic vein. It was patent.  A micropuncture needle was used to access the right basilic vein under ultrasound guidance. A 51F slender Glidesheath was inserted using modified Seldinger technique. Right heart catheterization was performed by advancing a 51F balloon-tipped catheter through the right heart chambers into the pulmonary capillary wedge position. Pressure  measurements and oxygen saturations were obtained.  Ultrasound was used to evaluate the right radial artery . It was patent.  A micropuncture needle was used to access the right radial artery under ultrasound guidance.  Using the modified Seldinger access technique, a 27F slender Glidesheath was placed in the right radial artery. 3 mg Verapamil was given through the sheath. Heparin 4,000 units were administered.  Selective coronary angiography was performed using a 51F  JL3.5 catheter to engage the left coronary artery and a 181F JR4 catheter to engage the right coronary artery. Left heart catheterization was performed using a 181F JR4 catheter. Left ventriculogram was not performed.  RFR of LAD: Heparin was used for anticoagulation. The left coronary artery was engaged with a 81F [id number] guide catheter and a Pressure-X wire advanced into the distal LAD.  RFR was obtained after administration of intracoronary nitroglycerin (RFR 0.46).  Wire was then retracted to the aneurysmal segment of the mid LAD and RFR repeated (RFR 0.65).  RFR in the proximal LAD was normal (RFR 0.96).  Final angiogram demonstrates stable appearance of the left coronary artery.  RFR of RCA: Heparin was used for anticoagulation.  The right coronary artery was engaged with a 181F JR4 guide catheter.  The Pressure-X was was advanced into the distal RCA and RFR obtained after administration of intracoronary nitroglycerin (RFR = 0.94).  Final angiogram demonstrates stable appearance of the right coronary artery.  At the end of the procedure, the radial artery sheath was removed and a TR band applied to achieve patent hemostasis. The basilic vein sheath was removed and hemostasis achieved with manual compression.  There were no immediate complications. The patient was taken to the recovery area in stable condition.   Estimated blood loss <50 mL.   During this procedure medications were administered to achieve and maintain moderate conscious  sedation while the patient's heart rate, blood pressure, and oxygen saturation were continuously monitored and I was present face-to-face 100% of this time.   Medications (Filter: Administrations occurring from 0727 to 0916 on 09/07/22)  important  Continuous medications are totaled by the amount administered until 09/07/22 0916.   Heparin (Porcine) in NaCl 1000-0.9 UT/500ML-% SOLN (mL)  Total volume: 1,000 mL Date/Time Rate/Dose/Volume Action   09/07/22 0733 500 mL Given   0733 500 mL Given   fentaNYL (SUBLIMAZE) injection (mcg)  Total dose: 50 mcg Date/Time Rate/Dose/Volume Action   09/07/22 0739 25 mcg Given   0817 25 mcg Given   midazolam (VERSED) injection (mg)  Total dose: 3 mg Date/Time Rate/Dose/Volume Action   09/07/22 0739 1 mg Given   0745 1 mg Given   0817 1 mg Given   lidocaine (PF) (XYLOCAINE) 1 % injection (mL)  Total volume: 4 mL Date/Time Rate/Dose/Volume Action   09/07/22 0752 2 mL Given   0759 2 mL Given   Radial Cocktail/Verapamil only (mL)  Total volume: 10 mL Date/Time Rate/Dose/Volume Action   09/07/22 0800 10 mL Given   heparin sodium (porcine) injection (Units)  Total dose: 10,000 Units Date/Time Rate/Dose/Volume Action   09/07/22 0803 4,000 Units Given   0816 4,000 Units Given   0828 2,000 Units Given   Radial Cocktail/Verapamil only (mL)  Total volume: 10 mL Date/Time Rate/Dose/Volume Action   09/07/22 0817 10 mL Given   nitroGLYCERIN 1 mg/10 mL (100 mcg/mL) - IR/CATH LAB (mcg)  Total dose: 400 mcg Date/Time Rate/Dose/Volume Action   09/07/22 0833 200 mcg Given   0848 200 mcg Given   iohexol (OMNIPAQUE) 350 MG/ML injection (mL)  Total volume: 120 mL Date/Time Rate/Dose/Volume Action   09/07/22 0900 120 mL Given    Sedation Time  Sedation Time Physician-1: 1 hour 14 minutes 4 seconds Contrast  Medication Name Total Dose  iohexol (OMNIPAQUE) 350 MG/ML injection 120 mL   Radiation/Fluoro  Fluoro time: 11.9 (min) DAP: [id number]  (mGycm2) Cumulative Air Kerma: 123XX123 (mGy) Complications  Complications documented before study signed (09/07/2022  9:31 AM)  No complications were associated with this study.  Documented by Kayren Eaves T - 09/07/2022  9:02 AM     Coronary Findings  Diagnostic Dominance: Right Left Main  Vessel is large.  Mid LM to Ost LAD lesion is 30% stenosed. The lesion is eccentric. The lesion is moderately calcified.    Left Anterior Descending  Vessel is large.  Prox LAD to Mid LAD lesion is 20% stenosed. The lesion is moderately calcified.  Mid LAD-1 lesion is 90% stenosed. The lesion is focal and eccentric. The lesion is moderately calcified.  Mid LAD-2 lesion is 70% stenosed. The lesion is focal.    First Diagonal Branch  Vessel is small in size.  1st Diag lesion is 50% stenosed.    Second Diagonal Branch  Vessel is large in size.  2nd Diag lesion is 70% stenosed. The lesion is moderately calcified.    Ramus Intermedius  Vessel is moderate in size.  Ramus lesion is 30% stenosed.    Left Circumflex  Vessel is moderate in size.  Ost Cx to Prox Cx lesion is 40% stenosed.    First Obtuse Marginal Branch  Vessel is moderate in size.    Right Coronary Artery  Vessel is large.  Ost RCA lesion is 20% stenosed.  Mid RCA lesion is 60% stenosed. The lesion is focal.    Right Posterior Descending Artery  Vessel is moderate in size.    Right Posterior Atrioventricular Artery  Vessel is large in size.    First Right Posterolateral Branch  Vessel is small in size.    Second Right Posterolateral Branch  Vessel is moderate in size.    Third Right Posterolateral Branch  Vessel is moderate in size.    Intervention   No interventions have been documented.   Right Heart  Right Heart Pressures RA (mean): 6 mmHg RV (S/EDP): 30/7 mmHg PA (S/D, mean): 26/14 (18) mmHg PCWP (mean): 13 mmHg  Ao sat: 93% PA sat: 61%  Fick CO: 4.0 L/min Fick CI: 2.2 L/min/m^2   Left  Heart  Left Ventricle LV end diastolic pressure is mildly elevated. LVEDP 17 mmHg.  Aortic Valve There is no aortic valve stenosis.   Coronary Diagrams  Diagnostic Dominance: Right  Intervention   Implants   No implant documentation for this case.   Syngo Images   Show images for CARDIAC CATHETERIZATION Images on Long Term Storage   Show images for Caeley, Mcclymonds to Procedure Log  Procedure Log    Hemo Data  Flowsheet Row Most Recent Value  Fick Cardiac Output 4.03 L/min  Fick Cardiac Output Index 2.2 (L/min)/BSA  RA A Wave 6 mmHg  RA V Wave 7 mmHg  RA Mean 5 mmHg  RV Systolic Pressure 28 mmHg  RV Diastolic Pressure 0 mmHg  RV EDP 7 mmHg  PA Systolic Pressure 26 mmHg  PA Diastolic Pressure 11 mmHg  PA Mean 18 mmHg  PW A Wave 12 mmHg  PW V Wave 11 mmHg  PW Mean 8 mmHg  AO Systolic Pressure [id number] mmHg  AO Diastolic Pressure 78 mmHg  AO Mean 123456 mmHg  LV Systolic Pressure [id number] mmHg  LV Diastolic Pressure 5 mmHg  LV EDP 17 mmHg  AOp Systolic Pressure [id number] mmHg  AOp Diastolic Pressure 74 mmHg  AOp Mean Pressure 123XX123 mmHg  LVp Systolic Pressure 0000000 mmHg  LVp Diastolic Pressure 4 mmHg  LVp EDP Pressure 15 mmHg  QP/QS 1  TPVR Index 8.19 HRUI  TSVR Index 47.76  HRUI  PVR SVR Ratio 0.1  TPVR/TSVR Ratio 0.17    Impression:  This 52 year old woman has severe multivessel coronary disease with high-grade LAD stenosis affecting a large second diagonal branch.  There is also an irregular 60% mid RCA stenosis in a large dominant vessel which has a negative FFR but looks hemodynamically significant.  I agree that coronary artery bypass graft surgery is the best treatment for resolution of her symptoms and to preserve myocardium.  She has had some episodes of tachycardia and was told at a fire station that she was atrial fibrillation previously.  All of her ECGs in EPIC shows sinus rhythm.  She has had a lot of problems with these tachypalpitations that have been very  symptomatic and is on Eliquis with the presumption that she may be having atrial fibrillation.  I think it would be worthwhile doing a pulmonary vein ablation to decrease the risk of atrial fibrillation as well as clipping her left atrial appendage.  I reviewed the cardiac catheterization and echo images with her and answered her questions.  I would plan to use a left internal mammary graft for the LAD and vein grafts for her diagonal and right coronary artery.  I do not think it would be a good idea to attempt bilateral internal mammary artery grafts in a relatively short, obese woman who is a heavy smoker.  Her radial artery pulses are relatively weak and her arms are short and I doubt that her radial arteries would be ideal grafts.  I discussed the operative procedure with the patient including alternatives, benefits and risks; including but not limited to bleeding, blood transfusion, infection, stroke, myocardial infarction, graft failure, heart block requiring a permanent pacemaker, organ dysfunction, and death.  I also discussed the importance of smoking cessation and preventing further progression of coronary disease in her own native arteries or bypass grafts.  Lorenz Coaster understands and agrees to proceed.    Plan:  She will call back to schedule coronary bypass graft surgery and pulmonary vein ablation with clipping of her left atrial appendage.  I spent 60 minutes performing this consultation and > 50% of this time was spent face to face counseling and coordinating the care of this patient's severe multivessel coronary disease.     Gaye Pollack, MD [facility] [phone]

## 2022-09-22 ENCOUNTER — Other Ambulatory Visit: Payer: Self-pay | Admitting: *Deleted

## 2022-09-22 ENCOUNTER — Other Ambulatory Visit: Payer: Self-pay

## 2022-09-22 ENCOUNTER — Encounter: Payer: Self-pay | Admitting: *Deleted

## 2022-09-22 DIAGNOSIS — I2510 Atherosclerotic heart disease of native coronary artery without angina pectoris: Secondary | ICD-10-CM

## 2022-09-22 MED ORDER — METOPROLOL SUCCINATE ER 25 MG PO TB24: 25.0000 mg | ORAL_TABLET | Freq: Every day | ORAL | 99 refills | Status: DC | PRN

## 2022-09-25 ENCOUNTER — Telehealth: Payer: Self-pay | Admitting: Cardiovascular Disease

## 2022-09-25 MED ORDER — METOPROLOL SUCCINATE ER 25 MG PO TB24: 25.0000 mg | ORAL_TABLET | Freq: Two times a day (BID) | ORAL | 3 refills | Status: DC

## 2022-09-25 NOTE — Telephone Encounter (Signed)
Burnell Blanks, MD  You1 minute ago (1:06 PM)    We can send in the Toprol for her. Thanks Flavia Shipper sent into pharmacy of choice as ordered.

## 2022-09-25 NOTE — Telephone Encounter (Signed)
Spoke with pt who is reported she spoke with Dr Angelena Form on the phone last week and he instructed her to take Metoprolol Succinate 25 mg twice a day and to d/c Diltiazem.  She is needing this RX filled ASAP today.  Advised I will need to verify these changes with Dr Angelena Form as I am unable to locate documentation regarding this change in her chart.  She would like RX sent into [facility].

## 2022-09-25 NOTE — Telephone Encounter (Signed)
Pt c/o medication issue:  1. Name of Medication: metoprolol succinate (TOPROL XL) 25 MG 24 hr tablet   2. How are you currently taking this medication (dosage and times per day)?    3. Are you having a reaction (difficulty breathing--STAT)? no  4. What is your medication issue? Patient states she is unable to get a refill on this prescription Pharmacy states its not time. She states the dosage has been change. Please advise

## 2022-10-05 NOTE — Pre-Procedure Instructions (Signed)
Surgical Instructions    Your procedure is scheduled on Thursday, October 08, 2022 at 7:30 AM.  Report to [facility] Main Entrance "A" at 5:30 A.M., then check in with the Admitting office.  Call this number if you have problems the morning of surgery:  [phone]   If you have any questions prior to your surgery date call [phone]: Open Monday-Friday 8am-4pm  *If you experience any cold or flu symptoms such as cough, fever, chills, shortness of breath, etc. between now and your scheduled surgery, please notify us.*    Remember:  Do not eat or drink after midnight the night before your surgery    Take these medicines the morning of surgery with A SIP OF WATER:  atorvastatin (LIPITOR)  isosorbide mononitrate (IMDUR)  metoprolol succinate (TOPROL-XL)  omeprazole (PRILOSEC)   IF NEEDED: albuterol (VENTOLIN HFA) - bring with you day of surgery. benzonatate (TESSALON)  diltiazem (CARDIZEM)  escitalopram (LEXAPRO)  gabapentin (NEURONTIN)  nitroGLYCERIN (NITROSTAT)    STOP taking your apixaban (ELIQUIS) 5 days prior to surgery. Your last dose should be on 10/02/22.   As of today, STOP taking any Aspirin (unless otherwise instructed by your surgeon) Aleve, Naproxen, Ibuprofen, Motrin, Advil, Goody's, BC's, all herbal medications, fish oil, and all vitamins.                     Do NOT Smoke (Tobacco/Vaping) for 24 hours prior to your procedure.  If you use a CPAP at night, you may bring your mask/headgear for your overnight stay.   Contacts, glasses, piercing's, hearing aid's, dentures or partials may not be worn into surgery, please bring cases for these belongings.    For patients admitted to the hospital, discharge time will be determined by your treatment team.   Patients discharged the day of surgery will not be allowed to drive home, and someone needs to stay with them for 24 hours.  SURGICAL WAITING ROOM VISITATION Patients having surgery or a procedure may have 2  support people in the waiting area. Visitors may stay in the waiting area during the procedure and switch out with other visitors if needed. Only 1 support person is allowed in the pre-op area with the patient AFTER the patient is prepped. This person cannot be switched out.  Children under the age of 44 must have an adult accompany them who is not the patient. If the patient needs to stay at the hospital during part of their recovery, the visitor guidelines for inpatient rooms apply.  Please refer to the [facility] website for the visitor guidelines for Inpatients (after your surgery is over and you are in a regular room).    Special instructions:   [place]- Preparing For Surgery  Before surgery, you can play an important role. Because skin is not sterile, your skin needs to be as free of germs as possible. You can reduce the number of germs on your skin by washing with CHG (chlorahexidine gluconate) Soap before surgery.  CHG is an antiseptic cleaner which kills germs and bonds with the skin to continue killing germs even after washing.    Oral Hygiene is also important to reduce your risk of infection.  Remember - BRUSH YOUR TEETH THE MORNING OF SURGERY WITH YOUR REGULAR TOOTHPASTE  Please do not use if you have an allergy to CHG or antibacterial soaps. If your skin becomes reddened/irritated stop using the CHG.  Do not shave (including legs and underarms) for at least 48 hours  prior to first CHG shower. It is OK to shave your face.  Please follow these instructions carefully.   Shower the NIGHT BEFORE SURGERY and the MORNING OF SURGERY  If you chose to wash your hair, wash your hair first as usual with your normal shampoo.  After you shampoo, rinse your hair and body thoroughly to remove the shampoo.  Use CHG Soap as you would any other liquid soap. You can apply CHG directly to the skin and wash gently with a scrungie or a clean washcloth.   Apply the CHG Soap to your body ONLY  FROM THE NECK DOWN (neck, arms, chest, abdomen, legs, and back).  Do not use on open wounds or open sores. Avoid contact with your eyes, ears, mouth and genitals (private parts). Wash Face and genitals (private parts)  with your normal soap.   Wash thoroughly, paying special attention to the area where your surgery will be performed.  Thoroughly rinse your body with warm water from the neck down.  DO NOT shower/wash with your normal soap after using and rinsing off the CHG Soap.  Pat yourself dry with a CLEAN TOWEL.  Wear CLEAN PAJAMAS to bed the night before surgery  Place CLEAN SHEETS on your bed the night before your surgery  DO NOT SLEEP WITH PETS.   Day of Surgery: Take a shower with CHG soap. Do not wear lotions, powders, perfumes/colognes, or deodorant. Do not wear jewelry or makeup Do not shave 48 hours prior to surgery. Do not wear nail polish, gel polish, artificial nails, or any other type of covering on natural nails (fingers and toes) If you have artificial nails or gel coating that need to be removed by a nail salon, please have this removed prior to surgery. Artificial nails or gel coating may interfere with anesthesia's ability to adequately monitor your vital signs. Wear Clean/Comfortable clothing the morning of surgery Do not bring valuables to the hospital.  [facility] is not responsible for any belongings or valuables. Remember to brush your teeth WITH YOUR REGULAR TOOTHPASTE.   Please read over the following fact sheets that you were given.  If you received a COVID test during your pre-op visit  it is requested that you wear a mask when out in public, stay away from anyone that may not be feeling well and notify your surgeon if you develop symptoms. If you have been in contact with anyone that has tested positive in the last 10 days please notify you surgeon.

## 2022-10-06 ENCOUNTER — Other Ambulatory Visit: Payer: Self-pay

## 2022-10-06 ENCOUNTER — Encounter (HOSPITAL_COMMUNITY): Payer: Self-pay

## 2022-10-06 ENCOUNTER — Ambulatory Visit (HOSPITAL_COMMUNITY)
Admission: RE | Admit: 2022-10-06 | Discharge: 2022-10-06 | Disposition: A | Discharge status: Home / Self Care | Payer: BC Managed Care – PPO | Source: Ambulatory Visit | Attending: Surgery | Admitting: Surgery

## 2022-10-06 ENCOUNTER — Encounter (HOSPITAL_COMMUNITY)
Admission: RE | Admit: 2022-10-06 | Discharge: 2022-10-06 | Disposition: A | Discharge status: Home / Self Care | Payer: BC Managed Care – PPO | Source: Ambulatory Visit | Attending: Surgery | Admitting: Surgery

## 2022-10-06 ENCOUNTER — Ambulatory Visit (HOSPITAL_BASED_OUTPATIENT_CLINIC_OR_DEPARTMENT_OTHER)
Admission: RE | Admit: 2022-10-06 | Discharge: 2022-10-06 | Disposition: A | Discharge status: Home / Self Care | Payer: BC Managed Care – PPO | Source: Ambulatory Visit | Attending: Surgery | Admitting: Surgery

## 2022-10-06 VITALS — BP 145/89 | HR 79 | Temp 98.2°F | Resp 18 | Ht 62.0 in | Wt 180.9 lb

## 2022-10-06 DIAGNOSIS — Z1152 Encounter for screening for COVID-19: Secondary | ICD-10-CM | POA: Insufficient documentation

## 2022-10-06 DIAGNOSIS — F17210 Nicotine dependence, cigarettes, uncomplicated: Secondary | ICD-10-CM | POA: Insufficient documentation

## 2022-10-06 DIAGNOSIS — Z01818 Encounter for other preprocedural examination: Secondary | ICD-10-CM | POA: Insufficient documentation

## 2022-10-06 DIAGNOSIS — R0609 Other forms of dyspnea: Secondary | ICD-10-CM | POA: Insufficient documentation

## 2022-10-06 DIAGNOSIS — E785 Hyperlipidemia, unspecified: Secondary | ICD-10-CM | POA: Insufficient documentation

## 2022-10-06 DIAGNOSIS — I2510 Atherosclerotic heart disease of native coronary artery without angina pectoris: Secondary | ICD-10-CM

## 2022-10-06 DIAGNOSIS — I10 Essential (primary) hypertension: Secondary | ICD-10-CM | POA: Insufficient documentation

## 2022-10-06 HISTORY — DX: Cardiac arrhythmia, unspecified: I49.9

## 2022-10-06 LAB — PULMONARY FUNCTION TEST
DL/VA % pred: 88 %
DL/VA: 3.86 ml/min/mmHg/L
DLCO unc % pred: 77 %
DLCO unc: 15.2 ml/min/mmHg
FEF 25-75 Post: 2.35 L/sec
FEF 25-75 Pre: 1.5 L/sec
FEF2575-%Change-Post: 56 %
FEF2575-%Pred-Post: 91 %
FEF2575-%Pred-Pre: 58 %
FEV1-%Change-Post: 39 %
FEV1-%Pred-Post: 90 %
FEV1-%Pred-Pre: 64 %
FEV1-Post: 2.34 L
FEV1-Pre: 1.68 L
FEV1FVC-%Change-Post: 26 %
FEV1FVC-%Pred-Pre: 88 %
FEV6-%Change-Post: 11 %
FEV6-%Pred-Post: 81 %
FEV6-%Pred-Pre: 73 %
FEV6-Post: 2.6 L
FEV6-Pre: 2.34 L
FEV6FVC-%Pred-Post: 102 %
FEV6FVC-%Pred-Pre: 102 %
FVC-%Change-Post: 10 %
FVC-%Pred-Post: 79 %
FVC-%Pred-Pre: 72 %
FVC-Post: 2.62 L
FVC-Pre: 2.38 L
Post FEV1/FVC ratio: 89 %
Post FEV6/FVC ratio: 100 %
Pre FEV1/FVC ratio: 71 %
Pre FEV6/FVC Ratio: 100 %
RV % pred: 69 %
RV: 1.22 L
TLC % pred: 81 %
TLC: 3.93 L

## 2022-10-06 LAB — URINALYSIS, ROUTINE W REFLEX MICROSCOPIC
Bacteria, UA: NONE SEEN
Bilirubin Urine: NEGATIVE
Glucose, UA: NEGATIVE mg/dL
Ketones, ur: NEGATIVE mg/dL
Leukocytes,Ua: NEGATIVE
Nitrite: NEGATIVE
Protein, ur: NEGATIVE mg/dL
Specific Gravity, Urine: 1.012 (ref 1.005–1.030)
pH: 5 (ref 5.0–8.0)

## 2022-10-06 LAB — COMPREHENSIVE METABOLIC PANEL
ALT: 15 U/L (ref 0–44)
AST: 18 U/L (ref 15–41)
Albumin: 3.7 g/dL (ref 3.5–5.0)
Alkaline Phosphatase: 90 U/L (ref 38–126)
Anion gap: 13 (ref 5–15)
BUN: 9 mg/dL (ref 6–20)
CO2: 22 mmol/L (ref 22–32)
Calcium: 9.3 mg/dL (ref 8.9–10.3)
Chloride: 102 mmol/L (ref 98–111)
Creatinine, Ser: 0.69 mg/dL (ref 0.44–1.00)
GFR, Estimated: >60 mL/min (ref >60)
Glucose, Bld: 97 mg/dL (ref 70–99)
Potassium: 4.1 mmol/L (ref 3.5–5.1)
Sodium: 137 mmol/L (ref 135–145)
Total Bilirubin: 0.4 mg/dL (ref 0.3–1.2)
Total Protein: 6.4 g/dL — ABNORMAL LOW (ref 6.5–8.1)

## 2022-10-06 LAB — BLOOD GAS, ARTERIAL
Acid-Base Excess: 0.2 mmol/L (ref 0.0–2.0)
Bicarbonate: 21 mmol/L (ref 20.0–28.0)
Drawn by: 6643
O2 Saturation: 100 %
Patient temperature: 37
pCO2 arterial: 24 mmHg — ABNORMAL LOW (ref 32–48)
pH, Arterial: 7.55 — ABNORMAL HIGH (ref 7.35–7.45)
pO2, Arterial: 167 mmHg — ABNORMAL HIGH (ref 83–108)

## 2022-10-06 LAB — CBC
HCT: 41.3 % (ref 36.0–46.0)
Hemoglobin: 13.6 g/dL (ref 12.0–15.0)
MCH: 30.9 pg (ref 26.0–34.0)
MCHC: 32.9 g/dL (ref 30.0–36.0)
MCV: 93.9 fL (ref 80.0–100.0)
Platelets: 238 10*3/uL (ref 150–400)
RBC: 4.4 MIL/uL (ref 3.87–5.11)
RDW: 13.6 % (ref 11.5–15.5)
WBC: 6.5 10*3/uL (ref 4.0–10.5)
nRBC: 0 % (ref 0.0–0.2)

## 2022-10-06 LAB — APTT: aPTT: 29 seconds (ref 24–36)

## 2022-10-06 LAB — VAS US DOPPLER PRE CABG
Left ABI: 1.1
Right ABI: 1.1

## 2022-10-06 LAB — PROTIME-INR
INR: 1.1 (ref 0.8–1.2)
Prothrombin Time: 14.3 seconds (ref 11.4–15.2)

## 2022-10-06 LAB — SURGICAL PCR SCREEN
MRSA, PCR: NEGATIVE
Staphylococcus aureus: NEGATIVE

## 2022-10-06 MED ORDER — ALBUTEROL SULFATE (2.5 MG/3ML) 0.083% IN NEBU
2.5000 mg | INHALATION_SOLUTION | Freq: Once | RESPIRATORY_TRACT | Status: AC
  Administered 2022-10-06: 2.5 mg via RESPIRATORY_TRACT

## 2022-10-06 NOTE — Progress Notes (Signed)
PCP - Dr. [name] Cardiologist - Dr. Lauree Chandler  PPM/ICD - Denies  Chest x-ray - 10/06/22 EKG - 10/06/22 Stress Test - 09/22/19 ECHO - 07/20/22 Cardiac Cath - 09/07/22  Sleep Study - Denies  Diabetes: Denies  Blood Thinner Instructions: Stop 5 days prior to surgery. Last dose was on 10/03/22 Aspirin Instructions: N/A  ERAS Protcol - No  COVID TEST- 10/06/22 in PAT   Anesthesia review: Yes, cardiac hx  Patient denies shortness of breath, fever, cough and chest pain at PAT appointment   All instructions explained to the patient, with a verbal understanding of the material. Patient agrees to go over the instructions while at home for a better understanding. Patient also instructed to self quarantine after being tested for COVID-19. The opportunity to ask questions was provided.

## 2022-10-06 NOTE — Progress Notes (Signed)
Med rec not complete. No answer from pharm tech during PAT appt. Gave pt pharmacy call center number.

## 2022-10-07 LAB — SARS CORONAVIRUS 2 (TAT 6-24 HRS): SARS Coronavirus 2: NEGATIVE

## 2022-10-07 LAB — HEMOGLOBIN A1C
Hgb A1c MFr Bld: 5.8 % — ABNORMAL HIGH (ref 4.8–5.6)
Mean Plasma Glucose: 120 mg/dL

## 2022-10-07 MED ORDER — TRANEXAMIC ACID 1000 MG/10ML IV SOLN
1.5000 mg/kg/h | INTRAVENOUS | Status: AC
  Administered 2022-10-08: 1.5 mg/kg/h via INTRAVENOUS
  Filled 2022-10-07: qty 25

## 2022-10-07 MED ORDER — CEFAZOLIN SODIUM-DEXTROSE 2-4 GM/100ML-% IV SOLN
2.0000 g | INTRAVENOUS | Status: AC
  Administered 2022-10-08: 2 g via INTRAVENOUS
  Filled 2022-10-07: qty 100

## 2022-10-07 MED ORDER — DEXMEDETOMIDINE HCL IN NACL 400 MCG/100ML IV SOLN
0.1000 ug/kg/h | INTRAVENOUS | Status: AC
  Administered 2022-10-08: .4 ug/kg/h via INTRAVENOUS
  Filled 2022-10-07: qty 100

## 2022-10-07 MED ORDER — TRANEXAMIC ACID (OHS) PUMP PRIME SOLUTION
2.0000 mg/kg | INTRAVENOUS | Status: DC
  Filled 2022-10-07 (×2): qty 1.64

## 2022-10-07 MED ORDER — INSULIN REGULAR(HUMAN) IN NACL 100-0.9 UT/100ML-% IV SOLN
INTRAVENOUS | Status: AC
  Administered 2022-10-08: .9 [IU]/h via INTRAVENOUS
  Filled 2022-10-07: qty 100

## 2022-10-07 MED ORDER — PLASMA-LYTE A IV SOLN
INTRAVENOUS | Status: DC
  Filled 2022-10-07: qty 2.5

## 2022-10-07 MED ORDER — NOREPINEPHRINE 4 MG/250ML-% IV SOLN
0.0000 ug/min | INTRAVENOUS | Status: DC
  Filled 2022-10-07: qty 250

## 2022-10-07 MED ORDER — VANCOMYCIN HCL 1250 MG/250ML IV SOLN
1250.0000 mg | INTRAVENOUS | Status: AC
  Administered 2022-10-08: 1250 mg via INTRAVENOUS
  Filled 2022-10-07: qty 250

## 2022-10-07 MED ORDER — MANNITOL 20 % IV SOLN
INTRAVENOUS | Status: DC
  Filled 2022-10-07: qty 13

## 2022-10-07 MED ORDER — HEPARIN 30,000 UNITS/1000 ML (OHS) CELLSAVER SOLUTION
Status: DC
  Filled 2022-10-07: qty 1000

## 2022-10-07 MED ORDER — MILRINONE LACTATE IN DEXTROSE 20-5 MG/100ML-% IV SOLN
0.3000 ug/kg/min | INTRAVENOUS | Status: DC
  Filled 2022-10-07: qty 100

## 2022-10-07 MED ORDER — POTASSIUM CHLORIDE 2 MEQ/ML IV SOLN
80.0000 meq | INTRAVENOUS | Status: DC
  Filled 2022-10-07: qty 40

## 2022-10-07 MED ORDER — EPINEPHRINE HCL 5 MG/250ML IV SOLN IN NS
0.0000 ug/min | INTRAVENOUS | Status: DC
  Filled 2022-10-07: qty 250

## 2022-10-07 MED ORDER — TRANEXAMIC ACID (OHS) BOLUS VIA INFUSION
15.0000 mg/kg | INTRAVENOUS | Status: AC
  Administered 2022-10-08: 1231.5 mg via INTRAVENOUS
  Filled 2022-10-07: qty 1232

## 2022-10-07 MED ORDER — PHENYLEPHRINE HCL-NACL 20-0.9 MG/250ML-% IV SOLN
30.0000 ug/min | INTRAVENOUS | Status: AC
  Administered 2022-10-08: 20 ug/min via INTRAVENOUS
  Filled 2022-10-07: qty 250

## 2022-10-07 MED ORDER — NITROGLYCERIN IN D5W 200-5 MCG/ML-% IV SOLN
2.0000 ug/min | INTRAVENOUS | Status: DC
  Filled 2022-10-07: qty 250

## 2022-10-07 NOTE — Anesthesia Preprocedure Evaluation (Addendum)
Anesthesia Evaluation  Patient identified by MRN, date of birth, ID band Patient awake    Reviewed: Allergy & Precautions, H&P , NPO status , Patient's Chart, lab work & pertinent test results  Airway Mallampati: II  TM Distance: >3 FB Neck ROM: Full    Dental no notable dental hx. (+) Teeth Intact, Dental Advisory Given   Pulmonary Current Smoker and Patient abstained from smoking.   Pulmonary exam normal breath sounds clear to auscultation       Cardiovascular Exercise Tolerance: Good hypertension, Pt. on medications and Pt. on home beta blockers + CAD   Rhythm:Regular Rate:Normal     Neuro/Psych   Anxiety     negative neurological ROS     GI/Hepatic Neg liver ROS,GERD  Medicated,,  Endo/Other  negative endocrine ROS    Renal/GU negative Renal ROS  negative genitourinary   Musculoskeletal  (+) Arthritis , Osteoarthritis,  Fibromyalgia -  Abdominal   Peds  Hematology  (+) Blood dyscrasia, anemia   Anesthesia Other Findings   Reproductive/Obstetrics negative OB ROS                             Anesthesia Physical Anesthesia Plan  ASA: 4  Anesthesia Plan: General   Post-op Pain Management: Tylenol PO (pre-op)*   Induction: Intravenous  PONV Risk Score and Plan: 3 and Midazolam, Ondansetron and Treatment may vary due to age or medical condition  Airway Management Planned: Oral ETT  Additional Equipment: Arterial line, CVP, PA Cath, TEE and Ultrasound Guidance Line Placement  Intra-op Plan:   Post-operative Plan: Post-operative intubation/ventilation  Informed Consent: I have reviewed the patients History and Physical, chart, labs and discussed the procedure including the risks, benefits and alternatives for the proposed anesthesia with the patient or authorized representative who has indicated his/her understanding and acceptance.     Dental advisory given  Plan Discussed  with: CRNA  Anesthesia Plan Comments:        Anesthesia Quick Evaluation

## 2022-10-07 NOTE — H&P (Signed)
[address]       [place] [address]             [phone]      Cardiothoracic Surgery Admission History and Physical   PCP is [name], MD Referring Provider is End, Harrell Gave, MD       Chief Complaint  Patient presents with   Coronary Artery Disease           HPI:   The patient is a 52 year old woman with a history of hyperlipidemia, hypertension, ongoing 1 pack/day smoking, fibromyalgia, rheumatoid arthritis, GERD, and anxiety who has had episodes of tachypalpitations and tachycardia for the past few years.  She said that she had an EKG done during one of these episodes when she went to the fire station and she was told that she was in atrial fibrillation.  She also reports a history of exertional substernal chest discomfort radiating to her left shoulder as well as exertional shortness of breath and fatigue.  Bending over to lift things up also brings on the symptoms.  A 2D echocardiogram on 07/20/2022 showed a moderately decreased left ventricular ejection fraction of 35 to 40% with mild global hypokinesis and moderate septal hypokinesis.  There was mild to moderate mitral regurgitation.  Her previous echocardiogram in March 2021 showed a left ventricular ejection fraction of 50 to 55% with trivial mitral regurgitation.  She underwent a calcium scoring CT on 07/31/2022 showing a coronary calcium score of 909 which was the 99th percentile.  CT FFR showed a flow-limiting stenosis in a large second diagonal branch.  Cardiac catheterization on 09/07/2022 showed sequential 90% and 70% lesions in the LAD flanking an aneurysmal segment where a large second diagonal branch arises from.  The second diagonal branch had about 70% proximal stenosis.  The right coronary artery has a segmental 60% concentric mid vessel stenosis.  The RFR in the mid RCA was 0.94.  The RFR in the LAD was 0.46.  Left heart filling pressures were mildly elevated with an LVEDP of 17 and pulm  capillary wedge of 13.  Right heart pressures were normal.   She continues to smoke 1 pack of cigarettes per day and has since she was a teenager.  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       Past Medical History:  Diagnosis Date   Anxiety     Arthralgia     Attention-deficit hyperactivity disorder, predominantly inattentive type     Back pain     Fear of flying     Fibromyalgia     Gastroesophageal reflux disease     HPV in female     Joint pain     Localized edema     Neck pain     OA (osteoarthritis)     Papillomavirus as the cause of diseases classified elsewhere     Primary insomnia     Pure hypercholesterolemia     Rheumatoid arthritis, seronegative, hand, unspecified laterality ([place])      patient feels it is back related, not a true arthritis   Spondylolisthesis of lumbosacral region     Tobacco dependence     Unspecified inflammatory spondylopathy, cervical region ([place])     Vitamin B12 deficiency anemia     Voice hoarseness             Past Surgical History:  Procedure Laterality Date   ABDOMINAL EXPOSURE N/A 06/28/2020  Procedure: ABDOMINAL EXPOSURE;  Surgeon: Marty Heck, MD;  Location: [facility] OR;  Service: Vascular;  Laterality: N/A;  anterior   ANTERIOR LUMBAR FUSION N/A 06/28/2020    Procedure: ANTERIOR LUMBAR INTERBODY FUSION LUMBAR FIVE-SACRAL ONE.;  Surgeon: Eustace Moore, MD;  Location: [facility];  Service: Neurosurgery;  Laterality: N/A;  anterior approach   BREAST ENHANCEMENT SURGERY       CARPAL TUNNEL RELEASE Left     CARPAL TUNNEL RELEASE Right 06/28/2020    Procedure: CARPAL TUNNEL RELEASE;  Surgeon: Eustace Moore, MD;  Location: [facility];  Service: Neurosurgery;  Laterality: Right;   CERVICAL SPINE SURGERY       CHOLECYSTECTOMY       FOOT SURGERY       INTRAVASCULAR PRESSURE WIRE/FFR STUDY N/A 09/07/2022    Procedure: INTRAVASCULAR PRESSURE WIRE/FFR STUDY;  Surgeon: Nelva Bush, MD;  Location: [facility];  Service:  Cardiovascular;  Laterality: N/A;   MANDIBLE SURGERY       RIGHT/LEFT HEART CATH AND CORONARY ANGIOGRAPHY N/A 09/07/2022    Procedure: RIGHT/LEFT HEART CATH AND CORONARY ANGIOGRAPHY;  Surgeon: Nelva Bush, MD;  Location: [facility];  Service: Cardiovascular;  Laterality: N/A;   TUBAL LIGATION        with ablation   TYMPANOSTOMY TUBE PLACEMENT               Family History  Problem Relation Age of Onset   Hypertension Father     Heart disease Father        Social History Social History         Tobacco Use   Smoking status: Every Day      Packs/day: 1.00      Types: Cigarettes   Smokeless tobacco: Never  Substance Use Topics   Alcohol use: Yes      Comment: occasional   Drug use: Never            Current Outpatient Medications  Medication Sig Dispense Refill   albuterol (VENTOLIN HFA) 108 (90 Base) MCG/ACT inhaler Inhale 2 puffs into the lungs every 4 (four) hours as needed for wheezing or shortness of breath.       amphetamine-dextroamphetamine (ADDERALL XR) 30 MG 24 hr capsule Take 60 mg by mouth daily.       apixaban (ELIQUIS) 5 MG TABS tablet Take 1 tablet (5 mg total) by mouth 2 (two) times daily. 180 tablet 3   aspirin-acetaminophen-caffeine (EXCEDRIN MIGRAINE) 250-250-65 MG tablet Take 2 tablets by mouth every 6 (six) hours as needed for headache.       atorvastatin (LIPITOR) 80 MG tablet Take 1 tablet (80 mg total) by mouth daily. 90 tablet 3   benzonatate (TESSALON) 100 MG capsule Take 100-200 mg by mouth as needed.       cholecalciferol (VITAMIN D3) 25 MCG (1000 UT) tablet Take 1,000 Units by mouth daily.       CVS VITAMIN B12 1000 MCG tablet Take 1,000 mcg by mouth daily.       diltiazem (CARDIZEM) 30 MG tablet Take 1 tablet (30 mg total) by mouth as needed. 60 tablet 1   escitalopram (LEXAPRO) 10 MG tablet Take 10 mg by mouth daily as needed (anxiety).       gabapentin (NEURONTIN) 100 MG capsule Take 100-600 mg by mouth 4 (four) times daily as needed  (pain).       gabapentin (NEURONTIN) 300 MG capsule Take 600 mg by mouth at bedtime.  isosorbide mononitrate (IMDUR) 30 MG 24 hr tablet Take 0.5 tablets (15 mg total) by mouth daily. 45 tablet 3   lisdexamfetamine (VYVANSE) 60 MG capsule TAKE 1 CAPSULE BY MOUTH EVERY DAY IN THE MORNING FOR 90 DAYS       metoprolol succinate (TOPROL XL) 25 MG 24 hr tablet Take 1 tablet (25 mg total) by mouth daily. 25 tablet PRN   nitroGLYCERIN (NITROSTAT) 0.4 MG SL tablet Place 1 tablet (0.4 mg total) under the tongue every 5 (five) minutes as needed for chest pain. 25 tablet 3   omeprazole (PRILOSEC) 20 MG capsule Take 20 mg by mouth daily.       podofilox (CONDYLOX) 0.5 % external solution Apply 1 application topically once a week.       zolpidem (AMBIEN) 10 MG tablet Take 10 mg by mouth at bedtime.        No current facility-administered medications for this visit.           Allergies  Allergen Reactions   Zoloft [Sertraline]        Altered mental state      Review of Systems  Constitutional:  Positive for activity change and fatigue.  HENT: Negative.    Eyes: Negative.   Respiratory:  Positive for cough, chest tightness and shortness of breath.   Cardiovascular:  Positive for chest pain and leg swelling.  Gastrointestinal: Negative.   Endocrine: Negative.   Genitourinary: Negative.   Musculoskeletal: Negative.   Neurological:  Negative for dizziness and syncope.  Hematological: Negative.   Psychiatric/Behavioral:  The patient is nervous/anxious.       BP (!) 154/90   Pulse 88   Resp 20   Ht 5\' 2"  (0000000 m)   Wt 180 lb (81.6 kg)   SpO2 98% Comment: RA  BMI 32.92 kg/m  Physical Exam Constitutional:      Appearance: Normal appearance.  HENT:     Head: Normocephalic and atraumatic.  Eyes:     Extraocular Movements: Extraocular movements intact.     Conjunctiva/sclera: Conjunctivae normal.     Pupils: Pupils are equal, round, and reactive to light.  Cardiovascular:     Rate  and Rhythm: Normal rate and regular rhythm.     Pulses: Normal pulses.     Heart sounds: Normal heart sounds. No murmur heard. Pulmonary:     Effort: Pulmonary effort is normal.     Breath sounds: Normal breath sounds.  Abdominal:     General: Abdomen is flat.     Palpations: Abdomen is soft.     Tenderness: There is no abdominal tenderness.  Musculoskeletal:        General: No swelling.  Skin:    General: Skin is warm and dry.  Neurological:     General: No focal deficit present.     Mental Status: She is alert and oriented to person, place, and time.  Psychiatric:        Mood and Affect: Mood normal.        Behavior: Behavior normal.          Diagnostic Tests:     ECHOCARDIOGRAM REPORT       Patient Name:   LEXINGTON ELDRED Date of Exam: 07/20/2022  Medical Rec #:  [id number]         Height:       62.8 in  Accession #:    [id number]        Weight:       176.8  lb  Date of Birth:  [birth year]         BSA:          1.830 m  Patient Age:    70 years          BP:           130/94 mmHg  Patient Gender: F                 HR:           108 bpm.  Exam Location:  [facility]   Procedure: 2D Echo, 3D Echo, Cardiac Doppler, Color Doppler and Strain  Analysis   Indications:   R00.2 Palpitations    History:        Patient has prior history of Echocardiogram examinations,  most                 recent 09/28/2019. Risk Factors:Current Smoker. Edema.    Sonographer:    Wilford Sports Rodgers-Jones RDCS  Referring Phys: [id number] TESSA N CONTE   IMPRESSIONS     1. Left ventricular ejection fraction, by estimation, is 35 to 40%. The  left ventricle has moderately decreased function. The left ventricle  demonstrates regional wall motion abnormalities (see scoring  diagram/findings for description). The left  ventricular internal cavity size was mildly dilated. Left ventricular  diastolic parameters are indeterminate. Mild global hypokinesis with  moderate hypokinesis of septal wall.   2.  Right ventricular systolic function is normal. The right ventricular  size is normal.   3. Left atrial size was moderately dilated.   4. The mitral valve is normal in structure. Mild to moderate mitral valve  regurgitation. No evidence of mitral stenosis.   5. The aortic valve is grossly normal. There is mild calcification of the  aortic valve. Aortic valve regurgitation is not visualized. Aortic valve  sclerosis is present, with no evidence of aortic valve stenosis.   6. The inferior vena cava is normal in size with greater than 50%  respiratory variability, suggesting right atrial pressure of 3 mmHg.   7. Cannot exclude a small PFO.   Comparison(s): Prior images reviewed side by side. Changes from prior  study are noted. Prior images with normal EF, maybe minimal abnormal  septal motion. EF reduced and septal motion worse compared to prior.   Conclusion(s)/Recommendation(s): Significantly changed compared to prior,  will contact Nicholes Rough and Dr. Angelena Form.   FINDINGS   Left Ventricle: Left ventricular ejection fraction, by estimation, is 35  to 40%. The left ventricle has moderately decreased function. The left  ventricle demonstrates regional wall motion abnormalities. Moderate  hypokinesis of the left ventricular,  entire anteroseptal wall and inferoseptal wall. Global longitudinal strain  performed but not reported based on interpreter judgement due to  suboptimal tracking. 3D left ventricular ejection fraction analysis  performed but not reported based on  interpreter judgement due to suboptimal tracking. The left ventricular  internal cavity size was mildly dilated. There is no left ventricular  hypertrophy. Left ventricular diastolic parameters are indeterminate.     LV Wall Scoring:  Mild global hypokinesis with moderate hypokinesis of septal wall.   Right Ventricle: The right ventricular size is normal. No increase in  right ventricular wall thickness. Right  ventricular systolic function is  normal.   Left Atrium: Left atrial size was moderately dilated.   Right Atrium: Right atrial size was normal in size.   Pericardium: There is no evidence of pericardial effusion.   Mitral  Valve: The mitral valve is normal in structure. There is mild  thickening of the mitral valve leaflet(s). Mild to moderate mitral valve  regurgitation. No evidence of mitral valve stenosis.   Tricuspid Valve: The tricuspid valve is normal in structure. Tricuspid  valve regurgitation is trivial. No evidence of tricuspid stenosis.   Aortic Valve: The aortic valve is grossly normal. There is mild  calcification of the aortic valve. Aortic valve regurgitation is not  visualized. Aortic valve sclerosis is present, with no evidence of aortic  valve stenosis.   Pulmonic Valve: The pulmonic valve was grossly normal. Pulmonic valve  regurgitation is trivial. No evidence of pulmonic stenosis.   Aorta: The aortic root, ascending aorta, aortic arch and descending aorta  are all structurally normal, with no evidence of dilitation or  obstruction.   Venous: The inferior vena cava is normal in size with greater than 50%  respiratory variability, suggesting right atrial pressure of 3 mmHg.   IAS/Shunts: Cannot exclude a small PFO.     LEFT VENTRICLE  PLAX 2D  LVIDd:         5.60 cm   Diastology  LVIDs:         4.40 cm   LV e' medial:    12.50 cm/s  LV PW:         1.00 cm   LV E/e' medial:  10.2  LV IVS:        0.90 cm   LV e' lateral:   15.60 cm/s  LVOT diam:     2.20 cm   LV E/e' lateral: 8.2  LV SV:         65  LV SV Index:   35        2D Longitudinal Strain  LVOT Area:     3.80 cm  2D Strain GLS (A2C):   -19.1 %                           2D Strain GLS (A3C):   -15.6 %                           2D Strain GLS (A4C):   -17.8 %                           2D Strain GLS Avg:     -17.5 %                             3D Volume EF:                           3D EF:         44 %                           LV EDV:       211 ml                           LV ESV:       119 ml                           LV SV:  92 ml   RIGHT VENTRICLE  RV Basal diam:  3.50 cm  RV S prime:     16.40 cm/s  TAPSE (M-mode): 2.6 cm   LEFT ATRIUM             Index        RIGHT ATRIUM           Index  LA diam:        4.80 cm 2.62 cm/m   RA Area:     11.20 cm  LA Vol (A2C):   73.2 ml 40.00 ml/m  RA Volume:   23.70 ml  12.95 ml/m  LA Vol (A4C):   67.7 ml 37.00 ml/m  LA Biplane Vol: 73.9 ml 40.38 ml/m   AORTIC VALVE  LVOT Vmax:   99.55 cm/s  LVOT Vmean:  69.300 cm/s  LVOT VTI:    0.170 m    AORTA  Ao Root diam: 3.00 cm  Ao Asc diam:  3.70 cm   MITRAL VALVE  MV Area (PHT): 6.65 cm     SHUNTS  MV Decel Time: 114 msec     Systemic VTI:  0.17 m  MV E velocity: 128.00 cm/s  Systemic Diam: 2.20 cm  MV A velocity: 100.00 cm/s  MV E/A ratio:  1.28   Buford Dresser MD  Electronically signed by Buford Dresser MD  Signature Date/Time: 07/20/2022/3:34:09 PM        Final      Physicians   Panel Physicians Referring Physician Case Authorizing Physician  End, Harrell Gave, MD (Primary)        Procedures   INTRAVASCULAR PRESSURE WIRE/FFR STUDY  RIGHT/LEFT HEART CATH AND CORONARY ANGIOGRAPHY    Conclusion   Conclusions: Multivessel coronary artery disease, as detailed below.  Most significant disease involves the mid LAD, where sequential 90% and 70% lesions flank an aneurysmal segment where a large D2 branch arises from.  The LAD lesions are highly significant by RFR (RFR 0.46).  D2 also contains a 70% proximal stenosis.  LCx and RCA demonstrate non-obstructive coronary artery disease (RFR mid RCA 0.94). Upper normal to mildly elevated left heart filling pressures (LVEDP 17 mmHg, PCWP 13 mmHg). Normal right heart and pulmonary artery pressures (mean RA 6 mmHg, mean PAP 18 mmHg). Mildly reduced Fick cardiac output/index (CO 4.0 L/min, CI 2.2 L/min/m^2).    Recommendations: Given complex anatomy of LAD/D2 disease, recommend cardiac surgery consultation and HeartTeam discussion regarding optimal management strategy. Discontinue diltiazem and start metoprolol succinate 25 mg daily in the setting of cardiomyophathy. Restart apixaban tomorrow morning if no evidence of bleeding/vascular complications at catheterization sites. Aggressive secondary prevention of coronary artery disease.  Consider escalation of statin therapy to high-intensity dosing at follow-up.   Nelva Bush, MD [facility]       Recommendations       Antiplatelet/Anticoag Recommend to resume Apixaban, at currently prescribed dose and frequency on 09/08/2022. Concurrent antiplatelet therapy not recommended.  Discharge Date In the absence of any other complications or medical issues, we expect the patient to be ready for discharge on 09/07/2022.    Indications   Abnormal cardiac CT angiography [R93.1 (ICD-10-CM)]  Cardiomyopathy, unspecified type ([place]) [I42.9 (ICD-10-CM)]    Clinical Presentation   CHF/Shock Congestive heart failure present. NYHA Class III. No shock present.    Procedural Details   Technical Details Indication: 52 y.o. year-old woman with history of paroxysmal atrial fibrillation, anxiety, fibromyalgia, GERD, arthritis, HLD, rheumatoid arthritis, tobacco abuse, presenting for evaluation of recently diagnosed  cardiomyopathy and abnormal coronary CTA.  GFR: >60 ml/min  Procedure: The risks, benefits, complications, treatment options, and expected outcomes were discussed with the patient. The patient and/or family concurred with the proposed plan, giving informed consent. The right wrist and elbow were prepped and draped in a sterile fashion. 1% lidocaine was used for local anesthesia.  Ultrasound was used to evaluate the right basilic vein. It was patent.  A micropuncture needle was used to access the right basilic vein under ultrasound guidance. A 149F  slender Glidesheath was inserted using modified Seldinger technique. Right heart catheterization was performed by advancing a 149F balloon-tipped catheter through the right heart chambers into the pulmonary capillary wedge position. Pressure measurements and oxygen saturations were obtained.  Ultrasound was used to evaluate the right radial artery . It was patent.  A micropuncture needle was used to access the right radial artery under ultrasound guidance.  Using the modified Seldinger access technique, a 49F slender Glidesheath was placed in the right radial artery. 3 mg Verapamil was given through the sheath. Heparin 4,000 units were administered.  Selective coronary angiography was performed using a 149F JL3.5 catheter to engage the left coronary artery and a 149F JR4 catheter to engage the right coronary artery. Left heart catheterization was performed using a 149F JR4 catheter. Left ventriculogram was not performed.  RFR of LAD: Heparin was used for anticoagulation. The left coronary artery was engaged with a 49F [id number] guide catheter and a Pressure-X wire advanced into the distal LAD.  RFR was obtained after administration of intracoronary nitroglycerin (RFR 0.46).  Wire was then retracted to the aneurysmal segment of the mid LAD and RFR repeated (RFR 0.65).  RFR in the proximal LAD was normal (RFR 0.96).  Final angiogram demonstrates stable appearance of the left coronary artery.  RFR of RCA: Heparin was used for anticoagulation.  The right coronary artery was engaged with a 149F JR4 guide catheter.  The Pressure-X was was advanced into the distal RCA and RFR obtained after administration of intracoronary nitroglycerin (RFR = 0.94).  Final angiogram demonstrates stable appearance of the right coronary artery.  At the end of the procedure, the radial artery sheath was removed and a TR band applied to achieve patent hemostasis. The basilic vein sheath was removed and hemostasis achieved with manual compression.   There were no immediate complications. The patient was taken to the recovery area in stable condition.   Estimated blood loss <50 mL.   During this procedure medications were administered to achieve and maintain moderate conscious sedation while the patient's heart rate, blood pressure, and oxygen saturation were continuously monitored and I was present face-to-face 100% of this time.    Medications (Filter: Administrations occurring from 0727 to 0916 on 09/07/22)  important  Continuous medications are totaled by the amount administered until 09/07/22 0916.    Heparin (Porcine) in NaCl 1000-0.9 UT/500ML-% SOLN (mL)  Total volume: 1,000 mL Date/Time Rate/Dose/Volume Action    09/07/22 0733 500 mL Given    0733 500 mL Given    fentaNYL (SUBLIMAZE) injection (mcg)  Total dose: 50 mcg Date/Time Rate/Dose/Volume Action    09/07/22 0739 25 mcg Given    0817 25 mcg Given    midazolam (VERSED) injection (mg)  Total dose: 3 mg Date/Time Rate/Dose/Volume Action    09/07/22 0739 1 mg Given    0745 1 mg Given    0817 1 mg Given    lidocaine (PF) (XYLOCAINE) 1 % injection (mL)  Total volume: 4  mL Date/Time Rate/Dose/Volume Action    09/07/22 0752 2 mL Given    0759 2 mL Given    Radial Cocktail/Verapamil only (mL)  Total volume: 10 mL Date/Time Rate/Dose/Volume Action    09/07/22 0800 10 mL Given    heparin sodium (porcine) injection (Units)  Total dose: 10,000 Units Date/Time Rate/Dose/Volume Action    09/07/22 0803 4,000 Units Given    0816 4,000 Units Given    0828 2,000 Units Given    Radial Cocktail/Verapamil only (mL)  Total volume: 10 mL Date/Time Rate/Dose/Volume Action    09/07/22 0817 10 mL Given    nitroGLYCERIN 1 mg/10 mL (100 mcg/mL) - IR/CATH LAB (mcg)  Total dose: 400 mcg Date/Time Rate/Dose/Volume Action    09/07/22 0833 200 mcg Given    0848 200 mcg Given    iohexol (OMNIPAQUE) 350 MG/ML injection (mL)  Total volume: 120 mL Date/Time Rate/Dose/Volume Action     09/07/22 0900 120 mL Given      Sedation Time   Sedation Time Physician-1: 1 hour 14 minutes 4 seconds Contrast   Medication Name Total Dose  iohexol (OMNIPAQUE) 350 MG/ML injection 120 mL    Radiation/Fluoro   Fluoro time: 11.9 (min) DAP: [id number] (mGycm2) Cumulative Air Kerma: [id number] (mGy) Complications   Complications documented before study signed (09/07/2022  0000000 AM)    No complications were associated with this study.  Documented by Kayren Eaves T - 09/07/2022  9:02 AM      Coronary Findings   Diagnostic Dominance: Right Left Main  Vessel is large.  Mid LM to Ost LAD lesion is 30% stenosed. The lesion is eccentric. The lesion is moderately calcified.    Left Anterior Descending  Vessel is large.  Prox LAD to Mid LAD lesion is 20% stenosed. The lesion is moderately calcified.  Mid LAD-1 lesion is 90% stenosed. The lesion is focal and eccentric. The lesion is moderately calcified.  Mid LAD-2 lesion is 70% stenosed. The lesion is focal.    First Diagonal Branch  Vessel is small in size.  1st Diag lesion is 50% stenosed.    Second Diagonal Branch  Vessel is large in size.  2nd Diag lesion is 70% stenosed. The lesion is moderately calcified.    Ramus Intermedius  Vessel is moderate in size.  Ramus lesion is 30% stenosed.    Left Circumflex  Vessel is moderate in size.  Ost Cx to Prox Cx lesion is 40% stenosed.    First Obtuse Marginal Branch  Vessel is moderate in size.    Right Coronary Artery  Vessel is large.  Ost RCA lesion is 20% stenosed.  Mid RCA lesion is 60% stenosed. The lesion is focal.    Right Posterior Descending Artery  Vessel is moderate in size.    Right Posterior Atrioventricular Artery  Vessel is large in size.    First Right Posterolateral Branch  Vessel is small in size.    Second Right Posterolateral Branch  Vessel is moderate in size.    Third Right Posterolateral Branch  Vessel is moderate in size.    Intervention     No interventions have been documented.    Right Heart   Right Heart Pressures RA (mean): 6 mmHg RV (S/EDP): 30/7 mmHg PA (S/D, mean): 26/14 (18) mmHg PCWP (mean): 13 mmHg  Ao sat: 93% PA sat: 61%  Fick CO: 4.0 L/min Fick CI: 2.2 L/min/m^2    Left Heart   Left Ventricle LV end diastolic pressure is mildly elevated.  LVEDP 17 mmHg.  Aortic Valve There is no aortic valve stenosis.    Coronary Diagrams   Diagnostic Dominance: Right  Intervention    Implants    No implant documentation for this case.    Syngo Images    Show images for CARDIAC CATHETERIZATION Images on Long Term Storage    Show images for Breck, Chrisley to Procedure Log   Procedure Log    Hemo Data   Flowsheet Row Most Recent Value  Fick Cardiac Output 4.03 L/min  Fick Cardiac Output Index 2.2 (L/min)/BSA  RA A Wave 6 mmHg  RA V Wave 7 mmHg  RA Mean 5 mmHg  RV Systolic Pressure 28 mmHg  RV Diastolic Pressure 0 mmHg  RV EDP 7 mmHg  PA Systolic Pressure 26 mmHg  PA Diastolic Pressure 11 mmHg  PA Mean 18 mmHg  PW A Wave 12 mmHg  PW V Wave 11 mmHg  PW Mean 8 mmHg  AO Systolic Pressure [id number] mmHg  AO Diastolic Pressure 78 mmHg  AO Mean 123456 mmHg  LV Systolic Pressure [id number] mmHg  LV Diastolic Pressure 5 mmHg  LV EDP 17 mmHg  AOp Systolic Pressure XX123456 mmHg  AOp Diastolic Pressure 74 mmHg  AOp Mean Pressure 123XX123 mmHg  LVp Systolic Pressure 0000000 mmHg  LVp Diastolic Pressure 4 mmHg  LVp EDP Pressure 15 mmHg  QP/QS 1  TPVR Index 8.19 HRUI  TSVR Index 47.76 HRUI  PVR SVR Ratio 0.1  TPVR/TSVR Ratio 0.17      Impression:   This 52 year old woman has severe multivessel coronary disease with high-grade LAD stenosis affecting a large second diagonal branch.  There is also an irregular 60% mid RCA stenosis in a large dominant vessel which has a negative FFR but looks hemodynamically significant.  I agree that coronary artery bypass graft surgery is the best treatment for resolution of her  symptoms and to preserve myocardium.  She has had some episodes of tachycardia and was told at a fire station that she was atrial fibrillation previously.  All of her ECGs in EPIC shows sinus rhythm.  She has had a lot of problems with these tachypalpitations that have been very symptomatic and is on Eliquis with the presumption that she may be having atrial fibrillation.  I think it would be worthwhile doing a pulmonary vein ablation to decrease the risk of atrial fibrillation as well as clipping her left atrial appendage.  I reviewed the cardiac catheterization and echo images with her and answered her questions.  I would plan to use a left internal mammary graft for the LAD and vein grafts for her diagonal and right coronary artery.  I do not think it would be a good idea to attempt bilateral internal mammary artery grafts in a relatively short, obese woman who is a heavy smoker.  Her radial artery pulses are relatively weak and her arms are short and I doubt that her radial arteries would be ideal grafts.   I discussed the operative procedure with the patient including alternatives, benefits and risks; including but not limited to bleeding, blood transfusion, infection, stroke, myocardial infarction, graft failure, heart block requiring a permanent pacemaker, organ dysfunction, and death.  I also discussed the importance of smoking cessation and preventing further progression of coronary disease in her own native arteries or bypass grafts.  Lorenz Coaster understands and agrees to proceed.     Plan:   Coronary bypass graft surgery and pulmonary vein ablation with clipping of  her left atrial appendage.        Gaye Pollack, MD [facility] [phone]

## 2022-10-08 ENCOUNTER — Inpatient Hospital Stay (HOSPITAL_COMMUNITY): Payer: BC Managed Care – PPO | Admitting: Physician Assistant

## 2022-10-08 ENCOUNTER — Other Ambulatory Visit: Payer: Self-pay

## 2022-10-08 ENCOUNTER — Inpatient Hospital Stay (HOSPITAL_COMMUNITY)
Admission: RE | Admit: 2022-10-08 | Discharge: 2022-10-15 | DRG: 236 | Disposition: A | Discharge status: Home Health | Payer: BC Managed Care – PPO | Attending: Surgery | Admitting: Surgery

## 2022-10-08 ENCOUNTER — Encounter (HOSPITAL_COMMUNITY): Payer: Self-pay | Admitting: Surgery

## 2022-10-08 ENCOUNTER — Inpatient Hospital Stay (HOSPITAL_COMMUNITY): Payer: BC Managed Care – PPO | Admitting: Certified Registered"

## 2022-10-08 ENCOUNTER — Inpatient Hospital Stay (HOSPITAL_COMMUNITY)
Admission: RE | Disposition: A | Discharge status: Home / Self Care | Payer: Self-pay | Source: Home / Self Care | Attending: Surgery

## 2022-10-08 ENCOUNTER — Inpatient Hospital Stay (HOSPITAL_COMMUNITY): Payer: BC Managed Care – PPO

## 2022-10-08 DIAGNOSIS — I4891 Unspecified atrial fibrillation: Secondary | ICD-10-CM | POA: Diagnosis present

## 2022-10-08 DIAGNOSIS — Z888 Allergy status to other drugs, medicaments and biological substances status: Secondary | ICD-10-CM | POA: Diagnosis not present

## 2022-10-08 DIAGNOSIS — M797 Fibromyalgia: Secondary | ICD-10-CM | POA: Diagnosis present

## 2022-10-08 DIAGNOSIS — I509 Heart failure, unspecified: Secondary | ICD-10-CM | POA: Diagnosis not present

## 2022-10-08 DIAGNOSIS — Z981 Arthrodesis status: Secondary | ICD-10-CM

## 2022-10-08 DIAGNOSIS — D62 Acute posthemorrhagic anemia: Secondary | ICD-10-CM | POA: Diagnosis not present

## 2022-10-08 DIAGNOSIS — I110 Hypertensive heart disease with heart failure: Secondary | ICD-10-CM | POA: Diagnosis present

## 2022-10-08 DIAGNOSIS — Z01818 Encounter for other preprocedural examination: Secondary | ICD-10-CM | POA: Diagnosis not present

## 2022-10-08 DIAGNOSIS — R000 Tachycardia, unspecified: Secondary | ICD-10-CM | POA: Diagnosis not present

## 2022-10-08 DIAGNOSIS — J9811 Atelectasis: Secondary | ICD-10-CM | POA: Diagnosis not present

## 2022-10-08 DIAGNOSIS — Z8249 Family history of ischemic heart disease and other diseases of the circulatory system: Secondary | ICD-10-CM

## 2022-10-08 DIAGNOSIS — F17210 Nicotine dependence, cigarettes, uncomplicated: Secondary | ICD-10-CM | POA: Diagnosis present

## 2022-10-08 DIAGNOSIS — R0902 Hypoxemia: Secondary | ICD-10-CM | POA: Diagnosis not present

## 2022-10-08 DIAGNOSIS — F900 Attention-deficit hyperactivity disorder, predominantly inattentive type: Secondary | ICD-10-CM | POA: Diagnosis present

## 2022-10-08 DIAGNOSIS — Z1152 Encounter for screening for COVID-19: Secondary | ICD-10-CM

## 2022-10-08 DIAGNOSIS — Z9049 Acquired absence of other specified parts of digestive tract: Secondary | ICD-10-CM

## 2022-10-08 DIAGNOSIS — Z951 Presence of aortocoronary bypass graft: Secondary | ICD-10-CM | POA: Diagnosis not present

## 2022-10-08 DIAGNOSIS — Z7901 Long term (current) use of anticoagulants: Secondary | ICD-10-CM

## 2022-10-08 DIAGNOSIS — F32A Depression, unspecified: Secondary | ICD-10-CM | POA: Diagnosis not present

## 2022-10-08 DIAGNOSIS — I2510 Atherosclerotic heart disease of native coronary artery without angina pectoris: Secondary | ICD-10-CM

## 2022-10-08 DIAGNOSIS — K219 Gastro-esophageal reflux disease without esophagitis: Secondary | ICD-10-CM | POA: Diagnosis present

## 2022-10-08 DIAGNOSIS — Z79899 Other long term (current) drug therapy: Secondary | ICD-10-CM

## 2022-10-08 DIAGNOSIS — I081 Rheumatic disorders of both mitral and tricuspid valves: Secondary | ICD-10-CM | POA: Diagnosis present

## 2022-10-08 DIAGNOSIS — M069 Rheumatoid arthritis, unspecified: Secondary | ICD-10-CM | POA: Diagnosis not present

## 2022-10-08 DIAGNOSIS — E7800 Pure hypercholesterolemia, unspecified: Secondary | ICD-10-CM | POA: Diagnosis present

## 2022-10-08 HISTORY — DX: Unspecified atrial fibrillation: I48.91

## 2022-10-08 HISTORY — DX: Unspecified asthma, uncomplicated: J45.909

## 2022-10-08 HISTORY — PX: MAZE: SHX5063

## 2022-10-08 HISTORY — PX: TEE WITHOUT CARDIOVERSION: SHX5443

## 2022-10-08 HISTORY — PX: CLIPPING OF ATRIAL APPENDAGE: SHX5773

## 2022-10-08 HISTORY — PX: CORONARY ARTERY BYPASS GRAFT: SHX141

## 2022-10-08 LAB — POCT I-STAT, CHEM 8
BUN: 7 mg/dL (ref 6–20)
BUN: 7 mg/dL (ref 6–20)
BUN: 7 mg/dL (ref 6–20)
BUN: 6 mg/dL (ref 6–20)
BUN: 6 mg/dL (ref 6–20)
BUN: 6 mg/dL (ref 6–20)
Calcium, Ion: 1.26 mmol/L (ref 1.15–1.40)
Calcium, Ion: 1.19 mmol/L (ref 1.15–1.40)
Calcium, Ion: 1.02 mmol/L — ABNORMAL LOW (ref 1.15–1.40)
Calcium, Ion: 1.01 mmol/L — ABNORMAL LOW (ref 1.15–1.40)
Calcium, Ion: 1.08 mmol/L — ABNORMAL LOW (ref 1.15–1.40)
Calcium, Ion: 1.08 mmol/L — ABNORMAL LOW (ref 1.15–1.40)
Chloride: 104 mmol/L (ref 98–111)
Chloride: 104 mmol/L (ref 98–111)
Chloride: 105 mmol/L (ref 98–111)
Chloride: 103 mmol/L (ref 98–111)
Chloride: 105 mmol/L (ref 98–111)
Chloride: 104 mmol/L (ref 98–111)
Creatinine, Ser: 0.5 mg/dL (ref 0.44–1.00)
Creatinine, Ser: 0.6 mg/dL (ref 0.44–1.00)
Creatinine, Ser: 0.4 mg/dL — ABNORMAL LOW (ref 0.44–1.00)
Creatinine, Ser: 0.4 mg/dL — ABNORMAL LOW (ref 0.44–1.00)
Creatinine, Ser: 0.5 mg/dL (ref 0.44–1.00)
Creatinine, Ser: 0.5 mg/dL (ref 0.44–1.00)
Glucose, Bld: 111 mg/dL — ABNORMAL HIGH (ref 70–99)
Glucose, Bld: 100 mg/dL — ABNORMAL HIGH (ref 70–99)
Glucose, Bld: 105 mg/dL — ABNORMAL HIGH (ref 70–99)
Glucose, Bld: 129 mg/dL — ABNORMAL HIGH (ref 70–99)
Glucose, Bld: 122 mg/dL — ABNORMAL HIGH (ref 70–99)
Glucose, Bld: 113 mg/dL — ABNORMAL HIGH (ref 70–99)
HCT: 36 % (ref 36.0–46.0)
HCT: 34 % — ABNORMAL LOW (ref 36.0–46.0)
HCT: 24 % — ABNORMAL LOW (ref 36.0–46.0)
HCT: 26 % — ABNORMAL LOW (ref 36.0–46.0)
HCT: 26 % — ABNORMAL LOW (ref 36.0–46.0)
HCT: 24 % — ABNORMAL LOW (ref 36.0–46.0)
Hemoglobin: 12.2 g/dL (ref 12.0–15.0)
Hemoglobin: 11.6 g/dL — ABNORMAL LOW (ref 12.0–15.0)
Hemoglobin: 8.2 g/dL — ABNORMAL LOW (ref 12.0–15.0)
Hemoglobin: 8.8 g/dL — ABNORMAL LOW (ref 12.0–15.0)
Hemoglobin: 8.8 g/dL — ABNORMAL LOW (ref 12.0–15.0)
Hemoglobin: 8.2 g/dL — ABNORMAL LOW (ref 12.0–15.0)
Potassium: 3.7 mmol/L (ref 3.5–5.1)
Potassium: 3.8 mmol/L (ref 3.5–5.1)
Potassium: 7.2 mmol/L (ref 3.5–5.1)
Potassium: 4.5 mmol/L (ref 3.5–5.1)
Potassium: 4.5 mmol/L (ref 3.5–5.1)
Potassium: 4.1 mmol/L (ref 3.5–5.1)
Sodium: 142 mmol/L (ref 135–145)
Sodium: 141 mmol/L (ref 135–145)
Sodium: 136 mmol/L (ref 135–145)
Sodium: 139 mmol/L (ref 135–145)
Sodium: 140 mmol/L (ref 135–145)
Sodium: 141 mmol/L (ref 135–145)
TCO2: 29 mmol/L (ref 22–32)
TCO2: 26 mmol/L (ref 22–32)
TCO2: 25 mmol/L (ref 22–32)
TCO2: 26 mmol/L (ref 22–32)
TCO2: 26 mmol/L (ref 22–32)
TCO2: 23 mmol/L (ref 22–32)

## 2022-10-08 LAB — ECHO INTRAOPERATIVE TEE
Height: 62 in
Weight: 2894.41 oz

## 2022-10-08 LAB — BASIC METABOLIC PANEL
Anion gap: 8 (ref 5–15)
BUN: 8 mg/dL (ref 6–20)
CO2: 25 mmol/L (ref 22–32)
Calcium: 7.1 mg/dL — ABNORMAL LOW (ref 8.9–10.3)
Chloride: 106 mmol/L (ref 98–111)
Creatinine, Ser: 0.71 mg/dL (ref 0.44–1.00)
GFR, Estimated: >60 mL/min (ref >60)
Glucose, Bld: 128 mg/dL — ABNORMAL HIGH (ref 70–99)
Potassium: 4.2 mmol/L (ref 3.5–5.1)
Sodium: 139 mmol/L (ref 135–145)

## 2022-10-08 LAB — POCT I-STAT 7, (LYTES, BLD GAS, ICA,H+H)
Acid-base deficit: 1 mmol/L (ref 0.0–2.0)
Acid-base deficit: 2 mmol/L (ref 0.0–2.0)
Acid-base deficit: 1 mmol/L (ref 0.0–2.0)
Acid-base deficit: 1 mmol/L (ref 0.0–2.0)
Acid-base deficit: 2 mmol/L (ref 0.0–2.0)
Acid-base deficit: 2 mmol/L (ref 0.0–2.0)
Acid-base deficit: 3 mmol/L — ABNORMAL HIGH (ref 0.0–2.0)
Acid-base deficit: 4 mmol/L — ABNORMAL HIGH (ref 0.0–2.0)
Acid-base deficit: 2 mmol/L (ref 0.0–2.0)
Acid-base deficit: 1 mmol/L (ref 0.0–2.0)
Bicarbonate: 23 mmol/L (ref 20.0–28.0)
Bicarbonate: 23.7 mmol/L (ref 20.0–28.0)
Bicarbonate: 22.6 mmol/L (ref 20.0–28.0)
Bicarbonate: 23.1 mmol/L (ref 20.0–28.0)
Bicarbonate: 24.1 mmol/L (ref 20.0–28.0)
Bicarbonate: 23.3 mmol/L (ref 20.0–28.0)
Bicarbonate: 23.7 mmol/L (ref 20.0–28.0)
Bicarbonate: 24.3 mmol/L (ref 20.0–28.0)
Bicarbonate: 24.4 mmol/L (ref 20.0–28.0)
Bicarbonate: 25.5 mmol/L (ref 20.0–28.0)
Calcium, Ion: 0.97 mmol/L — ABNORMAL LOW (ref 1.15–1.40)
Calcium, Ion: 1.07 mmol/L — ABNORMAL LOW (ref 1.15–1.40)
Calcium, Ion: 1.01 mmol/L — ABNORMAL LOW (ref 1.15–1.40)
Calcium, Ion: 1.09 mmol/L — ABNORMAL LOW (ref 1.15–1.40)
Calcium, Ion: 1.1 mmol/L — ABNORMAL LOW (ref 1.15–1.40)
Calcium, Ion: 1.11 mmol/L — ABNORMAL LOW (ref 1.15–1.40)
Calcium, Ion: 1.12 mmol/L — ABNORMAL LOW (ref 1.15–1.40)
Calcium, Ion: 1.14 mmol/L — ABNORMAL LOW (ref 1.15–1.40)
Calcium, Ion: 1.08 mmol/L — ABNORMAL LOW (ref 1.15–1.40)
Calcium, Ion: 1.09 mmol/L — ABNORMAL LOW (ref 1.15–1.40)
HCT: 24 % — ABNORMAL LOW (ref 36.0–46.0)
HCT: 25 % — ABNORMAL LOW (ref 36.0–46.0)
HCT: 25 % — ABNORMAL LOW (ref 36.0–46.0)
HCT: 24 % — ABNORMAL LOW (ref 36.0–46.0)
HCT: 25 % — ABNORMAL LOW (ref 36.0–46.0)
HCT: 23 % — ABNORMAL LOW (ref 36.0–46.0)
HCT: 25 % — ABNORMAL LOW (ref 36.0–46.0)
HCT: 27 % — ABNORMAL LOW (ref 36.0–46.0)
HCT: 24 % — ABNORMAL LOW (ref 36.0–46.0)
HCT: 26 % — ABNORMAL LOW (ref 36.0–46.0)
Hemoglobin: 8.2 g/dL — ABNORMAL LOW (ref 12.0–15.0)
Hemoglobin: 8.5 g/dL — ABNORMAL LOW (ref 12.0–15.0)
Hemoglobin: 8.5 g/dL — ABNORMAL LOW (ref 12.0–15.0)
Hemoglobin: 8.2 g/dL — ABNORMAL LOW (ref 12.0–15.0)
Hemoglobin: 8.5 g/dL — ABNORMAL LOW (ref 12.0–15.0)
Hemoglobin: 7.8 g/dL — ABNORMAL LOW (ref 12.0–15.0)
Hemoglobin: 8.5 g/dL — ABNORMAL LOW (ref 12.0–15.0)
Hemoglobin: 9.2 g/dL — ABNORMAL LOW (ref 12.0–15.0)
Hemoglobin: 8.2 g/dL — ABNORMAL LOW (ref 12.0–15.0)
Hemoglobin: 8.8 g/dL — ABNORMAL LOW (ref 12.0–15.0)
O2 Saturation: 100 %
O2 Saturation: 100 %
O2 Saturation: 100 %
O2 Saturation: 100 %
O2 Saturation: 100 %
O2 Saturation: 100 %
O2 Saturation: 92 %
O2 Saturation: 93 %
O2 Saturation: 98 %
O2 Saturation: 92 %
Patient temperature: 36.9
Patient temperature: 36.9
Patient temperature: 37.2
Potassium: 3.7 mmol/L (ref 3.5–5.1)
Potassium: 4.8 mmol/L (ref 3.5–5.1)
Potassium: 4.5 mmol/L (ref 3.5–5.1)
Potassium: 4.6 mmol/L (ref 3.5–5.1)
Potassium: 4.8 mmol/L (ref 3.5–5.1)
Potassium: 4 mmol/L (ref 3.5–5.1)
Potassium: 4.1 mmol/L (ref 3.5–5.1)
Potassium: 4.7 mmol/L (ref 3.5–5.1)
Potassium: 4.5 mmol/L (ref 3.5–5.1)
Potassium: 4.5 mmol/L (ref 3.5–5.1)
Sodium: 134 mmol/L — ABNORMAL LOW (ref 135–145)
Sodium: 138 mmol/L (ref 135–145)
Sodium: 139 mmol/L (ref 135–145)
Sodium: 139 mmol/L (ref 135–145)
Sodium: 140 mmol/L (ref 135–145)
Sodium: 141 mmol/L (ref 135–145)
Sodium: 142 mmol/L (ref 135–145)
Sodium: 141 mmol/L (ref 135–145)
Sodium: 141 mmol/L (ref 135–145)
Sodium: 142 mmol/L (ref 135–145)
TCO2: 24 mmol/L (ref 22–32)
TCO2: 25 mmol/L (ref 22–32)
TCO2: 24 mmol/L (ref 22–32)
TCO2: 24 mmol/L (ref 22–32)
TCO2: 25 mmol/L (ref 22–32)
TCO2: 25 mmol/L (ref 22–32)
TCO2: 25 mmol/L (ref 22–32)
TCO2: 26 mmol/L (ref 22–32)
TCO2: 26 mmol/L (ref 22–32)
TCO2: 27 mmol/L (ref 22–32)
pCO2 arterial: 36 mmHg (ref 32–48)
pCO2 arterial: 38.8 mmHg (ref 32–48)
pCO2 arterial: 33.1 mmHg (ref 32–48)
pCO2 arterial: 39.3 mmHg (ref 32–48)
pCO2 arterial: 39.3 mmHg (ref 32–48)
pCO2 arterial: 46.1 mmHg (ref 32–48)
pCO2 arterial: 46.6 mmHg (ref 32–48)
pCO2 arterial: 62.5 mmHg — ABNORMAL HIGH (ref 32–48)
pCO2 arterial: 48.6 mmHg — ABNORMAL HIGH (ref 32–48)
pCO2 arterial: 52.8 mmHg — ABNORMAL HIGH (ref 32–48)
pH, Arterial: 7.381 (ref 7.35–7.45)
pH, Arterial: 7.427 (ref 7.35–7.45)
pH, Arterial: 7.443 (ref 7.35–7.45)
pH, Arterial: 7.377 (ref 7.35–7.45)
pH, Arterial: 7.395 (ref 7.35–7.45)
pH, Arterial: 7.307 — ABNORMAL LOW (ref 7.35–7.45)
pH, Arterial: 7.319 — ABNORMAL LOW (ref 7.35–7.45)
pH, Arterial: 7.196 — CL (ref 7.35–7.45)
pH, Arterial: 7.308 — ABNORMAL LOW (ref 7.35–7.45)
pH, Arterial: 7.293 — ABNORMAL LOW (ref 7.35–7.45)
pO2, Arterial: 326 mmHg — ABNORMAL HIGH (ref 83–108)
pO2, Arterial: 356 mmHg — ABNORMAL HIGH (ref 83–108)
pO2, Arterial: 335 mmHg — ABNORMAL HIGH (ref 83–108)
pO2, Arterial: 286 mmHg — ABNORMAL HIGH (ref 83–108)
pO2, Arterial: 344 mmHg — ABNORMAL HIGH (ref 83–108)
pO2, Arterial: 361 mmHg — ABNORMAL HIGH (ref 83–108)
pO2, Arterial: 69 mmHg — ABNORMAL LOW (ref 83–108)
pO2, Arterial: 84 mmHg (ref 83–108)
pO2, Arterial: 114 mmHg — ABNORMAL HIGH (ref 83–108)
pO2, Arterial: 73 mmHg — ABNORMAL LOW (ref 83–108)

## 2022-10-08 LAB — CBC
HCT: 27.6 % — ABNORMAL LOW (ref 36.0–46.0)
HCT: 27.3 % — ABNORMAL LOW (ref 36.0–46.0)
Hemoglobin: 9.1 g/dL — ABNORMAL LOW (ref 12.0–15.0)
Hemoglobin: 9.1 g/dL — ABNORMAL LOW (ref 12.0–15.0)
MCH: 31.3 pg (ref 26.0–34.0)
MCH: 30.8 pg (ref 26.0–34.0)
MCHC: 33 g/dL (ref 30.0–36.0)
MCHC: 33.3 g/dL (ref 30.0–36.0)
MCV: 94.8 fL (ref 80.0–100.0)
MCV: 92.5 fL (ref 80.0–100.0)
Platelets: 139 10*3/uL — ABNORMAL LOW (ref 150–400)
Platelets: 146 10*3/uL — ABNORMAL LOW (ref 150–400)
RBC: 2.91 MIL/uL — ABNORMAL LOW (ref 3.87–5.11)
RBC: 2.95 MIL/uL — ABNORMAL LOW (ref 3.87–5.11)
RDW: 13.6 % (ref 11.5–15.5)
RDW: 13.7 % (ref 11.5–15.5)
WBC: 21.6 10*3/uL — ABNORMAL HIGH (ref 4.0–10.5)
WBC: 16.8 10*3/uL — ABNORMAL HIGH (ref 4.0–10.5)
nRBC: 0 % (ref 0.0–0.2)
nRBC: 0 % (ref 0.0–0.2)

## 2022-10-08 LAB — HEMOGLOBIN AND HEMATOCRIT, BLOOD
HCT: 25.9 % — ABNORMAL LOW (ref 36.0–46.0)
Hemoglobin: 8.8 g/dL — ABNORMAL LOW (ref 12.0–15.0)

## 2022-10-08 LAB — POCT I-STAT EG7
Acid-Base Excess: 0 mmol/L (ref 0.0–2.0)
Bicarbonate: 25.4 mmol/L (ref 20.0–28.0)
Calcium, Ion: 1.07 mmol/L — ABNORMAL LOW (ref 1.15–1.40)
HCT: 25 % — ABNORMAL LOW (ref 36.0–46.0)
Hemoglobin: 8.5 g/dL — ABNORMAL LOW (ref 12.0–15.0)
O2 Saturation: 68 %
Potassium: 3.7 mmol/L (ref 3.5–5.1)
Sodium: 139 mmol/L (ref 135–145)
TCO2: 27 mmol/L (ref 22–32)
pCO2, Ven: 46.8 mmHg (ref 44–60)
pH, Ven: 7.342 (ref 7.25–7.43)
pO2, Ven: 38 mmHg (ref 32–45)

## 2022-10-08 LAB — POCT PREGNANCY, URINE: Preg Test, Ur: NEGATIVE

## 2022-10-08 LAB — GLUCOSE, CAPILLARY
Glucose-Capillary: 100 mg/dL — ABNORMAL HIGH (ref 70–99)
Glucose-Capillary: 108 mg/dL — ABNORMAL HIGH (ref 70–99)
Glucose-Capillary: 119 mg/dL — ABNORMAL HIGH (ref 70–99)
Glucose-Capillary: 123 mg/dL — ABNORMAL HIGH (ref 70–99)
Glucose-Capillary: 132 mg/dL — ABNORMAL HIGH (ref 70–99)
Glucose-Capillary: 132 mg/dL — ABNORMAL HIGH (ref 70–99)
Glucose-Capillary: 134 mg/dL — ABNORMAL HIGH (ref 70–99)
Glucose-Capillary: 151 mg/dL — ABNORMAL HIGH (ref 70–99)
Glucose-Capillary: 98 mg/dL (ref 70–99)

## 2022-10-08 LAB — MAGNESIUM: Magnesium: 2.9 mg/dL — ABNORMAL HIGH (ref 1.7–2.4)

## 2022-10-08 LAB — PROTIME-INR
INR: 1.7 — ABNORMAL HIGH (ref 0.8–1.2)
Prothrombin Time: 19.8 seconds — ABNORMAL HIGH (ref 11.4–15.2)

## 2022-10-08 LAB — PLATELET COUNT: Platelets: 156 10*3/uL (ref 150–400)

## 2022-10-08 LAB — APTT: aPTT: 29 seconds (ref 24–36)

## 2022-10-08 SURGERY — CORONARY ARTERY BYPASS GRAFTING (CABG)
Anesthesia: General | Site: Chest

## 2022-10-08 MED ORDER — PHENYLEPHRINE 80 MCG/ML (10ML) SYRINGE FOR IV PUSH (FOR BLOOD PRESSURE SUPPORT)
PREFILLED_SYRINGE | INTRAVENOUS | Status: DC | PRN
  Administered 2022-10-08 (×5): 40 ug via INTRAVENOUS
  Administered 2022-10-08: 80 ug via INTRAVENOUS
  Administered 2022-10-08 (×3): 40 ug via INTRAVENOUS

## 2022-10-08 MED ORDER — SODIUM CHLORIDE 0.9 % IV SOLN: 250.0000 mL | INTRAVENOUS | Status: DC

## 2022-10-08 MED ORDER — MAGNESIUM SULFATE 4 GM/100ML IV SOLN
4.0000 g | Freq: Once | INTRAVENOUS | Status: AC
  Administered 2022-10-08: 4 g via INTRAVENOUS
  Filled 2022-10-08: qty 100

## 2022-10-08 MED ORDER — LACTATED RINGERS IV SOLN: INTRAVENOUS | Status: DC | PRN

## 2022-10-08 MED ORDER — MIDAZOLAM HCL (PF) 10 MG/2ML IJ SOLN
INTRAMUSCULAR | Status: AC
  Filled 2022-10-08: qty 2

## 2022-10-08 MED ORDER — METOPROLOL TARTRATE 25 MG/10 ML ORAL SUSPENSION: 12.5000 mg | Freq: Two times a day (BID) | ORAL | Status: DC

## 2022-10-08 MED ORDER — THROMBIN (RECOMBINANT) 20000 UNITS EX SOLR
CUTANEOUS | Status: AC
  Filled 2022-10-08: qty 20000

## 2022-10-08 MED ORDER — METOPROLOL TARTRATE 12.5 MG HALF TABLET: 12.5000 mg | ORAL_TABLET | Freq: Two times a day (BID) | ORAL | Status: DC

## 2022-10-08 MED ORDER — CHLORHEXIDINE GLUCONATE 0.12 % MT SOLN
15.0000 mL | OROMUCOSAL | Status: AC
  Administered 2022-10-08: 15 mL via OROMUCOSAL
  Filled 2022-10-08: qty 15

## 2022-10-08 MED ORDER — INSULIN REGULAR(HUMAN) IN NACL 100-0.9 UT/100ML-% IV SOLN: INTRAVENOUS | Status: DC

## 2022-10-08 MED ORDER — LACTATED RINGERS IV SOLN: INTRAVENOUS | Status: DC

## 2022-10-08 MED ORDER — OXYCODONE HCL 5 MG PO TABS
5.0000 mg | ORAL_TABLET | ORAL | Status: DC | PRN
  Administered 2022-10-08 – 2022-10-14 (×27): 10 mg via ORAL
  Filled 2022-10-08 (×28): qty 2

## 2022-10-08 MED ORDER — HEPARIN SODIUM (PORCINE) 1000 UNIT/ML IJ SOLN
INTRAMUSCULAR | Status: AC
  Filled 2022-10-08: qty 1

## 2022-10-08 MED ORDER — EPHEDRINE 5 MG/ML INJ
INTRAVENOUS | Status: AC
  Filled 2022-10-08: qty 5

## 2022-10-08 MED ORDER — 0.9 % SODIUM CHLORIDE (POUR BTL) OPTIME
TOPICAL | Status: DC | PRN
  Administered 2022-10-08: 5000 mL

## 2022-10-08 MED ORDER — CEFAZOLIN SODIUM-DEXTROSE 2-4 GM/100ML-% IV SOLN
2.0000 g | Freq: Three times a day (TID) | INTRAVENOUS | Status: AC
  Administered 2022-10-08 – 2022-10-10 (×6): 2 g via INTRAVENOUS
  Filled 2022-10-08 (×6): qty 100

## 2022-10-08 MED ORDER — ACETAMINOPHEN 160 MG/5ML PO SOLN: 650.0000 mg | Freq: Once | ORAL | Status: AC

## 2022-10-08 MED ORDER — CHLORHEXIDINE GLUCONATE 4 % EX LIQD: 30.0000 mL | CUTANEOUS | Status: DC

## 2022-10-08 MED ORDER — ESCITALOPRAM OXALATE 10 MG PO TABS
10.0000 mg | ORAL_TABLET | Freq: Every day | ORAL | Status: DC | PRN
  Administered 2022-10-13: 10 mg via ORAL
  Filled 2022-10-08 (×2): qty 1

## 2022-10-08 MED ORDER — ONDANSETRON HCL 4 MG/2ML IJ SOLN
4.0000 mg | Freq: Four times a day (QID) | INTRAMUSCULAR | Status: DC | PRN
  Administered 2022-10-10 – 2022-10-12 (×2): 4 mg via INTRAVENOUS
  Filled 2022-10-08 (×3): qty 2

## 2022-10-08 MED ORDER — LACTATED RINGERS IV SOLN: 500.0000 mL | Freq: Once | INTRAVENOUS | Status: DC | PRN

## 2022-10-08 MED ORDER — FENTANYL CITRATE (PF) 250 MCG/5ML IJ SOLN
INTRAMUSCULAR | Status: AC
  Filled 2022-10-08: qty 5

## 2022-10-08 MED ORDER — SODIUM CHLORIDE 0.9 % IV SOLN: INTRAVENOUS | Status: DC | PRN

## 2022-10-08 MED ORDER — FENTANYL CITRATE (PF) 250 MCG/5ML IJ SOLN
INTRAMUSCULAR | Status: DC | PRN
  Administered 2022-10-08: 50 ug via INTRAVENOUS
  Administered 2022-10-08: 150 ug via INTRAVENOUS
  Administered 2022-10-08: 50 ug via INTRAVENOUS
  Administered 2022-10-08: 450 ug via INTRAVENOUS
  Administered 2022-10-08: 100 ug via INTRAVENOUS
  Administered 2022-10-08: 50 ug via INTRAVENOUS
  Administered 2022-10-08 (×4): 100 ug via INTRAVENOUS

## 2022-10-08 MED ORDER — SODIUM BICARBONATE 8.4 % IV SOLN
50.0000 meq | Freq: Once | INTRAVENOUS | Status: AC
  Administered 2022-10-08: 50 meq via INTRAVENOUS

## 2022-10-08 MED ORDER — CHLORHEXIDINE GLUCONATE 0.12 % MT SOLN
15.0000 mL | Freq: Once | OROMUCOSAL | Status: AC
  Administered 2022-10-08: 15 mL via OROMUCOSAL
  Filled 2022-10-08: qty 15

## 2022-10-08 MED ORDER — ALBUMIN HUMAN 5 % IV SOLN: INTRAVENOUS | Status: DC | PRN

## 2022-10-08 MED ORDER — ORAL CARE MOUTH RINSE: 15.0000 mL | Freq: Once | OROMUCOSAL | Status: AC

## 2022-10-08 MED ORDER — PLASMA-LYTE A IV SOLN: INTRAVENOUS | Status: DC | PRN

## 2022-10-08 MED ORDER — BISACODYL 5 MG PO TBEC
10.0000 mg | DELAYED_RELEASE_TABLET | Freq: Every day | ORAL | Status: DC
  Administered 2022-10-09 – 2022-10-11 (×3): 10 mg via ORAL
  Filled 2022-10-08 (×3): qty 2

## 2022-10-08 MED ORDER — PANTOPRAZOLE SODIUM 40 MG PO TBEC
40.0000 mg | DELAYED_RELEASE_TABLET | Freq: Every day | ORAL | Status: DC
  Administered 2022-10-10 – 2022-10-15 (×6): 40 mg via ORAL
  Filled 2022-10-08 (×6): qty 1

## 2022-10-08 MED ORDER — ~~LOC~~ CARDIAC SURGERY, PATIENT & FAMILY EDUCATION
Freq: Once | Status: DC
  Filled 2022-10-08: qty 1

## 2022-10-08 MED ORDER — DOCUSATE SODIUM 100 MG PO CAPS
200.0000 mg | ORAL_CAPSULE | Freq: Every day | ORAL | Status: DC
  Administered 2022-10-09 – 2022-10-11 (×3): 200 mg via ORAL
  Filled 2022-10-08 (×3): qty 2

## 2022-10-08 MED ORDER — PROTAMINE SULFATE 10 MG/ML IV SOLN
INTRAVENOUS | Status: DC | PRN
  Administered 2022-10-08: 280 mg via INTRAVENOUS

## 2022-10-08 MED ORDER — MORPHINE SULFATE (PF) 2 MG/ML IV SOLN
1.0000 mg | INTRAVENOUS | Status: DC | PRN
  Administered 2022-10-08 – 2022-10-10 (×8): 2 mg via INTRAVENOUS
  Filled 2022-10-08: qty 2
  Filled 2022-10-08 (×7): qty 1

## 2022-10-08 MED ORDER — SODIUM CHLORIDE 0.9% FLUSH: 3.0000 mL | INTRAVENOUS | Status: DC | PRN

## 2022-10-08 MED ORDER — MIDAZOLAM HCL 2 MG/2ML IJ SOLN: 2.0000 mg | INTRAMUSCULAR | Status: DC | PRN

## 2022-10-08 MED ORDER — BISACODYL 10 MG RE SUPP: 10.0000 mg | Freq: Every day | RECTAL | Status: DC

## 2022-10-08 MED ORDER — POTASSIUM CHLORIDE 10 MEQ/50ML IV SOLN: 10.0000 meq | INTRAVENOUS | Status: AC

## 2022-10-08 MED ORDER — NITROGLYCERIN IN D5W 200-5 MCG/ML-% IV SOLN
0.0000 ug/min | INTRAVENOUS | Status: DC
  Administered 2022-10-08: 5 ug/min via INTRAVENOUS

## 2022-10-08 MED ORDER — THROMBIN 20000 UNITS EX SOLR
OROMUCOSAL | Status: DC | PRN
  Administered 2022-10-08 (×4): 4 mL via TOPICAL

## 2022-10-08 MED ORDER — ALBUMIN HUMAN 5 % IV SOLN
250.0000 mL | INTRAVENOUS | Status: AC | PRN
  Administered 2022-10-08 – 2022-10-09 (×4): 12.5 g via INTRAVENOUS
  Filled 2022-10-08 (×2): qty 250

## 2022-10-08 MED ORDER — ROCURONIUM BROMIDE 10 MG/ML (PF) SYRINGE
PREFILLED_SYRINGE | INTRAVENOUS | Status: AC
  Filled 2022-10-08: qty 10

## 2022-10-08 MED ORDER — ATORVASTATIN CALCIUM 80 MG PO TABS
80.0000 mg | ORAL_TABLET | Freq: Every day | ORAL | Status: DC
  Administered 2022-10-09 – 2022-10-15 (×7): 80 mg via ORAL
  Filled 2022-10-08 (×7): qty 1

## 2022-10-08 MED ORDER — ACETAMINOPHEN 650 MG RE SUPP
650.0000 mg | Freq: Once | RECTAL | Status: AC
  Administered 2022-10-08: 650 mg via RECTAL

## 2022-10-08 MED ORDER — ACETAMINOPHEN 160 MG/5ML PO SOLN: 1000.0000 mg | Freq: Four times a day (QID) | ORAL | Status: DC

## 2022-10-08 MED ORDER — PHENYLEPHRINE 80 MCG/ML (10ML) SYRINGE FOR IV PUSH (FOR BLOOD PRESSURE SUPPORT)
PREFILLED_SYRINGE | INTRAVENOUS | Status: AC
  Filled 2022-10-08: qty 10

## 2022-10-08 MED ORDER — TRAMADOL HCL 50 MG PO TABS
50.0000 mg | ORAL_TABLET | ORAL | Status: DC | PRN
  Administered 2022-10-08: 100 mg via ORAL
  Administered 2022-10-09: 50 mg via ORAL
  Administered 2022-10-09 – 2022-10-10 (×6): 100 mg via ORAL
  Administered 2022-10-10: 50 mg via ORAL
  Administered 2022-10-12 – 2022-10-15 (×9): 100 mg via ORAL
  Administered 2022-10-15: 50 mg via ORAL
  Filled 2022-10-08 (×6): qty 2
  Filled 2022-10-08: qty 1
  Filled 2022-10-08 (×5): qty 2
  Filled 2022-10-08: qty 1
  Filled 2022-10-08 (×6): qty 2

## 2022-10-08 MED ORDER — ACETAMINOPHEN 500 MG PO TABS
1000.0000 mg | ORAL_TABLET | Freq: Four times a day (QID) | ORAL | Status: AC
  Administered 2022-10-08 – 2022-10-13 (×18): 1000 mg via ORAL
  Filled 2022-10-08 (×20): qty 2

## 2022-10-08 MED ORDER — EPHEDRINE SULFATE-NACL 50-0.9 MG/10ML-% IV SOSY
PREFILLED_SYRINGE | INTRAVENOUS | Status: DC | PRN
  Administered 2022-10-08: 5 mg via INTRAVENOUS

## 2022-10-08 MED ORDER — HEPARIN SODIUM (PORCINE) 1000 UNIT/ML IJ SOLN
INTRAMUSCULAR | Status: DC | PRN
  Administered 2022-10-08: 28000 [IU] via INTRAVENOUS

## 2022-10-08 MED ORDER — MIDAZOLAM HCL (PF) 5 MG/ML IJ SOLN
INTRAMUSCULAR | Status: DC | PRN
  Administered 2022-10-08: 3 mg via INTRAVENOUS
  Administered 2022-10-08: 1 mg via INTRAVENOUS
  Administered 2022-10-08: 2 mg via INTRAVENOUS
  Administered 2022-10-08 (×2): 1 mg via INTRAVENOUS
  Administered 2022-10-08: 2 mg via INTRAVENOUS

## 2022-10-08 MED ORDER — SODIUM CHLORIDE 0.9 % IV SOLN: INTRAVENOUS | Status: DC

## 2022-10-08 MED ORDER — PHENYLEPHRINE HCL-NACL 20-0.9 MG/250ML-% IV SOLN
0.0000 ug/min | INTRAVENOUS | Status: DC
  Administered 2022-10-09 (×2): 20 ug/min via INTRAVENOUS
  Filled 2022-10-08 (×2): qty 250

## 2022-10-08 MED ORDER — DEXTROSE 50 % IV SOLN: 0.0000 mL | INTRAVENOUS | Status: DC | PRN

## 2022-10-08 MED ORDER — PROPOFOL 10 MG/ML IV BOLUS
INTRAVENOUS | Status: AC
  Filled 2022-10-08: qty 20

## 2022-10-08 MED ORDER — ROCURONIUM BROMIDE 10 MG/ML (PF) SYRINGE
PREFILLED_SYRINGE | INTRAVENOUS | Status: DC | PRN
  Administered 2022-10-08: 100 mg via INTRAVENOUS
  Administered 2022-10-08: 40 mg via INTRAVENOUS
  Administered 2022-10-08: 30 mg via INTRAVENOUS
  Administered 2022-10-08: 60 mg via INTRAVENOUS
  Administered 2022-10-08: 20 mg via INTRAVENOUS
  Administered 2022-10-08: 50 mg via INTRAVENOUS

## 2022-10-08 MED ORDER — PHENYLEPHRINE 80 MCG/ML (10ML) SYRINGE FOR IV PUSH (FOR BLOOD PRESSURE SUPPORT): PREFILLED_SYRINGE | INTRAVENOUS | Status: DC | PRN

## 2022-10-08 MED ORDER — ASPIRIN 81 MG PO CHEW: 324.0000 mg | CHEWABLE_TABLET | Freq: Every day | ORAL | Status: DC

## 2022-10-08 MED ORDER — HEMOSTATIC AGENTS (NO CHARGE) OPTIME
TOPICAL | Status: DC | PRN
  Administered 2022-10-08: 1 via TOPICAL

## 2022-10-08 MED ORDER — VANCOMYCIN HCL IN DEXTROSE 1-5 GM/200ML-% IV SOLN
1000.0000 mg | Freq: Once | INTRAVENOUS | Status: AC
  Administered 2022-10-08: 1000 mg via INTRAVENOUS
  Filled 2022-10-08: qty 200

## 2022-10-08 MED ORDER — DEXMEDETOMIDINE HCL IN NACL 400 MCG/100ML IV SOLN
0.0000 ug/kg/h | INTRAVENOUS | Status: DC
  Administered 2022-10-08: 0.6 ug/kg/h via INTRAVENOUS
  Filled 2022-10-08: qty 100

## 2022-10-08 MED ORDER — ROCURONIUM BROMIDE 10 MG/ML (PF) SYRINGE
PREFILLED_SYRINGE | INTRAVENOUS | Status: AC
  Filled 2022-10-08: qty 20

## 2022-10-08 MED ORDER — ASPIRIN 325 MG PO TBEC
325.0000 mg | DELAYED_RELEASE_TABLET | Freq: Every day | ORAL | Status: DC
  Administered 2022-10-09 – 2022-10-11 (×3): 325 mg via ORAL
  Filled 2022-10-08 (×3): qty 1

## 2022-10-08 MED ORDER — CHLORHEXIDINE GLUCONATE 0.12 % MT SOLN: 15.0000 mL | Freq: Once | OROMUCOSAL | Status: DC

## 2022-10-08 MED ORDER — SODIUM CHLORIDE 0.45 % IV SOLN: INTRAVENOUS | Status: DC | PRN

## 2022-10-08 MED ORDER — FAMOTIDINE IN NACL 20-0.9 MG/50ML-% IV SOLN
20.0000 mg | Freq: Two times a day (BID) | INTRAVENOUS | Status: AC
  Administered 2022-10-08 (×2): 20 mg via INTRAVENOUS
  Filled 2022-10-08 (×2): qty 50

## 2022-10-08 MED ORDER — CHLORHEXIDINE GLUCONATE CLOTH 2 % EX PADS
6.0000 | MEDICATED_PAD | Freq: Every day | CUTANEOUS | Status: DC
  Administered 2022-10-08 – 2022-10-11 (×5): 6 via TOPICAL

## 2022-10-08 MED ORDER — PROPOFOL 10 MG/ML IV BOLUS
INTRAVENOUS | Status: DC | PRN
  Administered 2022-10-08: 50 mg via INTRAVENOUS
  Administered 2022-10-08: 100 mg via INTRAVENOUS
  Administered 2022-10-08 (×2): 30 mg via INTRAVENOUS
  Administered 2022-10-08: 20 mg via INTRAVENOUS

## 2022-10-08 MED ORDER — SODIUM CHLORIDE 0.9% FLUSH
3.0000 mL | Freq: Two times a day (BID) | INTRAVENOUS | Status: DC
  Administered 2022-10-09 – 2022-10-11 (×6): 3 mL via INTRAVENOUS

## 2022-10-08 MED ORDER — ACETAMINOPHEN 500 MG PO TABS
1000.0000 mg | ORAL_TABLET | Freq: Once | ORAL | Status: AC
  Administered 2022-10-08: 1000 mg via ORAL
  Filled 2022-10-08: qty 2

## 2022-10-08 MED ORDER — METOPROLOL TARTRATE 5 MG/5ML IV SOLN: 2.5000 mg | INTRAVENOUS | Status: DC | PRN

## 2022-10-08 MED ORDER — METOPROLOL TARTRATE 12.5 MG HALF TABLET
12.5000 mg | ORAL_TABLET | Freq: Once | ORAL | Status: AC
  Administered 2022-10-08: 12.5 mg via ORAL
  Filled 2022-10-08: qty 1

## 2022-10-08 SURGICAL SUPPLY — 122 items
ADH SKN CLS APL DERMABOND .7 (GAUZE/BANDAGES/DRESSINGS) ×2
ARTICLIP LAA PROCLIP II 35 (Clip) ×2 IMPLANT
BAG DECANTER FOR FLEXI CONT (MISCELLANEOUS) ×2 IMPLANT
BINDER BREAST XXLRG (GAUZE/BANDAGES/DRESSINGS) IMPLANT
BLADE CLIPPER SURG (BLADE) ×2 IMPLANT
BLADE STERNUM SYSTEM 6 (BLADE) ×2 IMPLANT
BNDG CMPR 5X62 HK CLSR LF (GAUZE/BANDAGES/DRESSINGS) ×2
BNDG ELASTIC 4X5.8 VLCR STR LF (GAUZE/BANDAGES/DRESSINGS) ×2 IMPLANT
BNDG ELASTIC 6INX 5YD STR LF (GAUZE/BANDAGES/DRESSINGS) IMPLANT
BNDG ELASTIC 6X5.8 VLCR STR LF (GAUZE/BANDAGES/DRESSINGS) ×2 IMPLANT
BNDG GAUZE DERMACEA FLUFF 4 (GAUZE/BANDAGES/DRESSINGS) ×2 IMPLANT
BNDG GZE DERMACEA 4 6PLY (GAUZE/BANDAGES/DRESSINGS) ×2
CANISTER SUCT 3000ML PPV (MISCELLANEOUS) ×2 IMPLANT
CANNULA ARTERIAL NVNT 3/8 20FR (MISCELLANEOUS) IMPLANT
CANNULA MC2 2 STG 36/46 CONN (CANNULA) IMPLANT
CANNULA VESSEL 3MM BLUNT TIP (CANNULA) IMPLANT
CARDIOBLATE CARDIAC ABLATION (MISCELLANEOUS)
CATH ROBINSON RED A/P 18FR (CATHETERS) ×4 IMPLANT
CATH THORACIC 28FR (CATHETERS) ×2 IMPLANT
CATH THORACIC 36FR (CATHETERS) ×2 IMPLANT
CATH THORACIC 36FR RT ANG (CATHETERS) ×2 IMPLANT
CLAMP ISOLATOR ENCOMPASS RF (ELECTROSURGICAL) IMPLANT
CLIP TI MEDIUM 24 (CLIP) IMPLANT
CLIP TI WIDE RED SMALL 24 (CLIP) IMPLANT
CONN Y 3/8X3/8X3/8  BEN (MISCELLANEOUS) ×4
CONN Y 3/8X3/8X3/8 BEN (MISCELLANEOUS) IMPLANT
CONTAINER PROTECT SURGISLUSH (MISCELLANEOUS) ×4 IMPLANT
DERMABOND ADVANCED .7 DNX12 (GAUZE/BANDAGES/DRESSINGS) IMPLANT
DEVICE ATRICLIP LAA PRCLPII 35 (Clip) IMPLANT
DEVICE CARDIOBLATE CARDIAC ABL (MISCELLANEOUS) IMPLANT
DRAPE CARDIOVASCULAR INCISE (DRAPES) ×2
DRAPE SRG 135X102X78XABS (DRAPES) ×2 IMPLANT
DRAPE WARM FLUID 44X44 (DRAPES) ×2 IMPLANT
DRSG COVADERM 4X14 (GAUZE/BANDAGES/DRESSINGS) ×2 IMPLANT
ELECT BLADE 4.0 EZ CLEAN MEGAD (MISCELLANEOUS) ×2
ELECT CAUTERY BLADE 6.4 (BLADE) ×2 IMPLANT
ELECT REM PT RETURN 9FT ADLT (ELECTROSURGICAL) ×4
ELECTRODE BLDE 4.0 EZ CLN MEGD (MISCELLANEOUS) IMPLANT
ELECTRODE REM PT RTRN 9FT ADLT (ELECTROSURGICAL) ×4 IMPLANT
FELT TEFLON 1X6 (MISCELLANEOUS) ×2 IMPLANT
GAUZE 4X4 16PLY ~~LOC~~+RFID DBL (SPONGE) ×2 IMPLANT
GAUZE SPONGE 4X4 12PLY STRL (GAUZE/BANDAGES/DRESSINGS) ×4 IMPLANT
GLOVE BIO SURGEON STRL SZ 6 (GLOVE) IMPLANT
GLOVE BIO SURGEON STRL SZ 6.5 (GLOVE) IMPLANT
GLOVE BIO SURGEON STRL SZ7 (GLOVE) IMPLANT
GLOVE BIO SURGEON STRL SZ7.5 (GLOVE) IMPLANT
GLOVE BIOGEL PI IND STRL 6 (GLOVE) IMPLANT
GLOVE BIOGEL PI IND STRL 6.5 (GLOVE) IMPLANT
GLOVE BIOGEL PI IND STRL 7.0 (GLOVE) IMPLANT
GLOVE ECLIPSE 7.0 STRL STRAW (GLOVE) ×4 IMPLANT
GLOVE ORTHO TXT STRL SZ7.5 (GLOVE) IMPLANT
GLOVE SURG SS PI 7.5 STRL IVOR (GLOVE) IMPLANT
GOWN STRL REUS W/ TWL LRG LVL3 (GOWN DISPOSABLE) ×8 IMPLANT
GOWN STRL REUS W/ TWL XL LVL3 (GOWN DISPOSABLE) ×2 IMPLANT
GOWN STRL REUS W/TWL LRG LVL3 (GOWN DISPOSABLE) ×8
GOWN STRL REUS W/TWL XL LVL3 (GOWN DISPOSABLE) ×2
HEMOSTAT POWDER SURGIFOAM 1G (HEMOSTASIS) ×6 IMPLANT
HEMOSTAT SURGICEL 2X14 (HEMOSTASIS) ×2 IMPLANT
INSERT FOGARTY 61MM (MISCELLANEOUS) IMPLANT
INSERT FOGARTY XLG (MISCELLANEOUS) IMPLANT
KIT BASIN OR (CUSTOM PROCEDURE TRAY) ×2 IMPLANT
KIT SUCTION CATH 14FR (SUCTIONS) ×2 IMPLANT
KIT TURNOVER KIT B (KITS) ×2 IMPLANT
KIT VASOVIEW HEMOPRO 2 VH 4000 (KITS) ×2 IMPLANT
LEAD PACING MYOCARDI (MISCELLANEOUS) IMPLANT
NS IRRIG 1000ML POUR BTL (IV SOLUTION) ×10 IMPLANT
PACK E OPEN HEART (SUTURE) ×2 IMPLANT
PACK OPEN HEART (CUSTOM PROCEDURE TRAY) ×2 IMPLANT
PAD ARMBOARD 7.5X6 YLW CONV (MISCELLANEOUS) ×4 IMPLANT
PAD ELECT DEFIB RADIOL ZOLL (MISCELLANEOUS) ×2 IMPLANT
PENCIL BUTTON HOLSTER BLD 10FT (ELECTRODE) ×2 IMPLANT
POSITIONER HEAD DONUT 9IN (MISCELLANEOUS) ×2 IMPLANT
PROBE CRYO2-ABLATION MALLABLE (MISCELLANEOUS) IMPLANT
PUNCH AORTIC ROTATE 4.0MM (MISCELLANEOUS) IMPLANT
PUNCH AORTIC ROTATE 4.5MM 8IN (MISCELLANEOUS) ×2 IMPLANT
PUNCH AORTIC ROTATE 5MM 8IN (MISCELLANEOUS) IMPLANT
SET MPS 3-ND DEL (MISCELLANEOUS) IMPLANT
SPONGE INTESTINAL PEANUT (DISPOSABLE) IMPLANT
SPONGE T-LAP 18X18 ~~LOC~~+RFID (SPONGE) ×8 IMPLANT
SPONGE T-LAP 4X18 ~~LOC~~+RFID (SPONGE) ×2 IMPLANT
SUPPORT HEART JANKE-BARRON (MISCELLANEOUS) ×2 IMPLANT
SUT BONE WAX W31G (SUTURE) ×2 IMPLANT
SUT MNCRL AB 4-0 PS2 18 (SUTURE) IMPLANT
SUT PROLENE 3 0 SH DA (SUTURE) IMPLANT
SUT PROLENE 3 0 SH1 36 (SUTURE) ×2 IMPLANT
SUT PROLENE 4 0 RB 1 (SUTURE)
SUT PROLENE 4 0 SH DA (SUTURE) IMPLANT
SUT PROLENE 4-0 RB1 .5 CRCL 36 (SUTURE) IMPLANT
SUT PROLENE 5 0 C 1 36 (SUTURE) IMPLANT
SUT PROLENE 6 0 C 1 30 (SUTURE) IMPLANT
SUT PROLENE 7 0 BV 1 (SUTURE) IMPLANT
SUT PROLENE 7 0 BV1 MDA (SUTURE) ×2 IMPLANT
SUT PROLENE 8 0 BV175 6 (SUTURE) IMPLANT
SUT PROLENE BLUE 7 0 (SUTURE) IMPLANT
SUT SILK  1 MH (SUTURE)
SUT SILK 1 MH (SUTURE) IMPLANT
SUT SILK 2 0 SH (SUTURE) IMPLANT
SUT STEEL STERNAL CCS#1 18IN (SUTURE) IMPLANT
SUT STEEL SZ 6 DBL 3X14 BALL (SUTURE) IMPLANT
SUT VIC AB 1 CTX 27 (SUTURE) IMPLANT
SUT VIC AB 1 CTX 36 (SUTURE) ×4
SUT VIC AB 1 CTX36XBRD ANBCTR (SUTURE) ×4 IMPLANT
SUT VIC AB 2-0 CT1 27 (SUTURE) ×2
SUT VIC AB 2-0 CT1 TAPERPNT 27 (SUTURE) IMPLANT
SUT VIC AB 2-0 CTX 27 (SUTURE) IMPLANT
SUT VIC AB 3-0 SH 27 (SUTURE)
SUT VIC AB 3-0 SH 27X BRD (SUTURE) IMPLANT
SUT VIC AB 3-0 X1 27 (SUTURE) IMPLANT
SUT VICRYL 4-0 PS2 18IN ABS (SUTURE) IMPLANT
SYS ATRICLIP LAA EXCLUSION 35 (Clip) IMPLANT
SYS ATRICLIP LAA EXCLUSION 40 (Clip) IMPLANT
SYS ATRICLIP LAA EXCLUSION 45 (Clip) IMPLANT
SYS ATRICLIP LAA EXCLUSION 50 (Clip) IMPLANT
SYSTEM SAHARA CHEST DRAIN ATS (WOUND CARE) ×2 IMPLANT
TAPE CLOTH SURG 4X10 WHT LF (GAUZE/BANDAGES/DRESSINGS) IMPLANT
TAPE PAPER 2X10 WHT MICROPORE (GAUZE/BANDAGES/DRESSINGS) IMPLANT
TOWEL GREEN STERILE (TOWEL DISPOSABLE) ×2 IMPLANT
TOWEL GREEN STERILE FF (TOWEL DISPOSABLE) ×2 IMPLANT
TRAY FOLEY SLVR 16FR TEMP STAT (SET/KITS/TRAYS/PACK) ×2 IMPLANT
TUBING LAP HI FLOW INSUFFLATIO (TUBING) ×2 IMPLANT
UNDERPAD 30X36 HEAVY ABSORB (UNDERPADS AND DIAPERS) ×2 IMPLANT
WATER STERILE IRR 1000ML POUR (IV SOLUTION) ×4 IMPLANT

## 2022-10-08 NOTE — Op Note (Signed)
CARDIOVASCULAR SURGERY OPERATIVE NOTE  10/08/2022  Surgeon:  Gaye Pollack, MD  First Assistant: Lucas Mallow, MD and Enid Cutter,  PA-C:   An experienced assistant was required given the complexity of this surgery and the standard of surgical care. The assistant was needed for endoscopic vein harvest, exposure, dissection, suctioning, retraction of delicate tissues and sutures, instrument exchange and for overall help during this procedure.   Preoperative Diagnosis:  Severe multi-vessel coronary artery disease   Postoperative Diagnosis:  Same   Procedure:  Median Sternotomy Extracorporeal circulation 3.   Coronary artery bypass grafting x 3  Left internal mammary artery graft to the LAD SVG to diagonal SVG to OM1  4.   Endoscopic vein harvest from the right leg  5.   Pulmonary vein isolation ablation using the Encompass clamp.  6.   Ligation of left atrial appendage.   Anesthesia:  General Endotracheal   Clinical History/Surgical Indication:  This 52 year old woman has severe multivessel coronary disease with high-grade LAD stenosis affecting a large second diagonal branch.  There is also an irregular 60% mid RCA stenosis in a large dominant vessel which has a negative FFR but looks hemodynamically significant.  I agree that coronary artery bypass graft surgery is the best treatment for resolution of her symptoms and to preserve myocardium.  She has had some episodes of tachycardia and was told at a fire station that she was atrial fibrillation previously.  All of her ECGs in EPIC shows sinus rhythm.  She has had a lot of problems with these tachypalpitations that have been very symptomatic and is on Eliquis with the presumption that she may be having atrial fibrillation.  I think it would be worthwhile doing a pulmonary vein ablation to decrease the risk of atrial fibrillation  as well as clipping her left atrial appendage.  I reviewed the cardiac catheterization and echo images with her and answered her questions.  I would plan to use a left internal mammary graft for the LAD and vein grafts for her diagonal and right coronary artery.  I do not think it would be a good idea to attempt bilateral internal mammary artery grafts in a relatively short, obese woman who is a heavy smoker.  Her radial artery pulses are relatively weak and her arms are short and I doubt that her radial arteries would be ideal grafts.   I discussed the operative procedure with the patient including alternatives, benefits and risks; including but not limited to bleeding, blood transfusion, infection, stroke, myocardial infarction, graft failure, heart block requiring a permanent pacemaker, organ dysfunction, and death.  I also discussed the importance of smoking cessation and preventing further progression of coronary disease in her own native arteries or bypass grafts.  Lorenz Coaster understands and agrees to proceed.    Preparation:  The patient was seen in the preoperative holding area and the correct patient, correct operation were confirmed with the patient after reviewing the medical record and catheterization. The consent was signed by me. Preoperative antibiotics were given. A pulmonary arterial line and radial arterial line were placed by the anesthesia team. The patient was taken back to the operating room and positioned supine on the operating room table. After being placed under general endotracheal anesthesia by the anesthesia team a foley catheter was placed. The neck, chest, abdomen, and both legs were prepped with betadine soap and solution and draped in the usual sterile manner. A surgical time-out was taken and the correct patient and operative procedure  were confirmed with the nursing and anesthesia staff.   Cardiopulmonary Bypass:  A median sternotomy was performed. The pericardium  was opened in the midline. Right ventricular function appeared normal. The ascending aorta was of normal size and had no palpable plaque. There were no contraindications to aortic cannulation or cross-clamping. The patient was fully systemically heparinized and the ACT was maintained > 400 sec. The proximal aortic arch was cannulated with a 20 F aortic cannula for arterial inflow. Venous cannulation was performed via the right atrial appendage using a two-staged venous cannula. An antegrade cardioplegia/vent cannula was inserted into the mid-ascending aorta. Aortic occlusion was performed with a single cross-clamp. Systemic cooling to 32 degrees Centigrade and topical cooling of the heart with iced saline were used. Hyperkalemic antegrade cold blood cardioplegia was used to induce diastolic arrest and was then given at about 20 minute intervals throughout the period of arrest to maintain myocardial temperature at or below 10 degrees centigrade. A temperature probe was inserted into the interventricular septum and an insulating pad was placed in the pericardium.   Left internal mammary artery harvest:  The left side of the sternum was retracted using the Rultract retractor. The left internal mammary artery was harvested as a pedicle graft. All side branches were clipped. It was a medium-sized vessel of good quality with excellent blood flow but not very long due to her short sternum. It was ligated distally and divided. It was sprayed with topical papaverine solution to prevent vasospasm.   Endoscopic vein harvest:  The right greater saphenous vein was harvested endoscopically through a 2 cm incision medial to the right knee. It was harvested from the upper thigh to below the knee. It was a medium-sized vein of good quality in the upper thigh but the remainder was small but good quality. The side branches were all ligated with 4-0 silk ties.    Coronary arteries:  The coronary arteries were  examined.  LAD:  small and intramyocardial throughout the proximal and mid portions. No distal disease. The diagonal was as large as the LAD with no distal disease. LCX:  OM1 was a small to medium caliber vessel that was visible proximally and then became intramyocardial. The OM2 was very small where it could be visualized. RCA:  Large vessel with segmental proximal to mid vessel disease. Since the vein was small and the RCA system large with only 60% stenosis and RFR of 0.94 I decided not to graft it.   Grafts:  LIMA to the LAD: 1.75 mm. It was sewn end to side using 8-0 prolene continuous suture. SVG to diagonal:  1.75 mm. It was sewn end to side using 7-0 prolene continuous suture. SVG to OM1:  1.6 mm. It was sewn end to side using 7-0 prolene continuous suture.   The proximal vein graft anastomoses were performed to the mid-ascending aorta using continuous 6-0 prolene suture. Graft markers were placed around the proximal anastomoses.  Pulmonary vein isolation ablation:  Performed on bypass. The SVC was mobilized from the right PA down to the right atrium. The oblique sinus was developed. The rubber catheter was passed through both sinuses from left to right. The Encompass clamp was attached to the magnetic ends and passes through the sinuses and around the pulmonary veins. The clamp was closed taking care not to pinch the LAA or RA. We performed 3 sets of lesions with 2 firings in each set. The clamp was removed.   Ligation of left atrial appendage:  The base of the appendage was measured and a 35 mm Atricure Atriclip Pro 2 was chosen. This was placed across the base of the LAA without difficulty.      Completion:  The patient was rewarmed to 37 degrees Centigrade. The clamp was removed from the LIMA pedicle and there was rapid warming of the septum and return of ventricular fibrillation. The crossclamp was removed with a time of 125 minutes. There was spontaneous return of sinus  rhythm. The distal and proximal anastomoses were checked for hemostasis. The position of the grafts was satisfactory. Two temporary epicardial pacing wires were placed on the right atrium and two on the right ventricle. The patient was weaned from CPB without difficulty on no inotropes. CPB time was 176 minutes. Cardiac output was 4 LPM. LV systolic function was unchanged from preop with an EF of 50-55%. Heparin was fully reversed with protamine and the aortic and venous cannulas removed. Hemostasis was achieved. Mediastinal and left pleural drainage tubes were placed. The sternum was closed with double #6 stainless steel wires. The fascia was closed with continuous # 1 vicryl suture. The subcutaneous tissue was closed with 2-0 vicryl continuous suture. The skin was closed with 3-0 vicryl subcuticular suture. All sponge, needle, and instrument counts were reported correct at the end of the case. Dry sterile dressings were placed over the incisions and around the chest tubes which were connected to pleurevac suction. The patient was then transported to the surgical intensive care unit in stable condition.

## 2022-10-08 NOTE — Anesthesia Procedure Notes (Signed)
Arterial Line Insertion Start/End3/28/2024 6:50 AM, 10/08/2022 7:00 AM Performed by: Gaylene Brooks, CRNA, CRNA  Patient location: Pre-op. Preanesthetic checklist: patient identified, IV checked, site marked, risks and benefits discussed, surgical consent, monitors and equipment checked, pre-op evaluation, timeout performed and anesthesia consent Lidocaine 1% used for infiltration and patient sedated Right, radial was placed Catheter size: 20 G Hand hygiene performed  and maximum sterile barriers used  Allen's test indicative of satisfactory collateral circulation Attempts: 1 Procedure performed without using ultrasound guided technique. Ultrasound Notes:anatomy identified, needle tip was noted to be adjacent to the nerve/plexus identified and no ultrasound evidence of intravascular and/or intraneural injection Following insertion, Biopatch and dressing applied. Post procedure assessment: normal  Patient tolerated the procedure well with no immediate complications.

## 2022-10-08 NOTE — Anesthesia Procedure Notes (Signed)
Central Venous Catheter Insertion Performed by: Roderic Palau, MD, anesthesiologist Start/End3/28/2024 6:50 AM, 10/08/2022 7:05 AM Patient location: Pre-op. Preanesthetic checklist: patient identified, IV checked, site marked, risks and benefits discussed, surgical consent, monitors and equipment checked, pre-op evaluation, timeout performed and anesthesia consent Position: Trendelenburg Lidocaine 1% used for infiltration and patient sedated Hand hygiene performed , maximum sterile barriers used  and Seldinger technique used Catheter size: 9 Fr Total catheter length 10. Central line was placed.MAC introducer Procedure performed using ultrasound guided technique. Ultrasound Notes:anatomy identified, needle tip was noted to be adjacent to the nerve/plexus identified, no ultrasound evidence of intravascular and/or intraneural injection and image(s) printed for medical record Attempts: 1 Following insertion, line sutured, dressing applied and Biopatch. Post procedure assessment: blood return through all ports, free fluid flow and no air  Patient tolerated the procedure well with no immediate complications.

## 2022-10-08 NOTE — Transfer of Care (Signed)
Immediate Anesthesia Transfer of Care Note  Patient: [name]  Procedure(s) Performed: CORONARY ARTERY BYPASS GRAFTING (CABG) X3 USING LEFT INTERNAL MAMMARY ARTERY AND RIGHT GREATER SAPHENOUS VEIN HARVESTED ENDOSCOPICALLY (Chest) PULMONARY VEIN ABLATION USING ATRICURE ISOLATOR SYNERGY ENCOMPASS OLH (Chest) TRANSESOPHAGEAL ECHOCARDIOGRAM CLIPPING OF ATRIAL APPENDAGE USING ATRICURE ATRICLIP PRO2 SIZE 35 (Chest)  Patient Location: ICU  Anesthesia Type:General  Level of Consciousness: Patient remains intubated per anesthesia plan  Airway & Oxygen Therapy: Patient remains intubated per anesthesia plan and Patient placed on Ventilator (see vital sign flow sheet for setting)  Post-op Assessment: Report given to RN and Post -op Vital signs reviewed and stable  Post vital signs: Reviewed and stable  Last Vitals:  Vitals Value Taken Time  BP 128/85 10/08/22 1507  Temp 36.9 C 10/08/22 1515  Pulse 99 10/08/22 1515  Resp 16 10/08/22 1515  SpO2 95 % 10/08/22 1515  Vitals shown include unvalidated device data.  Last Pain:  Vitals:   10/08/22 0605  TempSrc: Oral  PainSc: 0-No pain         Complications: No notable events documented.

## 2022-10-08 NOTE — Anesthesia Postprocedure Evaluation (Signed)
Anesthesia Post Note  Patient: [name]  Procedure(s) Performed: CORONARY ARTERY BYPASS GRAFTING (CABG) X3 USING LEFT INTERNAL MAMMARY ARTERY AND RIGHT GREATER SAPHENOUS VEIN HARVESTED ENDOSCOPICALLY (Chest) PULMONARY VEIN ABLATION USING ATRICURE ISOLATOR SYNERGY ENCOMPASS OLH (Chest) TRANSESOPHAGEAL ECHOCARDIOGRAM CLIPPING OF ATRIAL APPENDAGE USING ATRICURE ATRICLIP PRO2 SIZE 35 (Chest)     Patient location during evaluation: SICU Anesthesia Type: General Level of consciousness: sedated Pain management: pain level controlled Vital Signs Assessment: post-procedure vital signs reviewed and stable Respiratory status: patient remains intubated per anesthesia plan Cardiovascular status: stable Postop Assessment: no apparent nausea or vomiting Anesthetic complications: no  No notable events documented.  Last Vitals:  Vitals:   10/08/22 1507 10/08/22 1522  BP: 128/85 121/67  Pulse: 98 95  Resp: 16 20  Temp: 36.8 C 36.9 C  SpO2: 98% 96%    Last Pain:  Vitals:   10/08/22 0605  TempSrc: Oral  PainSc: 0-No pain                 Eriverto Byrnes,W. EDMOND

## 2022-10-08 NOTE — Brief Op Note (Addendum)
10/08/2022  10:30 AM  PATIENT:  Lorenz Coaster  52 y.o. female  PRE-OPERATIVE DIAGNOSIS:  Coronary Artery Disease  POST-OPERATIVE DIAGNOSIS:  Coronary Artery Disease  PROCEDURE:   CORONARY ARTERY BYPASS GRAFTING x 3 LIMA-LAD SVG-D1 SVG-OM    Pulmonary vein isolation ablation  Vein harvest time: 59min Vein prep time: 46min  PULMONARY VEIN ABLATION   TRANSESOPHAGEAL ECHOCARDIOGRAM   SURGEON:  Gaye Pollack, MD - Primary  ASSISTANT: Arlan Organ, MD  PHYSICIAN ASSISTANT: Roddenberry   ANESTHESIA:   general  EBL:  232ml  BLOOD ADMINISTERED:none  DRAINS:  Left pleural and mediastinal drains    LOCAL MEDICATIONS USED:  NONE  COUNTS: Correct  DICTATION: .Dragon Dictation  PLAN OF CARE: Admit to inpatient   PATIENT DISPOSITION:  ICU - intubated and hemodynamically stable.   Delay start of Pharmacological VTE agent (>24hrs) due to surgical blood loss or risk of bleeding: yes

## 2022-10-08 NOTE — Anesthesia Procedure Notes (Signed)
Central Venous Catheter Insertion Performed by: Roderic Palau, MD, anesthesiologist Start/End3/28/2024 6:50 AM, 10/08/2022 7:05 AM Patient location: Pre-op. Preanesthetic checklist: patient identified, IV checked, site marked, risks and benefits discussed, surgical consent, monitors and equipment checked, pre-op evaluation, timeout performed and anesthesia consent Hand hygiene performed  and maximum sterile barriers used  PA cath was placed.Swan type:thermodilution PA Cath depth:50 Procedure performed without using ultrasound guided technique. Attempts: 1 Patient tolerated the procedure well with no immediate complications.

## 2022-10-08 NOTE — Anesthesia Procedure Notes (Addendum)
Procedure Name: Intubation Date/Time: 10/08/2022 7:48 AM  Performed by: Gaylene Brooks, CRNAPre-anesthesia Checklist: Patient identified, Emergency Drugs available, Suction available and Patient being monitored Patient Re-evaluated:Patient Re-evaluated prior to induction Oxygen Delivery Method: Circle System Utilized Preoxygenation: Pre-oxygenation with 100% oxygen Induction Type: IV induction Ventilation: Mask ventilation without difficulty Laryngoscope Size: Mac and 3 Grade View: Grade II Tube type: Oral Tube size: 8.0 mm Number of attempts: 1 Airway Equipment and Method: Stylet Placement Confirmation: ETT inserted through vocal cords under direct vision, positive ETCO2 and breath sounds checked- equal and bilateral Secured at: 22 cm Tube secured with: Tape Dental Injury: Teeth and Oropharynx as per pre-operative assessment  Comments: Performed by Lanna Poche

## 2022-10-08 NOTE — Interval H&P Note (Signed)
History and Physical Interval Note:  10/08/2022 6:53 AM  [name]  has presented today for surgery, with the diagnosis of CAD.  The various methods of treatment have been discussed with the patient and family. After consideration of risks, benefits and other options for treatment, the patient has consented to  Procedure(s): CORONARY ARTERY BYPASS GRAFTING (CABG) (N/A) PULMONARY VEIN ABLATION (N/A) TRANSESOPHAGEAL ECHOCARDIOGRAM (N/A) as a surgical intervention.  The patient's history has been reviewed, patient examined, no change in status, stable for surgery.  I have reviewed the patient's chart and labs.  Questions were answered to the patient's satisfaction.     Gaye Pollack

## 2022-10-08 NOTE — Hospital Course (Addendum)
PCP is [name], MD Referring Provider is End, Harrell Gave, MD  History of Present Illness:   The patient is a 52 year old woman with a history of hyperlipidemia, hypertension, ongoing 1 pack/day smoking, fibromyalgia, rheumatoid arthritis, GERD, and anxiety who has had episodes of tachypalpitations and tachycardia for the past few years.  She said that she had an EKG done during one of these episodes when she went to the fire station and she was told that she was in atrial fibrillation.  She also reports a history of exertional substernal chest discomfort radiating to her left shoulder as well as exertional shortness of breath and fatigue.  Bending over to lift things up also brings on the symptoms.  A 2D echocardiogram on 07/20/2022 showed a moderately decreased left ventricular ejection fraction of 35 to 40% with mild global hypokinesis and moderate septal hypokinesis.  There was mild to moderate mitral regurgitation.  Her previous echocardiogram in March 2021 showed a left ventricular ejection fraction of 50 to 55% with trivial mitral regurgitation.  She underwent a calcium scoring CT on 07/31/2022 showing a coronary calcium score of 909 which was the 99th percentile.  CT FFR showed a flow-limiting stenosis in a large second diagonal branch.  Cardiac catheterization on 09/07/2022 showed sequential 90% and 70% lesions in the LAD flanking an aneurysmal segment where a large second diagonal branch arises from.  The second diagonal branch had about 70% proximal stenosis.  The right coronary artery has a segmental 60% concentric mid vessel stenosis.  The RFR in the mid RCA was 0.94.  The RFR in the LAD was 0.46.  Left heart filling pressures were mildly elevated with an LVEDP of 17 and pulm capillary wedge of 13.  Right heart pressures were normal.   She continues to smoke 1 pack of cigarettes per day and has since she was a teenager.  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  I agree that  coronary artery bypass graft surgery is the best treatment for resolution of her symptoms and to preserve myocardium.  She has had some episodes of tachycardia and was told at a fire station that she was atrial fibrillation previously.  All of her ECGs in EPIC shows sinus rhythm.  She has had a lot of problems with these tachypalpitations that have been very symptomatic and is on Eliquis with the presumption that she may be having atrial fibrillation.  I think it would be worthwhile doing a pulmonary vein ablation to decrease the risk of atrial fibrillation as well as clipping her left atrial appendage.  I reviewed the cardiac catheterization and echo images with her and answered her questions.  I would plan to use a left internal mammary graft for the LAD and vein grafts for her diagonal and right coronary artery.  I do not think it would be a good idea to attempt bilateral internal mammary artery grafts in a relatively short, obese woman who is a heavy smoker.  Her radial artery pulses are relatively weak and her arms are short and I doubt that her radial arteries would be ideal grafts.   I discussed the operative procedure with the patient including alternatives, benefits and risks; including but not limited to bleeding, blood transfusion, infection, stroke, myocardial infarction, graft failure, heart block requiring a permanent pacemaker, organ dysfunction, and death.  I also discussed the importance of smoking cessation and preventing further progression of coronary disease in her own native arteries or bypass grafts.  Janet  Elmer [name] understands and agrees to proceed.     Hospital Course: [name] was admitted for elective surgery on 10/08/2022. She was taken to the OR where CABG x 4 was carried out without complication.  Following the procedure, she separated from cardiopulmonary bypass without difficulty and was transferred tot the ICU in stable condition.  Vital signs and hemodynamics remained  stable.  She was weaned from the ventilator and extubated at 830 on the day of surgery.  Neo-Synephrine was weaned on the first postoperative day. Blood pressure was supported with oral midodrine.  Diuresis was initiated for expected  volume excess.  The monitoring lines were removed on postop day 1.  Chest tubes were left in place for drainage.  She was started on a nicotine patch per her request.  Mobilized routinely.  Chest tube drainage subsided allowing chest tube removal on postop day 2.  Supplemental oxygen requirement gradually decline.  She was transfused with 1 unit of packed red blood cells on postop day 3 for hemoglobin of 6.9 with appropriate response in blood counts. Diuresis was initiated. [name] was ready for transfer to [facility] on post-op day 4.She progressed with mobility and diuresis continued for significant volume excess. The supplemental oxygen was weaned off.

## 2022-10-09 ENCOUNTER — Inpatient Hospital Stay (HOSPITAL_COMMUNITY): Payer: BC Managed Care – PPO

## 2022-10-09 LAB — BASIC METABOLIC PANEL
Anion gap: 7 (ref 5–15)
Anion gap: 9 (ref 5–15)
BUN: 8 mg/dL (ref 6–20)
BUN: 10 mg/dL (ref 6–20)
CO2: 23 mmol/L (ref 22–32)
CO2: 24 mmol/L (ref 22–32)
Calcium: 7.2 mg/dL — ABNORMAL LOW (ref 8.9–10.3)
Calcium: 7.8 mg/dL — ABNORMAL LOW (ref 8.9–10.3)
Chloride: 105 mmol/L (ref 98–111)
Chloride: 100 mmol/L (ref 98–111)
Creatinine, Ser: 0.71 mg/dL (ref 0.44–1.00)
Creatinine, Ser: 0.74 mg/dL (ref 0.44–1.00)
GFR, Estimated: >60 mL/min (ref >60)
GFR, Estimated: >60 mL/min (ref >60)
Glucose, Bld: 107 mg/dL — ABNORMAL HIGH (ref 70–99)
Glucose, Bld: 135 mg/dL — ABNORMAL HIGH (ref 70–99)
Potassium: 3.8 mmol/L (ref 3.5–5.1)
Potassium: 3.9 mmol/L (ref 3.5–5.1)
Sodium: 135 mmol/L (ref 135–145)
Sodium: 133 mmol/L — ABNORMAL LOW (ref 135–145)

## 2022-10-09 LAB — GLUCOSE, CAPILLARY
Glucose-Capillary: 103 mg/dL — ABNORMAL HIGH (ref 70–99)
Glucose-Capillary: 109 mg/dL — ABNORMAL HIGH (ref 70–99)
Glucose-Capillary: 109 mg/dL — ABNORMAL HIGH (ref 70–99)
Glucose-Capillary: 92 mg/dL (ref 70–99)
Glucose-Capillary: 112 mg/dL — ABNORMAL HIGH (ref 70–99)
Glucose-Capillary: 123 mg/dL — ABNORMAL HIGH (ref 70–99)

## 2022-10-09 LAB — POCT I-STAT 7, (LYTES, BLD GAS, ICA,H+H)
Acid-base deficit: 1 mmol/L (ref 0.0–2.0)
Bicarbonate: 25 mmol/L (ref 20.0–28.0)
Calcium, Ion: 1.15 mmol/L (ref 1.15–1.40)
HCT: 25 % — ABNORMAL LOW (ref 36.0–46.0)
Hemoglobin: 8.5 g/dL — ABNORMAL LOW (ref 12.0–15.0)
O2 Saturation: 96 %
Patient temperature: 99.7
Potassium: 4.1 mmol/L (ref 3.5–5.1)
Sodium: 141 mmol/L (ref 135–145)
TCO2: 26 mmol/L (ref 22–32)
pCO2 arterial: 45.9 mmHg (ref 32–48)
pH, Arterial: 7.347 — ABNORMAL LOW (ref 7.35–7.45)
pO2, Arterial: 87 mmHg (ref 83–108)

## 2022-10-09 LAB — CBC
HCT: 23.2 % — ABNORMAL LOW (ref 36.0–46.0)
HCT: 23.7 % — ABNORMAL LOW (ref 36.0–46.0)
Hemoglobin: 7.4 g/dL — ABNORMAL LOW (ref 12.0–15.0)
Hemoglobin: 7.5 g/dL — ABNORMAL LOW (ref 12.0–15.0)
MCH: 30.6 pg (ref 26.0–34.0)
MCH: 30.5 pg (ref 26.0–34.0)
MCHC: 31.9 g/dL (ref 30.0–36.0)
MCHC: 31.6 g/dL (ref 30.0–36.0)
MCV: 95.9 fL (ref 80.0–100.0)
MCV: 96.3 fL (ref 80.0–100.0)
Platelets: 116 10*3/uL — ABNORMAL LOW (ref 150–400)
Platelets: 150 10*3/uL (ref 150–400)
RBC: 2.42 MIL/uL — ABNORMAL LOW (ref 3.87–5.11)
RBC: 2.46 MIL/uL — ABNORMAL LOW (ref 3.87–5.11)
RDW: 13.8 % (ref 11.5–15.5)
RDW: 14 % (ref 11.5–15.5)
WBC: 12 10*3/uL — ABNORMAL HIGH (ref 4.0–10.5)
WBC: 14.8 10*3/uL — ABNORMAL HIGH (ref 4.0–10.5)
nRBC: 0 % (ref 0.0–0.2)
nRBC: 0 % (ref 0.0–0.2)

## 2022-10-09 LAB — MAGNESIUM
Magnesium: 2.4 mg/dL (ref 1.7–2.4)
Magnesium: 2.2 mg/dL (ref 1.7–2.4)

## 2022-10-09 MED ORDER — ENOXAPARIN SODIUM 40 MG/0.4ML IJ SOSY
40.0000 mg | PREFILLED_SYRINGE | Freq: Every day | INTRAMUSCULAR | Status: DC
  Administered 2022-10-09 – 2022-10-14 (×6): 40 mg via SUBCUTANEOUS
  Filled 2022-10-09 (×6): qty 0.4

## 2022-10-09 MED ORDER — FUROSEMIDE 10 MG/ML IJ SOLN
40.0000 mg | Freq: Once | INTRAMUSCULAR | Status: AC
  Administered 2022-10-09: 40 mg via INTRAVENOUS
  Filled 2022-10-09: qty 4

## 2022-10-09 MED ORDER — POTASSIUM CHLORIDE CRYS ER 20 MEQ PO TBCR
20.0000 meq | EXTENDED_RELEASE_TABLET | Freq: Two times a day (BID) | ORAL | Status: AC
  Administered 2022-10-09 (×2): 20 meq via ORAL
  Filled 2022-10-09 (×2): qty 1

## 2022-10-09 MED ORDER — LIP MEDEX EX OINT
TOPICAL_OINTMENT | CUTANEOUS | Status: DC | PRN
  Filled 2022-10-09: qty 7

## 2022-10-09 MED ORDER — INSULIN ASPART 100 UNIT/ML IJ SOLN: 0.0000 [IU] | INTRAMUSCULAR | Status: DC

## 2022-10-09 MED ORDER — NICOTINE 21 MG/24HR TD PT24
21.0000 mg | MEDICATED_PATCH | Freq: Every day | TRANSDERMAL | Status: DC
  Administered 2022-10-09 – 2022-10-12 (×4): 21 mg via TRANSDERMAL
  Filled 2022-10-09 (×4): qty 1

## 2022-10-09 MED ORDER — FE FUM-VIT C-VIT B12-FA 460-60-0.01-1 MG PO CAPS
1.0000 | ORAL_CAPSULE | Freq: Every day | ORAL | Status: DC
  Administered 2022-10-09 – 2022-10-15 (×7): 1 via ORAL
  Filled 2022-10-09 (×7): qty 1

## 2022-10-09 MED ORDER — MIDODRINE HCL 5 MG PO TABS
10.0000 mg | ORAL_TABLET | Freq: Three times a day (TID) | ORAL | Status: DC
  Administered 2022-10-09 – 2022-10-11 (×7): 10 mg via ORAL
  Filled 2022-10-09 (×7): qty 2

## 2022-10-09 MED FILL — Thrombin (Recombinant) For Soln 20000 Unit: CUTANEOUS | Qty: 1 | Status: AC

## 2022-10-09 NOTE — Procedures (Signed)
**  Late entry  Extubation Procedure Note  Patient Details:   Name: [name] DOB: 01-13-1971 MRN: [id number]   Airway Documentation:    Vent end date: 10/08/22 Vent end time: 2030   Evaluation  O2 sats: stable throughout Complications: No apparent complications Patient did tolerate procedure well. Bilateral Breath Sounds: Diminished   Patients ability to speak Yes  Pt suctioned oral and ETT. Able to lift head off pillow and stick out tongue. Audible cuff leak present. Extubated to [facility]. No stridor noted. Pt able to say name and DOB.    Brenton Grills 10/09/2022, 3:44 AM

## 2022-10-09 NOTE — Progress Notes (Signed)
1 Day Post-Op Procedure(s) (LRB): CORONARY ARTERY BYPASS GRAFTING (CABG) X3 USING LEFT INTERNAL MAMMARY ARTERY AND RIGHT GREATER SAPHENOUS VEIN HARVESTED ENDOSCOPICALLY (N/A) PULMONARY VEIN ABLATION USING ATRICURE ISOLATOR SYNERGY ENCOMPASS [facility] (N/A) TRANSESOPHAGEAL ECHOCARDIOGRAM (N/A) CLIPPING OF ATRIAL APPENDAGE USING ATRICURE ATRICLIP PRO2 SIZE 35 (N/A) Subjective: No complaints  Objective: Vital signs in last 24 hours: Temp:  [98.2 F (36.8 C)-99.7 F (37.6 C)] 98.6 F (37 C) (03/29 0630) Pulse Rate:  [87-99] 93 (03/29 0630) Cardiac Rhythm: Normal sinus rhythm (03/29 0430) Resp:  [13-29] 19 (03/29 0630) BP: (88-128)/(59-85) 97/67 (03/29 0600) SpO2:  [82 %-100 %] 94 % (03/29 0630) Arterial Line BP: (79-152)/(47-85) 93/53 (03/29 0630) FiO2 (%):  [40 %-50 %] 40 % (03/28 2003)  Hemodynamic parameters for last 24 hours: PAP: (23-41)/(6-29) 25/15 CO:  [3 L/min-5.9 L/min] 5.3 L/min CI:  [1.7 L/min/m2-3.2 L/min/m2] 2.9 L/min/m2  Intake/Output from previous day: 03/28 0701 - 03/29 0700 In: 7247.2 [P.O.:960; I.V.:4501.3; Blood:479; IV [id number] Out: [id number] [Urine:1680; Blood:800; Chest Tube:740] Intake/Output this shift: Total I/O In: 2377.7 [P.O.:960; I.V.:691.3; IV Piggyback:726.5] Out: 920 [Urine:420; Chest Tube:500]  General appearance: alert and cooperative Neurologic: intact Heart: regular rate and rhythm Lungs: clear to auscultation bilaterally Extremities: edema mild Wound: dressing dry  Lab Results: Recent Labs    10/08/22 2047 10/09/22 0434  WBC 16.8* 12.0*  HGB 9.1* 7.4*  HCT 27.3* 23.2*  PLT 146* 116*   BMET:  Recent Labs    10/08/22 2047 10/09/22 0434  NA 139 135  K 4.2 3.8  CL 106 105  CO2 25 23  GLUCOSE 128* 107*  BUN 8 8  CREATININE 0.71 0.71  CALCIUM 7.1* 7.2*    PT/INR:  Recent Labs    10/08/22 1519  LABPROT 19.8*  INR 1.7*   ABG    Component Value Date/Time   PHART 7.347 (L) 10/08/2022 2025   HCO3 25.0 10/08/2022 2025    TCO2 26 10/08/2022 2025   ACIDBASEDEF 1.0 10/08/2022 2025   O2SAT 96 10/08/2022 2025   CBG (last 3)  Recent Labs    10/08/22 2356 10/09/22 0102 10/09/22 0306  GLUCAP 108* 103* 109*   CXR: ok  ECG: sinus, no acute changes  Assessment/Plan: S/P Procedure(s) (LRB): CORONARY ARTERY BYPASS GRAFTING (CABG) X3 USING LEFT INTERNAL MAMMARY ARTERY AND RIGHT GREATER SAPHENOUS VEIN HARVESTED ENDOSCOPICALLY (N/A) PULMONARY VEIN ABLATION USING ATRICURE ISOLATOR SYNERGY ENCOMPASS [facility]H (N/A) TRANSESOPHAGEAL ECHOCARDIOGRAM (N/A) CLIPPING OF ATRIAL APPENDAGE USING ATRICURE ATRICLIP PRO2 SIZE 35 (N/A)  POD 1  Hemodynamically stable on low dose neo. Hold Lopressor until stable off neo.  Volume excess: will start diuresis once off neo. 8 kg over preop.   DC swan, arterial line. Chest tubes still draining thin serosanguinous fluid. Will keep in this morning.  Smoker: start nicotine patch at her request and work on IS.  Acute blood loss and dilutional anemia. Will start iron.  Glucose under control: no hx of DM and preop Hgb A1c 5.8 on no meds. Will continue SSI today but should be able to stop quickly.   LOS: 1 day    [name] 10/09/2022

## 2022-10-10 ENCOUNTER — Encounter (HOSPITAL_COMMUNITY): Payer: Self-pay | Admitting: Surgery

## 2022-10-10 ENCOUNTER — Inpatient Hospital Stay (HOSPITAL_COMMUNITY): Payer: BC Managed Care – PPO

## 2022-10-10 DIAGNOSIS — Z951 Presence of aortocoronary bypass graft: Secondary | ICD-10-CM | POA: Diagnosis not present

## 2022-10-10 LAB — BASIC METABOLIC PANEL
Anion gap: 9 (ref 5–15)
BUN: 10 mg/dL (ref 6–20)
CO2: 27 mmol/L (ref 22–32)
Calcium: 8.1 mg/dL — ABNORMAL LOW (ref 8.9–10.3)
Chloride: 97 mmol/L — ABNORMAL LOW (ref 98–111)
Creatinine, Ser: 0.58 mg/dL (ref 0.44–1.00)
GFR, Estimated: >60 mL/min (ref >60)
Glucose, Bld: 117 mg/dL — ABNORMAL HIGH (ref 70–99)
Potassium: 4.8 mmol/L (ref 3.5–5.1)
Sodium: 133 mmol/L — ABNORMAL LOW (ref 135–145)

## 2022-10-10 LAB — CBC
HCT: 20.7 % — ABNORMAL LOW (ref 36.0–46.0)
Hemoglobin: 7 g/dL — ABNORMAL LOW (ref 12.0–15.0)
MCH: 31.8 pg (ref 26.0–34.0)
MCHC: 33.8 g/dL (ref 30.0–36.0)
MCV: 94.1 fL (ref 80.0–100.0)
Platelets: 126 10*3/uL — ABNORMAL LOW (ref 150–400)
RBC: 2.2 MIL/uL — ABNORMAL LOW (ref 3.87–5.11)
RDW: 14.1 % (ref 11.5–15.5)
WBC: 13.7 10*3/uL — ABNORMAL HIGH (ref 4.0–10.5)
nRBC: 0 % (ref 0.0–0.2)

## 2022-10-10 LAB — GLUCOSE, CAPILLARY
Glucose-Capillary: 129 mg/dL — ABNORMAL HIGH (ref 70–99)
Glucose-Capillary: 141 mg/dL — ABNORMAL HIGH (ref 70–99)
Glucose-Capillary: 127 mg/dL — ABNORMAL HIGH (ref 70–99)
Glucose-Capillary: 129 mg/dL — ABNORMAL HIGH (ref 70–99)
Glucose-Capillary: 122 mg/dL — ABNORMAL HIGH (ref 70–99)

## 2022-10-10 MED ORDER — FUROSEMIDE 10 MG/ML IJ SOLN
40.0000 mg | Freq: Every day | INTRAMUSCULAR | Status: DC
  Administered 2022-10-10: 40 mg via INTRAVENOUS
  Filled 2022-10-10: qty 4

## 2022-10-10 MED ORDER — FUROSEMIDE 10 MG/ML IJ SOLN: 40.0000 mg | Freq: Once | INTRAMUSCULAR | Status: DC

## 2022-10-10 MED ORDER — FUROSEMIDE 10 MG/ML IJ SOLN
40.0000 mg | Freq: Once | INTRAMUSCULAR | Status: AC
  Administered 2022-10-10: 40 mg via INTRAVENOUS
  Filled 2022-10-10: qty 4

## 2022-10-10 MED ORDER — LIDOCAINE 5 % EX PTCH
1.0000 | MEDICATED_PATCH | CUTANEOUS | Status: DC
  Administered 2022-10-10 – 2022-10-11 (×2): 1 via TRANSDERMAL
  Filled 2022-10-10 (×2): qty 1

## 2022-10-10 NOTE — Progress Notes (Signed)
TCTS Progress Note: 2 Days Post-Op Procedure(s) (LRB): CORONARY ARTERY BYPASS GRAFTING (CABG) X3 USING LEFT INTERNAL MAMMARY ARTERY AND RIGHT GREATER SAPHENOUS VEIN HARVESTED ENDOSCOPICALLY (N/A) PULMONARY VEIN ABLATION USING ATRICURE ISOLATOR SYNERGY ENCOMPASS [facility] (N/A) TRANSESOPHAGEAL ECHOCARDIOGRAM (N/A) CLIPPING OF ATRIAL APPENDAGE USING ATRICURE ATRICLIP PRO2 SIZE 35 (N/A)  LOS: 2 days   Doing well no issues Comfortable on [place]    Latest Ref Rng & Units 10/10/2022    4:40 AM 10/09/2022    5:22 PM 10/09/2022    4:34 AM  CBC  WBC 4.0 - 10.5 K/uL 13.7  14.8  12.0   Hemoglobin 12.0 - 15.0 g/dL 7.0  7.5  7.4   Hematocrit 36.0 - 46.0 % 20.7  23.7  23.2   Platelets 150 - 400 K/uL 126  150  116        Latest Ref Rng & Units 10/10/2022    4:40 AM 10/09/2022    5:22 PM 10/09/2022    4:34 AM  CMP  Glucose 70 - 99 mg/dL 117  135  107   BUN 6 - 20 mg/dL 10  10  8    Creatinine 0.44 - 1.00 mg/dL 0.58  0.74  0.71   Sodium 135 - 145 mmol/L 133  133  135   Potassium 3.5 - 5.1 mmol/L 4.8  3.9  3.8   Chloride 98 - 111 mmol/L 97  100  105   CO2 22 - 32 mmol/L 27  24  23    Calcium 8.9 - 10.3 mg/dL 8.1  7.8  7.2     ABG    Component Value Date/Time   PHART 7.347 (L) 10/08/2022 2025   PCO2ART 45.9 10/08/2022 2025   PO2ART 87 10/08/2022 2025   HCO3 25.0 10/08/2022 2025   TCO2 26 10/08/2022 2025   ACIDBASEDEF 1.0 10/08/2022 2025   O2SAT 96 10/08/2022 2025

## 2022-10-10 NOTE — Progress Notes (Signed)
TCTS Progress Note: 2 Days Post-Op Procedure(s) (LRB): CORONARY ARTERY BYPASS GRAFTING (CABG) X3 USING LEFT INTERNAL MAMMARY ARTERY AND RIGHT GREATER SAPHENOUS VEIN HARVESTED ENDOSCOPICALLY (N/A) PULMONARY VEIN ABLATION USING ATRICURE ISOLATOR SYNERGY ENCOMPASS OLH (N/A) TRANSESOPHAGEAL ECHOCARDIOGRAM (N/A) CLIPPING OF ATRIAL APPENDAGE USING ATRICURE ATRICLIP PRO2 SIZE 35 (N/A)  LOS: 2 days   POD 2 CAB  Doing well  On 5l 02 Cts no sig output Sitting up and talking not moving a whole lot Back pain lido patch  Has pulse in bilateral feet Some senation diff in vein harvest R leg  Meds:  ASA: yes DVT ppx: yes BB: yes  Lasix: 40 daily  Plan:  N add lido patch CV no issues Resp diuresing GI no issues, 2 d BM before GU: dc foley at 11pm HEME - HGB 7.0 - WILl diurese - hold on transfusion for now unless gets down to 6s or 02 need increases.  ID: no issues Endo no issues T/l/d DC central line DC chest tubes Keep pacing wires Dc foley 11pm      Latest Ref Rng & Units 10/10/2022    4:40 AM 10/09/2022    5:22 PM 10/09/2022    4:34 AM  CBC  WBC 4.0 - 10.5 K/uL 13.7  14.8  12.0   Hemoglobin 12.0 - 15.0 g/dL 7.0  7.5  7.4   Hematocrit 36.0 - 46.0 % 20.7  23.7  23.2   Platelets 150 - 400 K/uL 126  150  116        Latest Ref Rng & Units 10/10/2022    4:40 AM 10/09/2022    5:22 PM 10/09/2022    4:34 AM  CMP  Glucose 70 - 99 mg/dL 117  135  107   BUN 6 - 20 mg/dL 10  10  8    Creatinine 0.44 - 1.00 mg/dL 0.58  0.74  0.71   Sodium 135 - 145 mmol/L 133  133  135   Potassium 3.5 - 5.1 mmol/L 4.8  3.9  3.8   Chloride 98 - 111 mmol/L 97  100  105   CO2 22 - 32 mmol/L 27  24  23    Calcium 8.9 - 10.3 mg/dL 8.1  7.8  7.2     ABG    Component Value Date/Time   PHART 7.347 (L) 10/08/2022 2025   PCO2ART 45.9 10/08/2022 2025   PO2ART 87 10/08/2022 2025   HCO3 25.0 10/08/2022 2025   TCO2 26 10/08/2022 2025   ACIDBASEDEF 1.0 10/08/2022 2025   O2SAT 96 10/08/2022 2025

## 2022-10-10 NOTE — Consult Note (Addendum)
NAME:  [name], MRN:  [id number], DOB:  [birth year], LOS: 2 ADMISSION DATE:  10/08/2022, CONSULTATION DATE:  10/10/22 REFERRING MD:  Cyndia Bent, CHIEF COMPLAINT:  Chest pain   History of Present Illness:  52 year old woman w/ hx of HLD, HTN, smoking, RA< GERD presenting with exertional chest pain found to have severe multivessel CAD.  On 10/08/22 she underwent CABG x 3 (LIMA-LAD, SVG-diag-SVG-OM1) with Dr. Cyndia Bent.  Postoperative course complicated by persistent O2 need for which PCCM is consulted.  Patient states she coughed up "a lot" last night and feels better this AM.  Still feels like there is phlegm in back of her throat.  She is using IS, braced coughing.  Currently up in chair.  On no home O2.  Still smoking.  Pain under reasonable control.  Pertinent  Medical History  Anxiety RA Smoking GERD Fibromyalgia  Significant Hospital Events: Including procedures, antibiotic start and stop dates in addition to other pertinent events     Interim History / Subjective:  Consulted Sats 99% on 5LPM  Objective   Blood pressure (!) 108/56, pulse (!) 101, temperature 99.5 F (37.5 C), temperature source Bladder, resp. rate (!) 30, height 5\' 2"  (1.575 m), weight 90.6 kg, SpO2 94 %. PAP: (21)/(14) 21/14      Intake/Output Summary (Last 24 hours) at 10/10/2022 0826 Last data filed at 10/10/2022 0600 Gross per 24 hour  Intake 740.74 ml  Output 1615 ml  Net -874.26 ml   Filed Weights   10/08/22 0605 10/09/22 0700 10/10/22 0640  Weight: 82.1 kg 91.2 kg 90.6 kg    Examination: General: no distres HENT: MMM, trachea midline Lungs: some crackles, otherwise clear Cardiovascular: borderline tachy, ext warm Abdomen: soft, hypoactive BS Extremities: no edema, harvest site wrapped Neuro: moves ext to command Skin: sternotomy site CDI, L chest tube serous output, no tidaling or air leak  Mild hyponatremia, Cr okay Mild leukocytosis, Hgb down slightly to 7 CXR L chest tube in place but  still small effusions, atelectasis vs pulmonary edema worsening.  Less likely bilateral lower lobe pneumonia.  Resolved Hospital Problem list   N/A  Assessment & Plan:  Symptomatic multivessel CAD s/p CABG x 3, R SVG harvest, left atrial appendage ligation, pulmonary vein isolation ablation 3/28 Postoperative hypoxemia- related to atelectasis, fluid shifts, less likely pneumonia.  Dropped her O2 to 3LPM and sats holding mid-90s.  CT chest 07/31/22 showing some mosaicism, no significant emphyema or signs of ILD.  Hopefully with mobility, pulmonary toileting and diuresis this will improve. Hx anxiety, depression, fibromyalgia, HLD, smoking, GERD Hx symptomatic afib- on eliquis and rate control agents PTA. S/P procedures above  - Lasix x 2 ordered today - Add flutter to IS - Up in chair, encourage deep breathing, braced coughing - Wean O2 for sats > 90% - Check Pct, sputum culture although suspect low yield - Chest drain, GDMT, and pain management per primary - Will follow with you through weekend until Dr. Cyndia Bent returns Monday.  Best Practice (right click and "Reselect all SmartList Selections" daily)   Diet/type: Regular consistency (see orders) DVT prophylaxis: LMWH GI prophylaxis: PPI Lines: Central line Foley:  Yes, and it is still needed Code Status:  full code Last date of multidisciplinary goals of care discussion [per primary]  Labs   CBC: Recent Labs  Lab 10/08/22 1519 10/08/22 1520 10/08/22 2025 10/08/22 2047 10/09/22 0434 10/09/22 1722 10/10/22 0440  WBC 21.6*  --   --  16.8* 12.0* 14.8* 13.7*  HGB 9.1*   < > 8.5* 9.1* 7.4* 7.5* 7.0*  HCT 27.6*   < > 25.0* 27.3* 23.2* 23.7* 20.7*  MCV 94.8  --   --  92.5 95.9 96.3 94.1  PLT 139*  --   --  146* 116* 150 126*   < > = values in this interval not displayed.    Basic Metabolic Panel: Recent Labs  Lab 10/06/22 1138 10/08/22 0754 10/08/22 1343 10/08/22 1418 10/08/22 2025 10/08/22 2047 10/09/22 0434  10/09/22 1722 10/10/22 0440  NA 137   < > 141   < > 141 139 135 133* 133*  K 4.1   < > 4.1   < > 4.1 4.2 3.8 3.9 4.8  CL 102   < > 104  --   --  106 105 100 97*  CO2 22  --   --   --   --  25 23 24 27   GLUCOSE 97   < > 113*  --   --  128* 107* 135* 117*  BUN 9   < > 6  --   --  8 8 10 10   CREATININE 0.69   < > 0.50  --   --  0.71 0.71 0.74 0.58  CALCIUM 9.3  --   --   --   --  7.1* 7.2* 7.8* 8.1*  MG  --   --   --   --   --  2.9* 2.4 2.2  --    < > = values in this interval not displayed.   GFR: Estimated Creatinine Clearance: 86.1 mL/min (by C-G formula based on SCr of 0.58 mg/dL). Recent Labs  Lab 10/08/22 2047 10/09/22 0434 10/09/22 1722 10/10/22 0440  WBC 16.8* 12.0* 14.8* 13.7*    Liver Function Tests: Recent Labs  Lab 10/06/22 1138  AST 18  ALT 15  ALKPHOS 90  BILITOT 0.4  PROT 6.4*  ALBUMIN 3.7   No results for input(s): "LIPASE", "AMYLASE" in the last 168 hours. No results for input(s): "AMMONIA" in the last 168 hours.  ABG    Component Value Date/Time   PHART 7.347 (L) 10/08/2022 2025   PCO2ART 45.9 10/08/2022 2025   PO2ART 87 10/08/2022 2025   HCO3 25.0 10/08/2022 2025   TCO2 26 10/08/2022 2025   ACIDBASEDEF 1.0 10/08/2022 2025   O2SAT 96 10/08/2022 2025     Coagulation Profile: Recent Labs  Lab 10/06/22 1138 10/08/22 1519  INR 1.1 1.7*    Cardiac Enzymes: No results for input(s): "CKTOTAL", "CKMB", "CKMBINDEX", "TROPONINI" in the last 168 hours.  HbA1C: Hgb A1c MFr Bld  Date/Time Value Ref Range Status  10/06/2022 11:38 AM 5.8 (H) 4.8 - 5.6 % Final    Comment:    (NOTE)         Prediabetes: 5.7 - 6.4         Diabetes: >6.4         Glycemic control for adults with diabetes: <7.0     CBG: Recent Labs  Lab 10/09/22 0825 10/09/22 1139 10/09/22 1559 10/09/22 2008 10/10/22 0437  GLUCAP 109* 92 112* 123* 129*    Review of Systems:    Positive Symptoms in bold:  Constitutional fevers, chills, weight loss, fatigue, anorexia,  malaise  Eyes decreased vision, double vision, eye irritation  Ears, Nose, Mouth, Throat sore throat, trouble swallowing, sinus congestion  Cardiovascular chest pain, paroxysmal nocturnal dyspnea, lower ext edema, palpitations   Respiratory SOB, cough, DOE, hemoptysis, wheezing  Gastrointestinal nausea,  vomiting, diarrhea  Genitourinary burning with urination, trouble urinating  Musculoskeletal joint aches, joint swelling, back pain  Integumentary  rashes, skin lesions  Neurological focal weakness, focal numbness, trouble speaking, headaches  Psychiatric depression, anxiety, confusion  Endocrine polyuria, polydipsia, cold intolerance, heat intolerance  Hematologic abnormal bruising, abnormal bleeding, unexplained nose bleeds  Allergic/Immunologic recurrent infections, hives, swollen lymph nodes     Past Medical History:  She,  has a past medical history of Anxiety, Arthralgia, Attention-deficit hyperactivity disorder, predominantly inattentive type, Back pain, Dysrhythmia, Fear of flying, Fibromyalgia, Gastroesophageal reflux disease, HPV in female, Joint pain, Localized edema, Neck pain, OA (osteoarthritis), Papillomavirus as the cause of diseases classified elsewhere, Primary insomnia, Pure hypercholesterolemia, Rheumatoid arthritis, seronegative, hand, unspecified laterality (HCC), Spondylolisthesis of lumbosacral region, Tobacco dependence, Unspecified inflammatory spondylopathy, cervical region ([place]), Vitamin B12 deficiency anemia, and Voice hoarseness.   Surgical History:   Past Surgical History:  Procedure Laterality Date   ABDOMINAL EXPOSURE N/A 06/28/2020   Procedure: ABDOMINAL EXPOSURE;  Surgeon: Marty Heck, MD;  Location: [facility] OR;  Service: Vascular;  Laterality: N/A;  anterior   ANTERIOR LUMBAR FUSION N/A 06/28/2020   Procedure: ANTERIOR LUMBAR INTERBODY FUSION LUMBAR FIVE-SACRAL ONE.;  Surgeon: Eustace Moore, MD;  Location: [place];  Service: Neurosurgery;  Laterality:  N/A;  anterior approach   BREAST ENHANCEMENT SURGERY     CARPAL TUNNEL RELEASE Left    CARPAL TUNNEL RELEASE Right 06/28/2020   Procedure: CARPAL TUNNEL RELEASE;  Surgeon: Eustace Moore, MD;  Location: [place];  Service: Neurosurgery;  Laterality: Right;   CERVICAL SPINE SURGERY     CHOLECYSTECTOMY     CLIPPING OF ATRIAL APPENDAGE N/A 10/08/2022   Procedure: CLIPPING OF ATRIAL APPENDAGE USING ATRICURE ATRICLIP PRO2 SIZE 35;  Surgeon: Gaye Pollack, MD;  Location: [place];  Service: Open Heart Surgery;  Laterality: N/A;   CORONARY ARTERY BYPASS GRAFT N/A 10/08/2022   Procedure: CORONARY ARTERY BYPASS GRAFTING (CABG) X3 USING LEFT INTERNAL MAMMARY ARTERY AND RIGHT GREATER SAPHENOUS VEIN HARVESTED ENDOSCOPICALLY;  Surgeon: Gaye Pollack, MD;  Location: [place];  Service: Open Heart Surgery;  Laterality: N/A;   FOOT SURGERY     INTRAVASCULAR PRESSURE WIRE/FFR STUDY N/A 09/07/2022   Procedure: INTRAVASCULAR PRESSURE WIRE/FFR STUDY;  Surgeon: Nelva Bush, MD;  Location: [place] [facility];  Service: Cardiovascular;  Laterality: N/A;   MANDIBLE SURGERY     MAZE N/A 10/08/2022   Procedure: PULMONARY VEIN ABLATION USING ATRICURE ISOLATOR SYNERGY ENCOMPASS [place];  Surgeon: Gaye Pollack, MD;  Location: [place];  Service: Open Heart Surgery;  Laterality: N/A;   RIGHT/LEFT HEART CATH AND CORONARY ANGIOGRAPHY N/A 09/07/2022   Procedure: RIGHT/LEFT HEART CATH AND CORONARY ANGIOGRAPHY;  Surgeon: Nelva Bush, MD;  Location: [place] [facility];  Service: Cardiovascular;  Laterality: N/A;   TEE WITHOUT CARDIOVERSION N/A 10/08/2022   Procedure: TRANSESOPHAGEAL ECHOCARDIOGRAM;  Surgeon: Gaye Pollack, MD;  Location: [facility] OR;  Service: Open Heart Surgery;  Laterality: N/A;   TONSILLECTOMY     TUBAL LIGATION     with ablation   TYMPANOSTOMY TUBE PLACEMENT       Social History:   reports that she has been smoking cigarettes. She has been smoking an average of 1 pack per day. She has never used smokeless  tobacco. She reports current alcohol use. She reports that she does not use drugs.   Family History:  Her family history includes Heart disease in her father; Hypertension in her father.   Allergies Allergies  Allergen Reactions   Zoloft [Sertraline] Other (See Comments)    Altered mental state     Home Medications  Prior to Admission medications   Medication Sig Start Date End Date Taking? Authorizing Provider  albuterol (VENTOLIN HFA) 108 (90 Base) MCG/ACT inhaler Inhale 2 puffs into the lungs every 4 (four) hours as needed for wheezing or shortness of breath.   Yes [provider]  amphetamine-dextroamphetamine (ADDERALL XR) 30 MG 24 hr capsule Take 60 mg by mouth daily.   Yes [provider]  apixaban (ELIQUIS) 5 MG TABS tablet Take 1 tablet (5 mg total) by mouth 2 (two) times daily. 06/18/22  Yes Elgie Collard, PA-C  aspirin-acetaminophen-caffeine (EXCEDRIN MIGRAINE) [phone] MG tablet Take 2 tablets by mouth every 6 (six) hours as needed for headache.   Yes [provider]  atorvastatin (LIPITOR) 80 MG tablet Take 1 tablet (80 mg total) by mouth daily. 09/09/22  Yes Conte, Tessa N, PA-C  diltiazem (CARDIZEM) 30 MG tablet Take 1 tablet (30 mg total) by mouth as needed. Patient taking differently: Take 30 mg by mouth as needed (tachycardia). 09/01/22  Yes Conte, Tessa N, PA-C  escitalopram (LEXAPRO) 10 MG tablet Take 10 mg by mouth daily as needed (anxiety).   Yes [provider]  gabapentin (NEURONTIN) 100 MG capsule Take 100-200 mg by mouth 4 (four) times daily as needed (pain).   Yes [provider]  gabapentin (NEURONTIN) 300 MG capsule Take 600 mg by mouth daily as needed (pain).   Yes [provider]  isosorbide mononitrate (IMDUR) 30 MG 24 hr tablet Take 0.5 tablets (15 mg total) by mouth daily. 08/03/22  Yes Conte, Tessa N, PA-C  methocarbamol (ROBAXIN) 500 MG tablet Take 500 mg by mouth daily as needed for muscle spasms.    Yes [provider]  metoprolol succinate (TOPROL-XL) 25 MG 24 hr tablet Take 1 tablet (25 mg total) by mouth in the morning and at bedtime. 09/25/22  Yes Burnell Blanks, MD  nitroGLYCERIN (NITROSTAT) 0.4 MG SL tablet Place 1 tablet (0.4 mg total) under the tongue every 5 (five) minutes as needed for chest pain. 07/24/22 10/22/22 Yes Conte, Tessa N, PA-C  omeprazole (PRILOSEC) 20 MG capsule Take 20 mg by mouth daily.   Yes [provider]  zolpidem (AMBIEN) 10 MG tablet Take 10 mg by mouth at bedtime.   Yes [provider]     Critical care time: N/A

## 2022-10-11 ENCOUNTER — Inpatient Hospital Stay (HOSPITAL_COMMUNITY): Payer: BC Managed Care – PPO

## 2022-10-11 DIAGNOSIS — Z951 Presence of aortocoronary bypass graft: Secondary | ICD-10-CM | POA: Diagnosis not present

## 2022-10-11 LAB — PREPARE RBC (CROSSMATCH)

## 2022-10-11 LAB — CBC
HCT: 20.6 % — ABNORMAL LOW (ref 36.0–46.0)
Hemoglobin: 6.9 g/dL — CL (ref 12.0–15.0)
MCH: 31.7 pg (ref 26.0–34.0)
MCHC: 33.5 g/dL (ref 30.0–36.0)
MCV: 94.5 fL (ref 80.0–100.0)
Platelets: 149 10*3/uL — ABNORMAL LOW (ref 150–400)
RBC: 2.18 MIL/uL — ABNORMAL LOW (ref 3.87–5.11)
RDW: 13.9 % (ref 11.5–15.5)
WBC: 11.6 10*3/uL — ABNORMAL HIGH (ref 4.0–10.5)
nRBC: 0 % (ref 0.0–0.2)

## 2022-10-11 LAB — BASIC METABOLIC PANEL
Anion gap: 5 (ref 5–15)
BUN: 8 mg/dL (ref 6–20)
CO2: 33 mmol/L — ABNORMAL HIGH (ref 22–32)
Calcium: 8.2 mg/dL — ABNORMAL LOW (ref 8.9–10.3)
Chloride: 97 mmol/L — ABNORMAL LOW (ref 98–111)
Creatinine, Ser: 0.63 mg/dL (ref 0.44–1.00)
GFR, Estimated: >60 mL/min (ref >60)
Glucose, Bld: 104 mg/dL — ABNORMAL HIGH (ref 70–99)
Potassium: 4.5 mmol/L (ref 3.5–5.1)
Sodium: 135 mmol/L (ref 135–145)

## 2022-10-11 LAB — GLUCOSE, CAPILLARY
Glucose-Capillary: 106 mg/dL — ABNORMAL HIGH (ref 70–99)
Glucose-Capillary: 106 mg/dL — ABNORMAL HIGH (ref 70–99)
Glucose-Capillary: 111 mg/dL — ABNORMAL HIGH (ref 70–99)
Glucose-Capillary: 129 mg/dL — ABNORMAL HIGH (ref 70–99)
Glucose-Capillary: 131 mg/dL — ABNORMAL HIGH (ref 70–99)
Glucose-Capillary: 142 mg/dL — ABNORMAL HIGH (ref 70–99)
Glucose-Capillary: 142 mg/dL — ABNORMAL HIGH (ref 70–99)

## 2022-10-11 MED ORDER — POTASSIUM CHLORIDE CRYS ER 20 MEQ PO TBCR
20.0000 meq | EXTENDED_RELEASE_TABLET | Freq: Two times a day (BID) | ORAL | Status: AC
  Administered 2022-10-11 (×2): 20 meq via ORAL
  Filled 2022-10-11 (×2): qty 1

## 2022-10-11 MED ORDER — LIDOCAINE 5 % EX PTCH
2.0000 | MEDICATED_PATCH | CUTANEOUS | Status: DC
  Administered 2022-10-12 – 2022-10-15 (×3): 2 via TRANSDERMAL
  Filled 2022-10-11 (×4): qty 2

## 2022-10-11 MED ORDER — SODIUM CHLORIDE 0.9% IV SOLUTION: Freq: Once | INTRAVENOUS | Status: AC

## 2022-10-11 MED ORDER — FUROSEMIDE 10 MG/ML IJ SOLN
40.0000 mg | Freq: Two times a day (BID) | INTRAMUSCULAR | Status: DC
  Administered 2022-10-11 (×2): 40 mg via INTRAVENOUS
  Filled 2022-10-11 (×2): qty 4

## 2022-10-11 NOTE — Progress Notes (Signed)
   NAME:  [name], MRN:  [id number], DOB:  July 03, 1971, LOS: 3 ADMISSION DATE:  10/08/2022, CONSULTATION DATE:  10/10/22 REFERRING MD:  Cyndia Bent, CHIEF COMPLAINT:  Chest pain   History of Present Illness:  52 year old woman w/ hx of HLD, HTN, smoking, RA< GERD presenting with exertional chest pain found to have severe multivessel CAD.  On 10/08/22 she underwent CABG x 3 (LIMA-LAD, SVG-diag-SVG-OM1) with Dr. Cyndia Bent.  Postoperative course complicated by persistent O2 need for which PCCM is consulted.  Patient states she coughed up "a lot" last night and feels better this AM.  Still feels like there is phlegm in back of her throat.  She is using IS, braced coughing.  Currently up in chair.  On no home O2.  Still smoking.  Pain under reasonable control.  Pertinent  Medical History  Anxiety RA Smoking GERD Fibromyalgia  Significant Hospital Events: Including procedures, antibiotic start and stop dates in addition to other pertinent events     Interim History / Subjective:  Improved breathing today with diuresis. Down to 1LPM. Cough is a bit better. Sputum samples to date unfortunately not adequate to culture.  Objective   Blood pressure (!) 95/59, pulse (!) 102, temperature 99.2 F (37.3 C), temperature source Oral, resp. rate 16, height 5\' 2"  (1.575 m), weight 88.2 kg, SpO2 92 %.        Intake/Output Summary (Last 24 hours) at 10/11/2022 [id number] Last data filed at 10/11/2022 0600 Gross per 24 hour  Intake 500 ml  Output 2715 ml  Net -2215 ml    Filed Weights   10/09/22 0700 10/10/22 0640 10/11/22 0500  Weight: 91.2 kg 90.6 kg 88.2 kg    Examination: No distress MMM, trachea midline Lungs mild crackles, normal RR Ext no edema  Hgb remains borderline WBC better Plts better BMP stable CXR improved aeration  Resolved Hospital Problem list   N/A  Assessment & Plan:  Symptomatic multivessel CAD s/p CABG x 3, R SVG harvest, left atrial appendage ligation, pulmonary vein  isolation ablation 3/28 Postoperative hypoxemia- improved, related to fluid shifts, atelectasis.  Doubtful PNA, sputum not adequate for culture to date.  WBC trending down without abx.  Afebrile Hx anxiety, depression, fibromyalgia, HLD, smoking, GERD Hx symptomatic afib- on eliquis and rate control agents PTA. S/P procedures above  - Continue diuresis, IS, flutter - Up in chair, encourage deep breathing, braced coughing - Wean O2 for sats > 90% - Chest drain, GDMT, and pain management per primary - Will follow with you through weekend until Dr. Cyndia Bent returns Monday.  Erskine Emery MD PCCM

## 2022-10-11 NOTE — Discharge Instructions (Signed)

## 2022-10-11 NOTE — Discharge Summary (Incomplete)
Physician Discharge Summary  Patient ID: [name] MRN: [id number] DOB/AGE: 10/06/1970 52 y.o.  Admit date: 10/08/2022 Discharge date: 10/15/2022  Admission Diagnoses:  Multivessel coronary artery disease Hypertension Dyslipidemia Tobacco abuse Gastroesophageal reflux disease Anxiety  Discharge Diagnoses:   Multivessel coronary artery disease Hypertension Dyslipidemia Tobacco abuse Gastroesophageal reflux disease Anxiety Principal Problem:   S/P CABG x 4 Expected acute blood loss anemia Hypoxia  Discharged Condition: stable  PCP is [name], MD Referring Provider is End, Harrell Gave, MD  History of Present Illness:   The patient is a 52 year old woman with a history of hyperlipidemia, hypertension, ongoing 1 pack/day smoking, fibromyalgia, rheumatoid arthritis, GERD, and anxiety who has had episodes of tachypalpitations and tachycardia for the past few years.  She said that she had an EKG done during one of these episodes when she went to the fire station and she was told that she was in atrial fibrillation.  She also reports a history of exertional substernal chest discomfort radiating to her left shoulder as well as exertional shortness of breath and fatigue.  Bending over to lift things up also brings on the symptoms.  A 2D echocardiogram on 07/20/2022 showed a moderately decreased left ventricular ejection fraction of 35 to 40% with mild global hypokinesis and moderate septal hypokinesis.  There was mild to moderate mitral regurgitation.  Her previous echocardiogram in March 2021 showed a left ventricular ejection fraction of 50 to 55% with trivial mitral regurgitation.  She underwent a calcium scoring CT on 07/31/2022 showing a coronary calcium score of 909 which was the 99th percentile.  CT FFR showed a flow-limiting stenosis in a large second diagonal branch.  Cardiac catheterization on 09/07/2022 showed sequential 90% and 70% lesions in the LAD flanking an  aneurysmal segment where a large second diagonal branch arises from.  The second diagonal branch had about 70% proximal stenosis.  The right coronary artery has a segmental 60% concentric mid vessel stenosis.  The RFR in the mid RCA was 0.94.  The RFR in the LAD was 0.46.  Left heart filling pressures were mildly elevated with an LVEDP of 17 and pulm capillary wedge of 13.  Right heart pressures were normal.   She continues to smoke 1 pack of cigarettes per day and has since she was a teenager.  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.  I agree that coronary artery bypass graft surgery is the best treatment for resolution of her symptoms and to preserve myocardium.  She has had some episodes of tachycardia and was told at a fire station that she was atrial fibrillation previously.  All of her ECGs in EPIC shows sinus rhythm.  She has had a lot of problems with these tachypalpitations that have been very symptomatic and is on Eliquis with the presumption that she may be having atrial fibrillation.  I think it would be worthwhile doing a pulmonary vein ablation to decrease the risk of atrial fibrillation as well as clipping her left atrial appendage.  I reviewed the cardiac catheterization and echo images with her and answered her questions.  I would plan to use a left internal mammary graft for the LAD and vein grafts for her diagonal and right coronary artery.  I do not think it would be a good idea to attempt bilateral internal mammary artery grafts in a relatively short, obese woman who is a heavy smoker.  Her radial artery pulses are relatively weak and her arms are short and I  doubt that her radial arteries would be ideal grafts.   I discussed the operative procedure with the patient including alternatives, benefits and risks; including but not limited to bleeding, blood transfusion, infection, stroke, myocardial infarction, graft failure, heart block requiring a permanent pacemaker, organ  dysfunction, and death.  I also discussed the importance of smoking cessation and preventing further progression of coronary disease in her own native arteries or bypass grafts.  [name] understands and agrees to proceed.     Hospital Course: [name] was admitted for elective surgery on 10/08/2022. She was taken to the OR where CABG x 4 was carried out without complication.  Following the procedure, she separated from cardiopulmonary bypass without difficulty and was transferred tot the ICU in stable condition.  Vital signs and hemodynamics remained stable.  She was weaned from the ventilator and extubated at 830 on the day of surgery.  Neo-Synephrine was weaned on the first postoperative day. Blood pressure was supported with oral midodrine.  Diuresis was initiated for expected  volume excess.  The monitoring lines were removed on postop day 1.  Chest tubes were left in place for drainage.  She was started on a nicotine patch per her request.  Mobilized routinely.  Chest tube drainage subsided allowing chest tube removal on postop day 2.  Supplemental oxygen requirement gradually decline.  She was transfused with 1 unit of packed red blood cells on postop day 3 for hemoglobin of 6.9 with appropriate response in blood counts. Diuresis was initiated. [name] was ready for transfer to 4E Progressive Care on post-op day 4.She progressed with mobility and diuresis continued for significant volume excess. The supplemental oxygen was weaned off but then she was noted to have some hypoxia with activity. This persisted despite diuresis back to baseline weight and efforts to optimize pulmonary hygiene.  A 6-minute walk test confirmed the need for continued supplemental O2 therapy at home.  She regained independence with mobility and had return of normal bowel and bladder function.  She was ready for discharge to home on post-op day 7.   Consults: None  Significant Diagnostic Studies:  CLINICAL  DATA:  CABG   EXAM: PORTABLE CHEST 1 VIEW   COMPARISON:  Chest radiograph 10/11/2022   FINDINGS: Status post median sternotomy and CABG. Partially visualized cervical spinal fusion hardware in place. Possible trace left pleural effusion. Compared to prior exam there is increased left basilar airspace opacity, which could represent atelectasis or infection. Right lung is unchanged in appearance. No pneumothorax. No radiographically apparent displaced rib fractures. Visualized upper abdomen is unremarkable.   IMPRESSION: 1. Compared to prior exam there is increased left basilar airspace opacity, which could represent atelectasis or infection. 2. Possible trace left pleural effusion.     Electronically Signed   By: Marin Roberts M.D.   On: 10/12/2022 07:30    Treatments: CARDIOVASCULAR SURGERY OPERATIVE NOTE   10/08/2022   Surgeon:  Gaye Pollack, MD   First Assistant: Lucas Mallow, MD and Enid Cutter,  PA-C:   An experienced assistant was required given the complexity of this surgery and the standard of surgical care. The assistant was needed for endoscopic vein harvest, exposure, dissection, suctioning, retraction of delicate tissues and sutures, instrument exchange and for overall help during this procedure.     Preoperative Diagnosis:  Severe multi-vessel coronary artery disease     Postoperative Diagnosis:  Same     Procedure:   Median Sternotomy Extracorporeal circulation 3.   Coronary  artery bypass grafting x 3   Left internal mammary artery graft to the LAD SVG to diagonal SVG to OM1   4.   Endoscopic vein harvest from the right leg   5.   Pulmonary vein isolation ablation using the Encompass clamp.   6.   Ligation of left atrial appendage.     Anesthesia:  General Endotracheal  Discharge Exam: Blood pressure 109/69, pulse (!) 103, temperature 98.6 F (37 C), temperature source Oral, resp. rate 20, height 5\' 2"  (1.575 m), weight 82.1 kg,  SpO2 94 %.  General appearance: alert, cooperative, and no distress Neurologic: intact Heart: SR / ST. Lungs: few bilateral basilar crackles Extremities: mild RLE edema, none in left leg. The right leg EVH incision is well approximated and dry.  Wound: Breast binder in place, the sternotomy incision is healing with no sign of complication.  Disposition: Discharged to home with home O2.  Discharge Instructions     Amb Referral to Cardiac Rehabilitation   Complete by: As directed    Diagnosis: CABG   CABG X ___: 3   After initial evaluation and assessments completed: Virtual Based Care may be provided alone or in conjunction with Phase 2 Cardiac Rehab based on patient barriers.: Yes   Intensive Cardiac Rehabilitation (ICR) [place] location only OR Traditional Cardiac Rehabilitation (TCR) *If criteria for ICR are not met will enroll in TCR [facility] only): Yes      Allergies as of 10/15/2022       Reactions   Zoloft [sertraline] Other (See Comments)   Altered mental state        Medication List     STOP taking these medications    apixaban 5 MG Tabs tablet Commonly known as: ELIQUIS   aspirin-acetaminophen-caffeine 250-250-65 MG tablet Commonly known as: EXCEDRIN MIGRAINE   diltiazem 30 MG tablet Commonly known as: Cardizem   isosorbide mononitrate 30 MG 24 hr tablet Commonly known as: IMDUR       TAKE these medications    albuterol 108 (90 Base) MCG/ACT inhaler Commonly known as: VENTOLIN HFA Inhale 2 puffs into the lungs every 4 (four) hours as needed for wheezing or shortness of breath.   amphetamine-dextroamphetamine 30 MG 24 hr capsule Commonly known as: ADDERALL XR Take 60 mg by mouth daily.   aspirin EC 325 MG tablet Take 1 tablet (325 mg total) by mouth daily.   atorvastatin 80 MG tablet Commonly known as: LIPITOR Take 1 tablet (80 mg total) by mouth daily.   escitalopram 10 MG tablet Commonly known as: LEXAPRO Take 10 mg by mouth daily as needed  (anxiety).   Fe Fum-Vit C-Vit B12-FA Caps capsule Commonly known as: TRIGELS-F FORTE Take 1 capsule by mouth daily after breakfast. For 1 month.   gabapentin 100 MG capsule Commonly known as: NEURONTIN Take 100-200 mg by mouth 4 (four) times daily as needed (pain).   gabapentin 300 MG capsule Commonly known as: NEURONTIN Take 600 mg by mouth daily as needed (pain).   guaiFENesin 600 MG 12 hr tablet Commonly known as: MUCINEX Take 1 tablet (600 mg total) by mouth 2 (two) times daily. For 1 week.   lactulose 10 GM/15ML solution Commonly known as: CHRONULAC Take 30 mLs (20 g total) by mouth daily as needed for mild constipation.   methocarbamol 500 MG tablet Commonly known as: ROBAXIN Take 500 mg by mouth daily as needed for muscle spasms.   metoprolol succinate 25 MG 24 hr tablet Commonly known as: TOPROL-XL Take 1  tablet (25 mg total) by mouth in the morning and at bedtime.   midodrine 5 MG tablet Commonly known as: PROAMATINE Take 1 tablet (5 mg total) by mouth 3 (three) times daily with meals.   nicotine 14 mg/24hr patch Commonly known as: NICODERM CQ - dosed in mg/24 hours Place 1 patch (14 mg total) onto the skin daily. For 3 weeks then reduce the dose to 7mg  patch topically daily (available over the counter)   nitroGLYCERIN 0.4 MG SL tablet Commonly known as: NITROSTAT Place 1 tablet (0.4 mg total) under the tongue every 5 (five) minutes as needed for chest pain.   omeprazole 20 MG capsule Commonly known as: PRILOSEC Take 20 mg by mouth daily.   traMADol 50 MG tablet Commonly known as: ULTRAM Take 1 tablet (50 mg total) by mouth every 4 (four) hours as needed for up to 7 days for moderate pain.   zolpidem 10 MG tablet Commonly known as: AMBIEN Take 10 mg by mouth at bedtime.               Durable Medical Equipment  (From admission, onward)           Start     Ordered   10/14/22 1627  For home use only DME 4 wheeled rolling walker with seat   Once       Question:  Patient needs a walker to treat with the following condition  Answer:  Weakness generalized   10/14/22 1626   10/14/22 1514  For home use only DME oxygen  Once       Question Answer Comment  Length of Need 6 Months   Mode or (Route) Nasal cannula   Liters per Minute 2   Frequency Continuous (stationary and portable oxygen unit needed)   Oxygen conserving device No   Oxygen delivery system Gas      10/14/22 [facility]. Go on 10/19/2022.   Specialty: Cardiothoracic Surgery Why: Your appointment for suture removal is at 11am. Contact information: [place], [place]        Elgie Collard, [place]. Go on 11/02/2022.   Specialty: Cardiology Why: Your appointment is at 10:05am. Contact information: [address] [place] [address] [phone]         Gaye Pollack, MD. Go on 11/04/2022.   Specialty: Cardiothoracic Surgery Why: Your appointment is at 9:30am.  Please obtain an chest x-ray 1 hour before the appointment at [facility] located at [address] Contact information: [place] [address] [place]. Follow up.   Why: Latricia Heft) [place] office protocol referral- they will contact you to schedule for any RN/PT needs Contact information: [place] [address] [phone]         [facility] Follow up.   Why: rollator and home 02 arranged- DME to be delivered to room prior to discharge Contact information: [place] [address] [phone]                 The patient has been discharged on:   1.Beta Blocker:  Yes [  x ]  No   [   ]                              If No, reason:  2.Ace Inhibitor/ARB: Yes [   ]                                      No  [  x  ]                                     If No, reason: Hypotension  3.Statin:   Yes [ x  ]                  No  [   ]                  If No, reason:  4.Ecasa:  Yes  [  x ]                  No   [   ]                  If No, reason:  5. ACS on Admission?  No  P2Y12 Inhibitor:  Yes  [   ]                                No  [ x ]    Signed: [name], PA-C 10/15/2022, 8:03 AM

## 2022-10-11 NOTE — Progress Notes (Signed)
TCTS Progress Note: 3 Days Post-Op Procedure(s) (LRB): CORONARY ARTERY BYPASS GRAFTING (CABG) X3 USING LEFT INTERNAL MAMMARY ARTERY AND RIGHT GREATER SAPHENOUS VEIN HARVESTED ENDOSCOPICALLY (N/A) PULMONARY VEIN ABLATION USING ATRICURE ISOLATOR SYNERGY ENCOMPASS OLH (N/A) TRANSESOPHAGEAL ECHOCARDIOGRAM (N/A) CLIPPING OF ATRIAL APPENDAGE USING ATRICURE ATRICLIP PRO2 SIZE 35 (N/A)  LOS: 3 days   No significant issues since AM note Stable.      Latest Ref Rng & Units 10/11/2022    1:56 AM 10/10/2022    4:40 AM 10/09/2022    5:22 PM  CBC  WBC 4.0 - 10.5 K/uL 11.6  13.7  14.8   Hemoglobin 12.0 - 15.0 g/dL 6.9  7.0  7.5   Hematocrit 36.0 - 46.0 % 20.6  20.7  23.7   Platelets 150 - 400 K/uL 149  126  150        Latest Ref Rng & Units 10/11/2022    1:56 AM 10/10/2022    4:40 AM 10/09/2022    5:22 PM  CMP  Glucose 70 - 99 mg/dL 104  117  135   BUN 6 - 20 mg/dL 8  10  10    Creatinine 0.44 - 1.00 mg/dL 0.63  0.58  0.74   Sodium 135 - 145 mmol/L 135  133  133   Potassium 3.5 - 5.1 mmol/L 4.5  4.8  3.9   Chloride 98 - 111 mmol/L 97  97  100   CO2 22 - 32 mmol/L 33  27  24   Calcium 8.9 - 10.3 mg/dL 8.2  8.1  7.8     ABG    Component Value Date/Time   PHART 7.347 (L) 10/08/2022 2025   PCO2ART 45.9 10/08/2022 2025   PO2ART 87 10/08/2022 2025   HCO3 25.0 10/08/2022 2025   TCO2 26 10/08/2022 2025   ACIDBASEDEF 1.0 10/08/2022 2025   O2SAT 96 10/08/2022 2025

## 2022-10-11 NOTE — Progress Notes (Signed)
TCTS Progress Note: 3 Days Post-Op Procedure(s) (LRB): CORONARY ARTERY BYPASS GRAFTING (CABG) X3 USING LEFT INTERNAL MAMMARY ARTERY AND RIGHT GREATER SAPHENOUS VEIN HARVESTED ENDOSCOPICALLY (N/A) PULMONARY VEIN ABLATION USING ATRICURE ISOLATOR SYNERGY ENCOMPASS OLH (N/A) TRANSESOPHAGEAL ECHOCARDIOGRAM (N/A) CLIPPING OF ATRIAL APPENDAGE USING ATRICURE ATRICLIP PRO2 SIZE 35 (N/A)  LOS: 3 days   POD 3 CAB   Doing well  On 1l 02 - a lot better was on 5 l yest Sitting up and talking not moving a whole lot Back pain lido patch     Meds:  ASA: yes DVT ppx: yes BB: yes  Lasix: 40 BID   Plan:  N po meds and lido patch, added a 2nd one today CV no issues Resp diuresing GI no issues, no BM since day of surgery, needs dulcolax suppository if didn't get yest.  GU: peeing now after foley out HEME - HGB 6.9 givine 1u blood  ID: no issues Endo no issues T/l/d  Plan 1u rbc Diuresis Floor tonight or tomorrow. Still has pacing wires.       Latest Ref Rng & Units 10/11/2022    1:56 AM 10/10/2022    4:40 AM 10/09/2022    5:22 PM  CBC  WBC 4.0 - 10.5 K/uL 11.6  13.7  14.8   Hemoglobin 12.0 - 15.0 g/dL 6.9  7.0  7.5   Hematocrit 36.0 - 46.0 % 20.6  20.7  23.7   Platelets 150 - 400 K/uL 149  126  150        Latest Ref Rng & Units 10/11/2022    1:56 AM 10/10/2022    4:40 AM 10/09/2022    5:22 PM  CMP  Glucose 70 - 99 mg/dL 104  117  135   BUN 6 - 20 mg/dL 8  10  10    Creatinine 0.44 - 1.00 mg/dL 0.63  0.58  0.74   Sodium 135 - 145 mmol/L 135  133  133   Potassium 3.5 - 5.1 mmol/L 4.5  4.8  3.9   Chloride 98 - 111 mmol/L 97  97  100   CO2 22 - 32 mmol/L 33  27  24   Calcium 8.9 - 10.3 mg/dL 8.2  8.1  7.8     ABG    Component Value Date/Time   PHART 7.347 (L) 10/08/2022 2025   PCO2ART 45.9 10/08/2022 2025   PO2ART 87 10/08/2022 2025   HCO3 25.0 10/08/2022 2025   TCO2 26 10/08/2022 2025   ACIDBASEDEF 1.0 10/08/2022 2025   O2SAT 96 10/08/2022 2025

## 2022-10-12 ENCOUNTER — Inpatient Hospital Stay (HOSPITAL_COMMUNITY): Payer: BC Managed Care – PPO

## 2022-10-12 LAB — BPAM RBC
Blood Product Expiration Date: 202404202359
ISSUE DATE / TIME: 202403311001
Unit Type and Rh: 5100

## 2022-10-12 LAB — BASIC METABOLIC PANEL
Anion gap: 7 (ref 5–15)
BUN: 9 mg/dL (ref 6–20)
CO2: 33 mmol/L — ABNORMAL HIGH (ref 22–32)
Calcium: 8 mg/dL — ABNORMAL LOW (ref 8.9–10.3)
Chloride: 92 mmol/L — ABNORMAL LOW (ref 98–111)
Creatinine, Ser: 0.63 mg/dL (ref 0.44–1.00)
GFR, Estimated: >60 mL/min (ref >60)
Glucose, Bld: 106 mg/dL — ABNORMAL HIGH (ref 70–99)
Potassium: 4.5 mmol/L (ref 3.5–5.1)
Sodium: 132 mmol/L — ABNORMAL LOW (ref 135–145)

## 2022-10-12 LAB — TYPE AND SCREEN
ABO/RH(D): O POS
Antibody Screen: NEGATIVE
Unit division: 0

## 2022-10-12 LAB — CBC
HCT: 24.2 % — ABNORMAL LOW (ref 36.0–46.0)
Hemoglobin: 8.1 g/dL — ABNORMAL LOW (ref 12.0–15.0)
MCH: 31.3 pg (ref 26.0–34.0)
MCHC: 33.5 g/dL (ref 30.0–36.0)
MCV: 93.4 fL (ref 80.0–100.0)
Platelets: 191 10*3/uL (ref 150–400)
RBC: 2.59 MIL/uL — ABNORMAL LOW (ref 3.87–5.11)
RDW: 15 % (ref 11.5–15.5)
WBC: 7.8 10*3/uL (ref 4.0–10.5)
nRBC: 0.3 % — ABNORMAL HIGH (ref 0.0–0.2)

## 2022-10-12 LAB — GLUCOSE, CAPILLARY
Glucose-Capillary: 111 mg/dL — ABNORMAL HIGH (ref 70–99)
Glucose-Capillary: 118 mg/dL — ABNORMAL HIGH (ref 70–99)
Glucose-Capillary: 109 mg/dL — ABNORMAL HIGH (ref 70–99)

## 2022-10-12 MED ORDER — LACTULOSE 10 GM/15ML PO SOLN
20.0000 g | Freq: Every day | ORAL | Status: DC | PRN
  Administered 2022-10-12: 20 g via ORAL
  Filled 2022-10-12: qty 30

## 2022-10-12 MED ORDER — ASPIRIN 325 MG PO TBEC
325.0000 mg | DELAYED_RELEASE_TABLET | Freq: Every day | ORAL | Status: DC
  Administered 2022-10-12 – 2022-10-15 (×4): 325 mg via ORAL
  Filled 2022-10-12 (×4): qty 1

## 2022-10-12 MED ORDER — FUROSEMIDE 10 MG/ML IJ SOLN
40.0000 mg | Freq: Two times a day (BID) | INTRAMUSCULAR | Status: AC
  Administered 2022-10-12 (×2): 40 mg via INTRAVENOUS
  Filled 2022-10-12 (×2): qty 4

## 2022-10-12 MED ORDER — METOPROLOL TARTRATE 12.5 MG HALF TABLET
12.5000 mg | ORAL_TABLET | Freq: Two times a day (BID) | ORAL | Status: DC
  Administered 2022-10-12 – 2022-10-15 (×6): 12.5 mg via ORAL
  Filled 2022-10-12 (×7): qty 1

## 2022-10-12 MED ORDER — CHLORHEXIDINE GLUCONATE CLOTH 2 % EX PADS
6.0000 | MEDICATED_PAD | Freq: Every day | CUTANEOUS | Status: DC
  Administered 2022-10-12 – 2022-10-15 (×4): 6 via TOPICAL

## 2022-10-12 MED ORDER — ~~LOC~~ CARDIAC SURGERY, PATIENT & FAMILY EDUCATION: Freq: Once | Status: AC

## 2022-10-12 MED ORDER — POTASSIUM CHLORIDE CRYS ER 20 MEQ PO TBCR
20.0000 meq | EXTENDED_RELEASE_TABLET | Freq: Two times a day (BID) | ORAL | Status: AC
  Administered 2022-10-12 (×2): 20 meq via ORAL
  Filled 2022-10-12 (×2): qty 1

## 2022-10-12 MED ORDER — SODIUM CHLORIDE 0.9 % IV SOLN: 250.0000 mL | INTRAVENOUS | Status: DC | PRN

## 2022-10-12 MED ORDER — MIDODRINE HCL 5 MG PO TABS
5.0000 mg | ORAL_TABLET | Freq: Three times a day (TID) | ORAL | Status: DC
  Administered 2022-10-12 – 2022-10-15 (×9): 5 mg via ORAL
  Filled 2022-10-12 (×9): qty 1

## 2022-10-12 MED ORDER — SODIUM CHLORIDE 0.9% FLUSH: 3.0000 mL | INTRAVENOUS | Status: DC | PRN

## 2022-10-12 MED ORDER — SODIUM CHLORIDE 0.9% FLUSH
3.0000 mL | Freq: Two times a day (BID) | INTRAVENOUS | Status: DC
  Administered 2022-10-12 – 2022-10-15 (×6): 3 mL via INTRAVENOUS

## 2022-10-12 NOTE — Progress Notes (Signed)
4 Days Post-Op Procedure(s) (LRB): CORONARY ARTERY BYPASS GRAFTING (CABG) X3 USING LEFT INTERNAL MAMMARY ARTERY AND RIGHT GREATER SAPHENOUS VEIN HARVESTED ENDOSCOPICALLY (N/A) PULMONARY VEIN ABLATION USING ATRICURE ISOLATOR SYNERGY ENCOMPASS OLH (N/A) TRANSESOPHAGEAL ECHOCARDIOGRAM (N/A) CLIPPING OF ATRIAL APPENDAGE USING ATRICURE ATRICLIP PRO2 SIZE 35 (N/A) Subjective: Feeling better, walking well. No shortness of breath.  Objective: Vital signs in last 24 hours: Temp:  [98.1 F (36.7 C)-99.2 F (37.3 C)] 98.8 F (37.1 C) (04/01 0300) Pulse Rate:  [95-110] 104 (04/01 0700) Cardiac Rhythm: Normal sinus rhythm;Sinus tachycardia (03/31 2000) Resp:  [13-29] 29 (04/01 0700) BP: (90-122)/(59-95) 111/69 (04/01 0700) SpO2:  [90 %-97 %] 96 % (04/01 0700) Weight:  [86.8 kg] 86.8 kg (04/01 0500)  Hemodynamic parameters for last 24 hours:    Intake/Output from previous day: 03/31 0701 - 04/01 0700 In: 503 [P.O.:250; I.V.:3; Blood:250] Out: 3100 [Urine:3100] Intake/Output this shift: No intake/output data recorded.  General appearance: alert and cooperative Neurologic: intact Heart: regular rate and rhythm Lungs: clear to auscultation bilaterally Extremities: edema mild Wound: incisions ok  Lab Results: Recent Labs    10/11/22 0156 10/12/22 0548  WBC 11.6* 7.8  HGB 6.9* 8.1*  HCT 20.6* 24.2*  PLT 149* 191   BMET:  Recent Labs    10/11/22 0156 10/12/22 0548  NA 135 132*  K 4.5 4.5  CL 97* 92*  CO2 33* 33*  GLUCOSE 104* 106*  BUN 8 9  CREATININE 0.63 0.63  CALCIUM 8.2* 8.0*    PT/INR: No results for input(s): "LABPROT", "INR" in the last 72 hours. ABG    Component Value Date/Time   PHART 7.347 (L) 10/08/2022 2025   HCO3 25.0 10/08/2022 2025   TCO2 26 10/08/2022 2025   ACIDBASEDEF 1.0 10/08/2022 2025   O2SAT 96 10/08/2022 2025   CBG (last 3)  Recent Labs    10/11/22 1924 10/11/22 2350 10/12/22 0344  GLUCAP 131* 111* 111*   CXR: improving  aeration  Assessment/Plan: S/P Procedure(s) (LRB): CORONARY ARTERY BYPASS GRAFTING (CABG) X3 USING LEFT INTERNAL MAMMARY ARTERY AND RIGHT GREATER SAPHENOUS VEIN HARVESTED ENDOSCOPICALLY (N/A) PULMONARY VEIN ABLATION USING ATRICURE ISOLATOR SYNERGY ENCOMPASS OLH (N/A) TRANSESOPHAGEAL ECHOCARDIOGRAM (N/A) CLIPPING OF ATRIAL APPENDAGE USING ATRICURE ATRICLIP PRO2 SIZE 35 (N/A)  POD 4 Hemodynamically stable on midodrine 10 tid. Will decrease to 5. Start low dose Lopressor for resting sinus tachycardia.  Volume excess: wt is down 3 lbs from yesterday but still 10 lbs over preop. -2500 cc yesterday. Continue diuresis.  Glucose under good control.  Transfer to 4E and continue IS, ambulation.   LOS: 4 days    Gaye Pollack 10/12/2022

## 2022-10-13 ENCOUNTER — Encounter (HOSPITAL_COMMUNITY): Payer: Self-pay | Admitting: Surgery

## 2022-10-13 ENCOUNTER — Ambulatory Visit: Payer: BC Managed Care – PPO | Admitting: Physician Assistant

## 2022-10-13 LAB — EXPECTORATED SPUTUM ASSESSMENT W GRAM STAIN, RFLX TO RESP C

## 2022-10-13 MED ORDER — POTASSIUM CHLORIDE CRYS ER 20 MEQ PO TBCR
20.0000 meq | EXTENDED_RELEASE_TABLET | Freq: Two times a day (BID) | ORAL | Status: DC
  Administered 2022-10-13 – 2022-10-15 (×5): 20 meq via ORAL
  Filled 2022-10-13 (×5): qty 1

## 2022-10-13 MED ORDER — LACTULOSE 10 GM/15ML PO SOLN
20.0000 g | Freq: Once | ORAL | Status: AC
  Administered 2022-10-13: 20 g via ORAL
  Filled 2022-10-13: qty 30

## 2022-10-13 MED ORDER — NICOTINE 14 MG/24HR TD PT24
14.0000 mg | MEDICATED_PATCH | Freq: Every day | TRANSDERMAL | Status: DC
  Administered 2022-10-13 – 2022-10-15 (×3): 14 mg via TRANSDERMAL
  Filled 2022-10-13 (×3): qty 1

## 2022-10-13 MED ORDER — FUROSEMIDE 40 MG PO TABS
40.0000 mg | ORAL_TABLET | Freq: Two times a day (BID) | ORAL | Status: DC
  Administered 2022-10-13 – 2022-10-15 (×5): 40 mg via ORAL
  Filled 2022-10-13 (×5): qty 1

## 2022-10-13 NOTE — Progress Notes (Signed)
CARDIAC REHAB PHASE I   PRE:  Rate/Rhythm: 95 NSR  BP:  Sitting: 110/69      SaO2: 96 1L  MODE:  Ambulation: 470 ft   AD:  RW  POST:  Rate/Rhythm: Tele battery died  BP:  Sitting: 110/70      SaO2: 99 1L  Pt amb with standby assistance, pt denies CP and SOB during amb and was returned to room w/o complaint.   Pt really wants to go home. Will refer to [facility] for CRP2  Christen Bame  1:21 PM 10/13/2022

## 2022-10-13 NOTE — Progress Notes (Addendum)
[address]       [place] [address]             [phone]      5 Days Post-Op Procedure(s) (LRB): CORONARY ARTERY BYPASS GRAFTING (CABG) X3 USING LEFT INTERNAL MAMMARY ARTERY AND RIGHT GREATER SAPHENOUS VEIN HARVESTED ENDOSCOPICALLY (N/A) PULMONARY VEIN ABLATION USING ATRICURE ISOLATOR SYNERGY ENCOMPASS OLH (N/A) TRANSESOPHAGEAL ECHOCARDIOGRAM (N/A) CLIPPING OF ATRIAL APPENDAGE USING ATRICURE ATRICLIP PRO2 SIZE 35 (N/A) Subjective: Transferred form ICU yesterday.  Resting in bed. Said she is feeling OK, no new concerns.   Objective: Vital signs in last 24 hours: Temp:  [98 F (36.7 C)-98.6 F (37 C)] 98 F (36.7 C) (04/02 0407) Pulse Rate:  [95-114] 96 (04/02 0407) Cardiac Rhythm: Normal sinus rhythm;Sinus tachycardia (04/01 1930) Resp:  [13-23] 20 (04/02 0407) BP: (94-133)/(53-74) 96/71 (04/02 0407) SpO2:  [85 %-98 %] 93 % (04/02 0407) Weight:  [83.3 kg] 83.3 kg (04/02 0523)    Intake/Output from previous day: 04/01 0701 - 04/02 0700 In: -  Out: 2600 [Urine:2600] Intake/Output this shift: No intake/output data recorded.  General appearance: alert, cooperative, and no distress Neurologic: intact Heart: RRR, monitor showing NSR with no arrhythmias Lungs: clear breath sounds, O2 sats acceptable on RA  Extremities: mild bilateral LE edema. The right leg EVH incision is well approximated and dry.  Wound: Breast binder in place, the sternotomy incision is well approximated and dry. Pacer wires are out.   Lab Results: Recent Labs    10/11/22 0156 10/12/22 0548  WBC 11.6* 7.8  HGB 6.9* 8.1*  HCT 20.6* 24.2*  PLT 149* 191   BMET:  Recent Labs    10/11/22 0156 10/12/22 0548  NA 135 132*  K 4.5 4.5  CL 97* 92*  CO2 33* 33*  GLUCOSE 104* 106*  BUN 8 9  CREATININE 0.63 0.63  CALCIUM 8.2* 8.0*    PT/INR: No results for input(s): "LABPROT", "INR" in the last 72 hours. ABG    Component Value Date/Time   PHART 7.347 (L) 10/08/2022  2025   HCO3 25.0 10/08/2022 2025   TCO2 26 10/08/2022 2025   ACIDBASEDEF 1.0 10/08/2022 2025   O2SAT 96 10/08/2022 2025   CBG (last 3)  Recent Labs    10/12/22 0344 10/12/22 0826 10/12/22 1152  GLUCAP 111* 118* 109*    Assessment/Plan: S/P Procedure(s) (LRB): CORONARY ARTERY BYPASS GRAFTING (CABG) X3 USING LEFT INTERNAL MAMMARY ARTERY AND RIGHT GREATER SAPHENOUS VEIN HARVESTED ENDOSCOPICALLY (N/A) PULMONARY VEIN ABLATION USING ATRICURE ISOLATOR SYNERGY ENCOMPASS OLH (N/A) TRANSESOPHAGEAL ECHOCARDIOGRAM (N/A) CLIPPING OF ATRIAL APPENDAGE USING ATRICURE ATRICLIP PRO2 SIZE 35 (N/A)  -POD5 CABG, pulm V. ablation, and LAA clip for CAD presenting with tachyarrhythmias and EF 50-55%.  On ASA, Lipitor, and metoprolol at low dose just started yesterday due to hypotension. Midodrine decreased to 5mg  TID yesterday.  SBP (94-112)  and HR stable. Progressing with ambulation.  -Expected acute blood loss anemia-- transfused 1 unit PRBCs in ICU, on Fe replacement  -PULM- currently on RA with good sats. CXR yesterday showed some basilar ATX. Encourage ambulation and pulm hygiene.  H/O tobacco abuse, currently on nicotine patch 21mg /24h.   -RENAL- normal renal function, Wt decreased by ~8lbs from yesterday and probably not accurate. 2.6L UO recorded.  Stilll has some peripheral edema. Continue diuresis with oral Lasix.   -DVT PPX- on enoxaparin, ambulate.  -Disposition- anticipate discharge to home in 1-2 days.    LOS: 5 days    [place]  Lorenza Burton, PA-C [phone] 10/13/2022   Chart reviewed, patient examined, agree with above. No BM since surgery. Will give lactulose today. Decrease nicotine patch to 14 mg. Continue diuresis.

## 2022-10-14 MED ORDER — GUAIFENESIN ER 600 MG PO TB12
600.0000 mg | ORAL_TABLET | Freq: Two times a day (BID) | ORAL | Status: DC
  Administered 2022-10-14 – 2022-10-15 (×3): 600 mg via ORAL
  Filled 2022-10-14 (×3): qty 1

## 2022-10-14 MED FILL — Mannitol IV Soln 20%: INTRAVENOUS | Qty: 250 | Status: AC

## 2022-10-14 MED FILL — Heparin Sodium (Porcine) Inj 1000 Unit/ML: Qty: 1000 | Status: AC

## 2022-10-14 MED FILL — Heparin Sodium (Porcine) Inj 1000 Unit/ML: INTRAMUSCULAR | Qty: 10 | Status: AC

## 2022-10-14 MED FILL — Albumin, Human Inj 5%: INTRAVENOUS | Qty: 250 | Status: AC

## 2022-10-14 MED FILL — Lidocaine HCl Local Preservative Free (PF) Inj 2%: INTRAMUSCULAR | Qty: 14 | Status: AC

## 2022-10-14 MED FILL — Magnesium Sulfate Inj 50%: INTRAMUSCULAR | Qty: 10 | Status: AC

## 2022-10-14 MED FILL — Sodium Bicarbonate IV Soln 8.4%: INTRAVENOUS | Qty: 50 | Status: AC

## 2022-10-14 MED FILL — Sodium Chloride IV Soln 0.9%: INTRAVENOUS | Qty: 2000 | Status: AC

## 2022-10-14 MED FILL — Electrolyte-R (PH 7.4) Solution: INTRAVENOUS | Qty: 5000 | Status: AC

## 2022-10-14 MED FILL — Lidocaine HCl Local Soln Prefilled Syringe 100 MG/5ML (2%): INTRAMUSCULAR | Qty: 5 | Status: AC

## 2022-10-14 MED FILL — Potassium Chloride Inj 2 mEq/ML: INTRAVENOUS | Qty: 40 | Status: AC

## 2022-10-14 NOTE — Progress Notes (Addendum)
[address]       [place] [address]             [phone]      6 Days Post-Op Procedure(s) (LRB): CORONARY ARTERY BYPASS GRAFTING (CABG) X3 USING LEFT INTERNAL MAMMARY ARTERY AND RIGHT GREATER SAPHENOUS VEIN HARVESTED ENDOSCOPICALLY (N/A) PULMONARY VEIN ABLATION USING ATRICURE ISOLATOR SYNERGY ENCOMPASS OLH (N/A) TRANSESOPHAGEAL ECHOCARDIOGRAM (N/A) CLIPPING OF ATRIAL APPENDAGE USING ATRICURE ATRICLIP PRO2 SIZE 35 (N/A) Subjective:  Resting in bed, feels short of breath at tiimes.  Used O2 last night for sats in the 80's. Has been on RA since around 0600 and SpO2 is currently 87-90. Has a coarse cough.   Objective: Vital signs in last 24 hours: Temp:  [97.9 F (36.6 C)-98.4 F (36.9 C)] 97.9 F (36.6 C) (04/03 0416) Pulse Rate:  [90-100] 96 (04/03 0416) Cardiac Rhythm: Normal sinus rhythm (04/03 0345) Resp:  [18-20] 20 (04/03 0416) BP: (89-112)/(50-75) 107/63 (04/03 0416) SpO2:  [92 %-100 %] 100 % (04/03 0416) Weight:  [83.1 kg] 83.1 kg (04/03 0416)    Intake/Output from previous day: 04/02 0701 - 04/03 0700 In: 1160 [P.O.:1160] Out: 500 [Urine:500] Intake/Output this shift: No intake/output data recorded.  General appearance: alert, cooperative, and no distress Neurologic: intact Heart: RRR, monitor showing NSR. S. Tach last night ~115/min. Lungs: coarse breath sounds Extremities: mild bilateral LE edema. The right leg EVH incision is well approximated and dry.  Wound: Breast binder in place, the sternotomy incision is well approximated and dry.   Lab Results: Recent Labs    10/12/22 0548  WBC 7.8  HGB 8.1*  HCT 24.2*  PLT 191    BMET:  Recent Labs    10/12/22 0548  NA 132*  K 4.5  CL 92*  CO2 33*  GLUCOSE 106*  BUN 9  CREATININE 0.63  CALCIUM 8.0*     PT/INR: No results for input(s): "LABPROT", "INR" in the last 72 hours. ABG    Component Value Date/Time   PHART 7.347 (L) 10/08/2022 2025   HCO3 25.0 10/08/2022  2025   TCO2 26 10/08/2022 2025   ACIDBASEDEF 1.0 10/08/2022 2025   O2SAT 96 10/08/2022 2025   CBG (last 3)  Recent Labs    10/12/22 0344 10/12/22 0826 10/12/22 1152  GLUCAP 111* 118* 109*     Assessment/Plan: S/P Procedure(s) (LRB): CORONARY ARTERY BYPASS GRAFTING (CABG) X3 USING LEFT INTERNAL MAMMARY ARTERY AND RIGHT GREATER SAPHENOUS VEIN HARVESTED ENDOSCOPICALLY (N/A) PULMONARY VEIN ABLATION USING ATRICURE ISOLATOR SYNERGY ENCOMPASS OLH (N/A) TRANSESOPHAGEAL ECHOCARDIOGRAM (N/A) CLIPPING OF ATRIAL APPENDAGE USING ATRICURE ATRICLIP PRO2 SIZE 35 (N/A)  -POD6 CABG, pulm V. ablation, and LAA clip for CAD presenting with tachyarrhythmias and EF 50-55%.  On ASA, Lipitor, and metoprolol at low dose. Midodrine is at 5mg  TID  SBP (90-110) and HR stable. Progressing with ambulation.  -Expected acute blood loss anemia-- transfused 1 unit PRBCs in ICU, on Fe replacement  -PULM- has smoked cigarettes for many years. Became hypoxic last night and O2 resumed at 2L.   Encouraging ambulation and pulm hygiene. May need O2 at home, will do 6 min walk test today.  Add Mucinex.   -RENAL- normal renal function, Wt now ~2Lbs+. Continue diuresis with oral Lasix.   -DVT PPX- on enoxaparin, ambulate.  -Disposition- continue diuresis and lung work today. Evaluate need for home O2. Plan for discharge tomorrow.    LOS: 6 days    [name], [place] [phone] 10/14/2022  Chart reviewed, patient examined, agree with above. I think she will need oxygen for a while. She had some desaturation at the end of the pump run once off bypass that responded to lung recruitment likely related to her heavy smoking hx. Otherwise she looks good.

## 2022-10-14 NOTE — Progress Notes (Signed)
CARDIAC REHAB PHASE I   PRE:  Rate/Rhythm: 100 ST  BP:  Sitting: 107/63      SaO2: 90% RA  MODE:  Ambulation: 400 ft   AD:  None  POST:  Rate/Rhythm: 109 ST  BP:  Sitting: 114/76      SaO2: 94 1L  During walk test pt SaO2 dropped to 84% with slow walking and recovered to 90% with rest. Before walking SaO2 dropped again to 84% and I elected to put pt on 1L before walking. Pt SaO2 recovered to 94% and we walked to her room w/o any issues.   Pt is concerned with her continuing to have DOE, pt thought CABG would help but she feels that her issue is related to her lungs. I relayed that it could be d/t tobacco abuse or deconditioning and that she should follow up with her PCP to have it addressed.   Pt received OHS book and education on restrictions, heart healthy diet, ex guidelines, Move in the Tube sheet, incentive spirometer use when d/c and CRPII. Pt denies questions and was encouraged to look in the book for additional information. Referral placed to [facility].   Christen Bame  9:57 AM 10/14/2022    Service time is from 0909 to 1013.

## 2022-10-14 NOTE — Progress Notes (Signed)
SATURATION QUALIFICATIONS: (This note is used to comply with regulatory documentation for home oxygen)   Patient Saturations on Room Air at Rest = 90%   Patient Saturations on Room Air while Ambulating = 84%   Patient Saturations on 1 Liters of oxygen while Ambulating = 94  Pt would benefit from home O2 based on desaturations on RA with minimal exertion. See other note for additional details.   [name] 10/14/2022 9:57 AM'

## 2022-10-14 NOTE — TOC Initial Note (Addendum)
Transition of Care (TOC) - Initial/Assessment Note  Janet Gibbons RN, BSN Transitions of Care Unit 4E- RN Case Manager See Treatment Team for direct phone #   Patient Details  Name: [name] MRN: [id number] Date of Birth: 27-Mar-1971  Transition of Care [facility]) CM/SW Contact:    Janet Patricia, RN Phone Number: 10/14/2022, 4:28 PM  Clinical Narrative:                 CM spoke with pt at bedside regarding [place] and DME needs.  TOC has been notified by [name] that they have office protocol referral for [facility] needs- and are following for discharge- discussed this with pt who is agreeable. Pt asked able home management needs such as laundry, cleaning, etch- explained that [facility] would not assist with these things only therapy and nursing skilled needs that insurance covers- home management is not covered under insurance and would be out of pocket expense. Pt voiced her daughter plans to stay with her to assist.   Per cardiac rehab pt needs rollator for home and is also considering shower chair and BSC. Discussed insurance coverage for DME and that shower chair and BSC would be out of pocket cost- pt to f/u on her own to get these if she decides she needs them. Pt confirmed she would like to get rollator if the size is what she wants. Explained size is guided by height and weight. Pt will also need home 02- per pt she does not have a preference for DME provider- voiced she would like something "light" as far as portable home 02.   Address, phone # and PCP all confirmed in epic.   CM will f/u with DME referral for rollator and home 02 - will check to see if [facility] can service.   Expected Discharge Plan: [place] Barriers to Discharge: Continued Medical Work up   Patient Goals and CMS Choice Patient states their goals for this hospitalization and ongoing recovery are:: return home CMS Medicare.gov Compare Post Acute Care list provided to:: Patient Choice offered to / list  presented to : Patient      Expected Discharge Plan and Services   Discharge Planning Services: CM Consult Post Acute Care Choice: Home Health, Durable Medical Equipment Living arrangements for the past 2 months: Single Family Home                 DME Arranged: Oxygen, Walker rolling with seat DME Agency: [place] Date DME Agency Contacted: 10/15/22     HH Arranged: RN, PT [place] Agency: [place] Date HH Agency Contacted: 10/15/22   Representative spoke with at [place]: [name]  Prior Living Arrangements/Services Living arrangements for the past 2 months: [place] Lives with:: Self, Adult Children Patient language and need for interpreter reviewed:: Yes Do you feel safe going back to the place where you live?: Yes      Need for Family Participation in Patient Care: Yes (Comment) Care giver support system in place?: Yes (comment)   Criminal Activity/Legal Involvement Pertinent to Current Situation/Hospitalization: No - Comment as needed  Activities of Daily Living Home Assistive Devices/Equipment: Eyeglasses, Blood pressure cuff, Scales ADL Screening (condition at time of admission) Patient's cognitive ability adequate to safely complete daily activities?: Yes Is the patient deaf or have difficulty hearing?: No Does the patient have difficulty seeing, even when wearing glasses/contacts?: No Does the patient have difficulty concentrating, remembering, or making decisions?: No Patient able to  express need for assistance with ADLs?: Yes Does the patient have difficulty dressing or bathing?: No Independently performs ADLs?: Yes (appropriate for developmental age) Does the patient have difficulty walking or climbing stairs?: No Weakness of Legs: None Weakness of Arms/Hands: None  Permission Sought/Granted Permission sought to share information with : Facility Art therapist granted to share information with : Yes, Verbal Permission  Granted     Permission granted to share info w AGENCY: HH/DME        Emotional Assessment Appearance:: Appears stated age Attitude/Demeanor/Rapport: Engaged Affect (typically observed): Accepting, Appropriate Orientation: : Oriented to Self, Oriented to Place, Oriented to  Time, Oriented to Situation Alcohol / Substance Use: Not Applicable Psych Involvement: No (comment)  Admission diagnosis:  S/P CABG x 4 [Z95.1] Patient Active Problem List   Diagnosis Date Noted   S/P CABG x 4 10/08/2022   Cardiomyopathy 09/07/2022   Abnormal cardiac CT angiography 09/07/2022   S/P lumbar fusion 06/28/2020   Chronic back pain 06/25/2020   Polyarthralgia 01/05/2013   Myofascial pain dysfunction syndrome 08/28/2011   PCP:  [name], MD Pharmacy:   [id number] [address] [place] [address] Phone: [phone] Fax: [phone]  [id number] [place], [place] [address] Phone: [phone] Fax: [phone]     Social Determinants of Health ([place]) Social History: [place]: No Food Insecurity (10/08/2022)  Housing: Low Risk  (10/08/2022)  Transportation Needs: No Transportation Needs (10/08/2022)  Utilities: Not At Risk (10/08/2022)  Tobacco Use: High Risk (10/13/2022)   SDOH Interventions: Food Insecurity Interventions: Intervention Not Indicated Housing Interventions: Intervention Not Indicated Transportation Interventions: Intervention Not Indicated Utilities Interventions: Intervention Not Indicated   Readmission Risk Interventions     No data to display

## 2022-10-15 ENCOUNTER — Other Ambulatory Visit (HOSPITAL_COMMUNITY): Payer: Self-pay

## 2022-10-15 MED ORDER — TRAMADOL HCL 50 MG PO TABS
50.0000 mg | ORAL_TABLET | ORAL | 0 refills | Status: AC | PRN
  Filled 2022-10-15: qty 30, 5d supply, fill #0

## 2022-10-15 MED ORDER — GUAIFENESIN ER 600 MG PO TB12
600.0000 mg | ORAL_TABLET | Freq: Two times a day (BID) | ORAL | 0 refills | Status: DC
  Filled 2022-10-15: qty 14, 7d supply, fill #0

## 2022-10-15 MED ORDER — NICOTINE 14 MG/24HR TD PT24
14.0000 mg | MEDICATED_PATCH | Freq: Every day | TRANSDERMAL | 0 refills | Status: DC
  Filled 2022-10-15: qty 21, 21d supply, fill #0

## 2022-10-15 MED ORDER — LACTULOSE 10 GM/15ML PO SOLN
20.0000 g | Freq: Every day | ORAL | 1 refills | Status: DC | PRN
  Filled 2022-10-15: qty 237, 7d supply, fill #0

## 2022-10-15 MED ORDER — FE FUM-VIT C-VIT B12-FA 460-60-0.01-1 MG PO CAPS
1.0000 | ORAL_CAPSULE | Freq: Every day | ORAL | 0 refills | Status: DC
  Filled 2022-10-15: qty 30, 30d supply, fill #0

## 2022-10-15 MED ORDER — ASPIRIN 325 MG PO TBEC
325.0000 mg | DELAYED_RELEASE_TABLET | Freq: Every day | ORAL | 1 refills | Status: DC
  Filled 2022-10-15: qty 100, 100d supply, fill #0

## 2022-10-15 MED ORDER — LACTULOSE 10 GM/15ML PO SOLN
20.0000 g | Freq: Once | ORAL | Status: AC
  Administered 2022-10-15: 20 g via ORAL
  Filled 2022-10-15: qty 30

## 2022-10-15 MED ORDER — MIDODRINE HCL 5 MG PO TABS
5.0000 mg | ORAL_TABLET | Freq: Three times a day (TID) | ORAL | 1 refills | Status: DC
  Filled 2022-10-15: qty 60, 20d supply, fill #0

## 2022-10-15 MED ORDER — ALBUTEROL SULFATE HFA 108 (90 BASE) MCG/ACT IN AERS
2.0000 | INHALATION_SPRAY | RESPIRATORY_TRACT | 1 refills | Status: AC | PRN
  Filled 2022-10-15: qty 6.7, 16d supply, fill #0

## 2022-10-15 NOTE — Progress Notes (Signed)
Mews not activated on patient as patient was up ambulating in the room and not at rest.  10/15/22 0036  Assess: MEWS Score  Temp 98.6 F (37 C)  BP 106/72  MAP (mmHg) 82  Pulse Rate (!) 102  ECG Heart Rate (!) 102  Resp 15  Level of Consciousness Alert  SpO2 95 %  Assess: MEWS Score  MEWS Temp 0  MEWS Systolic 0  MEWS Pulse 1  MEWS RR 0  MEWS LOC 0  MEWS Score 1  MEWS Score Color Green  Assess: SIRS CRITERIA  SIRS Temperature  0  SIRS Pulse 1  SIRS Respirations  0  SIRS WBC 0  SIRS Score Sum  1

## 2022-10-15 NOTE — Progress Notes (Addendum)
Patient given discharge instructions medicaitonlist and follow up appointments.Patient has equipment and oxygen delivered prior to discharge, medication sent to [place] [facility].  IV and tele were dcd. Will discharge home as ordered. Holleigh Crihfield, Bettina Gavia Rn

## 2022-10-15 NOTE — Progress Notes (Signed)
Discussed progressive walking and rehab with pt.

## 2022-10-15 NOTE — Progress Notes (Addendum)
      [address]       [place] [address]             [phone]      7 Days Post-Op Procedure(s) (LRB): CORONARY ARTERY BYPASS GRAFTING (CABG) X3 USING LEFT INTERNAL MAMMARY ARTERY AND RIGHT GREATER SAPHENOUS VEIN HARVESTED ENDOSCOPICALLY (N/A) PULMONARY VEIN ABLATION USING ATRICURE ISOLATOR SYNERGY ENCOMPASS OLH (N/A) TRANSESOPHAGEAL ECHOCARDIOGRAM (N/A) CLIPPING OF ATRIAL APPENDAGE USING ATRICURE ATRICLIP PRO2 SIZE 35 (N/A) Subjective:  Resting in bed, no new concerns. Would like to return home today.  Had 1 BM since surgery, asked for another dose of lactulose this morning.   Objective: Vital signs in last 24 hours: Temp:  [97.9 F (36.6 C)-98.6 F (37 C)] 98.6 F (37 C) (04/04 0539) Pulse Rate:  [64-111] 103 (04/04 0539) Cardiac Rhythm: Sinus tachycardia (04/03 2110) Resp:  [15-20] 20 (04/04 0600) BP: (95-117)/(51-76) 109/69 (04/04 0539) SpO2:  [93 %-100 %] 94 % (04/04 0539) Weight:  [82.1 kg] 82.1 kg (04/04 0539)    Intake/Output from previous day: No intake/output data recorded. Intake/Output this shift: No intake/output data recorded.  General appearance: alert, cooperative, and no distress Neurologic: intact Heart: SR / ST. Lungs: few bilateral basilar crackles Extremities: mild RLE edema, none in left leg. The right leg EVH incision is well approximated and dry.  Wound: Breast binder in place, the sternotomy incision is healing with no sign of complication.  Lab Results: No results for input(s): "WBC", "HGB", "HCT", "PLT" in the last 72 hours.  BMET:  No results for input(s): "NA", "K", "CL", "CO2", "GLUCOSE", "BUN", "CREATININE", "CALCIUM" in the last 72 hours.   PT/INR: No results for input(s): "LABPROT", "INR" in the last 72 hours. ABG    Component Value Date/Time   PHART 7.347 (L) 10/08/2022 2025   HCO3 25.0 10/08/2022 2025   TCO2 26 10/08/2022 2025   ACIDBASEDEF 1.0 10/08/2022 2025   O2SAT 96 10/08/2022 2025   CBG (last 3)   Recent Labs    10/12/22 0826 10/12/22 1152  GLUCAP 118* 109*     Assessment/Plan: S/P Procedure(s) (LRB): CORONARY ARTERY BYPASS GRAFTING (CABG) X3 USING LEFT INTERNAL MAMMARY ARTERY AND RIGHT GREATER SAPHENOUS VEIN HARVESTED ENDOSCOPICALLY (N/A) PULMONARY VEIN ABLATION USING ATRICURE ISOLATOR SYNERGY ENCOMPASS OLH (N/A) TRANSESOPHAGEAL ECHOCARDIOGRAM (N/A) CLIPPING OF ATRIAL APPENDAGE USING ATRICURE ATRICLIP PRO2 SIZE 35 (N/A)  -POD7 CABG, pulm V. ablation, and LAA clip for CAD presenting with tachyarrhythmias and EF 50-55%.  On ASA, Lipitor, and metoprolol at low dose. Continue midodrine is at 5mg  TID  SBP (95-117) and HR stable.   -Expected acute blood loss anemia-- transfused 1 unit PRBCs in ICU, on Fe replacement  -PULM- long h/o tobacco use and tends to be hypoxic.  Arranged for home O2 absed on walk test yesterday.    Encouraging ambulation and pulm hygiene. Continue Mucinex.  -RENAL- normal renal function, down to pre-op Wt.   -DVT PPX- on enoxaparin, ambulating.  -Disposition- plan for discharge to home today with home O2 and rolling walker. Instructions given and follow up arranged.  Suture removal next week.    LOS: 7 days    [name] [id number] 10/15/2022   Chart reviewed, patient examined, agree with above. She looks good overall. Maintaining sinus rhythm. Walk test done yesterday shows that she need oxygen at home for a while. Encouraged her to stay away from smoking. Wt is back to baseline. I think she can go home today.

## 2022-10-15 NOTE — TOC Transition Note (Signed)
Transition of Care (TOC) - CM/SW Discharge Note Marvetta Gibbons RN, BSN Transitions of Care Unit 4E- RN Case Manager See Treatment Team for direct phone #   Patient Details  Name: [name] MRN: [id number] Date of Birth: 08-18-1970  Transition of Care [facility]) CM/SW Contact:  Dawayne Patricia, RN Phone Number: 10/15/2022, 2:42 PM   Clinical Narrative:    Pt stable for transition home today. DME has been set up with [employer], liaison contacted for ETA on delivery- and voiced he is on his way here now with portable 02 and rollator- should be here within the hour. [facility] notified for start of care   No further TOC needs noted, pt has transportation home.    Final next level of care: Home w Home Health Services Barriers to Discharge: Barriers Resolved   Patient Goals and CMS Choice CMS Medicare.gov Compare Post Acute Care list provided to:: Patient Choice offered to / list presented to : Patient  Discharge Placement                 Home w/ [facility]        Discharge Plan and Services Additional resources added to the After Visit Summary for     Discharge Planning Services: CM Consult Post Acute Care Choice: Home Health, Durable Medical Equipment          DME Arranged: Oxygen, Walker rolling with seat DME Agency: [place] Date DME Agency Contacted: 10/14/22 Time DME Agency Contacted: 1600 Representative spoke with at DME Agency: [facility] HH Arranged: RN, PT [facility] Agency: [place] Date [place]: 10/15/22   Representative spoke with at [place]: [place] ([place]) Interventions SDOH Screenings   Food Insecurity: No Food Insecurity (10/08/2022)  Housing: Low Risk  (10/08/2022)  Transportation Needs: No Transportation Needs (10/08/2022)  Utilities: Not At Risk (10/08/2022)  Tobacco Use: High Risk (10/13/2022)     Readmission Risk Interventions     No data to display

## 2022-10-19 ENCOUNTER — Ambulatory Visit: Payer: Self-pay | Admitting: *Deleted

## 2022-10-19 VITALS — BP 104/64 | HR 113 | Resp 20 | Wt 178.0 lb

## 2022-10-19 DIAGNOSIS — Z4802 Encounter for removal of sutures: Secondary | ICD-10-CM

## 2022-10-19 NOTE — Progress Notes (Signed)
Patient arrived for nurse visit to remove sutures post-CABG 3/28 by Dr. Laneta Simmers.  Sutures removed with no signs or symptoms of infection noted.  Incisions well approximated.Patient tolerated suture removal well.  Per patient, her BP has been running low at home. Patient reports systolic BP to be in the double digits consistently. Patient reports sheis not taking any of her BP medications. Advised patient the indicated use of the midodrine and metoprolol. Patient states she feels as if she needs a diuretic. No visible signs of edema in patient's lower extremities. Patient reports SOB but states she is on oxygen at home when ambulating. Oxygen was not utilized during appt. VS during visit 178lb, BP 104/64, HR 113, O2 97% on RA. Discussed clinical findings with PA, D. Joycelyn Man. Patient advised to take the midodrine and metoprolol as prescribed. Advised patient there are not any indications at this time for a diuretic. Advised patient to contact our office if weight increases or shows signs of edema. Patient verbalized understanding.

## 2022-11-01 NOTE — Progress Notes (Unsigned)
Office Visit    Patient Name: [name] Date of Encounter: 11/02/2022  PCP:  Johny Blamer, MD   [facility]  Cardiologist:  Verne Carrow, MD  Advanced Practice Provider:  No care team member to display Electrophysiologist:  None   HPI    [name] is a 52 y.o. female with a past medical history significant for anxiety, fibromyalgia, GERD, arthritis, HLD, rheumatoid arthritis, tobacco abuse presents today for overdue follow-up appointment.  She was seen initially March 2021.  She had palpitations at home.  She felt like her heart was skipping a beat.  She also had dyspnea on exertion.  No chest pain.  She was very anxious.  She smokes 1 pack a day.  She had a normal exercise stress test March 2021.  Echo March 2021 with LVEF 50 to 55%.  No valve disease.  Cardiac monitor April 2021 with sinus rhythm, 2 short runs of VT (longest 9 beats), 3 short runs of SVT (longest 12 beats).  She is here for overdue follow-up.  She was last seen April 2021 and at that time she denied any CV symptoms.  She does have some palpitations and was having severe back pain at that time.  She was last seen 06/18/22 by myself and she told me that she had an episode of a racing heartbeat and felt so bad that she immediately reported it to a fire station.  There, they did an EKG and told her she was in atrial fibrillation and advised that she go to the ED for further evaluation.  She has had other episodes of this racing heartbeat.  She also sometimes endorses a feeling of her heart stopping and her waiting for it to restart.  She was taken off her Cardizem but she cannot remember why.  She also states that her blood pressure has been elevated especially her diastolic blood pressure.  Reports 170/100 and 150/100.  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  She does endorse insignificant  bleeding from the stones.  This has been going on for 5 years per patient report.  She was seen by me 07/24/21, She had a significant finding on her echocardiogram which revealed a reduced EF and some wall regional motion abnormality.  A coronary CTA scan will be arranged today.  She still is having some SOB. She has felt horrible from a URI/possible bronchitis. She went to her PCP's office and they did not want to start her on anything due to her Afib and the workup that we are doing. We discussed next steps will be determined after the CT scan. She can place the zio (which she already has) after her coronary CT scan. Her BP is elevated today here in the clinic and has been at home per the patient. She thought it was due to the Eliquis however, I explained that Eliquis should not affect BP. We discussed increasing her Cardizem today.  I saw her back in January 2024 and she states that she is under a lot of stress and her husband makes it worse.  She states that stress from taking care of him has started all of these health problems for her.  She is worried that she will drop dead.  She did take some magnesium and potassium and it relieved the pressure in her chest.  She is also on aspirin 325 mg.  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  She has got her husband off financially and emotionally because he is pulling her down from a health standpoint.  She is still having chest pains and a lot of shortness of breath.  Her shortness of breath is not proportional to any of the cardiac findings as far.  We reviewed her CT scan as well as her echocardiogram again.  She states that she just is running out of oxygen.  No swelling.  Weight has been stable.  She also is short of breath when eating and bending over.  She was given a large dose of metoprolol for the CT scan and it did not touch her heart rate.  She then needed to take a few nitroglycerin tabs and that lowered her heart rate.  She was seen by me 09/01/22,  she shares with me that he is very fatigued. She is limiting her activity and gets out of breath easily. Scared to get on a treadmill or do any physical activity.  We have had issues receiving her ZIO monitor x 2. Once in December we never got results and now her monitor from January. She tells me she dropped off at [employer].  There has been some miscommunication.  I have asked her to contact [employer] to see what happened.  We also discussed cardiac catheterization to determine what is causing her symptoms.  Her heart rate remains elevated today.  She remains on Cardizem for blood pressure and heart rate control.  We have started her on daily Lipitor and we will obtain an updated lipid panel.  Cardiac cath on 09/07/2022 showed sequential 90% and 70% lesions in the LAD flanking and aneurysmal segment where a large second diagonal branch arises from.  Second diagonal branch had 70% proximal stenosis.  The right coronary artery had segmental 60% concentric mid vessel stenosis.  The RFR in the mid RCA was 0.94.  RFR of the LAD was 0.46.  Left heart filling pressures were elevated with LVEDP of 17 and pulmonary capillary wedge of 13.  Right heart pressures were normal.  She underwent elective coronary bypass grafting x 4 on 10/08/2022 without complication.  She was started on nicotine patch per request.  She had significant volume excess.  She was eventually diuresed back to baseline.  A 6-minute walk test confirmed the need for supplemental oxygen at home.  Otherwise, uneventful hospitalization.  Today, she tells me that she is having some nausea and vomiting.  She experienced this before surgery and that usually happens in the morning.  She stated in the hospital she was drinking some apple juice and was choking.  She had to get assistance right away.  She states that she is taking her omeprazole.  She also tells me that she has had a lot of pins and needle feeling in her leg and in her chest since surgery.  She was given  gabapentin to take and she has been requiring this every few hours.  She plans to discuss this with the surgeon on Wednesday.  She does not feel much difference at this current moment but hopes to feel better in a couple of weeks once she has recovered.  She states she has not had normal bowel movement since being home and says she does not of the gallbladder they tend to be mostly bile.  We discussed adding on a KUB since she is getting her chest x-ray today that way Dr. Laneta Simmers can look it over when  she sees him.  Reports no shortness of breath nor dyspnea on exertion. Reports no chest pain, pressure, or tightness. No edema, orthopnea, PND. Reports no palpitations.   Past Medical History    Past Medical History:  Diagnosis Date   A-fib    Anxiety    Arthralgia    Asthma    Attention-deficit hyperactivity disorder, predominantly inattentive type    Back pain    Dysrhythmia    Afib   Fear of flying    Fibromyalgia    Gastroesophageal reflux disease    HPV in female    Joint pain    Localized edema    Neck pain    OA (osteoarthritis)    Papillomavirus as the cause of diseases classified elsewhere    Primary insomnia    Pure hypercholesterolemia    Rheumatoid arthritis, seronegative, hand, unspecified laterality    patient feels it is back related, not a true arthritis   Spondylolisthesis of lumbosacral region    Tobacco dependence    Unspecified inflammatory spondylopathy, cervical region    Vitamin B12 deficiency anemia    Voice hoarseness    Past Surgical History:  Procedure Laterality Date   ABDOMINAL EXPOSURE N/A 06/28/2020   Procedure: ABDOMINAL EXPOSURE;  Surgeon: Cephus Shelling, MD;  Location: [facility] OR;  Service: Vascular;  Laterality: N/A;  anterior   ANTERIOR LUMBAR FUSION N/A 06/28/2020   Procedure: ANTERIOR LUMBAR INTERBODY FUSION LUMBAR FIVE-SACRAL ONE.;  Surgeon: Tia Alert, MD;  Location: [facility] OR;  Service: Neurosurgery;  Laterality: N/A;  anterior approach    BREAST ENHANCEMENT SURGERY     CARPAL TUNNEL RELEASE Left    CARPAL TUNNEL RELEASE Right 06/28/2020   Procedure: CARPAL TUNNEL RELEASE;  Surgeon: Tia Alert, MD;  Location: [facility] OR;  Service: Neurosurgery;  Laterality: Right;   CERVICAL SPINE SURGERY     CHOLECYSTECTOMY     CLIPPING OF ATRIAL APPENDAGE N/A 10/08/2022   Procedure: CLIPPING OF ATRIAL APPENDAGE USING ATRICURE ATRICLIP PRO2 SIZE 35;  Surgeon: Alleen Borne, MD;  Location: [facility] OR;  Service: Open Heart Surgery;  Laterality: N/A;   CORONARY ARTERY BYPASS GRAFT N/A 10/08/2022   Procedure: CORONARY ARTERY BYPASS GRAFTING (CABG) X3 USING LEFT INTERNAL MAMMARY ARTERY AND RIGHT GREATER SAPHENOUS VEIN HARVESTED ENDOSCOPICALLY;  Surgeon: Alleen Borne, MD;  Location: [facility] OR;  Service: Open Heart Surgery;  Laterality: N/A;   CORONARY PRESSURE/FFR STUDY N/A 09/07/2022   Procedure: INTRAVASCULAR PRESSURE WIRE/FFR STUDY;  Surgeon: Yvonne Kendall, MD;  Location: [facility] INVASIVE CV LAB;  Service: Cardiovascular;  Laterality: N/A;   FOOT SURGERY     MANDIBLE SURGERY     MAZE N/A 10/08/2022   Procedure: PULMONARY VEIN ABLATION USING ATRICURE ISOLATOR SYNERGY ENCOMPASS OLH;  Surgeon: Alleen Borne, MD;  Location: [facility] OR;  Service: Open Heart Surgery;  Laterality: N/A;   RIGHT/LEFT HEART CATH AND CORONARY ANGIOGRAPHY N/A 09/07/2022   Procedure: RIGHT/LEFT HEART CATH AND CORONARY ANGIOGRAPHY;  Surgeon: Yvonne Kendall, MD;  Location: [facility] INVASIVE CV LAB;  Service: Cardiovascular;  Laterality: N/A;   TEE WITHOUT CARDIOVERSION N/A 10/08/2022   Procedure: TRANSESOPHAGEAL ECHOCARDIOGRAM;  Surgeon: Alleen Borne, MD;  Location: [facility] OR;  Service: Open Heart Surgery;  Laterality: N/A;   TONSILLECTOMY     TUBAL LIGATION     with ablation   TYMPANOSTOMY TUBE PLACEMENT      Allergies  Allergies  Allergen Reactions   Zoloft [Sertraline] Other (See Comments)    Altered mental state  EKGs/Labs/Other Studies Reviewed:   The following studies were  reviewed today:  Echocardiogram 07/20/2021  IMPRESSIONS     1. Left ventricular ejection fraction, by estimation, is 35 to 40%. The  left ventricle has moderately decreased function. The left ventricle  demonstrates regional wall motion abnormalities (see scoring  diagram/findings for description). The left  ventricular internal cavity size was mildly dilated. Left ventricular  diastolic parameters are indeterminate. Mild global hypokinesis with  moderate hypokinesis of septal wall.   2. Right ventricular systolic function is normal. The right ventricular  size is normal.   3. Left atrial size was moderately dilated.   4. The mitral valve is normal in structure. Mild to moderate mitral valve  regurgitation. No evidence of mitral stenosis.   5. The aortic valve is grossly normal. There is mild calcification of the  aortic valve. Aortic valve regurgitation is not visualized. Aortic valve  sclerosis is present, with no evidence of aortic valve stenosis.   6. The inferior vena cava is normal in size with greater than 50%  respiratory variability, suggesting right atrial pressure of 3 mmHg.   7. Cannot exclude a small PFO.   Comparison(s): Prior images reviewed side by side. Changes from prior  study are noted. Prior images with normal EF, maybe minimal abnormal  septal motion. EF reduced and septal motion worse compared to prior.   Conclusion(s)/Recommendation(s): Significantly changed compared to prior,  will contact Jari Favre and Dr. Clifton James.   FINDINGS   Left Ventricle: Left ventricular ejection fraction, by estimation, is 35  to 40%. The left ventricle has moderately decreased function. The left  ventricle demonstrates regional wall motion abnormalities. Moderate  hypokinesis of the left ventricular,  entire anteroseptal wall and inferoseptal wall. Global longitudinal strain  performed but not reported based on interpreter judgement due to  suboptimal tracking. 3D left  ventricular ejection fraction analysis  performed but not reported based on  interpreter judgement due to suboptimal tracking. The left ventricular  internal cavity size was mildly dilated. There is no left ventricular  hypertrophy. Left ventricular diastolic parameters are indeterminate.     LV Wall Scoring:  Mild global hypokinesis with moderate hypokinesis of septal wall.   Right Ventricle: The right ventricular size is normal. No increase in  right ventricular wall thickness. Right ventricular systolic function is  normal.   Left Atrium: Left atrial size was moderately dilated.   Right Atrium: Right atrial size was normal in size.   Pericardium: There is no evidence of pericardial effusion.   Mitral Valve: The mitral valve is normal in structure. There is mild  thickening of the mitral valve leaflet(s). Mild to moderate mitral valve  regurgitation. No evidence of mitral valve stenosis.   Tricuspid Valve: The tricuspid valve is normal in structure. Tricuspid  valve regurgitation is trivial. No evidence of tricuspid stenosis.   Aortic Valve: The aortic valve is grossly normal. There is mild  calcification of the aortic valve. Aortic valve regurgitation is not  visualized. Aortic valve sclerosis is present, with no evidence of aortic  valve stenosis.   Pulmonic Valve: The pulmonic valve was grossly normal. Pulmonic valve  regurgitation is trivial. No evidence of pulmonic stenosis.   Aorta: The aortic root, ascending aorta, aortic arch and descending aorta  are all structurally normal, with no evidence of dilitation or  obstruction.   Venous: The inferior vena cava is normal in size with greater than 50%  respiratory variability, suggesting right atrial pressure of 3 mmHg.  IAS/Shunts: Cannot exclude a small PFO.    Echocardiogram 09/28/19 IMPRESSIONS     1. Left ventricular ejection fraction, by estimation, is 50 to 55%. The  left ventricle has low normal function.  The left ventricle has no regional  wall motion abnormalities. The left ventricular internal cavity size was  mildly dilated. Left ventricular  diastolic parameters are indeterminate. The average left ventricular  global longitudinal strain is -17.6 %.   2. Right ventricular systolic function is normal. The right ventricular  size is normal. There is normal pulmonary artery systolic pressure.   3. The mitral valve is normal in structure. Trivial mitral valve  regurgitation. No evidence of mitral stenosis.   4. The aortic valve is tricuspid. Aortic valve regurgitation is not  visualized. No aortic stenosis is present.   5. The inferior vena cava is normal in size with greater than 50%  respiratory variability, suggesting right atrial pressure of 3 mmHg.   FINDINGS   Left Ventricle: Left ventricular ejection fraction, by estimation, is 50  to 55%. The left ventricle has low normal function. The left ventricle has  no regional wall motion abnormalities. The average left ventricular global  longitudinal strain is -17.6  %. The left ventricular internal cavity size was mildly dilated. There is  no left ventricular hypertrophy. Left ventricular diastolic parameters are  indeterminate.   Right Ventricle: The right ventricular size is normal. No increase in  right ventricular wall thickness. Right ventricular systolic function is  normal. There is normal pulmonary artery systolic pressure. The tricuspid  regurgitant velocity is 2.03 m/s, and   with an assumed right atrial pressure of 3 mmHg, the estimated right  ventricular systolic pressure is 19.5 mmHg.   Left Atrium: Left atrial size was normal in size.   Right Atrium: Right atrial size was normal in size.   Pericardium: There is no evidence of pericardial effusion.   Mitral Valve: The mitral valve is normal in structure. Normal mobility of  the mitral valve leaflets. Trivial mitral valve regurgitation. No evidence  of mitral valve  stenosis.   Tricuspid Valve: The tricuspid valve is normal in structure. Tricuspid  valve regurgitation is trivial. No evidence of tricuspid stenosis.   Aortic Valve: The aortic valve is tricuspid. Aortic valve regurgitation is  not visualized. No aortic stenosis is present.   Pulmonic Valve: The pulmonic valve was normal in structure. Pulmonic valve  regurgitation is not visualized. No evidence of pulmonic stenosis.   Aorta: The aortic root is normal in size and structure.   Venous: The inferior vena cava is normal in size with greater than 50%  respiratory variability, suggesting right atrial pressure of 3 mmHg.   IAS/Shunts: No atrial level shunt detected by color flow Doppler.   Cardiac monitor 10/01/19  Sinus rhythm 2 episodes of ventricular tachycardia with longest at 9 beats.  Rare PVCs 3 short runs of supraventricular tachycardia, longest 12 beats  EKG:  EKG is  ordered today.  The ekg ordered today demonstrates sinus tachycardia rate 101 bpm  Recent Labs: 06/18/2022: TSH 0.652 10/06/2022: ALT 15 10/09/2022: Magnesium 2.2 10/12/2022: BUN 9; Creatinine, Ser 0.63; Hemoglobin 8.1; Platelets 191; Potassium 4.5; Sodium 132  Recent Lipid Panel    Component Value Date/Time   CHOL 230 (H) 09/01/2022 0932   TRIG 187 (H) 09/01/2022 0932   HDL 60 09/01/2022 0932   CHOLHDL 3.8 09/01/2022 0932   LDLCALC 137 (H) 09/01/2022 0932    Risk Assessment/Calculations:   CHA2DS2-VASc Score =  2   This indicates a 2.2% annual risk of stroke. The patient's score is based upon: CHF History: 0 HTN History: 1 Diabetes History: 0 Stroke History: 0 Vascular Disease History: 0 Age Score: 0 Gender Score: 1      Home Medications   Current Meds  Medication Sig   albuterol (VENTOLIN HFA) 108 (90 Base) MCG/ACT inhaler Inhale 2 puffs into the lungs every 4 (four) hours as needed for wheezing or shortness of breath.   amphetamine-dextroamphetamine (ADDERALL XR) 30 MG 24 hr capsule Take 60 mg  by mouth daily.   aspirin EC 325 MG tablet Take 1 tablet (325 mg total) by mouth daily.   atorvastatin (LIPITOR) 80 MG tablet Take 1 tablet (80 mg total) by mouth daily.   escitalopram (LEXAPRO) 10 MG tablet Take 10 mg by mouth daily as needed (anxiety).   Fe Fum-Vit C-Vit B12-FA (TRIGELS-F FORTE) CAPS capsule Take 1 capsule by mouth daily after breakfast for 1 month.   furosemide (LASIX) 20 MG tablet Take 1 tablet (20 mg total) by mouth daily as needed (for lower extremity edema).   gabapentin (NEURONTIN) 100 MG capsule Take 100-200 mg by mouth 4 (four) times daily as needed (pain).   gabapentin (NEURONTIN) 300 MG capsule Take 600 mg by mouth daily as needed (pain).   guaiFENesin (MUCINEX) 600 MG 12 hr tablet Take 1 tablet (600 mg total) by mouth 2 (two) times daily for 1 week.   lactulose (CHRONULAC) 10 GM/15ML solution Take 30 mLs (20 g total) by mouth daily as needed for mild constipation.   methocarbamol (ROBAXIN) 500 MG tablet Take 500 mg by mouth daily as needed for muscle spasms.   metoprolol succinate (TOPROL-XL) 25 MG 24 hr tablet Take 1 tablet (25 mg total) by mouth in the morning and at bedtime.   midodrine (PROAMATINE) 5 MG tablet Take 1 tablet (5 mg total) by mouth 3 (three) times daily with meals.   nicotine (NICODERM CQ - DOSED IN MG/24 HOURS) 14 mg/24hr patch Place 1 patch (14 mg total) onto the skin daily. For 3 weeks then reduce the dose to 7mg  patch topically daily (available over the counter)   omeprazole (PRILOSEC) 20 MG capsule Take 20 mg by mouth daily.   zolpidem (AMBIEN) 10 MG tablet Take 10 mg by mouth at bedtime.     Review of Systems      All other systems reviewed and are otherwise negative except as noted above.  Physical Exam    VS:  BP 106/72   Pulse (!) 118   Ht 5\' 2"  (1.575 m)   Wt 173 lb (78.5 kg)   SpO2 96%   BMI 31.64 kg/m  , BMI Body mass index is 31.64 kg/m.  Wt Readings from Last 3 Encounters:  11/02/22 173 lb (78.5 kg)  10/19/22 178 lb  (80.7 kg)  10/15/22 180 lb 14.4 oz (82.1 kg)     GEN: Well nourished, well developed, in no acute distress. HEENT: normal. Neck: Supple, no JVD, carotid bruits, or masses. Cardiac: RRR, no murmurs, rubs, or gallops. No clubbing, cyanosis, edema.  Radials/PT 2+ and equal bilaterally.  Respiratory:  Respirations regular and unlabored, RLL rhonchi. GI: Soft, nontender, nondistended. MS: No deformity or atrophy. Skin: Warm and dry, no rash. Neuro:  Strength and sensation are intact. Psych: Normal affect.  Assessment & Plan    CAD s/p CABG x4 -she is having continued nausea and vomiting, we have ordered a KUB today since she is having a  CXR today and follow-up with surgery on Wed -off Imdur due to hypotension, needing midodrine now for her blood pressure now  -Continue current medication regimen which includes   aspirin 325 mg daily, Lipitor 80 mg daily, metoprolol 25 mg daily, midodrine 5 mg 3 times daily, nitro as needed  Palpitations/SVT/PVCs -zio monitor back in Feb with no Afib and rare PVCs PACs -limit caffeine and alcohol -she has been off cigarettes and is using the patches    Documented afib -sinus tachycardia today, Cardizem stopped due to BP  -monitor in Feb without Afib -unable to increase BB due to BP -Eliquis  BID-on hold since surgery -CHA2DS2-VASc of 2 with 2.2% risk of stroke   4. Hypertension -BP low and requiring midodrine  TID -encouraged to keep track of her BP at home     Cardiac Rehabilitation Eligibility Assessment  The patient is ready to start cardiac rehabilitation pending clearance from the cardiac surgeon.   Disposition: Follow up 3-4 months with Verne Carrow, MD or APP.  Signed, Sharlene Dory, PA-C 11/02/2022, 11:17 AM [facility]

## 2022-11-02 ENCOUNTER — Ambulatory Visit
Admission: RE | Admit: 2022-11-02 | Discharge: 2022-11-02 | Disposition: A | Discharge status: Home / Self Care | Payer: BC Managed Care – PPO | Source: Ambulatory Visit | Attending: Physician Assistant | Admitting: Physician Assistant

## 2022-11-02 ENCOUNTER — Other Ambulatory Visit: Payer: Self-pay | Admitting: Surgery

## 2022-11-02 ENCOUNTER — Encounter: Payer: Self-pay | Admitting: Physician Assistant

## 2022-11-02 ENCOUNTER — Ambulatory Visit: Payer: BC Managed Care – PPO | Attending: Physician Assistant | Admitting: Physician Assistant

## 2022-11-02 VITALS — BP 106/72 | HR 118 | Ht 62.0 in | Wt 173.0 lb

## 2022-11-02 DIAGNOSIS — Z951 Presence of aortocoronary bypass graft: Secondary | ICD-10-CM

## 2022-11-02 DIAGNOSIS — R109 Unspecified abdominal pain: Secondary | ICD-10-CM

## 2022-11-02 DIAGNOSIS — I10 Essential (primary) hypertension: Secondary | ICD-10-CM

## 2022-11-02 DIAGNOSIS — I2510 Atherosclerotic heart disease of native coronary artery without angina pectoris: Secondary | ICD-10-CM

## 2022-11-02 DIAGNOSIS — R002 Palpitations: Secondary | ICD-10-CM | POA: Diagnosis not present

## 2022-11-02 DIAGNOSIS — I493 Ventricular premature depolarization: Secondary | ICD-10-CM

## 2022-11-02 DIAGNOSIS — I471 Supraventricular tachycardia, unspecified: Secondary | ICD-10-CM

## 2022-11-02 DIAGNOSIS — I4891 Unspecified atrial fibrillation: Secondary | ICD-10-CM

## 2022-11-02 MED ORDER — FUROSEMIDE 20 MG PO TABS: 20.0000 mg | ORAL_TABLET | Freq: Every day | ORAL | 3 refills | Status: DC | PRN

## 2022-11-02 NOTE — Patient Instructions (Addendum)
Medication Instructions:  1.Start furosemide (Lasix) 20 mg as needed for lower extremity edema 2.Purchase nicotine (Nicoderm CQ) 7 mg patches over the counter to apply topically daily *If you need a refill on your cardiac medications before your next appointment, please call your pharmacy*  Lab Work: None ordered If you have labs (blood work) drawn today and your tests are completely normal, you will receive your results only by: MyChart Message (if you have MyChart) OR A paper copy in the mail If you have any lab test that is abnormal or we need to change your treatment, we will call you to review the results.  Testing/Procedures: We have added an abdominal x-ray to be done today when you go to have your other x-ray done   Follow-Up: At [facility], you and your health needs are our priority.  As part of our continuing mission to provide you with exceptional heart care, we have created designated Provider Care Teams.  These Care Teams include your primary Cardiologist (physician) and Advanced Practice Providers (APPs -  Physician Assistants and Nurse Practitioners) who all work together to provide you with the care you need, when you need it.  Your next appointment:   3-4 month(s)  Provider:   Verne Carrow, MD  or APP  Other Instructions Check your blood pressure daily, 1-2 hrs after taking your morning medications for 2 weeks, keep a log and send [place] the readings through mychart at the end of the 2 weeks  Weigh every morning after using the restroom, before breakfast and let us know if you have a weight gain of 2 or more lbs overnight or 5 lbs or more in a week  Heart-Healthy Eating Plan Many factors influence your heart health, including eating and exercise habits. Heart health is also called coronary health. Coronary risk increases with abnormal blood fat (lipid) levels. A heart-healthy eating plan includes limiting unhealthy fats, increasing healthy fats, limiting  salt (sodium) intake, and making other diet and lifestyle changes. What is my plan? Your health care provider may recommend that: You limit your fat intake to _________% or less of your total calories each day. You limit your saturated fat intake to _________% or less of your total calories each day. You limit the amount of cholesterol in your diet to less than _________ mg per day. You limit the amount of sodium in your diet to less than _________ mg per day. What are tips for following this plan? Cooking Cook foods using methods other than frying. Baking, boiling, grilling, and broiling are all good options. Other ways to reduce fat include: Removing the skin from poultry. Removing all visible fats from meats. Steaming vegetables in water or broth. Meal planning  At meals, imagine dividing your plate into fourths: Fill one-half of your plate with vegetables and green salads. Fill one-fourth of your plate with whole grains. Fill one-fourth of your plate with lean protein foods. Eat 2-4 cups of vegetables per day. One cup of vegetables equals 1 cup (91 g) broccoli or cauliflower florets, 2 medium carrots, 1 large bell pepper, 1 large sweet potato, 1 large tomato, 1 medium white potato, 2 cups (150 g) raw leafy greens. Eat 1-2 cups of fruit per day. One cup of fruit equals 1 small apple, 1 large banana, 1 cup (237 g) mixed fruit, 1 large orange,  cup (82 g) dried fruit, 1 cup (240 mL) 100% fruit juice. Eat more foods that contain soluble fiber. Examples include apples, broccoli, carrots, beans,  peas, and barley. Aim to get 25-30 g of fiber per day. Increase your consumption of legumes, nuts, and seeds to 4-5 servings per week. One serving of dried beans or legumes equals  cup (90 g) cooked, 1 serving of nuts is  oz (12 almonds, 24 pistachios, or 7 walnut halves), and 1 serving of seeds equals  oz (8 g). Fats Choose healthy fats more often. Choose monounsaturated and polyunsaturated  fats, such as olive and canola oils, avocado oil, flaxseeds, walnuts, almonds, and seeds. Eat more omega-3 fats. Choose salmon, mackerel, sardines, tuna, flaxseed oil, and ground flaxseeds. Aim to eat fish at least 2 times each week. Check food labels carefully to identify foods with trans fats or high amounts of saturated fat. Limit saturated fats. These are found in animal products, such as meats, butter, and cream. Plant sources of saturated fats include palm oil, palm kernel oil, and coconut oil. Avoid foods with partially hydrogenated oils in them. These contain trans fats. Examples are stick margarine, some tub margarines, cookies, crackers, and other baked goods. Avoid fried foods. General information Eat more home-cooked food and less restaurant, buffet, and fast food. Limit or avoid alcohol. Limit foods that are high in added sugar and simple starches such as foods made using white refined flour (white breads, pastries, sweets). Lose weight if you are overweight. Losing just 5-10% of your body weight can help your overall health and prevent diseases such as diabetes and heart disease. Monitor your sodium intake, especially if you have high blood pressure. Talk with your health care provider about your sodium intake. Try to incorporate more vegetarian meals weekly. What foods should I eat? Fruits All fresh, canned (in natural juice), or frozen fruits. Vegetables Fresh or frozen vegetables (raw, steamed, roasted, or grilled). Green salads. Grains Most grains. Choose whole wheat and whole grains most of the time. Rice and pasta, including brown rice and pastas made with whole wheat. Meats and other proteins Lean, well-trimmed beef, veal, pork, and lamb. Chicken and [place] without skin. All fish and shellfish. Wild duck, rabbit, pheasant, and venison. Egg whites or low-cholesterol egg substitutes. Dried beans, peas, lentils, and tofu. Seeds and most nuts. Dairy Low-fat or nonfat cheeses,  including ricotta and mozzarella. Skim or 1% milk (liquid, powdered, or evaporated). Buttermilk made with low-fat milk. Nonfat or low-fat yogurt. Fats and oils Non-hydrogenated (trans-free) margarines. Vegetable oils, including soybean, sesame, sunflower, olive, avocado, peanut, safflower, corn, canola, and cottonseed. Salad dressings or mayonnaise made with a vegetable oil. Beverages Water (mineral or sparkling). Coffee and tea. Unsweetened ice tea. Diet beverages. Sweets and desserts Sherbet, gelatin, and fruit ice. Small amounts of dark chocolate. Limit all sweets and desserts. Seasonings and condiments All seasonings and condiments. The items listed above may not be a complete list of foods and beverages you can eat. Contact a dietitian for more options. What foods should I avoid? Fruits Canned fruit in heavy syrup. Fruit in cream or butter sauce. Fried fruit. Limit coconut. Vegetables Vegetables cooked in cheese, cream, or butter sauce. Fried vegetables. Grains Breads made with saturated or trans fats, oils, or whole milk. Croissants. Sweet rolls. Donuts. High-fat crackers, such as cheese crackers and chips. Meats and other proteins Fatty meats, such as hot dogs, ribs, sausage, bacon, rib-eye roast or steak. High-fat deli meats, such as salami and bologna. Caviar. Domestic duck and goose. Organ meats, such as liver. Dairy Cream, sour cream, cream cheese, and creamed cottage cheese. Whole-milk cheeses. Whole or 2% milk (liquid, evaporated,  or condensed). Whole buttermilk. Cream sauce or high-fat cheese sauce. Whole-milk yogurt. Fats and oils Meat fat, or shortening. Cocoa butter, hydrogenated oils, palm oil, coconut oil, palm kernel oil. Solid fats and shortenings, including bacon fat, salt pork, lard, and butter. Nondairy cream substitutes. Salad dressings with cheese or sour cream. Beverages Regular sodas and any drinks with added sugar. Sweets and desserts Frosting. Pudding.  Cookies. Cakes. Pies. Milk chocolate or white chocolate. Buttered syrups. Full-fat ice cream or ice cream drinks. The items listed above may not be a complete list of foods and beverages to avoid. Contact a dietitian for more information. Summary Heart-healthy meal planning includes limiting unhealthy fats, increasing healthy fats, limiting salt (sodium) intake and making other diet and lifestyle changes. Lose weight if you are overweight. Losing just 5-10% of your body weight can help your overall health and prevent diseases such as diabetes and heart disease. Focus on eating a balance of foods, including fruits and vegetables, low-fat or nonfat dairy, lean protein, nuts and legumes, whole grains, and heart-healthy oils and fats. This information is not intended to replace advice given to you by your health care provider. Make sure you discuss any questions you have with your health care provider. Document Revised: 08/04/2021 Document Reviewed: 08/04/2021 Elsevier Patient Education  2023 Elsevier Inc.  Low-Sodium Eating Plan Sodium, which is an element that makes up salt, helps you maintain a healthy balance of fluids in your body. Too much sodium can increase your blood pressure and cause fluid and waste to be held in your body. Your health care provider or dietitian may recommend following this plan if you have high blood pressure (hypertension), kidney disease, liver disease, or heart failure. Eating less sodium can help lower your blood pressure, reduce swelling, and protect your heart, liver, and kidneys. What are tips for following this plan? Reading food labels The Nutrition Facts label lists the amount of sodium in one serving of the food. If you eat more than one serving, you must multiply the listed amount of sodium by the number of servings. Choose foods with less than 140 mg of sodium per serving. Avoid foods with 300 mg of sodium or more per serving. Shopping  Look for lower-sodium  products, often labeled as "low-sodium" or "no salt added." Always check the sodium content, even if foods are labeled as "unsalted" or "no salt added." Buy fresh foods. Avoid canned foods and pre-made or frozen meals. Avoid canned, cured, or processed meats. Buy breads that have less than 80 mg of sodium per slice. Cooking  Eat more home-cooked food and less restaurant, buffet, and fast food. Avoid adding salt when cooking. Use salt-free seasonings or herbs instead of table salt or sea salt. Check with your health care provider or pharmacist before using salt substitutes. Cook with plant-based oils, such as canola, sunflower, or olive oil. Meal planning When eating at a restaurant, ask that your food be prepared with less salt or no salt, if possible. Avoid dishes labeled as brined, pickled, cured, smoked, or made with soy sauce, miso, or teriyaki sauce. Avoid foods that contain MSG (monosodium glutamate). MSG is sometimes added to [place] food, bouillon, and some canned foods. Make meals that can be grilled, baked, poached, roasted, or steamed. These are generally made with less sodium. General information Most people on this plan should limit their sodium intake to 1,500-2,000 mg (milligrams) of sodium each day. What foods should I eat? Fruits Fresh, frozen, or canned fruit. Fruit juice. Vegetables Fresh  or frozen vegetables. "No salt added" canned vegetables. "No salt added" tomato sauce and paste. Low-sodium or reduced-sodium tomato and vegetable juice. Grains Low-sodium cereals, including oats, puffed wheat and rice, and shredded wheat. Low-sodium crackers. Unsalted rice. Unsalted pasta. Low-sodium bread. Whole-grain breads and whole-grain pasta. Meats and other proteins Fresh or frozen (no salt added) meat, poultry, seafood, and fish. Low-sodium canned tuna and salmon. Unsalted nuts. Dried peas, beans, and lentils without added salt. Unsalted canned beans. Eggs. Unsalted nut  butters. Dairy Milk. Soy milk. Cheese that is naturally low in sodium, such as ricotta cheese, fresh mozzarella, or Swiss cheese. Low-sodium or reduced-sodium cheese. Cream cheese. Yogurt. Seasonings and condiments Fresh and dried herbs and spices. Salt-free seasonings. Low-sodium mustard and ketchup. Sodium-free salad dressing. Sodium-free light mayonnaise. Fresh or refrigerated horseradish. Lemon juice. Vinegar. Other foods Homemade, reduced-sodium, or low-sodium soups. Unsalted popcorn and pretzels. Low-salt or salt-free chips. The items listed above may not be a complete list of foods and beverages you can eat. Contact a dietitian for more information. What foods should I avoid? Vegetables Sauerkraut, pickled vegetables, and relishes. Olives. [place] fries. Onion rings. Regular canned vegetables (not low-sodium or reduced-sodium). Regular canned tomato sauce and paste (not low-sodium or reduced-sodium). Regular tomato and vegetable juice (not low-sodium or reduced-sodium). Frozen vegetables in sauces. Grains Instant hot cereals. Bread stuffing, pancake, and biscuit mixes. Croutons. Seasoned rice or pasta mixes. Noodle soup cups. Boxed or frozen macaroni and cheese. Regular salted crackers. Self-rising flour. Meats and other proteins Meat or fish that is salted, canned, smoked, spiced, or pickled. Precooked or cured meat, such as sausages or meat loaves. Tomasa Blase. Ham. Pepperoni. Hot dogs. Corned beef. Chipped beef. Salt pork. Jerky. Pickled herring. Anchovies and sardines. Regular canned tuna. Salted nuts. Dairy Processed cheese and cheese spreads. Hard cheeses. Cheese curds. Blue cheese. Feta cheese. String cheese. Regular cottage cheese. Buttermilk. Canned milk. Fats and oils Salted butter. Regular margarine. Ghee. Bacon fat. Seasonings and condiments Onion salt, garlic salt, seasoned salt, table salt, and sea salt. Canned and packaged gravies. Worcestershire sauce. Tartar sauce. Barbecue  sauce. Teriyaki sauce. Soy sauce, including reduced-sodium. Steak sauce. Fish sauce. Oyster sauce. Cocktail sauce. Horseradish that you find on the shelf. Regular ketchup and mustard. Meat flavorings and tenderizers. Bouillon cubes. Hot sauce. Pre-made or packaged marinades. Pre-made or packaged taco seasonings. Relishes. Regular salad dressings. Salsa. Other foods Salted popcorn and pretzels. Corn chips and puffs. Potato and tortilla chips. Canned or dried soups. Pizza. Frozen entrees and pot pies. The items listed above may not be a complete list of foods and beverages you should avoid. Contact a dietitian for more information. Summary Eating less sodium can help lower your blood pressure, reduce swelling, and protect your heart, liver, and kidneys. Most people on this plan should limit their sodium intake to 1,500-2,000 mg (milligrams) of sodium each day. Canned, boxed, and frozen foods are high in sodium. Restaurant foods, fast foods, and pizza are also very high in sodium. You also get sodium by adding salt to food. Try to cook at home, eat more fresh fruits and vegetables, and eat less fast food and canned, processed, or prepared foods. This information is not intended to replace advice given to you by your health care provider. Make sure you discuss any questions you have with your health care provider. Document Revised: 06/05/2019 Document Reviewed: 05/31/2019 Elsevier Patient Education  2023 [employer].

## 2022-11-03 ENCOUNTER — Other Ambulatory Visit: Payer: Self-pay | Admitting: Surgery

## 2022-11-03 DIAGNOSIS — Z951 Presence of aortocoronary bypass graft: Secondary | ICD-10-CM

## 2022-11-04 ENCOUNTER — Encounter: Payer: Self-pay | Admitting: Surgery

## 2022-11-04 ENCOUNTER — Ambulatory Visit (INDEPENDENT_AMBULATORY_CARE_PROVIDER_SITE_OTHER): Payer: Self-pay | Admitting: Surgery

## 2022-11-04 VITALS — BP 109/75 | HR 104 | Resp 20 | Ht 62.0 in | Wt 174.0 lb

## 2022-11-04 DIAGNOSIS — I2510 Atherosclerotic heart disease of native coronary artery without angina pectoris: Secondary | ICD-10-CM

## 2022-11-04 DIAGNOSIS — Z951 Presence of aortocoronary bypass graft: Secondary | ICD-10-CM

## 2022-11-04 MED ORDER — LACTULOSE 10 GM/15ML PO SOLN: 20.0000 g | Freq: Every day | ORAL | Status: AC | PRN

## 2022-11-04 NOTE — Progress Notes (Signed)
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 The patient's early postoperative recovery while in the hospital was notable for postoperative vasoplegia requiring institution of midodrine therapy.  She could not be weaned off of oxygen and was sent home on supplemental oxygen.  She also had some problems with constipation and was sent home with 2 doses of lactulose. Since hospital discharge the patient reports that she has continued to have constipation and has not had a normal bowel movement.  She has had her gallbladder removed previously.  She was seen by cardiology 2 days ago and had a KUB done which was unremarkable.  She has also had burning and tingling along the medial aspect of her right leg from the vein harvest site down.  She has been taking Neurontin which she had been on previously for back pain.  She has escalated her dose at home to 800 mg 3 times per day but does not feel that that has improved her leg symptoms.  It has been causing significant alteration of her sensorium and she would like to get off of it.  She denies any chest pain or shortness of breath.   Current Outpatient Medications  Medication Sig Dispense Refill   albuterol (VENTOLIN HFA) 108 (90 Base) MCG/ACT inhaler Inhale 2 puffs into the lungs every 4 (four) hours as needed for wheezing or shortness of breath. 6.7 g 1   amphetamine-dextroamphetamine (ADDERALL XR) 30 MG 24 hr capsule Take 60 mg by mouth daily.     aspirin EC 325 MG tablet Take 1 tablet (325 mg total) by mouth daily. 100 tablet 1   atorvastatin (LIPITOR) 80 MG tablet Take 1 tablet (80 mg total) by mouth daily. 90 tablet 3   escitalopram (LEXAPRO) 10 MG tablet Take 10 mg by mouth daily as needed (anxiety).     Fe Fum-Vit C-Vit B12-FA (TRIGELS-F FORTE) CAPS capsule Take 1 capsule by mouth daily after  breakfast for 1 month. 30 capsule 0   furosemide (LASIX) 20 MG tablet Take 1 tablet (20 mg total) by mouth daily as needed (for lower extremity edema). 45 tablet 3   gabapentin (NEURONTIN) 100 MG capsule Take 100-200 mg by mouth 4 (four) times daily as needed (pain).     gabapentin (NEURONTIN) 300 MG capsule Take 600 mg by mouth daily as needed (pain).     guaiFENesin (MUCINEX) 600 MG 12 hr tablet Take 1 tablet (600 mg total) by mouth 2 (two) times daily for 1 week. 14 tablet 0   lactulose (CHRONULAC) 10 GM/15ML solution Take 30 mLs (20 g total) by mouth daily as needed for mild constipation. 237 mL 1   methocarbamol (ROBAXIN) 500 MG tablet Take 500 mg by mouth daily as needed for muscle spasms.     metoprolol succinate (TOPROL-XL) 25 MG 24 hr tablet Take 1 tablet (25 mg total) by mouth in the morning and at bedtime. 180 tablet 3   midodrine (PROAMATINE) 5 MG tablet Take 1 tablet (5 mg total) by mouth 3 (three) times daily with meals. 60 tablet 1   nicotine (NICODERM CQ - DOSED IN MG/24 HOURS) 14 mg/24hr patch Place 1 patch (14 mg total) onto the skin daily. For 3 weeks then reduce the dose to  patch topically daily (available over the counter) 21 patch 0   nitroGLYCERIN (NITROSTAT) 0.4 MG SL tablet Place 1 tablet (0.4  mg total) under the tongue every 5 (five) minutes as needed for chest pain. 25 tablet 3   omeprazole (PRILOSEC) 20 MG capsule Take 20 mg by mouth daily.     zolpidem (AMBIEN) 10 MG tablet Take 10 mg by mouth at bedtime.     Current Facility-Administered Medications  Medication Dose Route Frequency Provider Last Rate Last Admin   lactulose (CHRONULAC) 10 GM/15ML solution 20 g  20 g Oral Daily PRN Alleen Borne, MD        Physical Exam: BP 109/75   Pulse (!) 104   Resp 20   Ht  (1.575 m)   Wt 174 lb (78.9 kg)   SpO2 96% Comment: RA  BMI 31.83 kg/m  She looks well. Cardiac exam shows regular rate and rhythm with normal heart sounds.  There is no murmur. Lungs are  clear. The chest incision is healing well and the sternum is stable. The right leg incision is healing well and there is no lower extremity edema.  Diagnostic Tests:  Narrative & Impression  CLINICAL DATA:  Status post CABG.  Dizzy with low blood pressure.   EXAM: CHEST - 2 VIEW   COMPARISON:  10/12/2022.   FINDINGS: Cardiopericardial silhouette is at upper limits of normal for size. Low lung volumes. The lungs are clear without focal pneumonia, edema, pneumothorax or pleural effusion. Status post median sternotomy. Anterior cervical fusion hardware evident.   IMPRESSION: Low volume film without acute cardiopulmonary findings.     Electronically Signed   By: Kennith Center M.D.   On: 11/02/2022 12:18         Impression:  Overall I think she has made good progress for 3 weeks following her surgery.  I encouraged her to continue walking is much as possible and I think she could begin cardiac rehab.  I told her she could drive a car but should refrain from lifting anything heavier than 10 pounds for 3 months postoperatively.  She still has significant discomfort along her right lower leg which sounds like neuropathy but the Neurontin does not appear to be making a difference and I advised her to gradually decrease the dose of that.  I do not think there is any reason to resume narcotic pain medicine at this time especially with her constipation.  I did send an order to her pharmacy for lactulose 20 g daily as needed for constipation for 4 doses as needed since she said that is the only thing that helps her constipation.  Plan:  I will plan to see her back in the office in 1 month for follow-up.   Alleen Borne, MD [facility] [phone]

## 2022-11-10 ENCOUNTER — Telehealth (HOSPITAL_COMMUNITY): Payer: Self-pay

## 2022-11-10 ENCOUNTER — Encounter (HOSPITAL_COMMUNITY): Payer: Self-pay

## 2022-11-10 NOTE — Telephone Encounter (Signed)
Attempted to call patient in regards to Cardiac Rehab - LM on VM Mailed letter 

## 2022-11-11 ENCOUNTER — Telehealth: Payer: Self-pay | Admitting: Physician Assistant

## 2022-11-11 NOTE — Telephone Encounter (Signed)
Pt called to inform office that she reached out to the surgeon and they stated they were only responsible for the incision and not pain medication. Pt stated she needs some kind of pain relief medication for the needles feeling she's been experiencing over the last few weeks. Pt would like a call back to see what her options may be. Please advise

## 2022-11-11 NOTE — Telephone Encounter (Signed)
  [name], [place]  to Me    11/11/22  1:14 PM If the "needles" feeling is from the surgery they can absolutely prescribe her gabapentin or something for nerve pain. If they won't I would have her call her PCP. Its inappropriate for a cardiologist to treat nerve pain.  Thanks! Janet Dory, PA-C  ___________________________________________   Called patient back.  She discussed with surgery last week and states she was told "to suffer through it". She understands she will have to go through the healing process but it is "interfering w my mood and making me edgy". Has gabapentin from a previous prescription and has been taking 400 mg tablets every 2-3 hours.  She has also Lyrica from previous and that is not helped. Heat helps.  This is a numb feeling on right leg from knee to toes - the bottom of her foot hurts and toes throb at times. Can't let air hit it.  Gets itchy but hurts to scratch.  Also she has had chills a lot the past week.  Has not checked temp.  She was freezing sitting out in 83 degree heat on Saturday.  Does not feel like she's been febrile.   I adv the patient to contact her PCP since she has discussed with surgeon last week already.  She would like to know if [name] has any other suggestions.

## 2022-11-11 NOTE — Telephone Encounter (Signed)
Sent reply from [place] to patient in MyChart.

## 2022-11-11 NOTE — Telephone Encounter (Signed)
Will route message to PA-C who has been seeing patient the last several times she has been in the office.   Last seen by Dr. Clifton James 10/2019.

## 2022-11-16 ENCOUNTER — Encounter (HOSPITAL_COMMUNITY): Payer: Self-pay | Admitting: Surgery

## 2022-11-16 ENCOUNTER — Ambulatory Visit: Payer: BC Managed Care – PPO | Attending: Cardiovascular Disease | Admitting: Cardiovascular Disease

## 2022-11-16 VITALS — BP 118/82 | HR 100 | Ht 62.5 in | Wt 176.2 lb

## 2022-11-16 DIAGNOSIS — I255 Ischemic cardiomyopathy: Secondary | ICD-10-CM

## 2022-11-16 DIAGNOSIS — E7800 Pure hypercholesterolemia, unspecified: Secondary | ICD-10-CM | POA: Diagnosis not present

## 2022-11-16 DIAGNOSIS — I2510 Atherosclerotic heart disease of native coronary artery without angina pectoris: Secondary | ICD-10-CM

## 2022-11-16 NOTE — Progress Notes (Signed)
Chief Complaint  Patient presents with   Follow-up    CAD   History of Present Illness: 52 yo female with history of CAD s/p 3V CABG, atrial fibrillation, ischemic cardiomyopathy, anxiety, fibromyalgia, GERD, arthritis, HLD, rheumatoid arthritis, tobacco abuse here today for cardiac follow up. She was seen as a new patient for the evaluation of her cardiac risk factors in March 2021. She has had palpitations at home at home. She feels like her heart skips a beat. She has had dyspnea with exertion. No chest pain. She is very anxious. She smokes 1 ppd. Normal exercise stress test march 2021. Echo March 2021 with LVEF=50-55%. No valve disease. Cardiac monitor April 2021 with sinus rhythm, 2 short runs of VT (longest 9 beats), 3 short runs of SVt (longest 12 beats).  She was told that she had atrial fibrillation back in 2023. 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 Echo January 2024 with LVEF=35-40%. Mild to moderate mitral valve regurgitation. Coronary CTA January 2024 with moderate 3 vessel CAD. Cardiac cath 09/07/22 with severe LAD and Diagonal disease. She underwent 4V CABG on 10/08/22 with overall uneventful postoperative course. Also with pulmonary vein isolation ablation and ligation of the left atrial appendage at the time of her surgery.   She is here today for follow up. The patient denies any chest pain, dyspnea, palpitations, lower extremity edema, orthopnea, PND, dizziness, near syncope or syncope. Still having some leg pain from vein harvest site.   Primary Care Physician: Johny Blamer, MD  Past Medical History:  Diagnosis Date   A-fib [facility])    Anxiety    Arthralgia    Asthma    Attention-deficit hyperactivity disorder, predominantly inattentive type    Back pain    Dysrhythmia    Afib   Fear of flying    Fibromyalgia    Gastroesophageal reflux disease    HPV in female    Joint pain    Localized edema    Neck pain    OA (osteoarthritis)    Papillomavirus as the cause of diseases  classified elsewhere    Primary insomnia    Pure hypercholesterolemia    Rheumatoid arthritis, seronegative, hand, unspecified laterality (HCC)    patient feels it is back related, not a true arthritis   Spondylolisthesis of lumbosacral region    Tobacco dependence    Unspecified inflammatory spondylopathy, cervical region (HCC)    Vitamin B12 deficiency anemia    Voice hoarseness     Past Surgical History:  Procedure Laterality Date   ABDOMINAL EXPOSURE N/A 06/28/2020   Procedure: ABDOMINAL EXPOSURE;  Surgeon: Cephus Shelling, MD;  Location: [facility] OR;  Service: Vascular;  Laterality: N/A;  anterior   ANTERIOR LUMBAR FUSION N/A 06/28/2020   Procedure: ANTERIOR LUMBAR INTERBODY FUSION LUMBAR FIVE-SACRAL ONE.;  Surgeon: Tia Alert, MD;  Location: [facility] OR;  Service: Neurosurgery;  Laterality: N/A;  anterior approach   BREAST ENHANCEMENT SURGERY     CARPAL TUNNEL RELEASE Left    CARPAL TUNNEL RELEASE Right 06/28/2020   Procedure: CARPAL TUNNEL RELEASE;  Surgeon: Tia Alert, MD;  Location: [facility] OR;  Service: Neurosurgery;  Laterality: Right;   CERVICAL SPINE SURGERY     CHOLECYSTECTOMY     CLIPPING OF ATRIAL APPENDAGE N/A 10/08/2022   Procedure: CLIPPING OF ATRIAL APPENDAGE USING ATRICURE ATRICLIP PRO2 SIZE 35;  Surgeon: Alleen Borne, MD;  Location: [facility] OR;  Service: Open Heart Surgery;  Laterality: N/A;   CORONARY ARTERY BYPASS GRAFT N/A 10/08/2022  Procedure: CORONARY ARTERY BYPASS GRAFTING (CABG) X3 USING LEFT INTERNAL MAMMARY ARTERY AND RIGHT GREATER SAPHENOUS VEIN HARVESTED ENDOSCOPICALLY;  Surgeon: Alleen Borne, MD;  Location: [facility] OR;  Service: Open Heart Surgery;  Laterality: N/A;   CORONARY PRESSURE/FFR STUDY N/A 09/07/2022   Procedure: INTRAVASCULAR PRESSURE WIRE/FFR STUDY;  Surgeon: Yvonne Kendall, MD;  Location: [facility] INVASIVE CV LAB;  Service: Cardiovascular;  Laterality: N/A;   FOOT SURGERY     MANDIBLE SURGERY     MAZE N/A 10/08/2022   Procedure: PULMONARY VEIN  ABLATION USING ATRICURE ISOLATOR SYNERGY ENCOMPASS OLH;  Surgeon: Alleen Borne, MD;  Location: [facility] OR;  Service: Open Heart Surgery;  Laterality: N/A;   RIGHT/LEFT HEART CATH AND CORONARY ANGIOGRAPHY N/A 09/07/2022   Procedure: RIGHT/LEFT HEART CATH AND CORONARY ANGIOGRAPHY;  Surgeon: Yvonne Kendall, MD;  Location: [facility] INVASIVE CV LAB;  Service: Cardiovascular;  Laterality: N/A;   TEE WITHOUT CARDIOVERSION N/A 10/08/2022   Procedure: TRANSESOPHAGEAL ECHOCARDIOGRAM;  Surgeon: Alleen Borne, MD;  Location: [facility] OR;  Service: Open Heart Surgery;  Laterality: N/A;   TONSILLECTOMY     TUBAL LIGATION     with ablation   TYMPANOSTOMY TUBE PLACEMENT      Current Outpatient Medications  Medication Sig Dispense Refill   albuterol (VENTOLIN HFA) 108 (90 Base) MCG/ACT inhaler Inhale 2 puffs into the lungs every 4 (four) hours as needed for wheezing or shortness of breath. 6.7 g 1   amphetamine-dextroamphetamine (ADDERALL XR) 30 MG 24 hr capsule Take 60 mg by mouth daily.     aspirin EC 325 MG tablet Take 1 tablet (325 mg total) by mouth daily. 100 tablet 1   atorvastatin (LIPITOR) 80 MG tablet Take 1 tablet (80 mg total) by mouth daily. 90 tablet 3   escitalopram (LEXAPRO) 10 MG tablet Take 10 mg by mouth daily as needed (anxiety).     Fe Fum-Vit C-Vit B12-FA (TRIGELS-F FORTE) CAPS capsule Take 1 capsule by mouth daily after breakfast for 1 month. 30 capsule 0   furosemide (LASIX) 20 MG tablet Take 1 tablet (20 mg total) by mouth daily as needed (for lower extremity edema). 45 tablet 3   gabapentin (NEURONTIN) 100 MG capsule Take 100-200 mg by mouth 4 (four) times daily as needed (pain).     gabapentin (NEURONTIN) 300 MG capsule Take 600 mg by mouth daily as needed (pain).     guaiFENesin (MUCINEX) 600 MG 12 hr tablet Take 1 tablet (600 mg total) by mouth 2 (two) times daily for 1 week. 14 tablet 0   lactulose (CHRONULAC) 10 GM/15ML solution Take 30 mLs (20 g total) by mouth daily as needed for mild  constipation. 237 mL 1   methocarbamol (ROBAXIN) 500 MG tablet Take 500 mg by mouth daily as needed for muscle spasms.     metoprolol succinate (TOPROL-XL) 25 MG 24 hr tablet Take 1 tablet (25 mg total) by mouth in the morning and at bedtime. 180 tablet 3   midodrine (PROAMATINE) 5 MG tablet Take 1 tablet (5 mg total) by mouth 3 (three) times daily with meals. 60 tablet 1   nicotine (NICODERM CQ - DOSED IN MG/24 HOURS) 14 mg/24hr patch Place 1 patch (14 mg total) onto the skin daily. For 3 weeks then reduce the dose to 7mg  patch topically daily (available over the counter) 21 patch 0   omeprazole (PRILOSEC) 20 MG capsule Take 20 mg by mouth daily.     zolpidem (AMBIEN) 10 MG tablet Take 10 mg by mouth  at bedtime.     nitroGLYCERIN (NITROSTAT) 0.4 MG SL tablet Place 1 tablet (0.4 mg total) under the tongue every 5 (five) minutes as needed for chest pain. 25 tablet 3   Current Facility-Administered Medications  Medication Dose Route Frequency Provider Last Rate Last Admin   lactulose (CHRONULAC) 10 GM/15ML solution 20 g  20 g Oral Daily PRN Alleen Borne, MD        Allergies  Allergen Reactions   Zoloft [Sertraline] Other (See Comments)    Altered mental state    Social History   Socioeconomic History   Marital status: Married    Spouse name: Not on file   Number of children: 2   Years of education: Not on file   Highest education level: Not on file  Occupational History   Occupation: Works at home  Tobacco Use   Smoking status: Every Day    Packs/day: 1    Types: Cigarettes   Smokeless tobacco: Never  Vaping Use   Vaping Use: Never used  Substance and Sexual Activity   Alcohol use: Yes    Comment: occasional   Drug use: Never   Sexual activity: Not on file  Other Topics Concern   Not on file  Social History Narrative   Not on file   Social Determinants of Health   Financial Resource Strain: Not on file  Food Insecurity: No Food Insecurity (10/08/2022)   Hunger Vital  Sign    Worried About Running Out of Food in the Last Year: Never true    Ran Out of Food in the Last Year: Never true  Transportation Needs: No Transportation Needs (10/08/2022)   PRAPARE - Administrator, Civil Service (Medical): No    Lack of Transportation (Non-Medical): No  Physical Activity: Not on file  Stress: Not on file  Social Connections: Not on file  Intimate Partner Violence: Not At Risk (10/08/2022)   Humiliation, Afraid, Rape, and Kick questionnaire    Fear of Current or Ex-Partner: No    Emotionally Abused: No    Physically Abused: No    Sexually Abused: No    Family History  Problem Relation Age of Onset   Hypertension Father    Heart disease Father     Review of Systems:  As stated in the HPI and otherwise negative.   BP 118/82   Pulse 100   Ht 5' 2.5" (1.588 m)   Wt 79.9 kg   SpO2 97%   BMI 31.72 kg/m   Physical Examination: General: Well developed, well nourished, NAD  HEENT: OP clear, mucus membranes moist  SKIN: warm, dry. No rashes. Neuro: No focal deficits  Musculoskeletal: Muscle strength 5/5 all ext  Psychiatric: Mood and affect normal  Neck: No JVD, no carotid bruits, no thyromegaly, no lymphadenopathy.  Lungs:Clear bilaterally, no wheezes, rhonci, crackles Cardiovascular: Regular rate and rhythm. No murmurs, gallops or rubs. Abdomen:Soft. Bowel sounds present. Non-tender.  Extremities: No lower extremity edema. Pulses are 2 + in the bilateral DP/PT.  EKG:  EKG is not ordered today. The ekg ordered today demonstrates   Echo 07/20/22:  1. Left ventricular ejection fraction, by estimation, is 35 to 40%. The  left ventricle has moderately decreased function. The left ventricle  demonstrates regional wall motion abnormalities (see scoring  diagram/findings for description). The left  ventricular internal cavity size was mildly dilated. Left ventricular  diastolic parameters are indeterminate. Mild global hypokinesis with   moderate hypokinesis of septal wall.  2. Right ventricular systolic function is normal. The right ventricular  size is normal.   3. Left atrial size was moderately dilated.   4. The mitral valve is normal in structure. Mild to moderate mitral valve  regurgitation. No evidence of mitral stenosis.   5. The aortic valve is grossly normal. There is mild calcification of the  aortic valve. Aortic valve regurgitation is not visualized. Aortic valve  sclerosis is present, with no evidence of aortic valve stenosis.   6. The inferior vena cava is normal in size with greater than 50%  respiratory variability, suggesting right atrial pressure of 3 mmHg.   7. Cannot exclude a small PFO.   Comparison(s): Prior images reviewed side by side. Changes from prior  study are noted. Prior images with normal EF, maybe minimal abnormal  septal motion. EF reduced and septal motion worse compared to prior.   Recent Labs: 06/18/2022: TSH 0.652 10/06/2022: ALT 15 10/09/2022: Magnesium 2.2 10/12/2022: BUN 9; Creatinine, Ser 0.63; Hemoglobin 8.1; Platelets 191; Potassium 4.5; Sodium 132   Lipid Panel    Component Value Date/Time   CHOL 230 (H) 09/01/2022 0932   TRIG 187 (H) 09/01/2022 0932   HDL 60 09/01/2022 0932   CHOLHDL 3.8 09/01/2022 0932   LDLCALC 137 (H) 09/01/2022 0932     Wt Readings from Last 3 Encounters:  11/16/22 79.9 kg  11/04/22 78.9 kg  11/02/22 78.5 kg    Assessment and Plan:   1. CAD s/p CABG without angina: She is doing well now 6 weeks post CABG. Will continue ASA, statin. BP has been in the low 100s at home. She has been taking midodrine some days and Toprol some days. Will stop Toprol and midodrine and follow BP.   2. Hyperlipidemia: LDL 137 in February 2024. She is on Lipitor 80 mg daily. Repeat lipids and LFTs now.   3. Palpitations/SVT/PVCs/Possible atrial fibrillation: Cardiac monitor February 2024 with no evidence of atrial fib. Rare palpitations. Continue beta blocker. Will  not start Eliquis at this time unless she has recurrence of atrial fib.   4. Ischemic cardiomyopathy: LVEF=35-40% prior to CABG. Will repeat echo now. If her LVEF is below 50%, will need to resume low dose Toprol. Her soft BP will limit addition/titration of GDMT.   Labs/ tests ordered today include:   Orders Placed This Encounter  Procedures   Lipid panel   Hepatic function panel   ECHOCARDIOGRAM COMPLETE   Disposition:   F/U with me in 3 months.    Signed, Verne Carrow, MD 11/16/2022 4:01 PM    [facility] [address] [place], [place], [place]  [address] Phone: [phone]; Fax: [phone]

## 2022-11-16 NOTE — Patient Instructions (Addendum)
Medication Instructions:  Hold Toprol XL and midodrine for now and follow your blood pressures.  Keep a list.  *If you need a refill on your cardiac medications before your next appointment, please call your pharmacy*   Lab Work: Today: lipids/liver function If you have labs (blood work) drawn today and your tests are completely normal, you will receive your results only by: MyChart Message (if you have MyChart) OR A paper copy in the mail If you have any lab test that is abnormal or we need to change your treatment, we will call you to review the results.   Testing/Procedures: Your physician has requested that you have an echocardiogram. Echocardiography is a painless test that uses sound waves to create images of your heart. It provides your doctor with information about the size and shape of your heart and how well your heart's chambers and valves are working. This procedure takes approximately one hour. There are no restrictions for this procedure. Please do NOT wear cologne, perfume, aftershave, or lotions (deodorant is allowed). Please arrive 15 minutes prior to your appointment time.   Follow-Up: 3 months with Dr. Clifton James

## 2022-11-17 LAB — HEPATIC FUNCTION PANEL
ALT: 18 IU/L (ref 0–32)
AST: 17 IU/L (ref 0–40)
Albumin: 4.4 g/dL (ref 3.8–4.9)
Alkaline Phosphatase: 157 IU/L — ABNORMAL HIGH (ref 44–121)
Bilirubin Total: 0.3 mg/dL (ref 0.0–1.2)
Bilirubin, Direct: <0.1 mg/dL (ref 0.00–0.40)
Total Protein: 6.8 g/dL (ref 6.0–8.5)

## 2022-11-17 LAB — LIPID PANEL
Chol/HDL Ratio: 3.6 ratio (ref 0.0–4.4)
Cholesterol, Total: 146 mg/dL (ref 100–199)
HDL: 41 mg/dL (ref >39)
LDL Chol Calc (NIH): 65 mg/dL (ref 0–99)
Triglycerides: 247 mg/dL — ABNORMAL HIGH (ref 0–149)
VLDL Cholesterol Cal: 40 mg/dL (ref 5–40)

## 2022-12-02 ENCOUNTER — Telehealth: Payer: Self-pay | Admitting: Cardiovascular Disease

## 2022-12-02 NOTE — Telephone Encounter (Signed)
Left message for patient to call back  

## 2022-12-02 NOTE — Telephone Encounter (Signed)
Pt c/o BP issue: STAT if pt c/o blurred vision, one-sided weakness or slurred speech  1. What are your last 5 BP readings? 162/102; 108; 151/101; 101; 143/100; 98; 165/103; 101; 162/101; 100  2. Are you having any other symptoms (ex. Dizziness, headache, blurred vision, passed out)? Numbness in face, blurred vision  3. What is your BP issue? Really high BP and HR

## 2022-12-03 NOTE — Telephone Encounter (Signed)
Pt was returning nurse call and would like a callback regarding her BP. Please advise

## 2022-12-03 NOTE — Telephone Encounter (Signed)
Patient stated she was only taking metoprolol 25 mg as needed. She will start to take metoprolol 25 mg BID

## 2022-12-03 NOTE — Telephone Encounter (Signed)
Patient states her blood pressure has been running high the last couple of days 162/102 hr-108 151/101 hr- 101 143/100 -98 165/103 hr-101 162/101 hr-100   States she has not had blood pressure issues in the past. Denies any chest pain, shortness of breath, headache, dizziness, edema, nausea or vomiting.  Currently bp is 140/72 hr-86. Advised to keep monitoring blood pressure and will refer to provider for more recommendations.

## 2022-12-09 ENCOUNTER — Ambulatory Visit: Payer: Self-pay | Admitting: Surgery

## 2022-12-09 ENCOUNTER — Encounter: Payer: Self-pay | Admitting: Surgery

## 2022-12-18 ENCOUNTER — Telehealth: Payer: Self-pay | Admitting: Cardiovascular Disease

## 2022-12-18 ENCOUNTER — Ambulatory Visit (HOSPITAL_COMMUNITY): Payer: BC Managed Care – PPO | Attending: Internal Medicine

## 2022-12-18 DIAGNOSIS — I255 Ischemic cardiomyopathy: Secondary | ICD-10-CM | POA: Diagnosis present

## 2022-12-18 LAB — ECHOCARDIOGRAM COMPLETE
Area-P 1/2: 5.13 cm2
S' Lateral: 3.5 cm

## 2022-12-18 NOTE — Telephone Encounter (Signed)
Called the patient. She requests a note for her to remain out of work until she feels she is ready.  She said the surgeon told her it could be 30-60-90 days, all depends on how fast she heals.  She is not sure when her next appointment with them is.  She said "he told me all he is taking care of is the incision".  Adv to go to or schedule her next follow up with him as their office will be who determines her activity levels including returning to work.  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

## 2022-12-18 NOTE — Telephone Encounter (Signed)
Pt is requesting a work note about recovery from her procedure on 10/08/2022. Pt does have an echo at cs location this morning. Please feel free to see her or call for further advisement.

## 2022-12-23 ENCOUNTER — Other Ambulatory Visit: Payer: Self-pay | Admitting: Neurological Surgery

## 2022-12-23 DIAGNOSIS — M542 Cervicalgia: Secondary | ICD-10-CM

## 2022-12-23 DIAGNOSIS — M5416 Radiculopathy, lumbar region: Secondary | ICD-10-CM

## 2023-01-04 ENCOUNTER — Ambulatory Visit (INDEPENDENT_AMBULATORY_CARE_PROVIDER_SITE_OTHER): Payer: Self-pay | Admitting: Surgery

## 2023-01-04 ENCOUNTER — Encounter: Payer: Self-pay | Admitting: Surgery

## 2023-01-04 VITALS — BP 116/81 | HR 108 | Resp 20 | Ht 62.5 in | Wt 177.0 lb

## 2023-01-04 DIAGNOSIS — Z951 Presence of aortocoronary bypass graft: Secondary | ICD-10-CM

## 2023-01-04 MED ORDER — NICOTINE 7 MG/24HR TD PT24: 14.0000 mg | MEDICATED_PATCH | Freq: Every day | TRANSDERMAL | Status: DC

## 2023-01-04 NOTE — Progress Notes (Signed)
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  She is now about 3 months out from her surgery and feeling much better.  She feels like she is ready return to work.  She is walking without chest pain or shortness of breath.    Current Outpatient Medications  Medication Sig Dispense Refill   albuterol (VENTOLIN HFA) 108 (90 Base) MCG/ACT inhaler Inhale 2 puffs into the lungs every 4 (four) hours as needed for wheezing or shortness of breath. 6.7 g 1   amphetamine-dextroamphetamine (ADDERALL XR) 30 MG 24 hr capsule Take 60 mg by mouth daily.     aspirin EC 325 MG tablet Take 1 tablet (325 mg total) by mouth daily. 100 tablet 1   atorvastatin (LIPITOR) 80 MG tablet Take 1 tablet (80 mg total) by mouth daily. 90 tablet 3   escitalopram (LEXAPRO) 10 MG tablet Take 10 mg by mouth daily as needed (anxiety).     Fe Fum-Vit C-Vit B12-FA (TRIGELS-F FORTE) CAPS capsule Take 1 capsule by mouth daily after breakfast for 1 month. 30 capsule 0   furosemide (LASIX) 20 MG tablet Take 1 tablet (20 mg total) by mouth daily as needed (for lower extremity edema). 45 tablet 3   gabapentin (NEURONTIN) 100 MG capsule Take 100-200 mg by mouth 4 (four) times daily as needed (pain).     gabapentin (NEURONTIN) 300 MG capsule Take 600 mg by mouth daily as needed (pain).     guaiFENesin (MUCINEX) 600 MG 12 hr tablet Take 1 tablet (600 mg total) by mouth 2 (two) times daily for 1 week. 14 tablet 0   lactulose (CHRONULAC) 10 GM/15ML solution Take 30 mLs (20 g total) by mouth daily as needed for mild constipation. 237 mL 1   methocarbamol (ROBAXIN) 500 MG tablet Take 500 mg by mouth daily as needed for muscle spasms.     metoprolol succinate (TOPROL-XL) 25 MG 24 hr tablet Take 1 tablet (25 mg total) by mouth in the morning and at bedtime. 180 tablet 3   nicotine  (NICODERM CQ - DOSED IN MG/24 HOURS) 14 mg/24hr patch Place 1 patch (14 mg total) onto the skin daily. For 3 weeks then reduce the dose to 7mg  patch topically daily (available over the counter) 21 patch 0   omeprazole (PRILOSEC) 20 MG capsule Take 20 mg by mouth daily.     zolpidem (AMBIEN) 10 MG tablet Take 10 mg by mouth at bedtime.     nitroGLYCERIN (NITROSTAT) 0.4 MG SL tablet Place 1 tablet (0.4 mg total) under the tongue every 5 (five) minutes as needed for chest pain. 25 tablet 3   Current Facility-Administered Medications  Medication Dose Route Frequency Provider Last Rate Last Admin   lactulose (CHRONULAC) 10 GM/15ML solution 20 g  20 g Oral Daily PRN Alleen Borne, MD       nicotine (NICODERM CQ - dosed in mg/24 hr) patch 14 mg  14 mg Transdermal Daily Alleen Borne, MD        Physical Exam: BP 116/81   Pulse (!) 108   Resp 20   Ht 5' 2.5" (1.588 m)   Wt 177 lb (80.3 kg)   SpO2 96% Comment: RA  BMI 31.86 kg/m  She looks well. Cardiac exam shows a regular rate and rhythm with normal heart sounds.  There is no murmur. Lungs are clear.  The chest incision is well-healed and the sternum is stable. There is no peripheral edema.  Diagnostic Tests:  None today  Impression:  Overall I think she looks very good 3 months following her surgery.  I told her that she can return to work.  There are no restrictions at this time.  I did renew her nicotine patch prescription for 14 mg patches daily for another 30 days and then told her that she should decrease that to 7 mg daily.  Plan:  She will continue to follow-up with cardiology and her PCP.  She will return to see me if she has any problems with her incision.   Alleen Borne, MD [facility] [phone]

## 2023-01-06 ENCOUNTER — Ambulatory Visit: Payer: Self-pay | Admitting: Surgery

## 2023-01-06 ENCOUNTER — Other Ambulatory Visit: Payer: Self-pay | Admitting: Surgery

## 2023-01-06 ENCOUNTER — Other Ambulatory Visit: Payer: Self-pay | Admitting: *Deleted

## 2023-01-06 NOTE — Telephone Encounter (Signed)
Per Dr. Sharee Pimple office note 6/24, refill of nicotine patch 14mg  sent to patient's preferred pharmacy.

## 2023-01-19 ENCOUNTER — Ambulatory Visit
Admission: RE | Admit: 2023-01-19 | Discharge: 2023-01-19 | Disposition: A | Discharge status: Home / Self Care | Payer: BC Managed Care – PPO | Source: Ambulatory Visit | Attending: Neurological Surgery | Admitting: Neurological Surgery

## 2023-01-19 DIAGNOSIS — M542 Cervicalgia: Secondary | ICD-10-CM

## 2023-01-19 DIAGNOSIS — M5416 Radiculopathy, lumbar region: Secondary | ICD-10-CM

## 2023-01-26 ENCOUNTER — Emergency Department (HOSPITAL_BASED_OUTPATIENT_CLINIC_OR_DEPARTMENT_OTHER): Payer: BC Managed Care – PPO

## 2023-01-26 ENCOUNTER — Encounter (HOSPITAL_BASED_OUTPATIENT_CLINIC_OR_DEPARTMENT_OTHER): Payer: Self-pay | Admitting: Radiology

## 2023-01-26 ENCOUNTER — Observation Stay (HOSPITAL_BASED_OUTPATIENT_CLINIC_OR_DEPARTMENT_OTHER)
Admission: EM | Admit: 2023-01-26 | Discharge: 2023-01-28 | Disposition: A | Discharge status: Home / Self Care | Payer: BC Managed Care – PPO | Attending: Family Medicine | Admitting: Family Medicine

## 2023-01-26 ENCOUNTER — Other Ambulatory Visit: Payer: Self-pay

## 2023-01-26 DIAGNOSIS — K2100 Gastro-esophageal reflux disease with esophagitis, without bleeding: Secondary | ICD-10-CM | POA: Insufficient documentation

## 2023-01-26 DIAGNOSIS — R079 Chest pain, unspecified: Principal | ICD-10-CM

## 2023-01-26 DIAGNOSIS — R0781 Pleurodynia: Secondary | ICD-10-CM

## 2023-01-26 DIAGNOSIS — Z79899 Other long term (current) drug therapy: Secondary | ICD-10-CM | POA: Diagnosis not present

## 2023-01-26 DIAGNOSIS — Z951 Presence of aortocoronary bypass graft: Secondary | ICD-10-CM | POA: Insufficient documentation

## 2023-01-26 DIAGNOSIS — F17210 Nicotine dependence, cigarettes, uncomplicated: Secondary | ICD-10-CM | POA: Diagnosis not present

## 2023-01-26 DIAGNOSIS — J45909 Unspecified asthma, uncomplicated: Secondary | ICD-10-CM | POA: Diagnosis not present

## 2023-01-26 DIAGNOSIS — I2510 Atherosclerotic heart disease of native coronary artery without angina pectoris: Secondary | ICD-10-CM | POA: Insufficient documentation

## 2023-01-26 DIAGNOSIS — Z7982 Long term (current) use of aspirin: Secondary | ICD-10-CM | POA: Insufficient documentation

## 2023-01-26 DIAGNOSIS — I4891 Unspecified atrial fibrillation: Secondary | ICD-10-CM | POA: Diagnosis not present

## 2023-01-26 DIAGNOSIS — K3189 Other diseases of stomach and duodenum: Secondary | ICD-10-CM | POA: Diagnosis not present

## 2023-01-26 DIAGNOSIS — K2970 Gastritis, unspecified, without bleeding: Secondary | ICD-10-CM | POA: Diagnosis not present

## 2023-01-26 DIAGNOSIS — R1012 Left upper quadrant pain: Secondary | ICD-10-CM | POA: Diagnosis present

## 2023-01-26 LAB — BASIC METABOLIC PANEL
Anion gap: 8 (ref 5–15)
BUN: 8 mg/dL (ref 6–20)
CO2: 28 mmol/L (ref 22–32)
Calcium: 9.1 mg/dL (ref 8.9–10.3)
Chloride: 102 mmol/L (ref 98–111)
Creatinine, Ser: 0.63 mg/dL (ref 0.44–1.00)
GFR, Estimated: >60 mL/min (ref >60)
Glucose, Bld: 102 mg/dL — ABNORMAL HIGH (ref 70–99)
Potassium: 4.2 mmol/L (ref 3.5–5.1)
Sodium: 138 mmol/L (ref 135–145)

## 2023-01-26 LAB — URINALYSIS, ROUTINE W REFLEX MICROSCOPIC
Bilirubin Urine: NEGATIVE
Glucose, UA: NEGATIVE mg/dL
Hgb urine dipstick: NEGATIVE
Ketones, ur: NEGATIVE mg/dL
Leukocytes,Ua: NEGATIVE
Nitrite: NEGATIVE
Protein, ur: NEGATIVE mg/dL
Specific Gravity, Urine: 1.015 (ref 1.005–1.030)
pH: 7 (ref 5.0–8.0)

## 2023-01-26 LAB — CBC
HCT: 41.2 % (ref 36.0–46.0)
HCT: 39.3 % (ref 36.0–46.0)
Hemoglobin: 13.5 g/dL (ref 12.0–15.0)
Hemoglobin: 12.6 g/dL (ref 12.0–15.0)
MCH: 28.4 pg (ref 26.0–34.0)
MCH: 28.4 pg (ref 26.0–34.0)
MCHC: 32.8 g/dL (ref 30.0–36.0)
MCHC: 32.1 g/dL (ref 30.0–36.0)
MCV: 86.7 fL (ref 80.0–100.0)
MCV: 88.7 fL (ref 80.0–100.0)
Platelets: 368 10*3/uL (ref 150–400)
Platelets: 315 10*3/uL (ref 150–400)
RBC: 4.75 MIL/uL (ref 3.87–5.11)
RBC: 4.43 MIL/uL (ref 3.87–5.11)
RDW: 14.2 % (ref 11.5–15.5)
RDW: 14.4 % (ref 11.5–15.5)
WBC: 11.3 10*3/uL — ABNORMAL HIGH (ref 4.0–10.5)
WBC: 10.2 10*3/uL (ref 4.0–10.5)
nRBC: 0 % (ref 0.0–0.2)
nRBC: 0 % (ref 0.0–0.2)

## 2023-01-26 LAB — LACTIC ACID, PLASMA: Lactic Acid, Venous: 1 mmol/L (ref 0.5–1.9)

## 2023-01-26 LAB — HEPATIC FUNCTION PANEL
ALT: 11 U/L (ref 0–44)
AST: 13 U/L — ABNORMAL LOW (ref 15–41)
Albumin: 4.2 g/dL (ref 3.5–5.0)
Alkaline Phosphatase: 129 U/L — ABNORMAL HIGH (ref 38–126)
Bilirubin, Direct: 0.1 mg/dL (ref 0.0–0.2)
Indirect Bilirubin: 0.2 mg/dL — ABNORMAL LOW (ref 0.3–0.9)
Total Bilirubin: 0.3 mg/dL (ref 0.3–1.2)
Total Protein: 7.3 g/dL (ref 6.5–8.1)

## 2023-01-26 LAB — RAPID URINE DRUG SCREEN, HOSP PERFORMED
Amphetamines: POSITIVE — AB
Barbiturates: NOT DETECTED
Benzodiazepines: NOT DETECTED
Cocaine: NOT DETECTED
Opiates: NOT DETECTED
Tetrahydrocannabinol: NOT DETECTED

## 2023-01-26 LAB — CREATININE, SERUM
Creatinine, Ser: 0.64 mg/dL (ref 0.44–1.00)
GFR, Estimated: >60 mL/min (ref >60)

## 2023-01-26 LAB — PREGNANCY, URINE: Preg Test, Ur: NEGATIVE

## 2023-01-26 LAB — CBG MONITORING, ED: Glucose-Capillary: 101 mg/dL — ABNORMAL HIGH (ref 70–99)

## 2023-01-26 LAB — TROPONIN I (HIGH SENSITIVITY)
Troponin I (High Sensitivity): 14 ng/L (ref <18)
Troponin I (High Sensitivity): 12 ng/L (ref <18)

## 2023-01-26 LAB — LIPASE, BLOOD: Lipase: 30 U/L (ref 11–51)

## 2023-01-26 MED ORDER — ONDANSETRON HCL 4 MG/2ML IJ SOLN
4.0000 mg | Freq: Four times a day (QID) | INTRAMUSCULAR | Status: DC | PRN
  Administered 2023-01-27: 4 mg via INTRAVENOUS
  Filled 2023-01-26: qty 2

## 2023-01-26 MED ORDER — KETOROLAC TROMETHAMINE 30 MG/ML IJ SOLN
30.0000 mg | Freq: Once | INTRAMUSCULAR | Status: AC
  Administered 2023-01-26: 30 mg via INTRAVENOUS
  Filled 2023-01-26: qty 1

## 2023-01-26 MED ORDER — IOHEXOL 350 MG/ML SOLN
100.0000 mL | Freq: Once | INTRAVENOUS | Status: AC | PRN
  Administered 2023-01-26: 100 mL via INTRAVENOUS

## 2023-01-26 MED ORDER — METOPROLOL SUCCINATE ER 25 MG PO TB24
25.0000 mg | ORAL_TABLET | Freq: Every day | ORAL | Status: DC
  Administered 2023-01-27: 25 mg via ORAL
  Filled 2023-01-26: qty 1

## 2023-01-26 MED ORDER — SODIUM CHLORIDE 0.9 % IV SOLN: INTRAVENOUS | Status: AC

## 2023-01-26 MED ORDER — ALBUTEROL SULFATE (2.5 MG/3ML) 0.083% IN NEBU: 2.5000 mg | INHALATION_SOLUTION | RESPIRATORY_TRACT | Status: DC | PRN

## 2023-01-26 MED ORDER — NICOTINE 21 MG/24HR TD PT24
21.0000 mg | MEDICATED_PATCH | Freq: Once | TRANSDERMAL | Status: AC
  Administered 2023-01-26: 21 mg via TRANSDERMAL
  Filled 2023-01-26: qty 1

## 2023-01-26 MED ORDER — CARMEX CLASSIC LIP BALM EX OINT
TOPICAL_OINTMENT | CUTANEOUS | Status: DC | PRN
  Administered 2023-01-26: 1 via TOPICAL
  Filled 2023-01-26: qty 10

## 2023-01-26 MED ORDER — SUCRALFATE 1 GM/10ML PO SUSP
1.0000 g | Freq: Three times a day (TID) | ORAL | Status: DC
  Administered 2023-01-26 – 2023-01-27 (×6): 1 g via ORAL
  Filled 2023-01-26 (×6): qty 10

## 2023-01-26 MED ORDER — VITAMIN B-12 1000 MCG PO TABS
1000.0000 ug | ORAL_TABLET | Freq: Every day | ORAL | Status: DC
  Administered 2023-01-27: 1000 ug via ORAL
  Filled 2023-01-26: qty 1

## 2023-01-26 MED ORDER — ONDANSETRON HCL 4 MG/2ML IJ SOLN
4.0000 mg | Freq: Once | INTRAMUSCULAR | Status: AC
  Administered 2023-01-26: 4 mg via INTRAVENOUS
  Filled 2023-01-26: qty 2

## 2023-01-26 MED ORDER — NITROGLYCERIN 0.4 MG SL SUBL: 0.4000 mg | SUBLINGUAL_TABLET | SUBLINGUAL | Status: DC | PRN

## 2023-01-26 MED ORDER — OXYCODONE HCL 5 MG PO TABS
5.0000 mg | ORAL_TABLET | ORAL | Status: DC | PRN
  Administered 2023-01-27 – 2023-01-28 (×5): 5 mg via ORAL
  Filled 2023-01-26 (×5): qty 1

## 2023-01-26 MED ORDER — FAMOTIDINE IN NACL 20-0.9 MG/50ML-% IV SOLN
20.0000 mg | Freq: Once | INTRAVENOUS | Status: AC
  Administered 2023-01-26: 20 mg via INTRAVENOUS
  Filled 2023-01-26: qty 50

## 2023-01-26 MED ORDER — NICOTINE 21 MG/24HR TD PT24: 21.0000 mg | MEDICATED_PATCH | Freq: Every day | TRANSDERMAL | Status: DC

## 2023-01-26 MED ORDER — ONDANSETRON HCL 4 MG PO TABS: 4.0000 mg | ORAL_TABLET | Freq: Four times a day (QID) | ORAL | Status: DC | PRN

## 2023-01-26 MED ORDER — ACETAMINOPHEN 10 MG/ML IV SOLN
1000.0000 mg | Freq: Once | INTRAVENOUS | Status: AC
  Administered 2023-01-26: 1000 mg via INTRAVENOUS
  Filled 2023-01-26: qty 100

## 2023-01-26 MED ORDER — HYDROMORPHONE HCL 1 MG/ML IJ SOLN
0.5000 mg | INTRAMUSCULAR | Status: DC | PRN
  Administered 2023-01-26 – 2023-01-28 (×5): 0.5 mg via INTRAVENOUS
  Filled 2023-01-26 (×6): qty 0.5

## 2023-01-26 MED ORDER — HEPARIN SODIUM (PORCINE) 5000 UNIT/ML IJ SOLN
5000.0000 [IU] | Freq: Three times a day (TID) | INTRAMUSCULAR | Status: DC
  Administered 2023-01-26 – 2023-01-28 (×5): 5000 [IU] via SUBCUTANEOUS
  Filled 2023-01-26 (×5): qty 1

## 2023-01-26 MED ORDER — HYDROMORPHONE HCL 1 MG/ML IJ SOLN
1.0000 mg | Freq: Once | INTRAMUSCULAR | Status: AC
  Administered 2023-01-26: 1 mg via INTRAVENOUS
  Filled 2023-01-26: qty 1

## 2023-01-26 MED ORDER — PREGABALIN 75 MG PO CAPS
75.0000 mg | ORAL_CAPSULE | Freq: Three times a day (TID) | ORAL | Status: DC | PRN
  Administered 2023-01-26 – 2023-01-28 (×2): 75 mg via ORAL
  Filled 2023-01-26 (×2): qty 1

## 2023-01-26 MED ORDER — FERROUS SULFATE 325 (65 FE) MG PO TABS
325.0000 mg | ORAL_TABLET | Freq: Every day | ORAL | Status: DC
  Administered 2023-01-27: 325 mg via ORAL
  Filled 2023-01-26: qty 1

## 2023-01-26 MED ORDER — LIDOCAINE VISCOUS HCL 2 % MT SOLN
15.0000 mL | Freq: Once | OROMUCOSAL | Status: AC
  Administered 2023-01-26: 15 mL via ORAL
  Filled 2023-01-26: qty 15

## 2023-01-26 MED ORDER — PANTOPRAZOLE SODIUM 40 MG IV SOLR
40.0000 mg | Freq: Two times a day (BID) | INTRAVENOUS | Status: DC
  Administered 2023-01-26 – 2023-01-27 (×3): 40 mg via INTRAVENOUS
  Filled 2023-01-26 (×4): qty 10

## 2023-01-26 MED ORDER — ATORVASTATIN CALCIUM 20 MG PO TABS
80.0000 mg | ORAL_TABLET | Freq: Every day | ORAL | Status: DC
  Administered 2023-01-27: 80 mg via ORAL
  Filled 2023-01-26: qty 4

## 2023-01-26 MED ORDER — IBUPROFEN 400 MG PO TABS: 800.0000 mg | ORAL_TABLET | Freq: Once | ORAL | Status: DC

## 2023-01-26 MED ORDER — FENTANYL CITRATE PF 50 MCG/ML IJ SOSY
50.0000 ug | PREFILLED_SYRINGE | Freq: Once | INTRAMUSCULAR | Status: AC
  Administered 2023-01-26: 50 ug via INTRAVENOUS
  Filled 2023-01-26: qty 1

## 2023-01-26 MED ORDER — VITAMIN C 500 MG PO TABS
500.0000 mg | ORAL_TABLET | Freq: Every day | ORAL | Status: DC
  Administered 2023-01-27: 500 mg via ORAL
  Filled 2023-01-26: qty 1

## 2023-01-26 MED ORDER — ALUM & MAG HYDROXIDE-SIMETH 200-200-20 MG/5ML PO SUSP
30.0000 mL | Freq: Once | ORAL | Status: AC
  Administered 2023-01-26: 30 mL via ORAL
  Filled 2023-01-26: qty 30

## 2023-01-26 MED ORDER — LACTATED RINGERS IV BOLUS
500.0000 mL | Freq: Once | INTRAVENOUS | Status: AC
  Administered 2023-01-26: 500 mL via INTRAVENOUS

## 2023-01-26 MED ORDER — LEVOFLOXACIN IN D5W 750 MG/150ML IV SOLN
750.0000 mg | INTRAVENOUS | Status: DC
  Administered 2023-01-27 (×2): 750 mg via INTRAVENOUS
  Filled 2023-01-26 (×2): qty 150

## 2023-01-26 NOTE — ED Notes (Signed)
Report given to Susan, RN

## 2023-01-26 NOTE — ED Provider Notes (Signed)
[place] [facility] AT [facility] Provider Note   CSN: [id number] Arrival date & time: 01/26/23  1043     History  Chief Complaint  Patient presents with   Chest Pain    [name] is a 52 y.o. female.   Chest Pain    52 year old female with medical history significant for coronary artery disease status post CABG x 4, cardiomyopathy, chronic back pain status post lumbar fusion, myofascial pain syndrome, atrial fibrillation, ADHD, fibromyalgia, GERD, HLD who presents to the emergency department with sudden onset left upper quadrant abdominal pain radiating to her chest.  Patient states that the pain has been on and off for the past couple of weeks but worsens at night lying flat.  She endorses severe burning and sharp pain in her left upper quadrant.  No ripping or tearing component, radiating to her back.  She has a history of cholecystectomy, she denies any alcohol use.  She denies any nausea or vomiting.  No fevers or chills.  Home Medications Prior to Admission medications   Medication Sig Start Date End Date Taking? Authorizing Provider  albuterol (VENTOLIN HFA) 108 (90 Base) MCG/ACT inhaler Inhale 2 puffs into the lungs every 4 (four) hours as needed for wheezing or shortness of breath. 10/15/22   Leary Roca, PA-C  amphetamine-dextroamphetamine (ADDERALL XR) 30 MG 24 hr capsule Take 60 mg by mouth daily.    [provider]  aspirin EC 325 MG tablet Take 1 tablet (325 mg total) by mouth daily. 10/15/22   Leary Roca, PA-C  atorvastatin (LIPITOR) 80 MG tablet Take 1 tablet (80 mg total) by mouth daily. 09/09/22   Sharlene Dory, PA-C  escitalopram (LEXAPRO) 10 MG tablet Take 10 mg by mouth daily as needed (anxiety).    [provider]  Fe Fum-Vit C-Vit B12-FA (TRIGELS-F FORTE) CAPS capsule Take 1 capsule by mouth daily after breakfast for 1 month. 10/15/22   Leary Roca, PA-C  furosemide (LASIX) 20 MG tablet Take  1 tablet (20 mg total) by mouth daily as needed (for lower extremity edema). 11/02/22   Sharlene Dory, PA-C  gabapentin (NEURONTIN) 100 MG capsule Take 100-200 mg by mouth 4 (four) times daily as needed (pain).    [provider]  gabapentin (NEURONTIN) 300 MG capsule Take 600 mg by mouth daily as needed (pain).    [provider]  guaiFENesin (MUCINEX) 600 MG 12 hr tablet Take 1 tablet (600 mg total) by mouth 2 (two) times daily for 1 week. 10/15/22   Leary Roca, PA-C  lactulose (CHRONULAC) 10 GM/15ML solution Take 30 mLs (20 g total) by mouth daily as needed for mild constipation. 10/15/22   Leary Roca, PA-C  methocarbamol (ROBAXIN) 500 MG tablet Take 500 mg by mouth daily as needed for muscle spasms.    [provider]  metoprolol succinate (TOPROL-XL) 25 MG 24 hr tablet Take 1 tablet (25 mg total) by mouth in the morning and at bedtime. 09/25/22   Kathleene Hazel, MD  nicotine (NICODERM CQ - DOSED IN MG/24 HOURS) 14 mg/24hr patch APPLY 1 PATCH TO SKIN EVERY DAY FOR 30 DAYS 01/06/23   Alleen Borne, MD  nitroGLYCERIN (NITROSTAT) 0.4 MG SL tablet Place 1 tablet (0.4 mg total) under the tongue every 5 (five) minutes as needed for chest pain. 07/24/22 11/04/22  Sharlene Dory, PA-C  omeprazole (PRILOSEC) 20 MG capsule Take 20 mg by mouth daily.  [provider]  zolpidem (AMBIEN) 10 MG tablet Take 10 mg by mouth at bedtime.    [provider]      Allergies    Zoloft [sertraline]    Review of Systems   Review of Systems  Cardiovascular:  Positive for chest pain.  All other systems reviewed and are negative.   Physical Exam Updated Vital Signs BP (!) 137/59   Pulse (!) 108   Temp 98.8 F (37.1 C) (Oral)   Resp 20   SpO2 97%  Physical Exam Vitals and nursing note reviewed. Exam conducted with a chaperone present.  Constitutional:      General: She is not in acute distress.    Appearance: She is well-developed.   HENT:     Head: Normocephalic and atraumatic.  Eyes:     Conjunctiva/sclera: Conjunctivae normal.  Cardiovascular:     Rate and Rhythm: Normal rate and regular rhythm.     Pulses: Normal pulses.     Comments: Intact and symmetric radial pulses Pulmonary:     Effort: Pulmonary effort is normal. No respiratory distress.     Breath sounds: Normal breath sounds.  Chest:     Comments: Sternotomy scar in place along the chest wall Abdominal:     Palpations: Abdomen is soft.     Tenderness: There is abdominal tenderness in the epigastric area and left upper quadrant. There is no guarding or rebound.  Musculoskeletal:        General: No swelling.     Cervical back: Neck supple.  Skin:    General: Skin is warm and dry.     Capillary Refill: Capillary refill takes less than 2 seconds.  Neurological:     Mental Status: She is alert.  Psychiatric:        Mood and Affect: Mood normal.     ED Results / Procedures / Treatments   Labs (all labs ordered are listed, but only abnormal results are displayed) Labs Reviewed  BASIC METABOLIC PANEL - Abnormal; Notable for the following components:      Result Value   Glucose, Bld 102 (*)    All other components within normal limits  CBC - Abnormal; Notable for the following components:   WBC 11.3 (*)    All other components within normal limits  HEPATIC FUNCTION PANEL - Abnormal; Notable for the following components:   AST 13 (*)    Alkaline Phosphatase 129 (*)    Indirect Bilirubin 0.2 (*)    All other components within normal limits  CBG MONITORING, ED - Abnormal; Notable for the following components:   Glucose-Capillary 101 (*)    All other components within normal limits  PREGNANCY, URINE  LIPASE, BLOOD  URINALYSIS, ROUTINE W REFLEX MICROSCOPIC  RAPID URINE DRUG SCREEN, HOSP PERFORMED  TROPONIN I (HIGH SENSITIVITY)  TROPONIN I (HIGH SENSITIVITY)    EKG EKG Interpretation Date/Time:  Tuesday January 26 2023 11:20:25  EDT Ventricular Rate:  105 PR Interval:  148 QRS Duration:  80 QT Interval:  356 QTC Calculation: 470 R Axis:   64  Text Interpretation: Sinus tachycardia Otherwise normal ECG When compared with ECG of 26-Jan-2023 11:12, PREVIOUS ECG IS PRESENT Confirmed by Ernie Avena ([id number]) on 01/26/2023 11:31:47 AM  Radiology CT Angio Chest PE W and/or Wo Contrast  Result Date: 01/26/2023 CLINICAL DATA:  Intermittent left-sided rib pain for the past few weeks. EXAM: CT ANGIOGRAPHY CHEST CT ABDOMEN AND PELVIS WITH CONTRAST TECHNIQUE: Multidetector CT imaging of the chest was  performed using the standard protocol during bolus administration of intravenous contrast. Multiplanar CT image reconstructions and MIPs were obtained to evaluate the vascular anatomy. Multidetector CT imaging of the abdomen and pelvis was performed using the standard protocol during bolus administration of intravenous contrast. RADIATION DOSE REDUCTION: This exam was performed according to the departmental dose-optimization program which includes automated exposure control, adjustment of the mA and/or kV according to patient size and/or use of iterative reconstruction technique. CONTRAST:  OMNIPAQUE IOHEXOL 350 MG/ML SOLN COMPARISON:  CT abdomen pelvis dated October 02, 2021. FINDINGS: CTA CHEST FINDINGS Cardiovascular: Satisfactory opacification of the pulmonary arteries to the segmental level. No evidence of pulmonary embolism. Mild cardiomegaly status post CABG and left atrial appendage clipping. Trace pericardial effusion. No thoracic aortic aneurysm or dissection. Coronary, aortic arch, and branch vessel atherosclerotic vascular disease. Mediastinum/Nodes: No enlarged mediastinal, hilar, or axillary lymph nodes. Thyroid gland, trachea, and esophagus demonstrate no significant findings. Lungs/Pleura: Ill-defined tiny centrilobular ground-glass densities throughout both lungs. Mild mosaic attenuation. No focal consolidation, pleural  effusion, or pneumothorax. Musculoskeletal: No chest wall abnormality. No acute or significant osseous findings. Prior median sternotomy with nonunion. Review of the MIP images confirms the above findings. CT ABDOMEN PELVIS FINDINGS Hepatobiliary: No focal liver abnormality is seen. Status post cholecystectomy. No biliary dilatation. Pancreas: Unremarkable. No pancreatic ductal dilatation or surrounding inflammatory changes. Spleen: Normal in size without focal abnormality. Adrenals/Urinary Tract: Unchanged 1.2 cm right and 1.8 cm left adrenal nodules previously characterized as adenomas. No follow-up imaging is recommended. No renal calculi, mass, or hydronephrosis. The bladder is unremarkable. Stomach/Bowel: Stomach is within normal limits. Appendix appears normal. No evidence of bowel wall thickening, distention, or inflammatory changes. Mild sigmoid colonic diverticulosis. Vascular/Lymphatic: Aortic atherosclerosis. No enlarged abdominal or pelvic lymph nodes. Reproductive: Uterus and bilateral adnexa are unremarkable. Other: No abdominal wall hernia or abnormality. No abdominopelvic ascites. No pneumoperitoneum. Musculoskeletal: No acute or significant osseous findings. Review of the MIP images confirms the above findings. IMPRESSION: CT chest: 1. No evidence of pulmonary embolism. 2. Ill-defined tiny centrilobular ground-glass densities throughout both lungs with mild mosaic attenuation. Differential considerations include respiratory bronchiolitis and hypersensitivity pneumonitis. 3.  Aortic Atherosclerosis (ICD10-I70.0). CT abdomen pelvis: 1.  No acute intra-abdominal process. Electronically Signed   By: Obie Dredge M.D.   On: 01/26/2023 13:49   CT ABDOMEN PELVIS W CONTRAST  Result Date: 01/26/2023 CLINICAL DATA:  Intermittent left-sided rib pain for the past few weeks. EXAM: CT ANGIOGRAPHY CHEST CT ABDOMEN AND PELVIS WITH CONTRAST TECHNIQUE: Multidetector CT imaging of the chest was performed using  the standard protocol during bolus administration of intravenous contrast. Multiplanar CT image reconstructions and MIPs were obtained to evaluate the vascular anatomy. Multidetector CT imaging of the abdomen and pelvis was performed using the standard protocol during bolus administration of intravenous contrast. RADIATION DOSE REDUCTION: This exam was performed according to the departmental dose-optimization program which includes automated exposure control, adjustment of the mA and/or kV according to patient size and/or use of iterative reconstruction technique. CONTRAST:  OMNIPAQUE IOHEXOL 350 MG/ML SOLN COMPARISON:  CT abdomen pelvis dated October 02, 2021. FINDINGS: CTA CHEST FINDINGS Cardiovascular: Satisfactory opacification of the pulmonary arteries to the segmental level. No evidence of pulmonary embolism. Mild cardiomegaly status post CABG and left atrial appendage clipping. Trace pericardial effusion. No thoracic aortic aneurysm or dissection. Coronary, aortic arch, and branch vessel atherosclerotic vascular disease. Mediastinum/Nodes: No enlarged mediastinal, hilar, or axillary lymph nodes. Thyroid gland, trachea, and esophagus demonstrate no significant findings.  Lungs/Pleura: Ill-defined tiny centrilobular ground-glass densities throughout both lungs. Mild mosaic attenuation. No focal consolidation, pleural effusion, or pneumothorax. Musculoskeletal: No chest wall abnormality. No acute or significant osseous findings. Prior median sternotomy with nonunion. Review of the MIP images confirms the above findings. CT ABDOMEN PELVIS FINDINGS Hepatobiliary: No focal liver abnormality is seen. Status post cholecystectomy. No biliary dilatation. Pancreas: Unremarkable. No pancreatic ductal dilatation or surrounding inflammatory changes. Spleen: Normal in size without focal abnormality. Adrenals/Urinary Tract: Unchanged 1.2 cm right and 1.8 cm left adrenal nodules previously characterized as adenomas. No  follow-up imaging is recommended. No renal calculi, mass, or hydronephrosis. The bladder is unremarkable. Stomach/Bowel: Stomach is within normal limits. Appendix appears normal. No evidence of bowel wall thickening, distention, or inflammatory changes. Mild sigmoid colonic diverticulosis. Vascular/Lymphatic: Aortic atherosclerosis. No enlarged abdominal or pelvic lymph nodes. Reproductive: Uterus and bilateral adnexa are unremarkable. Other: No abdominal wall hernia or abnormality. No abdominopelvic ascites. No pneumoperitoneum. Musculoskeletal: No acute or significant osseous findings. Review of the MIP images confirms the above findings. IMPRESSION: CT chest: 1. No evidence of pulmonary embolism. 2. Ill-defined tiny centrilobular ground-glass densities throughout both lungs with mild mosaic attenuation. Differential considerations include respiratory bronchiolitis and hypersensitivity pneumonitis. 3.  Aortic Atherosclerosis (ICD10-I70.0). CT abdomen pelvis: 1.  No acute intra-abdominal process. Electronically Signed   By: Obie Dredge M.D.   On: 01/26/2023 13:49   DG Chest Portable 1 View  Result Date: 01/26/2023 CLINICAL DATA:  Chest pain EXAM: PORTABLE CHEST 1 VIEW COMPARISON:  11/02/2022 FINDINGS: Cardiomegaly status post median sternotomy with left atrial appendage clip. Mild diffuse bilateral interstitial opacity. Osseous structures unremarkable. IMPRESSION: Cardiomegaly with mild diffuse bilateral interstitial opacity, consistent with edema or atypical/viral infection. No focal airspace opacity. Electronically Signed   By: Jearld Lesch M.D.   On: 01/26/2023 12:42    Procedures Procedures    Medications Ordered in ED Medications  nicotine (NICODERM CQ - dosed in mg/24 hours) patch 21 mg (21 mg Transdermal Patch Applied 01/26/23 1203)  HYDROmorphone (DILAUDID) injection 1 mg (has no administration in time range)  pantoprazole (PROTONIX) injection 40 mg (has no administration in time range)   sucralfate (CARAFATE) 1 GM/10ML suspension 1 g (has no administration in time range)  fentaNYL (SUBLIMAZE) injection 50 mcg (50 mcg Intravenous Given 01/26/23 1136)  ondansetron (ZOFRAN) injection 4 mg (4 mg Intravenous Given 01/26/23 1135)  lactated ringers bolus 500 mL (0 mLs Intravenous Stopped 01/26/23 1205)  iohexol (OMNIPAQUE) 350 MG/ML injection 100 mL (100 mLs Intravenous Contrast Given 01/26/23 1207)  fentaNYL (SUBLIMAZE) injection 50 mcg (50 mcg Intravenous Given 01/26/23 1238)  HYDROmorphone (DILAUDID) injection 1 mg (1 mg Intravenous Given 01/26/23 1345)  famotidine (PEPCID) IVPB 20 mg premix (0 mg Intravenous Stopped 01/26/23 1506)  alum & mag hydroxide-simeth (MAALOX/MYLANTA) 200-200-20 MG/5ML suspension 30 mL (30 mLs Oral Given 01/26/23 1414)    And  lidocaine (XYLOCAINE) 2 % viscous mouth solution 15 mL (15 mLs Oral Given 01/26/23 1414)    ED Course/ Medical Decision Making/ A&P                             Medical Decision Making Amount and/or Complexity of Data Reviewed Labs: ordered. Radiology: ordered.  Risk OTC drugs. Prescription drug management. Decision regarding hospitalization.    52 year old female with medical history significant for coronary artery disease status post CABG x 4, cardiomyopathy, chronic back pain status post lumbar fusion, myofascial pain syndrome, atrial fibrillation, ADHD, fibromyalgia,  GERD, HLD who presents to the emergency department with sudden onset left upper quadrant abdominal pain radiating to her chest.  Patient states that the pain has been on and off for the past couple of weeks but worsens at night lying flat.  She endorses severe burning and sharp pain in her left upper quadrant.  No ripping or tearing component, radiating to her back.  She has a history of cholecystectomy, she denies any alcohol use.  She denies any nausea or vomiting.  No fevers or chills.  On arrival, the patient was afebrile, tachycardic heart rate 110, BP 141/94,  saturating 98% on room air.  Physical exam significant for left upper quadrant tenderness to palpation, mild guarding present.  Differential diagnosis includes ACS, PE, GERD/gastritis, choledocholithiasis, pancreatitis, small bowel obstruction, diverticulitis, less likely aortic dissection, ruptured AAA.  On arrival, the patient's initial EKG revealed sinus tachycardia, ventricular rate 105, otherwise unremarkable EKG with no acute ischemic changes.  Laboratory evaluation significant for CBG 101, troponins x 2 negative, urine pregnancy negative, lipase normal, CBC with a nonspecific leukocytosis to 11.3, no anemia, BMP without significant electrolyte abnormality, normal renal function, hepatic function panel with no significant biliary dysfunction or abnormal LFTs.  Chest x-ray revealed no acute cardiopulmonary changes.  CTA PE study was performed due to the patient's sharp pain in her left upper quadrant radiating to her left chest.  CT abdomen pelvis also performed. Narrative  CLINICAL DATA:  Intermittent left-sided rib pain for the past few  weeks.    EXAM:  CT ANGIOGRAPHY CHEST    CT ABDOMEN AND PELVIS WITH CONTRAST    TECHNIQUE:  Multidetector CT imaging of the chest was performed using the  standard protocol during bolus administration of intravenous  contrast. Multiplanar CT image reconstructions and MIPs were  obtained to evaluate the vascular anatomy. Multidetector CT imaging  of the abdomen and pelvis was performed using the standard protocol  during bolus administration of intravenous contrast.    RADIATION DOSE REDUCTION: This exam was performed according to the  departmental dose-optimization program which includes automated  exposure control, adjustment of the mA and/or kV according to  patient size and/or use of iterative reconstruction technique.    CONTRAST:  OMNIPAQUE IOHEXOL 350 MG/ML SOLN    COMPARISON:  CT abdomen pelvis dated October 02, 2021.    FINDINGS:   CTA CHEST FINDINGS    Cardiovascular: Satisfactory opacification of the pulmonary arteries  to the segmental level. No evidence of pulmonary embolism. Mild  cardiomegaly status post CABG and left atrial appendage clipping.  Trace pericardial effusion. No thoracic aortic aneurysm or  dissection. Coronary, aortic arch, and branch vessel atherosclerotic  vascular disease.    Mediastinum/Nodes: No enlarged mediastinal, hilar, or axillary lymph  nodes. Thyroid gland, trachea, and esophagus demonstrate no  significant findings.    Lungs/Pleura: Ill-defined tiny centrilobular ground-glass densities  throughout both lungs. Mild mosaic attenuation. No focal  consolidation, pleural effusion, or pneumothorax.    Musculoskeletal: No chest wall abnormality. No acute or significant  osseous findings. Prior median sternotomy with nonunion.    Review of the MIP images confirms the above findings.    CT ABDOMEN PELVIS FINDINGS    Hepatobiliary: No focal liver abnormality is seen. Status post  cholecystectomy. No biliary dilatation.    Pancreas: Unremarkable. No pancreatic ductal dilatation or  surrounding inflammatory changes.    Spleen: Normal in size without focal abnormality.    Adrenals/Urinary Tract: Unchanged 1.2 cm right and 1.8 cm left  adrenal nodules previously characterized as adenomas. No follow-up  imaging is recommended. No renal calculi, mass, or hydronephrosis.  The bladder is unremarkable.    Stomach/Bowel: Stomach is within normal limits. Appendix appears  normal. No evidence of bowel wall thickening, distention, or  inflammatory changes. Mild sigmoid colonic diverticulosis.    Vascular/Lymphatic: Aortic atherosclerosis. No enlarged abdominal or  pelvic lymph nodes.    Reproductive: Uterus and bilateral adnexa are unremarkable.    Other: No abdominal wall hernia or abnormality. No abdominopelvic  ascites. No pneumoperitoneum.    Musculoskeletal: No acute or  significant osseous findings.    Review of the MIP images confirms the above findings.    IMPRESSION:  CT chest:    1. No evidence of pulmonary embolism.  2. Ill-defined tiny centrilobular ground-glass densities throughout  both lungs with mild mosaic attenuation. Differential considerations  include respiratory bronchiolitis and hypersensitivity pneumonitis.  3.  Aortic Atherosclerosis (ICD10-I70.0).    CT abdomen pelvis:    1.  No acute intra-abdominal process.   No evidence of PE, no evidence of dissection.  I reviewed the CT imaging which did have contrast opacification of both the pulmonary arteries and intra-thoracic aorta.  No evidence of pancreatitis.  She was administered IV fluid bolus, IV Zofran, IV fentanyl, IV Dilaudid, Maalox and lidocaine in addition to IV Pepcid.  She continues to endorse severe left upper quadrant abdominal pain.  She has intact symmetric pulses, no ripping or tearing component that radiates to the back, lower concern for aortic dissection.  Given the patient's history of CAD and CABG, ongoing discomfort, plan for admission for observation and pain control.  The patient also requested I reach out to on-call gastroenterology as she is concerned that her symptoms could be gastrointestinal in nature.  Suspect potential severe gastritis, could consider peptic ulcer disease.  Cardiology consulted in the setting of the patient's severe left upper quadrant pain.  No hematemesis, no melena or hematochezia.  Given the patient's intractable pain, hospitalist medicine was consulted for admission.  Lower suspicion for cardiac etiology of the patient's discomfort, plan for inpatient cardiology consultation as needed.  I spoke with Dr. Lorenso Quarry of on-call [facility] gastroenterology who will see the patient in consultation tomorrow, adding on Protonix and Carafate.  Hospitalist medicine was consulted and accepted the patient in admission for observation.    Final Clinical  Impression(s) / ED Diagnoses Final diagnoses:  Chest pain, unspecified type  LUQ abdominal pain    Rx / DC Orders ED Discharge Orders     None         Ernie Avena, MD 01/26/23 1615

## 2023-01-26 NOTE — ED Triage Notes (Signed)
C/O rib pain, on and off for a couple of weeks, but worsen since last night.

## 2023-01-26 NOTE — H&P (Addendum)
History and Physical    [name] ZOX:[id number] DOB: [birth year] DOA: 01/26/2023  PCP: Noberto Retort, MD  Patient coming from: [facility] I have personally briefly reviewed patient's old medical records in [facility]  Chief Complaint: left upper quadrant abdominal pain radiating to her chest  HPI: [name] is a 52 y.o. female with medical history significant of  coronary artery bypass graft surgery x 3 with pulmonary vein isolation ablation and ligation of left atrial appendage on 10/08/2022,  atrial fibrillation, ischemic cardiomyopathy, anxiety, fibromyalgia, GERD, arthritis, HLD, diverticulitis/colitis, ,tobacco abuse  who presents to [facility]  with complaint of left upper quadrant abdominal pain radiation to her chest. Patient states pain started around 2-3 weeks ago. She states at that time it was mild and intermittent. She describes it as sharp pain that is worse with deep breathing located under her left anterior lower rib. She also notes pain with movement as well and lying flat.  She notes no n/v/d/increase sob other than due to pain, palpitations / or chest pressure. She notes no presyncope. She does note intermittent periods of feeling warm as if she has had a fever. She also noted intermittent constipation. She also notes frequent aspiration events since surgery, she states its as if I forgot how to swallow properly.   ED Course:  On arrival to ED: afeb, BP141/94, HR110, RR22 sat 98%   Afeb 99, BP121/88, HR 105, RR 20 sat 96% Wbc 11.3, hgb 13.5, plt368 Na:138, K 4.2, CL102, cr 0.63 Trop I-14 EKG: sinus tachycardia  ALKphos: 129 Lipase 30    CTPE/CTAB/Pelvis:   1. No evidence of pulmonary embolism. 2. Ill-defined tiny centrilobular ground-glass densities throughout both lungs with mild mosaic attenuation. Differential considerations include respiratory bronchiolitis and hypersensitivity pneumonitis. 3.  Aortic Atherosclerosis  (ICD10-I70.0).   CT abdomen pelvis:   1.  No acute intra-abdominal process.  UA:neg  UDS :+ amphetamines ( on adderal)   Patient was discussed with GI Dr Lorenso Quarry who will consult on patient in am  Patient is admitted for pain control further evaluation  TX LR 500, zofran, sublimaze , dilaudid, maalox/mylanta , pepcid 20mg  , sucralfate  Review of Systems: As per HPI otherwise 10 point review of systems negative.   Past Medical History:  Diagnosis Date   A-fib (HCC)    Anxiety    Arthralgia    Asthma    Attention-deficit hyperactivity disorder, predominantly inattentive type    Back pain    Dysrhythmia    Afib   Fear of flying    Fibromyalgia    Gastroesophageal reflux disease    HPV in female    Joint pain    Localized edema    Neck pain    OA (osteoarthritis)    Papillomavirus as the cause of diseases classified elsewhere    Primary insomnia    Pure hypercholesterolemia    Rheumatoid arthritis, seronegative, hand, unspecified laterality (HCC)    patient feels it is back related, not a true arthritis   Spondylolisthesis of lumbosacral region    Tobacco dependence    Unspecified inflammatory spondylopathy, cervical region (HCC)    Vitamin B12 deficiency anemia    Voice hoarseness     Past Surgical History:  Procedure Laterality Date   ABDOMINAL EXPOSURE N/A 06/28/2020   Procedure: ABDOMINAL EXPOSURE;  Surgeon: Cephus Shelling, MD;  Location: [facility] OR;  Service: Vascular;  Laterality: N/A;  anterior   ANTERIOR LUMBAR FUSION N/A  06/28/2020   Procedure: ANTERIOR LUMBAR INTERBODY FUSION LUMBAR FIVE-SACRAL ONE.;  Surgeon: Tia Alert, MD;  Location: [facility] OR;  Service: Neurosurgery;  Laterality: N/A;  anterior approach   BREAST ENHANCEMENT SURGERY     CARPAL TUNNEL RELEASE Left    CARPAL TUNNEL RELEASE Right 06/28/2020   Procedure: CARPAL TUNNEL RELEASE;  Surgeon: Tia Alert, MD;  Location: [facility] OR;  Service: Neurosurgery;  Laterality: Right;   CERVICAL  SPINE SURGERY     CHOLECYSTECTOMY     CLIPPING OF ATRIAL APPENDAGE N/A 10/08/2022   Procedure: CLIPPING OF ATRIAL APPENDAGE USING ATRICURE ATRICLIP PRO2 SIZE 35;  Surgeon: Alleen Borne, MD;  Location: [facility] OR;  Service: Open Heart Surgery;  Laterality: N/A;   CORONARY ARTERY BYPASS GRAFT N/A 10/08/2022   Procedure: CORONARY ARTERY BYPASS GRAFTING (CABG) X3 USING LEFT INTERNAL MAMMARY ARTERY AND RIGHT GREATER SAPHENOUS VEIN HARVESTED ENDOSCOPICALLY;  Surgeon: Alleen Borne, MD;  Location: [facility] OR;  Service: Open Heart Surgery;  Laterality: N/A;   CORONARY PRESSURE/FFR STUDY N/A 09/07/2022   Procedure: INTRAVASCULAR PRESSURE WIRE/FFR STUDY;  Surgeon: Yvonne Kendall, MD;  Location: [facility] INVASIVE CV LAB;  Service: Cardiovascular;  Laterality: N/A;   FOOT SURGERY     MANDIBLE SURGERY     MAZE N/A 10/08/2022   Procedure: PULMONARY VEIN ABLATION USING ATRICURE ISOLATOR SYNERGY ENCOMPASS OLH;  Surgeon: Alleen Borne, MD;  Location: [facility] OR;  Service: Open Heart Surgery;  Laterality: N/A;   RIGHT/LEFT HEART CATH AND CORONARY ANGIOGRAPHY N/A 09/07/2022   Procedure: RIGHT/LEFT HEART CATH AND CORONARY ANGIOGRAPHY;  Surgeon: Yvonne Kendall, MD;  Location: [facility] INVASIVE CV LAB;  Service: Cardiovascular;  Laterality: N/A;   TEE WITHOUT CARDIOVERSION N/A 10/08/2022   Procedure: TRANSESOPHAGEAL ECHOCARDIOGRAM;  Surgeon: Alleen Borne, MD;  Location: [facility] OR;  Service: Open Heart Surgery;  Laterality: N/A;   TONSILLECTOMY     TUBAL LIGATION     with ablation   TYMPANOSTOMY TUBE PLACEMENT       reports that she has been smoking cigarettes. She has never used smokeless tobacco. She reports current alcohol use. She reports that she does not use drugs.  Allergies  Allergen Reactions   Zoloft [Sertraline] Other (See Comments)    Altered mental state    Family History  Problem Relation Age of Onset   Hypertension Father    Heart disease Father     Prior to Admission medications   Medication Sig Start Date  End Date Taking? Authorizing Provider  albuterol (VENTOLIN HFA) 108 (90 Base) MCG/ACT inhaler Inhale 2 puffs into the lungs every 4 (four) hours as needed for wheezing or shortness of breath. 10/15/22   Leary Roca, PA-C  amphetamine-dextroamphetamine (ADDERALL XR) 30 MG 24 hr capsule Take 60 mg by mouth daily.    [provider]  aspirin EC 325 MG tablet Take 1 tablet (325 mg total) by mouth daily. 10/15/22   Leary Roca, PA-C  atorvastatin (LIPITOR) 80 MG tablet Take 1 tablet (80 mg total) by mouth daily. 09/09/22   Sharlene Dory, PA-C  escitalopram (LEXAPRO) 10 MG tablet Take 10 mg by mouth daily as needed (anxiety).    [provider]  Fe Fum-Vit C-Vit B12-FA (TRIGELS-F FORTE) CAPS capsule Take 1 capsule by mouth daily after breakfast for 1 month. 10/15/22   Leary Roca, PA-C  furosemide (LASIX) 20 MG tablet Take 1 tablet (20 mg total) by mouth daily as needed (for lower extremity edema). 11/02/22   Jari Favre  N, PA-C  gabapentin (NEURONTIN) 100 MG capsule Take 100-200 mg by mouth 4 (four) times daily as needed (pain).    [provider]  gabapentin (NEURONTIN) 300 MG capsule Take 600 mg by mouth daily as needed (pain).    [provider]  guaiFENesin (MUCINEX) 600 MG 12 hr tablet Take 1 tablet (600 mg total) by mouth 2 (two) times daily for 1 week. 10/15/22   Leary Roca, PA-C  lactulose (CHRONULAC) 10 GM/15ML solution Take 30 mLs (20 g total) by mouth daily as needed for mild constipation. 10/15/22   Leary Roca, PA-C  methocarbamol (ROBAXIN) 500 MG tablet Take 500 mg by mouth daily as needed for muscle spasms.    [provider]  metoprolol succinate (TOPROL-XL) 25 MG 24 hr tablet Take 1 tablet (25 mg total) by mouth in the morning and at bedtime. 09/25/22   Kathleene Hazel, MD  nicotine (NICODERM CQ - DOSED IN MG/24 HOURS) 14 mg/24hr patch APPLY 1 PATCH TO SKIN EVERY DAY FOR 30 DAYS 01/06/23   Alleen Borne, MD  nitroGLYCERIN (NITROSTAT) 0.4 MG SL tablet Place 1 tablet (0.4 mg total) under the tongue every 5 (five) minutes as needed for chest pain. 07/24/22 11/04/22  Sharlene Dory, PA-C  omeprazole (PRILOSEC) 20 MG capsule Take 20 mg by mouth daily.    [provider]  zolpidem (AMBIEN) 10 MG tablet Take 10 mg by mouth at bedtime.    [provider]    Physical Exam: Vitals:   01/26/23 1500 01/26/23 1630 01/26/23 1730 01/26/23 1851  BP: (!) 137/59 (!) 142/89 130/85 121/88  Pulse: (!) 108 (!) 105 (!) 105 (!) 105  Resp: 20 18 20 20   Temp: 98.8 F (37.1 C)   99 F (37.2 C)  TempSrc: Oral   Oral  SpO2: 97% 95% 96% 96%    Constitutional: NAD, calm, comfortable, appears to be in some discomfort Vitals:   01/26/23 1500 01/26/23 1630 01/26/23 1730 01/26/23 1851  BP: (!) 137/59 (!) 142/89 130/85 121/88  Pulse: (!) 108 (!) 105 (!) 105 (!) 105  Resp: 20 18 20 20   Temp: 98.8 F (37.1 C)   99 F (37.2 C)  TempSrc: Oral   Oral  SpO2: 97% 95% 96% 96%   Eyes: PERRL, lids and conjunctivae normal ENMT: Mucous membranes are dry, Posterior pharynx clear of any exudate or lesions.Normal dentition.  Neck: normal, supple, no masses, no thyromegaly Respiratory:  bilaterally crackles, no wheezing,  Normal respiratory effort. No accessory muscle use.  Cardiovascular: Regular rate and rhythm, no murmurs / rubs / gallops. No extremity edema. 2+ pedal pulses.  Abdomen: right quad pain and epigastric pain with palpation , no left upper quad pain ,no masses palpated. No hepatosplenomegaly. Bowel sounds positive.  Musculoskeletal: no clubbing / cyanosis. No joint deformity upper and lower extremities. Good ROM, no contractures. Normal muscle tone.  Skin: no rashes, lesions, ulcers. No induration Neurologic: CN 2-12 grossly intact. Sensation intact,  Strength 5/5 in all 4.  Psychiatric: Normal judgment and insight. Alert and oriented x 3. Normal mood.    Labs on Admission: I have personally  reviewed following labs and imaging studies  CBC: Recent Labs  Lab 01/26/23 1106  WBC 11.3*  HGB 13.5  HCT 41.2  MCV 86.7  PLT 368   Basic Metabolic Panel: Recent Labs  Lab 01/26/23 1106  NA 138  K 4.2  CL 102  CO2 28  GLUCOSE 102*  BUN 8  CREATININE 0.63  CALCIUM 9.1   GFR: CrCl cannot be calculated (Unknown ideal weight.). Liver Function Tests: Recent Labs  Lab 01/26/23 1124  AST 13*  ALT 11  ALKPHOS 129*  BILITOT 0.3  PROT 7.3  ALBUMIN 4.2   Recent Labs  Lab 01/26/23 1124  LIPASE 30   No results for input(s): "AMMONIA" in the last 168 hours. Coagulation Profile: No results for input(s): "INR", "PROTIME" in the last 168 hours. Cardiac Enzymes: No results for input(s): "CKTOTAL", "CKMB", "CKMBINDEX", "TROPONINI" in the last 168 hours. BNP (last 3 results) No results for input(s): "PROBNP" in the last 8760 hours. HbA1C: No results for input(s): "HGBA1C" in the last 72 hours. CBG: Recent Labs  Lab 01/26/23 1102  GLUCAP 101*   Lipid Profile: No results for input(s): "CHOL", "HDL", "LDLCALC", "TRIG", "CHOLHDL", "LDLDIRECT" in the last 72 hours. Thyroid Function Tests: No results for input(s): "TSH", "T4TOTAL", "FREET4", "T3FREE", "THYROIDAB" in the last 72 hours. Anemia Panel: No results for input(s): "VITAMINB12", "FOLATE", "FERRITIN", "TIBC", "IRON", "RETICCTPCT" in the last 72 hours. Urine analysis:    Component Value Date/Time   COLORURINE YELLOW 01/26/2023 1531   APPEARANCEUR CLEAR 01/26/2023 1531   LABSPEC 1.015 01/26/2023 1531   PHURINE 7.0 01/26/2023 1531   GLUCOSEU NEGATIVE 01/26/2023 1531   HGBUR NEGATIVE 01/26/2023 1531   BILIRUBINUR NEGATIVE 01/26/2023 1531   KETONESUR NEGATIVE 01/26/2023 1531   PROTEINUR NEGATIVE 01/26/2023 1531   UROBILINOGEN 0.2 08/01/2008 1631   NITRITE NEGATIVE 01/26/2023 1531   LEUKOCYTESUR NEGATIVE 01/26/2023 1531    Radiological Exams on Admission: CT Angio Chest PE W and/or Wo Contrast  Result Date:  01/26/2023 CLINICAL DATA:  Intermittent left-sided rib pain for the past few weeks. EXAM: CT ANGIOGRAPHY CHEST CT ABDOMEN AND PELVIS WITH CONTRAST TECHNIQUE: Multidetector CT imaging of the chest was performed using the standard protocol during bolus administration of intravenous contrast. Multiplanar CT image reconstructions and MIPs were obtained to evaluate the vascular anatomy. Multidetector CT imaging of the abdomen and pelvis was performed using the standard protocol during bolus administration of intravenous contrast. RADIATION DOSE REDUCTION: This exam was performed according to the departmental dose-optimization program which includes automated exposure control, adjustment of the mA and/or kV according to patient size and/or use of iterative reconstruction technique. CONTRAST:  OMNIPAQUE IOHEXOL 350 MG/ML SOLN COMPARISON:  CT abdomen pelvis dated October 02, 2021. FINDINGS: CTA CHEST FINDINGS Cardiovascular: Satisfactory opacification of the pulmonary arteries to the segmental level. No evidence of pulmonary embolism. Mild cardiomegaly status post CABG and left atrial appendage clipping. Trace pericardial effusion. No thoracic aortic aneurysm or dissection. Coronary, aortic arch, and branch vessel atherosclerotic vascular disease. Mediastinum/Nodes: No enlarged mediastinal, hilar, or axillary lymph nodes. Thyroid gland, trachea, and esophagus demonstrate no significant findings. Lungs/Pleura: Ill-defined tiny centrilobular ground-glass densities throughout both lungs. Mild mosaic attenuation. No focal consolidation, pleural effusion, or pneumothorax. Musculoskeletal: No chest wall abnormality. No acute or significant osseous findings. Prior median sternotomy with nonunion. Review of the MIP images confirms the above findings. CT ABDOMEN PELVIS FINDINGS Hepatobiliary: No focal liver abnormality is seen. Status post cholecystectomy. No biliary dilatation. Pancreas: Unremarkable. No pancreatic ductal  dilatation or surrounding inflammatory changes. Spleen: Normal in size without focal abnormality. Adrenals/Urinary Tract: Unchanged 1.2 cm right and 1.8 cm left adrenal nodules previously characterized as adenomas. No follow-up imaging is recommended. No renal calculi, mass, or hydronephrosis. The bladder is unremarkable. Stomach/Bowel: Stomach is within normal limits. Appendix appears normal. No evidence of bowel wall thickening, distention, or inflammatory  changes. Mild sigmoid colonic diverticulosis. Vascular/Lymphatic: Aortic atherosclerosis. No enlarged abdominal or pelvic lymph nodes. Reproductive: Uterus and bilateral adnexa are unremarkable. Other: No abdominal wall hernia or abnormality. No abdominopelvic ascites. No pneumoperitoneum. Musculoskeletal: No acute or significant osseous findings. Review of the MIP images confirms the above findings. IMPRESSION: CT chest: 1. No evidence of pulmonary embolism. 2. Ill-defined tiny centrilobular ground-glass densities throughout both lungs with mild mosaic attenuation. Differential considerations include respiratory bronchiolitis and hypersensitivity pneumonitis. 3.  Aortic Atherosclerosis (ICD10-I70.0). CT abdomen pelvis: 1.  No acute intra-abdominal process. Electronically Signed   By: Obie Dredge M.D.   On: 01/26/2023 13:49   CT ABDOMEN PELVIS W CONTRAST  Result Date: 01/26/2023 CLINICAL DATA:  Intermittent left-sided rib pain for the past few weeks. EXAM: CT ANGIOGRAPHY CHEST CT ABDOMEN AND PELVIS WITH CONTRAST TECHNIQUE: Multidetector CT imaging of the chest was performed using the standard protocol during bolus administration of intravenous contrast. Multiplanar CT image reconstructions and MIPs were obtained to evaluate the vascular anatomy. Multidetector CT imaging of the abdomen and pelvis was performed using the standard protocol during bolus administration of intravenous contrast. RADIATION DOSE REDUCTION: This exam was performed according to the  departmental dose-optimization program which includes automated exposure control, adjustment of the mA and/or kV according to patient size and/or use of iterative reconstruction technique. CONTRAST:  OMNIPAQUE IOHEXOL 350 MG/ML SOLN COMPARISON:  CT abdomen pelvis dated October 02, 2021. FINDINGS: CTA CHEST FINDINGS Cardiovascular: Satisfactory opacification of the pulmonary arteries to the segmental level. No evidence of pulmonary embolism. Mild cardiomegaly status post CABG and left atrial appendage clipping. Trace pericardial effusion. No thoracic aortic aneurysm or dissection. Coronary, aortic arch, and branch vessel atherosclerotic vascular disease. Mediastinum/Nodes: No enlarged mediastinal, hilar, or axillary lymph nodes. Thyroid gland, trachea, and esophagus demonstrate no significant findings. Lungs/Pleura: Ill-defined tiny centrilobular ground-glass densities throughout both lungs. Mild mosaic attenuation. No focal consolidation, pleural effusion, or pneumothorax. Musculoskeletal: No chest wall abnormality. No acute or significant osseous findings. Prior median sternotomy with nonunion. Review of the MIP images confirms the above findings. CT ABDOMEN PELVIS FINDINGS Hepatobiliary: No focal liver abnormality is seen. Status post cholecystectomy. No biliary dilatation. Pancreas: Unremarkable. No pancreatic ductal dilatation or surrounding inflammatory changes. Spleen: Normal in size without focal abnormality. Adrenals/Urinary Tract: Unchanged 1.2 cm right and 1.8 cm left adrenal nodules previously characterized as adenomas. No follow-up imaging is recommended. No renal calculi, mass, or hydronephrosis. The bladder is unremarkable. Stomach/Bowel: Stomach is within normal limits. Appendix appears normal. No evidence of bowel wall thickening, distention, or inflammatory changes. Mild sigmoid colonic diverticulosis. Vascular/Lymphatic: Aortic atherosclerosis. No enlarged abdominal or pelvic lymph nodes.  Reproductive: Uterus and bilateral adnexa are unremarkable. Other: No abdominal wall hernia or abnormality. No abdominopelvic ascites. No pneumoperitoneum. Musculoskeletal: No acute or significant osseous findings. Review of the MIP images confirms the above findings. IMPRESSION: CT chest: 1. No evidence of pulmonary embolism. 2. Ill-defined tiny centrilobular ground-glass densities throughout both lungs with mild mosaic attenuation. Differential considerations include respiratory bronchiolitis and hypersensitivity pneumonitis. 3.  Aortic Atherosclerosis (ICD10-I70.0). CT abdomen pelvis: 1.  No acute intra-abdominal process. Electronically Signed   By: Obie Dredge M.D.   On: 01/26/2023 13:49   DG Chest Portable 1 View  Result Date: 01/26/2023 CLINICAL DATA:  Chest pain EXAM: PORTABLE CHEST 1 VIEW COMPARISON:  11/02/2022 FINDINGS: Cardiomegaly status post median sternotomy with left atrial appendage clip. Mild diffuse bilateral interstitial opacity. Osseous structures unremarkable. IMPRESSION: Cardiomegaly with mild diffuse bilateral interstitial opacity, consistent with  edema or atypical/viral infection. No focal airspace opacity. Electronically Signed   By: Jearld Lesch M.D.   On: 01/26/2023 12:42    EKG: Independently reviewed.   Assessment/Plan  Acute on Subacute Pleuritic type chest pain  with associated upper abdominal pain -admit to med tele  -CE negative  -CTPE no PE but noted ground glass changes, possible atypical pna vs chronic aspiration  -possible pleurisy in setting of atypical pna/chronic aspiration -possible gi origin- pud/gastritis with tender epigastrium on exam  -will start levaquin  -check crp/esr -speech and swallow evaluation -case discussed with GI by EDP not able to rule out Gi component , gi to see and leave final recs in am  -supportive multimodal  pain regimen   CAD s/p 3V CABG - 3/28  -cardiac work thus far negative  -EKG sinus tach ortherwise normal  -trop  negative  -echo to be complete -will resume GDMT as able   HLD -continue statin    Tobacco abuse -resume nicotine patch    ADHD -hold ADDERALL while in house   Atrial fibrillation  -s/p pulmonary vein isolation ablation and ligation of the left atrial appendage 10/08/22   Anxiety  -not currently on outpatient medication   GERD -on Carafate, PPI        DVT prophylaxis: heparin Code Status: full/ as discussed per patient wishes in event of cardiac arrest  Family Communication: none at bedside Disposition Plan: patient  expected to be admitted greater than 2 midnights  Consults called: GI [place] Admission status: med tele   Lurline Del MD [facility]  If 7PM-7AM, please contact night-coverage [email or web address] Password [email or web address]  01/26/2023, 7:12 PM

## 2023-01-26 NOTE — ED Notes (Signed)
[facility] on the floor, report and paperwork given, pt's belongings bagged and sent w [facility]

## 2023-01-26 NOTE — ED Notes (Signed)
Called lab to add on hepatic function panel and lipase to previously collected labwork.

## 2023-01-26 NOTE — ED Notes (Signed)
Patient transported to CT 

## 2023-01-26 NOTE — ED Notes (Addendum)
Patient requesting CD copy of scans and copies of the reports, called radiology, they will put together and deliver to this RN

## 2023-01-26 NOTE — ED Notes (Signed)
Report rec'd from prev RN 

## 2023-01-26 NOTE — ED Notes (Signed)
Pt requesting a staff mbr to go out to her car and "move something", security to the room with this RN, pt said she is not sure she needs it moved yet, will let security know if her dtr is coming to pick up her car

## 2023-01-27 ENCOUNTER — Observation Stay (HOSPITAL_COMMUNITY): Payer: BC Managed Care – PPO

## 2023-01-27 DIAGNOSIS — R1012 Left upper quadrant pain: Secondary | ICD-10-CM

## 2023-01-27 DIAGNOSIS — Z01810 Encounter for preprocedural cardiovascular examination: Secondary | ICD-10-CM

## 2023-01-27 DIAGNOSIS — R079 Chest pain, unspecified: Secondary | ICD-10-CM

## 2023-01-27 LAB — COMPREHENSIVE METABOLIC PANEL
ALT: 15 U/L (ref 0–44)
AST: 18 U/L (ref 15–41)
Albumin: 3.3 g/dL — ABNORMAL LOW (ref 3.5–5.0)
Alkaline Phosphatase: 134 U/L — ABNORMAL HIGH (ref 38–126)
Anion gap: 8 (ref 5–15)
BUN: 5 mg/dL — ABNORMAL LOW (ref 6–20)
CO2: 26 mmol/L (ref 22–32)
Calcium: 8.6 mg/dL — ABNORMAL LOW (ref 8.9–10.3)
Chloride: 100 mmol/L (ref 98–111)
Creatinine, Ser: 0.65 mg/dL (ref 0.44–1.00)
GFR, Estimated: >60 mL/min (ref >60)
Glucose, Bld: 103 mg/dL — ABNORMAL HIGH (ref 70–99)
Potassium: 4.1 mmol/L (ref 3.5–5.1)
Sodium: 134 mmol/L — ABNORMAL LOW (ref 135–145)
Total Bilirubin: 0.5 mg/dL (ref 0.3–1.2)
Total Protein: 6.2 g/dL — ABNORMAL LOW (ref 6.5–8.1)

## 2023-01-27 LAB — CBC
HCT: 37.8 % (ref 36.0–46.0)
Hemoglobin: 11.9 g/dL — ABNORMAL LOW (ref 12.0–15.0)
MCH: 28.9 pg (ref 26.0–34.0)
MCHC: 31.5 g/dL (ref 30.0–36.0)
MCV: 91.7 fL (ref 80.0–100.0)
Platelets: 291 10*3/uL (ref 150–400)
RBC: 4.12 MIL/uL (ref 3.87–5.11)
RDW: 14.5 % (ref 11.5–15.5)
WBC: 9.5 10*3/uL (ref 4.0–10.5)
nRBC: 0 % (ref 0.0–0.2)

## 2023-01-27 LAB — C-REACTIVE PROTEIN: CRP: 5.8 mg/dL — ABNORMAL HIGH (ref <1.0)

## 2023-01-27 LAB — LACTIC ACID, PLASMA: Lactic Acid, Venous: 2.1 mmol/L (ref 0.5–1.9)

## 2023-01-27 LAB — BRAIN NATRIURETIC PEPTIDE: B Natriuretic Peptide: 53.5 pg/mL (ref 0.0–100.0)

## 2023-01-27 LAB — STREP PNEUMONIAE URINARY ANTIGEN: Strep Pneumo Urinary Antigen: NEGATIVE

## 2023-01-27 LAB — HIV ANTIBODY (ROUTINE TESTING W REFLEX): HIV Screen 4th Generation wRfx: NONREACTIVE

## 2023-01-27 LAB — PROTIME-INR
INR: 1.1 (ref 0.8–1.2)
Prothrombin Time: 14.4 seconds (ref 11.4–15.2)

## 2023-01-27 MED ORDER — LACTATED RINGERS IV BOLUS
250.0000 mL | Freq: Once | INTRAVENOUS | Status: AC
  Administered 2023-01-27: 250 mL via INTRAVENOUS

## 2023-01-27 MED ORDER — METHOCARBAMOL 500 MG PO TABS
500.0000 mg | ORAL_TABLET | Freq: Three times a day (TID) | ORAL | Status: DC
  Administered 2023-01-27 (×2): 500 mg via ORAL
  Filled 2023-01-27 (×2): qty 1

## 2023-01-27 MED ORDER — MELATONIN 5 MG PO TABS
5.0000 mg | ORAL_TABLET | Freq: Once | ORAL | Status: AC
  Administered 2023-01-27: 5 mg via ORAL
  Filled 2023-01-27: qty 1

## 2023-01-27 MED ORDER — NICOTINE 21 MG/24HR TD PT24
21.0000 mg | MEDICATED_PATCH | Freq: Every day | TRANSDERMAL | Status: DC
  Administered 2023-01-27: 21 mg via TRANSDERMAL
  Filled 2023-01-27 (×2): qty 1

## 2023-01-27 MED ORDER — PREDNISONE 20 MG PO TABS
40.0000 mg | ORAL_TABLET | Freq: Every day | ORAL | Status: DC
  Administered 2023-01-27: 40 mg via ORAL
  Filled 2023-01-27: qty 2

## 2023-01-27 MED ORDER — DOXYCYCLINE HYCLATE 100 MG PO TABS
100.0000 mg | ORAL_TABLET | Freq: Two times a day (BID) | ORAL | Status: DC
  Administered 2023-01-27 (×2): 100 mg via ORAL
  Filled 2023-01-27 (×2): qty 1

## 2023-01-27 MED ORDER — MELATONIN 5 MG PO TABS
5.0000 mg | ORAL_TABLET | Freq: Every evening | ORAL | Status: DC | PRN
  Administered 2023-01-27: 5 mg via ORAL
  Filled 2023-01-27: qty 1

## 2023-01-27 NOTE — Progress Notes (Signed)
Date and time results received: 01/27/23  0122 am (use smartphrase ".now" to insert current time)  Test: Lactic acid Critical Value: Lactic acid 2.1  Name of Provider Notified: J.Daniels, NP  Orders Received? Or Actions Taken?:  LR 250 bolus.

## 2023-01-27 NOTE — Evaluation (Signed)
SLP Cancellation Note  Patient Details Name: [name] MRN: [id number] DOB: 11-Mar-1971   Cancelled treatment:       Reason Eval/Treat Not Completed: Other (comment) (Per notes and RN, pt to undergo endoscopy with GI 7/18. SLP will follow up next date following endoscopy as this will provide more information to pt's clinical picture.) Rolena Infante, MS [facility] SLP Acute Rehab Services Office [phone]   Chales Abrahams 01/27/2023, 3:17 PM

## 2023-01-27 NOTE — Progress Notes (Signed)
   01/27/23 1052  TOC Brief Assessment  Insurance and Status Reviewed  Patient has primary care physician Yes  Home environment has been reviewed Resides in an apartment  Prior level of function: Independent at baseline  Prior/Current Home Services No current home services  Social Determinants of Health Reivew SDOH reviewed no interventions necessary  Readmission risk has been reviewed Yes  Transition of care needs no transition of care needs at this time

## 2023-01-27 NOTE — Evaluation (Signed)
SLP Cancellation Note  Patient Details Name: [name] MRN: [id number] DOB: February 14, 1971   Cancelled treatment:       Reason Eval/Treat Not Completed: Other (comment) (per chart review of MD note, pt awaiting GI consult; will continue efforts)  Rolena Infante, MS [facility] SLP Acute Rehab Services Office [phone]  Chales Abrahams 01/27/2023, 9:48 AM

## 2023-01-27 NOTE — Consult Note (Signed)
[name] Gastroenterology Consult  Referring Provider: No ref. provider found Primary Care Physician:  Noberto Retort, MD Primary Gastroenterologist: [facility] - Dr. Bosie Clos  Reason for Consultation: epigastric and left upper quadrant abdominal pain  SUBJECTIVE:   HPI: Janet TERMINE is a 52 y.o. female with past medical history significant for coronary artery disease status post triple-vessel CABG in March 2024, atrial fibrillation, gastroesophageal reflux disease, rheumatoid arthritis and tobacco use.  Presented to hospital on 01/26/2023 with chief complaint of constant left upper quadrant and epigastric pain.  She noted having the symptoms intermittently over the past few weeks though when they became constant she sought medical attention.  She had attempted omeprazole with no relief.  She denied use of NSAIDs, aspirin.  She has history of alternating bowel habits including constipation and diarrhea, no blood per rectum.  No significant nausea or vomiting.  Chest discomfort radiates to left shoulder.  No shortness of breath.  No prior EGD.  She noted having colonoscopy roughly few years prior at outside facility, no record available for my review.  Noted that she has been experiencing dysphagia to solids/liquids/pills intermittently.  She has been seen in office by Dr. Bosie Clos for symptoms of dysphagia and was recommended to have barium esophagram, this was completed on 03/20/2010 and showed nonspecific esophageal motility disorder, otherwise unremarkable.  On presentation labs showed lipase 30, alkaline phosphatase 129, AST/ALT 13/11, total bilirubin 0.3, WBC 11.3, hemoglobin 13.5, platelet 368.  CT scan of abdomen pelvis showed no focal liver abnormality, unremarkable pancreas, stomach within normal limits, no bowel wall thickening, mild sigmoid diverticulosis.  Past Medical History:  Diagnosis Date   A-fib (HCC)    Anxiety    Arthralgia    Asthma    Attention-deficit hyperactivity  disorder, predominantly inattentive type    Back pain    Dysrhythmia    Afib   Fear of flying    Fibromyalgia    Gastroesophageal reflux disease    HPV in female    Joint pain    Localized edema    Neck pain    OA (osteoarthritis)    Papillomavirus as the cause of diseases classified elsewhere    Primary insomnia    Pure hypercholesterolemia    Rheumatoid arthritis, seronegative, hand, unspecified laterality (HCC)    patient feels it is back related, not a true arthritis   Spondylolisthesis of lumbosacral region    Tobacco dependence    Unspecified inflammatory spondylopathy, cervical region (HCC)    Vitamin B12 deficiency anemia    Voice hoarseness    Past Surgical History:  Procedure Laterality Date   ABDOMINAL EXPOSURE N/A 06/28/2020   Procedure: ABDOMINAL EXPOSURE;  Surgeon: Cephus Shelling, MD;  Location: [facility] OR;  Service: Vascular;  Laterality: N/A;  anterior   ANTERIOR LUMBAR FUSION N/A 06/28/2020   Procedure: ANTERIOR LUMBAR INTERBODY FUSION LUMBAR FIVE-SACRAL ONE.;  Surgeon: Tia Alert, MD;  Location: [facility] OR;  Service: Neurosurgery;  Laterality: N/A;  anterior approach   BREAST ENHANCEMENT SURGERY     CARPAL TUNNEL RELEASE Left    CARPAL TUNNEL RELEASE Right 06/28/2020   Procedure: CARPAL TUNNEL RELEASE;  Surgeon: Tia Alert, MD;  Location: [facility] OR;  Service: Neurosurgery;  Laterality: Right;   CERVICAL SPINE SURGERY     CHOLECYSTECTOMY     CLIPPING OF ATRIAL APPENDAGE N/A 10/08/2022   Procedure: CLIPPING OF ATRIAL APPENDAGE USING ATRICURE ATRICLIP PRO2 SIZE 35;  Surgeon: Alleen Borne, MD;  Location: [facility] OR;  Service:  Open Heart Surgery;  Laterality: N/A;   CORONARY ARTERY BYPASS GRAFT N/A 10/08/2022   Procedure: CORONARY ARTERY BYPASS GRAFTING (CABG) X3 USING LEFT INTERNAL MAMMARY ARTERY AND RIGHT GREATER SAPHENOUS VEIN HARVESTED ENDOSCOPICALLY;  Surgeon: Alleen Borne, MD;  Location: [facility] OR;  Service: Open Heart Surgery;  Laterality: N/A;   CORONARY  PRESSURE/FFR STUDY N/A 09/07/2022   Procedure: INTRAVASCULAR PRESSURE WIRE/FFR STUDY;  Surgeon: Yvonne Kendall, MD;  Location: [facility] INVASIVE CV LAB;  Service: Cardiovascular;  Laterality: N/A;   FOOT SURGERY     MANDIBLE SURGERY     MAZE N/A 10/08/2022   Procedure: PULMONARY VEIN ABLATION USING ATRICURE ISOLATOR SYNERGY ENCOMPASS OLH;  Surgeon: Alleen Borne, MD;  Location: [facility] OR;  Service: Open Heart Surgery;  Laterality: N/A;   RIGHT/LEFT HEART CATH AND CORONARY ANGIOGRAPHY N/A 09/07/2022   Procedure: RIGHT/LEFT HEART CATH AND CORONARY ANGIOGRAPHY;  Surgeon: Yvonne Kendall, MD;  Location: [facility] INVASIVE CV LAB;  Service: Cardiovascular;  Laterality: N/A;   TEE WITHOUT CARDIOVERSION N/A 10/08/2022   Procedure: TRANSESOPHAGEAL ECHOCARDIOGRAM;  Surgeon: Alleen Borne, MD;  Location: [facility] OR;  Service: Open Heart Surgery;  Laterality: N/A;   TONSILLECTOMY     TUBAL LIGATION     with ablation   TYMPANOSTOMY TUBE PLACEMENT     Prior to Admission medications   Medication Sig Start Date End Date Taking? Authorizing Provider  albuterol (VENTOLIN HFA) 108 (90 Base) MCG/ACT inhaler Inhale 2 puffs into the lungs every 4 (four) hours as needed for wheezing or shortness of breath. 10/15/22  Yes Roddenberry, Cecille Amsterdam, PA-C  amphetamine-dextroamphetamine (ADDERALL XR) 30 MG 24 hr capsule Take 60 mg by mouth daily.   Yes [provider]  Ascorbic Acid (VITAMIN C) 500 MG CAPS Take 1 capsule by mouth daily.   Yes [provider]  atorvastatin (LIPITOR) 80 MG tablet Take 1 tablet (80 mg total) by mouth daily. 09/09/22  Yes Conte, Tessa N, PA-C  cyanocobalamin (VITAMIN B12) 1000 MCG tablet Take 1,000 mcg by mouth daily.   Yes [provider]  escitalopram (LEXAPRO) 10 MG tablet Take 10 mg by mouth daily as needed (anxiety).   Yes [provider]  ferrous sulfate 325 (65 FE) MG EC tablet Take 325 mg by mouth daily.   Yes [provider]  furosemide (LASIX) 20 MG tablet  Take 1 tablet (20 mg total) by mouth daily as needed (for lower extremity edema). 11/02/22  Yes Conte, Tessa N, PA-C  gabapentin (NEURONTIN) 100 MG capsule Take 100-200 mg by mouth 4 (four) times daily as needed (pain).   Yes [provider]  gabapentin (NEURONTIN) 300 MG capsule Take 600 mg by mouth daily as needed (pain).   Yes [provider]  metoprolol succinate (TOPROL-XL) 25 MG 24 hr tablet Take 1 tablet (25 mg total) by mouth in the morning and at bedtime. 09/25/22  Yes Kathleene Hazel, MD  nicotine (NICODERM CQ - DOSED IN MG/24 HOURS) 21 mg/24hr patch Place 21 mg onto the skin daily.   Yes [provider]  nitroGLYCERIN (NITROSTAT) 0.4 MG SL tablet Place 1 tablet (0.4 mg total) under the tongue every 5 (five) minutes as needed for chest pain. 07/24/22 01/26/23 Yes Conte, Tessa N, PA-C  omeprazole (PRILOSEC) 20 MG capsule Take 40 mg by mouth daily.   Yes [provider]  VITAMIN D, CHOLECALCIFEROL, PO Take 1 tablet by mouth daily.   Yes [provider]  zolpidem (AMBIEN) 10 MG tablet Take 10 mg  by mouth at bedtime.   Yes [provider]   Current Facility-Administered Medications  Medication Dose Route Frequency Provider Last Rate Last Admin   0.9 %  sodium chloride infusion   Intravenous Continuous Lurline Del, MD 75 mL/hr at 01/26/23 2309 New Bag at 01/26/23 2309   albuterol (PROVENTIL) (2.5 MG/3ML) 0.083% nebulizer solution 2.5 mg  2.5 mg Nebulization Q2H PRN Lurline Del, MD       ascorbic acid (VITAMIN C) tablet 500 mg  500 mg Oral Daily Skip Mayer A, MD   500 mg at 01/27/23 0823   atorvastatin (LIPITOR) tablet 80 mg  80 mg Oral Daily Lurline Del, MD   80 mg at 01/27/23 3664   cyanocobalamin (VITAMIN B12) tablet 1,000 mcg  1,000 mcg Oral Daily Skip Mayer A, MD   1,000 mcg at 01/27/23 4034   doxycycline (VIBRA-TABS) tablet 100 mg  100 mg Oral Q12H Meredeth Ide, MD       ferrous sulfate tablet  325 mg  325 mg Oral Daily Skip Mayer A, MD   325 mg at 01/27/23 0820   heparin injection 5,000 Units  5,000 Units Subcutaneous Q8H Skip Mayer A, MD   5,000 Units at 01/27/23 7425   HYDROmorphone (DILAUDID) injection 0.5 mg  0.5 mg Intravenous Q3H PRN Skip Mayer A, MD   0.5 mg at 01/26/23 2035   levofloxacin (LEVAQUIN) IVPB 750 mg  750 mg Intravenous Q24H Skip Mayer A, MD 100 mL/hr at 01/27/23 0157 750 mg at 01/27/23 0157   lip balm (CARMEX) ointment   Topical PRN Lurline Del, MD   1 Application at 01/26/23 2310   methocarbamol (ROBAXIN) tablet 500 mg  500 mg Oral TID Meredeth Ide, MD       metoprolol succinate (TOPROL-XL) 24 hr tablet 25 mg  25 mg Oral Daily Skip Mayer A, MD   25 mg at 01/27/23 9563   nicotine (NICODERM CQ - dosed in mg/24 hours) patch 21 mg  21 mg Transdermal Once Ernie Avena, MD   21 mg at 01/26/23 1203   nitroGLYCERIN (NITROSTAT) SL tablet 0.4 mg  0.4 mg Sublingual Q5 min PRN Lurline Del, MD       ondansetron [facility]) tablet 4 mg  4 mg Oral Q6H PRN Lurline Del, MD       Or   ondansetron [facility]) injection 4 mg  4 mg Intravenous Q6H PRN Skip Mayer A, MD       oxyCODONE (Oxy IR/ROXICODONE) immediate release tablet 5 mg  5 mg Oral Q4H PRN Skip Mayer A, MD   5 mg at 01/27/23 8756   pantoprazole (PROTONIX) injection 40 mg  40 mg Intravenous Q12H Liliane Shi H, DO   40 mg at 01/27/23 4332   predniSONE (DELTASONE) tablet 40 mg  40 mg Oral Q breakfast Meredeth Ide, MD       pregabalin (LYRICA) capsule 75 mg  75 mg Oral TID PRN Lurline Del, MD   75 mg at 01/26/23 2144   sucralfate (CARAFATE) 1 GM/10ML suspension 1 g  1 g Oral TID WC & HS Liliane Shi H, DO   1 g at 01/27/23 9518   Allergies as of 01/26/2023 - Review Complete 01/26/2023  Allergen Reaction Noted   Zoloft [sertraline] Other (See Comments) 07/17/2019   Family History  Problem Relation Age of Onset   Hypertension Father     Heart disease Father    Social History  Socioeconomic History   Marital status: Married    Spouse name: Not on file   Number of children: 2   Years of education: Not on file   Highest education level: Not on file  Occupational History   Occupation: Works at home  Tobacco Use   Smoking status: Every Day    Current packs/day: 1.00    Types: Cigarettes   Smokeless tobacco: Never  Vaping Use   Vaping status: Never Used  Substance and Sexual Activity   Alcohol use: Yes    Comment: occasional   Drug use: Never   Sexual activity: Not on file  Other Topics Concern   Not on file  Social History Narrative   Not on file   Social Determinants of Health   Financial Resource Strain: Not on file  Food Insecurity: No Food Insecurity (10/08/2022)   Hunger Vital Sign    Worried About Running Out of Food in the Last Year: Never true    Ran Out of Food in the Last Year: Never true  Transportation Needs: No Transportation Needs (10/08/2022)   PRAPARE - Administrator, Civil Service (Medical): No    Lack of Transportation (Non-Medical): No  Physical Activity: Not on file  Stress: Not on file  Social Connections: Unknown (09/10/2022)   Received from [facility]   Social Network    Social Network: Not on file  Intimate Partner Violence: Not At Risk (10/08/2022)   Humiliation, Afraid, Rape, and Kick questionnaire    Fear of Current or Ex-Partner: No    Emotionally Abused: No    Physically Abused: No    Sexually Abused: No   Review of Systems:  Review of Systems  Respiratory:  Negative for shortness of breath.   Cardiovascular:  Positive for chest pain.  Gastrointestinal:  Positive for abdominal pain, constipation and diarrhea. Negative for blood in stool, nausea and vomiting.    OBJECTIVE:   Temp:  [98.2 F (36.8 C)-99 F (37.2 C)] 98.2 F (36.8 C) (07/17 0931) Pulse Rate:  [97-110] 99 (07/17 0931) Resp:  [14-22] 14 (07/17 0931) BP: (114-142)/(59-94) 124/87 (07/17  0931) SpO2:  [94 %-98 %] 95 % (07/17 0931) Last BM Date : 01/26/23 Physical Exam Constitutional:      General: She is not in acute distress.    Appearance: She is not ill-appearing, toxic-appearing or diaphoretic.  Cardiovascular:     Rate and Rhythm: Normal rate and regular rhythm.  Pulmonary:     Effort: No respiratory distress.     Breath sounds: Normal breath sounds.  Abdominal:     General: Bowel sounds are normal. There is no distension.     Palpations: Abdomen is soft.     Tenderness: There is abdominal tenderness. There is no guarding.  Musculoskeletal:     Right lower leg: No edema.     Left lower leg: No edema.  Skin:    General: Skin is warm and dry.  Neurological:     Mental Status: She is alert.     Labs: Recent Labs    01/26/23 1106 01/26/23 2108 01/27/23 0051  WBC 11.3* 10.2 9.5  HGB 13.5 12.6 11.9*  HCT 41.2 39.3 37.8  PLT 368 315 291   BMET Recent Labs    01/26/23 1106 01/26/23 2108 01/27/23 0051  NA 138  --  134*  K 4.2  --  4.1  CL 102  --  100  CO2 28  --  26  GLUCOSE 102*  --  103*  BUN 8  --  5*  CREATININE 0.63 0.64 0.65  CALCIUM 9.1  --  8.6*   LFT Recent Labs    01/26/23 1124 01/27/23 0051  PROT 7.3 6.2*  ALBUMIN 4.2 3.3*  AST 13* 18  ALT 11 15  ALKPHOS 129* 134*  BILITOT 0.3 0.5  BILIDIR 0.1  --   IBILI 0.2*  --    PT/INR Recent Labs    01/27/23 0051  LABPROT 14.4  INR 1.1    Diagnostic imaging: CT Angio Chest PE W and/or Wo Contrast  Result Date: 01/26/2023 CLINICAL DATA:  Intermittent left-sided rib pain for the past few weeks. EXAM: CT ANGIOGRAPHY CHEST CT ABDOMEN AND PELVIS WITH CONTRAST TECHNIQUE: Multidetector CT imaging of the chest was performed using the standard protocol during bolus administration of intravenous contrast. Multiplanar CT image reconstructions and MIPs were obtained to evaluate the vascular anatomy. Multidetector CT imaging of the abdomen and pelvis was performed using the standard protocol  during bolus administration of intravenous contrast. RADIATION DOSE REDUCTION: This exam was performed according to the departmental dose-optimization program which includes automated exposure control, adjustment of the mA and/or kV according to patient size and/or use of iterative reconstruction technique. CONTRAST:  OMNIPAQUE IOHEXOL 350 MG/ML SOLN COMPARISON:  CT abdomen pelvis dated October 02, 2021. FINDINGS: CTA CHEST FINDINGS Cardiovascular: Satisfactory opacification of the pulmonary arteries to the segmental level. No evidence of pulmonary embolism. Mild cardiomegaly status post CABG and left atrial appendage clipping. Trace pericardial effusion. No thoracic aortic aneurysm or dissection. Coronary, aortic arch, and branch vessel atherosclerotic vascular disease. Mediastinum/Nodes: No enlarged mediastinal, hilar, or axillary lymph nodes. Thyroid gland, trachea, and esophagus demonstrate no significant findings. Lungs/Pleura: Ill-defined tiny centrilobular ground-glass densities throughout both lungs. Mild mosaic attenuation. No focal consolidation, pleural effusion, or pneumothorax. Musculoskeletal: No chest wall abnormality. No acute or significant osseous findings. Prior median sternotomy with nonunion. Review of the MIP images confirms the above findings. CT ABDOMEN PELVIS FINDINGS Hepatobiliary: No focal liver abnormality is seen. Status post cholecystectomy. No biliary dilatation. Pancreas: Unremarkable. No pancreatic ductal dilatation or surrounding inflammatory changes. Spleen: Normal in size without focal abnormality. Adrenals/Urinary Tract: Unchanged 1.2 cm right and 1.8 cm left adrenal nodules previously characterized as adenomas. No follow-up imaging is recommended. No renal calculi, mass, or hydronephrosis. The bladder is unremarkable. Stomach/Bowel: Stomach is within normal limits. Appendix appears normal. No evidence of bowel wall thickening, distention, or inflammatory changes. Mild  sigmoid colonic diverticulosis. Vascular/Lymphatic: Aortic atherosclerosis. No enlarged abdominal or pelvic lymph nodes. Reproductive: Uterus and bilateral adnexa are unremarkable. Other: No abdominal wall hernia or abnormality. No abdominopelvic ascites. No pneumoperitoneum. Musculoskeletal: No acute or significant osseous findings. Review of the MIP images confirms the above findings. IMPRESSION: CT chest: 1. No evidence of pulmonary embolism. 2. Ill-defined tiny centrilobular ground-glass densities throughout both lungs with mild mosaic attenuation. Differential considerations include respiratory bronchiolitis and hypersensitivity pneumonitis. 3.  Aortic Atherosclerosis (ICD10-I70.0). CT abdomen pelvis: 1.  No acute intra-abdominal process. Electronically Signed   By: Obie Dredge M.D.   On: 01/26/2023 13:49   CT ABDOMEN PELVIS W CONTRAST  Result Date: 01/26/2023 CLINICAL DATA:  Intermittent left-sided rib pain for the past few weeks. EXAM: CT ANGIOGRAPHY CHEST CT ABDOMEN AND PELVIS WITH CONTRAST TECHNIQUE: Multidetector CT imaging of the chest was performed using the standard protocol during bolus administration of intravenous contrast. Multiplanar CT image reconstructions and MIPs were obtained to evaluate the vascular anatomy. Multidetector CT imaging of the abdomen  and pelvis was performed using the standard protocol during bolus administration of intravenous contrast. RADIATION DOSE REDUCTION: This exam was performed according to the departmental dose-optimization program which includes automated exposure control, adjustment of the mA and/or kV according to patient size and/or use of iterative reconstruction technique. CONTRAST:  OMNIPAQUE IOHEXOL 350 MG/ML SOLN COMPARISON:  CT abdomen pelvis dated October 02, 2021. FINDINGS: CTA CHEST FINDINGS Cardiovascular: Satisfactory opacification of the pulmonary arteries to the segmental level. No evidence of pulmonary embolism. Mild cardiomegaly status  post CABG and left atrial appendage clipping. Trace pericardial effusion. No thoracic aortic aneurysm or dissection. Coronary, aortic arch, and branch vessel atherosclerotic vascular disease. Mediastinum/Nodes: No enlarged mediastinal, hilar, or axillary lymph nodes. Thyroid gland, trachea, and esophagus demonstrate no significant findings. Lungs/Pleura: Ill-defined tiny centrilobular ground-glass densities throughout both lungs. Mild mosaic attenuation. No focal consolidation, pleural effusion, or pneumothorax. Musculoskeletal: No chest wall abnormality. No acute or significant osseous findings. Prior median sternotomy with nonunion. Review of the MIP images confirms the above findings. CT ABDOMEN PELVIS FINDINGS Hepatobiliary: No focal liver abnormality is seen. Status post cholecystectomy. No biliary dilatation. Pancreas: Unremarkable. No pancreatic ductal dilatation or surrounding inflammatory changes. Spleen: Normal in size without focal abnormality. Adrenals/Urinary Tract: Unchanged 1.2 cm right and 1.8 cm left adrenal nodules previously characterized as adenomas. No follow-up imaging is recommended. No renal calculi, mass, or hydronephrosis. The bladder is unremarkable. Stomach/Bowel: Stomach is within normal limits. Appendix appears normal. No evidence of bowel wall thickening, distention, or inflammatory changes. Mild sigmoid colonic diverticulosis. Vascular/Lymphatic: Aortic atherosclerosis. No enlarged abdominal or pelvic lymph nodes. Reproductive: Uterus and bilateral adnexa are unremarkable. Other: No abdominal wall hernia or abnormality. No abdominopelvic ascites. No pneumoperitoneum. Musculoskeletal: No acute or significant osseous findings. Review of the MIP images confirms the above findings. IMPRESSION: CT chest: 1. No evidence of pulmonary embolism. 2. Ill-defined tiny centrilobular ground-glass densities throughout both lungs with mild mosaic attenuation. Differential considerations include  respiratory bronchiolitis and hypersensitivity pneumonitis. 3.  Aortic Atherosclerosis (ICD10-I70.0). CT abdomen pelvis: 1.  No acute intra-abdominal process. Electronically Signed   By: Obie Dredge M.D.   On: 01/26/2023 13:49   DG Chest Portable 1 View  Result Date: 01/26/2023 CLINICAL DATA:  Chest pain EXAM: PORTABLE CHEST 1 VIEW COMPARISON:  11/02/2022 FINDINGS: Cardiomegaly status post median sternotomy with left atrial appendage clip. Mild diffuse bilateral interstitial opacity. Osseous structures unremarkable. IMPRESSION: Cardiomegaly with mild diffuse bilateral interstitial opacity, consistent with edema or atypical/viral infection. No focal airspace opacity. Electronically Signed   By: Jearld Lesch M.D.   On: 01/26/2023 12:42    IMPRESSION: Left upper quadrant abdominal pain Epigastric abdominal pain Atypical chest pain Coronary artery disease status post triple-vessel CABG in March 2024 Atrial fibrillation Dysphagia, chronic Tobacco use Rheumatoid arthritis  PLAN: -Continue pantoprazole -Continue sucralfate -Consider EGD to further evaluate significant epigastric and left upper quadrant pain, request formal cardiac evaluation given triple-vessel CABG in March 2024 -Okay for diet today from GI perspective -If cardiology evaluates and no further cardiac workup, can plan for EGD on 01/28/2023 -Discussed EGD procedure in detail with patient including benefits, alternatives, risks of bleeding/infection/perforation/missed lesion/anesthesia, she verbalized understanding and elected to proceed if appropriate -[facility] will follow   LOS: 0 days   Liliane Shi, [facility] Gastroenterology

## 2023-01-27 NOTE — Progress Notes (Addendum)
[facility]  PROGRESS NOTE  [name] WUJ:[id number] DOB: 1971/01/08 DOA: 01/26/2023 PCP: Noberto Retort, MD   Brief HPI:   52 year old female with medical history of CAD s/p CABG x 3, pulmonary vein isolation ablation and ligation of left atrial appendage on 09/18/2022, atrial fibrillation, ischemic cardiomyopathy, anxiety, fibromyalgia, GERD, arthritis, diverticulitis/colitis, tobacco abuse came to med [facility] with complaint of left upper quadrant abdominal pain with radiation to her chest.  Patient said that pain started about 2 to 3 weeks ago, worse on deep breathing. In the ED CTA chest as well as CT abdomen/pelvis was unremarkable, showed ill-defined tiny centrilobular groundglass densities throughout both lungs and mild mosaic attenuation with differential considerations including hypersensitivity pneumonitis, respiratory bronchiolitis.  Did not show any acute intra-abdominal process.    Assessment/Plan:   Left shoulder pain -Neer's sign positive; concern for rotator cuff impingement syndrome -Obtain x-ray of left shoulder -Start prednisone 40 mg daily, Robaxin 500 mg 3 times daily  Left upper quadrant pain -Appreciate GI recommendations -Plan for EGD in a.m. -Continue Protonix 40 mg IV twice daily   Atypical pneumonia -Continue Levaquin -Follow blood culture results  CAD s/p three-vessel CABG -Cardiology consulted for preop evaluation -Troponin x 2 were flat -Continue atorvastatin, metoprolol  Hyperlipidemia -Continue statin  Tobacco abuse - continue nicotine patch  ADHD -hold ADDERALL while in house    Atrial fibrillation  -s/p pulmonary vein isolation ablation and ligation of the left atrial appendage 10/08/22     Anxiety  -not currently on outpatient medication    GERD -on Carafate, PPI    Medications     ascorbic acid  500 mg Oral Daily   atorvastatin  80 mg Oral Daily   cyanocobalamin  1,000 mcg Oral Daily   ferrous sulfate   325 mg Oral Daily   heparin  5,000 Units Subcutaneous Q8H   metoprolol succinate  25 mg Oral Daily   nicotine  21 mg Transdermal Once   pantoprazole (PROTONIX) IV  40 mg Intravenous Q12H   sucralfate  1 g Oral TID WC & HS     Data Reviewed:   CBG:  Recent Labs  Lab 01/26/23 1102  GLUCAP 101*    SpO2: 94 %    Vitals:   01/26/23 1851 01/26/23 2036 01/26/23 2107 01/27/23 0209  BP: 121/88  122/66 114/71  Pulse: (!) 105  (!) 110 97  Resp: 20  18 18   Temp: 99 F (37.2 C)  99 F (37.2 C) 98.3 F (36.8 C)  TempSrc: Oral  Oral Oral  SpO2: 96%  96% 94%  Height:  5\' 2"  (1.575 m)        Data Reviewed:  Basic Metabolic Panel: Recent Labs  Lab 01/26/23 1106 01/26/23 2108 01/27/23 0051  NA 138  --  134*  K 4.2  --  4.1  CL 102  --  100  CO2 28  --  26  GLUCOSE 102*  --  103*  BUN 8  --  5*  CREATININE 0.63 0.64 0.65  CALCIUM 9.1  --  8.6*    CBC: Recent Labs  Lab 01/26/23 1106 01/26/23 2108 01/27/23 0051  WBC 11.3* 10.2 9.5  HGB 13.5 12.6 11.9*  HCT 41.2 39.3 37.8  MCV 86.7 88.7 91.7  PLT 368 315 291    LFT Recent Labs  Lab 01/26/23 1124 01/27/23 0051  AST 13* 18  ALT 11 15  ALKPHOS 129* 134*  BILITOT 0.3 0.5  PROT 7.3 6.2*  ALBUMIN 4.2 3.3*     Antibiotics: Anti-infectives (From admission, onward)    Start     Dose/Rate Route Frequency Ordered Stop   01/27/23 0030  levofloxacin (LEVAQUIN) IVPB 750 mg        750 mg 100 mL/hr over 90 Minutes Intravenous Every 24 hours 01/26/23 2336          DVT prophylaxis: Heparin  Code Status: Full code  Family Communication:    CONSULTS    Subjective   Denies pain at this time.  Got pain medication earlier today.   Objective    Physical Examination:   General: Appears in no acute distress Cardiovascular: S1-S2, regular, no murmur auscultated Respiratory: Lungs clear to auscultation bilaterally Abdomen: Soft, nontender, no organomegaly Extremities: No edema in the lower  extremities Neurologic: Alert, vented x 3, no focal deficit noted Musculoskeletal-Neer's sign positive on the left shoulder   Status is: Inpatient:             Meredeth Ide   [facility] If 7PM-7AM, please contact night-coverage at [email or web address], Office  [phone]   01/27/2023, 8:58 AM  LOS: 0 days

## 2023-01-27 NOTE — Consult Note (Addendum)
Cardiology Consultation   Patient ID: [name] MRN: [id number]; DOB: 01-20-1971  Admit date: 01/26/2023 Date of Consult: 01/27/2023  PCP:  Noberto Retort, MD   [facility] Providers Cardiologist:  Verne Carrow, MD   {  Patient Profile:   [name] is a 52 y.o. female with a history of CAD s/p CABG x3 (LIMA-LAD, SVG-Diag, SVG-OM1) in 09/2022, ischemic cardiomyopathy with EF of 35-40% on Echo in 07/2022 but improved to 55-60% on repeat Echo in 12/2022, palpitations with short runs of both NSVT and SVT as well as rare PACs/ PVCs noted on monitor in 07/2022, hyperlipidemia, GERD, rheumatoid arthritis, fibromyalgia, and anxiety who is being seen 01/27/2023 for pre-op evaluation at the request of Dr. Lorenso Quarry (GI).  History of Present Illness:   [name] is a 52 year old female with the above history who is followed by Dr. Clifton James.  She was seen by Franco Collet, PA-C, in 06/2022 after not having been seen for several years with reports of palpitations.  She reported to the time that she had an EKG done at if fire department recently and was told she had atrial fibrillation do not have any documentation of this.  She was in sinus rhythm at the time of that visit.  Echo and ZIO monitor were ordered for further evaluation.  Monitor showed 4 short runs of nonsustained VT (longest run 13 beats), 3 short runs of SVT (longest run 7 beats) and rare PACs/PVCs.  He was started on Eliquis at that time given concern for atrial fibrillation.  Echo showed LVEF of 35-40% with mild global hypokinesis with moderate hypokinesis of the septal wall as well as mild to moderate mitral regurgitation.  Therefore, a coronary CTA was ordered and showed a coronary calcium score of 909 (90th percentile for age and sex) with mild to moderate multivessel CAD.  FFR demonstrated a flow-limiting stenosis of the D2 branch of the LAD.  This led to a right/left cardiac catheterization in 08/2022 which  showed multivessel CAD with most significant disease involving the mid LAD where a second sequential 90% and 70% lesions flanked an aneurysmal segment where a large D2 branch arises from.  D2 also contained a 70% proximal stenosis.  She was referred to CT surgery as an outpatient and CABG was recommended.  She underwent CABG x 3 with LIMA to LAD, SVG to the diagonal, and SVG to OM1 on 10/08/2022 with Dr. Laneta Simmers.  She also underwent pulmonary vein isolation ablation and ligation of the left atrial appendage at the time of her surgery.  She did require 1 unit of packed red blood cells for acute blood loss anemia following surgery.  Persistent hypoxia following surgery despite diuresis for expected postop volume overload and was discharged on home O2.  Eliquis was stopped during admission given acute anemia and was not resumed at discharge. She was also discharged on Midodrine given low BP. She was last seen by Dr. Clifton James on 11/16/2022 at which time she was recovering well.  She reported rare palpitations. Plan was also to hold off on restarting Eliquis unless she had documented recurrence of atrial fibrillation.  Repeat echo was ordered and showed normalization of EF to 55-60% with moderate hypokinesis of the basal mid septal wall and grade 1 diastolic dysfunction.  Presented to the [facility] ED on 01/26/2023 further evaluation of left upper quadrant pain radiating to her chest. EKG showed sinus tachycardia, rate 106 bpm, with T wave versions in leads aVL and V2.  High-sensitivity troponin negative x 2.  Chest x-ray showed cardiomegaly with mild diffuse bilateral interstitial opacities consistent with edema or atypical/viral infection.  Chest CTA was negative for PE but did show ill-defined tiny Centura lobular groundglass densities throughout both lungs with mild mosaic attenuation.  Differential includes respiratory bronchiolitis and hypersensitivity pneumonitis.  Abdominal/pelvic CT showed no acute  intra-abdominal processes. WBC 11.3, Plts 13.5, Plts 368. Na 138, K 4.2, Glucose 102, BUN 8, Cr 0.63. AST 13, ALT 11, Alk Phos 129, Total Bili 0.3, Direct Bili 0.1, Indirect Bili 0.2.  Urine pregnancy negative.  UA negative.  Psych acid normal.  Drug screen positive for amphetamines but she takes Adderall at home.  She was admitted for atypical pneumonia and possible gastritis and started on Levaquin.  GI was consulted and recommended EGD for further evaluation of significant epigastric and left upper quadrant pain.  Cardiology consulted for preop evaluation.  Patient reports left upper quadrant pain that started about 2 weeks ago. It started as a very mild pain but has worsened since then. She reports severe constant pain over the last day or so. Pain will radiated up her left side if she lays on her left side and she describes a cramping sensation in her left upper chest/ axillary area. No other chest pain. No nausea, vomiting, or diarrhea. She reports some dyspnea on exertion since her CABG as well as fatigue which I suspect is due to deconditioning. She has never completed cardiac rehab. No orthopnea or PND. No PND. She describes one episode of palpitations (fluttering) when she was having significant abdominal pain. She checked her vital signs at that time and heart rates was elevated in the 120s. She denies any lightheadedness, dizziness, or syncope.  She has not been very active since her CABG but states she is able to walk pretty good distances. She does reports having to stop when going up an incline/ stairs due to fatigue and shortness of breath which I again suspect is due to deconditioning.  Past Medical History:  Diagnosis Date   A-fib (HCC)    Anxiety    Arthralgia    Asthma    Attention-deficit hyperactivity disorder, predominantly inattentive type    Back pain    Dysrhythmia    Afib   Fear of flying    Fibromyalgia    Gastroesophageal reflux disease    HPV in female    Joint pain     Localized edema    Neck pain    OA (osteoarthritis)    Papillomavirus as the cause of diseases classified elsewhere    Primary insomnia    Pure hypercholesterolemia    Rheumatoid arthritis, seronegative, hand, unspecified laterality (HCC)    patient feels it is back related, not a true arthritis   Spondylolisthesis of lumbosacral region    Tobacco dependence    Unspecified inflammatory spondylopathy, cervical region (HCC)    Vitamin B12 deficiency anemia    Voice hoarseness     Past Surgical History:  Procedure Laterality Date   ABDOMINAL EXPOSURE N/A 06/28/2020   Procedure: ABDOMINAL EXPOSURE;  Surgeon: Cephus Shelling, MD;  Location: [facility] OR;  Service: Vascular;  Laterality: N/A;  anterior   ANTERIOR LUMBAR FUSION N/A 06/28/2020   Procedure: ANTERIOR LUMBAR INTERBODY FUSION LUMBAR FIVE-SACRAL ONE.;  Surgeon: Tia Alert, MD;  Location: [facility] OR;  Service: Neurosurgery;  Laterality: N/A;  anterior approach   BREAST ENHANCEMENT SURGERY     CARPAL TUNNEL RELEASE Left    CARPAL TUNNEL  RELEASE Right 06/28/2020   Procedure: CARPAL TUNNEL RELEASE;  Surgeon: Tia Alert, MD;  Location: [facility] OR;  Service: Neurosurgery;  Laterality: Right;   CERVICAL SPINE SURGERY     CHOLECYSTECTOMY     CLIPPING OF ATRIAL APPENDAGE N/A 10/08/2022   Procedure: CLIPPING OF ATRIAL APPENDAGE USING ATRICURE ATRICLIP PRO2 SIZE 35;  Surgeon: Alleen Borne, MD;  Location: [facility] OR;  Service: Open Heart Surgery;  Laterality: N/A;   CORONARY ARTERY BYPASS GRAFT N/A 10/08/2022   Procedure: CORONARY ARTERY BYPASS GRAFTING (CABG) X3 USING LEFT INTERNAL MAMMARY ARTERY AND RIGHT GREATER SAPHENOUS VEIN HARVESTED ENDOSCOPICALLY;  Surgeon: Alleen Borne, MD;  Location: [facility] OR;  Service: Open Heart Surgery;  Laterality: N/A;   CORONARY PRESSURE/FFR STUDY N/A 09/07/2022   Procedure: INTRAVASCULAR PRESSURE WIRE/FFR STUDY;  Surgeon: Yvonne Kendall, MD;  Location: [facility] INVASIVE CV LAB;  Service: Cardiovascular;  Laterality:  N/A;   FOOT SURGERY     MANDIBLE SURGERY     MAZE N/A 10/08/2022   Procedure: PULMONARY VEIN ABLATION USING ATRICURE ISOLATOR SYNERGY ENCOMPASS OLH;  Surgeon: Alleen Borne, MD;  Location: [facility] OR;  Service: Open Heart Surgery;  Laterality: N/A;   RIGHT/LEFT HEART CATH AND CORONARY ANGIOGRAPHY N/A 09/07/2022   Procedure: RIGHT/LEFT HEART CATH AND CORONARY ANGIOGRAPHY;  Surgeon: Yvonne Kendall, MD;  Location: [facility] INVASIVE CV LAB;  Service: Cardiovascular;  Laterality: N/A;   TEE WITHOUT CARDIOVERSION N/A 10/08/2022   Procedure: TRANSESOPHAGEAL ECHOCARDIOGRAM;  Surgeon: Alleen Borne, MD;  Location: [facility] OR;  Service: Open Heart Surgery;  Laterality: N/A;   TONSILLECTOMY     TUBAL LIGATION     with ablation   TYMPANOSTOMY TUBE PLACEMENT      Inpatient Medications: Scheduled Meds:  ascorbic acid  500 mg Oral Daily   atorvastatin  80 mg Oral Daily   cyanocobalamin  1,000 mcg Oral Daily   doxycycline  100 mg Oral Q12H   ferrous sulfate  325 mg Oral Daily   heparin  5,000 Units Subcutaneous Q8H   methocarbamol  500 mg Oral TID   metoprolol succinate  25 mg Oral Daily   nicotine  21 mg Transdermal Once   pantoprazole (PROTONIX) IV  40 mg Intravenous Q12H   predniSONE  40 mg Oral Q breakfast   sucralfate  1 g Oral TID WC & HS   Continuous Infusions:  sodium chloride 75 mL/hr at 01/26/23 2309   levofloxacin (LEVAQUIN) IV 750 mg (01/27/23 0157)   PRN Meds: albuterol, HYDROmorphone (DILAUDID) injection, lip balm, nitroGLYCERIN, ondansetron **OR** ondansetron (ZOFRAN) IV, oxyCODONE, pregabalin  Allergies:    Allergies  Allergen Reactions   Zoloft [Sertraline] Other (See Comments)    Altered mental state    Social History:   Social History   Socioeconomic History   Marital status: Married    Spouse name: Not on file   Number of children: 2   Years of education: Not on file   Highest education level: Not on file  Occupational History   Occupation: Works at home  Tobacco Use    Smoking status: Every Day    Current packs/day: 1.00    Types: Cigarettes   Smokeless tobacco: Never  Vaping Use   Vaping status: Never Used  Substance and Sexual Activity   Alcohol use: Yes    Comment: occasional   Drug use: Never   Sexual activity: Not on file  Other Topics Concern   Not on file  Social History Narrative   Not on file  Social Determinants of Health   Financial Resource Strain: Not on file  Food Insecurity: No Food Insecurity (10/08/2022)   Hunger Vital Sign    Worried About Running Out of Food in the Last Year: Never true    Ran Out of Food in the Last Year: Never true  Transportation Needs: No Transportation Needs (10/08/2022)   PRAPARE - Administrator, Civil Service (Medical): No    Lack of Transportation (Non-Medical): No  Physical Activity: Not on file  Stress: Not on file  Social Connections: Unknown (09/10/2022)   [facility]   Social Network    Social Network: Not on file  Intimate Partner Violence: Not At Risk (10/08/2022)   Humiliation, Afraid, Rape, and Kick questionnaire    Fear of Current or Ex-Partner: No    Emotionally Abused: No    Physically Abused: No    Sexually Abused: No    Family History:   Family History  Problem Relation Age of Onset   Hypertension Father    Heart disease Father      ROS:  Please see the history of present illness.  All other ROS reviewed and negative.     Physical Exam/Data:   Vitals:   01/26/23 2036 01/26/23 2107 01/27/23 0209 01/27/23 0931  BP:  122/66 114/71 124/87  Pulse:  (!) 110 97 99  Resp:  18 18 14   Temp:  99 F (37.2 C) 98.3 F (36.8 C) 98.2 F (36.8 C)  TempSrc:  Oral Oral Oral  SpO2:  96% 94% 95%  Height: 5\' 2"  (1.575 m)       Intake/Output Summary (Last 24 hours) at 01/27/2023 1039 Last data filed at 01/27/2023 1000 Gross per 24 hour  Intake 1337.18 ml  Output 3200 ml  Net -1862.82 ml      01/04/2023    1:36 PM 11/16/2022    3:20 PM 11/04/2022     9:17 AM  Last 3 Weights  Weight (lbs) 177 lb 176 lb 4 oz 174 lb  Weight (kg) 80.287 kg 79.946 kg 78.926 kg     Body mass index is 32.37 kg/m.  General: 52 y.o. female resting comfortably in no acute distress. HEENT: Normocephalic and atraumatic. Sclera clear.  Neck: No JVD. Heart: RRR. Distinct S1 and S2. No murmurs, gallops, or rubs.  Lungs: No increased work of breathing. Crackles noted in bilateral bases.  Abdomen: Soft, non-distended, and non-tender to palpation.  Extremities: No lower extremity edema. edema.    Skin: Warm and dry. Neuro: Alert and oriented x3. No focal deficits. Psych: Normal affect. Responds appropriately.   EKG:  The EKG was personally reviewed and demonstrates:  Sinus tachycardia, rate 106 bpm, with T wave versions in leads aVL and V2. Telemetry:  Telemetry was personally reviewed and demonstrates:  Sinus rhythm with rates in the 90s to 100s.  Relevant CV Studies:  Echocardiogram 07/20/2022: Impressions:  1. Left ventricular ejection fraction, by estimation, is 35 to 40%. The  left ventricle has moderately decreased function. The left ventricle  demonstrates regional wall motion abnormalities (see scoring  diagram/findings for description). The left  ventricular internal cavity size was mildly dilated. Left ventricular  diastolic parameters are indeterminate. Mild global hypokinesis with  moderate hypokinesis of septal wall.   2. Right ventricular systolic function is normal. The right ventricular  size is normal.   3. Left atrial size was moderately dilated.   4. The mitral valve is normal in structure. Mild to moderate mitral  valve  regurgitation. No evidence of mitral stenosis.   5. The aortic valve is grossly normal. There is mild calcification of the  aortic valve. Aortic valve regurgitation is not visualized. Aortic valve  sclerosis is present, with no evidence of aortic valve stenosis.   6. The inferior vena cava is normal in size with greater  than 50%  respiratory variability, suggesting right atrial pressure of 3 mmHg.   7. Cannot exclude a small PFO.  _______________  Monitor 07/2022: Sinus.  4 Ventricular Tachycardia runs occurred, the longest was 13 beats.  3 Supraventricular Tachycardia runs occurred, the longest was 7 beats  Rare premature atrial contractions.  Rare premature ventricular contractions. _______________  Right/ Left Cardiac Catheterization 09/07/2022: Conclusions: Multivessel coronary artery disease, as detailed below.  Most significant disease involves the mid LAD, where sequential 90% and 70% lesions flank an aneurysmal segment where a large D2 branch arises from.  The LAD lesions are highly significant by RFR (RFR 0.46).  D2 also contains a 70% proximal stenosis.  LCx and RCA demonstrate non-obstructive coronary artery disease (RFR mid RCA 0.94). Upper normal to mildly elevated left heart filling pressures (LVEDP 17 mmHg, PCWP 13 mmHg). Normal right heart and pulmonary artery pressures (mean RA 6 mmHg, mean PAP 18 mmHg). Mildly reduced Fick cardiac output/index (CO 4.0 L/min, CI 2.2 L/min/m^2).   Recommendations: Given complex anatomy of LAD/D2 disease, recommend cardiac surgery consultation and HeartTeam discussion regarding optimal management strategy. Discontinue diltiazem and start metoprolol succinate 25 mg daily in the setting of cardiomyophathy. Restart apixaban tomorrow morning if no evidence of bleeding/vascular complications at catheterization sites. Aggressive secondary prevention of coronary artery disease.  Consider escalation of statin therapy to high-intensity dosing at follow-up. _______________  Echocardiogram 12/18/2022: Impressions:  1. Left ventricular ejection fraction, by estimation, is 55 to 60%. Left  ventricular ejection fraction by PLAX is 59 %. The left ventricle has  normal function. The left ventricle demonstrates regional wall motion  abnormalities (see scoring   diagram/findings for description). There is mild left ventricular  hypertrophy. Left ventricular diastolic parameters are consistent with  Grade I diastolic dysfunction (impaired relaxation). There is moderate  hypokinesis of the left ventricular, basal-mid   septal wall.   2. Right ventricular systolic function is moderately reduced. The right  ventricular size is normal. Tricuspid regurgitation signal is inadequate  for assessing PA pressure.   3. The mitral valve is abnormal. Trivial mitral valve regurgitation.   4. The aortic valve is tricuspid. Aortic valve regurgitation is not  visualized. Aortic valve sclerosis is present, with no evidence of aortic  valve stenosis.   5. Aortic dilatation noted. There is borderline dilatation of the  ascending aorta, measuring 37 mm.   6. The inferior vena cava is normal in size with greater than 50%  respiratory variability, suggesting right atrial pressure of 3 mmHg.   7. Cannot exclude a small PFO.   Comparison(s): Changes from prior study are noted. 10/08/2022: LVEF 35-40%.    Laboratory Data:  High Sensitivity Troponin:   Recent Labs  Lab 01/26/23 1106 01/26/23 1236  TROPONINIHS 14 12     Chemistry Recent Labs  Lab 01/26/23 1106 01/26/23 2108 01/27/23 0051  NA 138  --  134*  K 4.2  --  4.1  CL 102  --  100  CO2 28  --  26  GLUCOSE 102*  --  103*  BUN 8  --  5*  CREATININE 0.63 0.64 0.65  CALCIUM 9.1  --  8.6*  GFRNONAA >60 >60 >60  ANIONGAP 8  --  8    Recent Labs  Lab 01/26/23 1124 01/27/23 0051  PROT 7.3 6.2*  ALBUMIN 4.2 3.3*  AST 13* 18  ALT 11 15  ALKPHOS 129* 134*  BILITOT 0.3 0.5   Lipids No results for input(s): "CHOL", "TRIG", "HDL", "LABVLDL", "LDLCALC", "CHOLHDL" in the last 168 hours.  Hematology Recent Labs  Lab 01/26/23 1106 01/26/23 2108 01/27/23 0051  WBC 11.3* 10.2 9.5  RBC 4.75 4.43 4.12  HGB 13.5 12.6 11.9*  HCT 41.2 39.3 37.8  MCV 86.7 88.7 91.7  MCH 28.4 28.4 28.9  MCHC 32.8 32.1  31.5  RDW 14.2 14.4 14.5  PLT 368 315 291   Thyroid No results for input(s): "TSH", "FREET4" in the last 168 hours.  BNPNo results for input(s): "BNP", "PROBNP" in the last 168 hours.  DDimer No results for input(s): "DDIMER" in the last 168 hours.   Radiology/Studies:  CT Angio Chest PE W and/or Wo Contrast  Result Date: 01/26/2023 CLINICAL DATA:  Intermittent left-sided rib pain for the past few weeks. EXAM: CT ANGIOGRAPHY CHEST CT ABDOMEN AND PELVIS WITH CONTRAST TECHNIQUE: Multidetector CT imaging of the chest was performed using the standard protocol during bolus administration of intravenous contrast. Multiplanar CT image reconstructions and MIPs were obtained to evaluate the vascular anatomy. Multidetector CT imaging of the abdomen and pelvis was performed using the standard protocol during bolus administration of intravenous contrast. RADIATION DOSE REDUCTION: This exam was performed according to the departmental dose-optimization program which includes automated exposure control, adjustment of the mA and/or kV according to patient size and/or use of iterative reconstruction technique. CONTRAST:  OMNIPAQUE IOHEXOL 350 MG/ML SOLN COMPARISON:  CT abdomen pelvis dated October 02, 2021. FINDINGS: CTA CHEST FINDINGS Cardiovascular: Satisfactory opacification of the pulmonary arteries to the segmental level. No evidence of pulmonary embolism. Mild cardiomegaly status post CABG and left atrial appendage clipping. Trace pericardial effusion. No thoracic aortic aneurysm or dissection. Coronary, aortic arch, and branch vessel atherosclerotic vascular disease. Mediastinum/Nodes: No enlarged mediastinal, hilar, or axillary lymph nodes. Thyroid gland, trachea, and esophagus demonstrate no significant findings. Lungs/Pleura: Ill-defined tiny centrilobular ground-glass densities throughout both lungs. Mild mosaic attenuation. No focal consolidation, pleural effusion, or pneumothorax. Musculoskeletal: No  chest wall abnormality. No acute or significant osseous findings. Prior median sternotomy with nonunion. Review of the MIP images confirms the above findings. CT ABDOMEN PELVIS FINDINGS Hepatobiliary: No focal liver abnormality is seen. Status post cholecystectomy. No biliary dilatation. Pancreas: Unremarkable. No pancreatic ductal dilatation or surrounding inflammatory changes. Spleen: Normal in size without focal abnormality. Adrenals/Urinary Tract: Unchanged 1.2 cm right and 1.8 cm left adrenal nodules previously characterized as adenomas. No follow-up imaging is recommended. No renal calculi, mass, or hydronephrosis. The bladder is unremarkable. Stomach/Bowel: Stomach is within normal limits. Appendix appears normal. No evidence of bowel wall thickening, distention, or inflammatory changes. Mild sigmoid colonic diverticulosis. Vascular/Lymphatic: Aortic atherosclerosis. No enlarged abdominal or pelvic lymph nodes. Reproductive: Uterus and bilateral adnexa are unremarkable. Other: No abdominal wall hernia or abnormality. No abdominopelvic ascites. No pneumoperitoneum. Musculoskeletal: No acute or significant osseous findings. Review of the MIP images confirms the above findings. IMPRESSION: CT chest: 1. No evidence of pulmonary embolism. 2. Ill-defined tiny centrilobular ground-glass densities throughout both lungs with mild mosaic attenuation. Differential considerations include respiratory bronchiolitis and hypersensitivity pneumonitis. 3.  Aortic Atherosclerosis (ICD10-I70.0). CT abdomen pelvis: 1.  No acute intra-abdominal process. Electronically Signed   By: Vickki Hearing.D.  On: 01/26/2023 13:49   CT ABDOMEN PELVIS W CONTRAST  Result Date: 01/26/2023 CLINICAL DATA:  Intermittent left-sided rib pain for the past few weeks. EXAM: CT ANGIOGRAPHY CHEST CT ABDOMEN AND PELVIS WITH CONTRAST TECHNIQUE: Multidetector CT imaging of the chest was performed using the standard protocol during bolus  administration of intravenous contrast. Multiplanar CT image reconstructions and MIPs were obtained to evaluate the vascular anatomy. Multidetector CT imaging of the abdomen and pelvis was performed using the standard protocol during bolus administration of intravenous contrast. RADIATION DOSE REDUCTION: This exam was performed according to the departmental dose-optimization program which includes automated exposure control, adjustment of the mA and/or kV according to patient size and/or use of iterative reconstruction technique. CONTRAST:  OMNIPAQUE IOHEXOL 350 MG/ML SOLN COMPARISON:  CT abdomen pelvis dated October 02, 2021. FINDINGS: CTA CHEST FINDINGS Cardiovascular: Satisfactory opacification of the pulmonary arteries to the segmental level. No evidence of pulmonary embolism. Mild cardiomegaly status post CABG and left atrial appendage clipping. Trace pericardial effusion. No thoracic aortic aneurysm or dissection. Coronary, aortic arch, and branch vessel atherosclerotic vascular disease. Mediastinum/Nodes: No enlarged mediastinal, hilar, or axillary lymph nodes. Thyroid gland, trachea, and esophagus demonstrate no significant findings. Lungs/Pleura: Ill-defined tiny centrilobular ground-glass densities throughout both lungs. Mild mosaic attenuation. No focal consolidation, pleural effusion, or pneumothorax. Musculoskeletal: No chest wall abnormality. No acute or significant osseous findings. Prior median sternotomy with nonunion. Review of the MIP images confirms the above findings. CT ABDOMEN PELVIS FINDINGS Hepatobiliary: No focal liver abnormality is seen. Status post cholecystectomy. No biliary dilatation. Pancreas: Unremarkable. No pancreatic ductal dilatation or surrounding inflammatory changes. Spleen: Normal in size without focal abnormality. Adrenals/Urinary Tract: Unchanged 1.2 cm right and 1.8 cm left adrenal nodules previously characterized as adenomas. No follow-up imaging is recommended. No  renal calculi, mass, or hydronephrosis. The bladder is unremarkable. Stomach/Bowel: Stomach is within normal limits. Appendix appears normal. No evidence of bowel wall thickening, distention, or inflammatory changes. Mild sigmoid colonic diverticulosis. Vascular/Lymphatic: Aortic atherosclerosis. No enlarged abdominal or pelvic lymph nodes. Reproductive: Uterus and bilateral adnexa are unremarkable. Other: No abdominal wall hernia or abnormality. No abdominopelvic ascites. No pneumoperitoneum. Musculoskeletal: No acute or significant osseous findings. Review of the MIP images confirms the above findings. IMPRESSION: CT chest: 1. No evidence of pulmonary embolism. 2. Ill-defined tiny centrilobular ground-glass densities throughout both lungs with mild mosaic attenuation. Differential considerations include respiratory bronchiolitis and hypersensitivity pneumonitis. 3.  Aortic Atherosclerosis (ICD10-I70.0). CT abdomen pelvis: 1.  No acute intra-abdominal process. Electronically Signed   By: Obie Dredge M.D.   On: 01/26/2023 13:49   DG Chest Portable 1 View  Result Date: 01/26/2023 CLINICAL DATA:  Chest pain EXAM: PORTABLE CHEST 1 VIEW COMPARISON:  11/02/2022 FINDINGS: Cardiomegaly status post median sternotomy with left atrial appendage clip. Mild diffuse bilateral interstitial opacity. Osseous structures unremarkable. IMPRESSION: Cardiomegaly with mild diffuse bilateral interstitial opacity, consistent with edema or atypical/viral infection. No focal airspace opacity. Electronically Signed   By: Jearld Lesch M.D.   On: 01/26/2023 12:42     Assessment and Plan:   Pre-Op Evaluation Patient presented with severe left upper quadrant/epigastric pain rating up into her chest.  GI was consulted and is recommending EGD.  Cardiology consulted for preop evaluation for CABG in 09/2022. She reports some dyspnea on exertion and fatigue since her surgery but suspect this is due to deconditioning. Repeat Echo last  month showed normalization of EF to 55-60%. She denies any angina. She does have some mild crackles in bilateral  bases but primary team is treating her for possible atypical pneumonia/ chronic aspiration. Will check BNP to make sure she does not need any diuresis before procedure. Per Revised Cardiac Risk Index, considered Class II Risk with 6.0% chance of 30-day risks of death, MI, or cardiac arrest. No additional cardiac work-up necessary prior to low risk GI procedure.. Will review with MD.  CAD s/p CABG S/p CABG x 3 in 09/2022.  High-sensitivity troponin negative x 2 this admission. - No chest pain. - She does not appear to be on a Aspirin. Recommend starting 81mg  daily. Can do this after GI work-up. - Continue beta-blocker and statin.  - She never started Cardiac Rehab. Would recommend this after current admission.  Ischemic Cardiomyopathy LVEF 35-40% on Echo in 07/2022 but normalized after CABG. Most recent Echo in 12/2022 showed LVEF of 55-60%  with moderate hypokinesis of the basal mid septal wall and grade 1 diastolic dysfunction. - Mild crackles on exam. However, she is being treated for possible atypical pneumonia/ chronic aspiration. Will check BNP. - Euvolemic on exam. - Only on PO Lasix as needed at home. No need for diuretics at this time.  - GDMT has been limited due to soft BP (required Midodrine after CABG). Continue Toprol-XL 25mg  daily.   Palpitations Possible Atrial Fibrillation Patient has a history of palpitations. She reportedly had an EKG done at a fire department at the end of 2023 which showed atrial fibrillation but we don't have any records from this. Monitor in 07/2022 showed short runs of NSVT and SVT as well as rare PACs/ PVCs but no atrial fibrillation. She was briefly on Eliquis but this was stopped after CABG due to acute blood loss anemia requiring a blood transfusion. Decision made not to restart Eliquis unless she has recurrent documentation of atrial fibrillation.   - Maintaining sinus rhythm. Rates currently in the 90s to low 100s. No evidence of atrial fibrillation. - Continue Toprol-XL 25mg  daily.  Can increase if needed for additional heart rate control but suspect abdominal pain is driving higher rates. She previously has had issues with low BP (required Midodrine after CABG) but BP is fine this admission (110-140s/70-90s).  Hyperlipidemia - Continue Lipitor 80mg  daily.  Otherwise, per primary team: - Severe abdominal pain - GERD - ADHD - Anxiety - Rheumatoid arthritis - Fibromyalgia   Risk Assessment/Risk Scores:    New York Heart Association (NYHA) Functional Class NYHA Class II-III   For questions or updates, please contact [facility] Please consult [email or web address] for contact info under    Signed, Corrin Parker, PA-C  01/27/2023 10:39 AM  Personally seen and examined. Agree with APP above with the following comments:  Briefly 52 yo F with obstructive CAD with recent CABG, PVI and LAA ligation for possible AF, ischemic cardiomyopathy recovered with bypass, Tobacco abuse with abdominal pain   Patient notes that she had felt better after CABG.   Was doing well until about one month ago.  She was curious about how cigarettes tasted now that she is post bypass.  She ended up starting smoking again.   01/26/23 had LUQ pain.  No sternal pain.  No chest pain.  Rare palpitations.  NO SOB.  Exam notable for isolated bilateral crackles in the bases.  No murmurs.  Sternal site is clear.  Rest as above.  Labs notable for hsTroponin 14, 12.  EKG sinus tachycardia Tele: SR  Would recommend  - non cardiac CP; GI work up is reasonable - She  is moderate risk for a low risk procedure, she has had improvement in her LVEF; EGD is reasonable - new crackles, will get BNP, set up incentive spirometer for patient - would benefit from complete smoking cessation, discussed with patient - Would benefit from outpatient cardiac rehab;  she is now amenable to this  Riley Lam, MD FASE [facility] Cardiologist [facility]  [address] [place], [place] [address] [phone]  2:16 PM

## 2023-01-27 NOTE — H&P (View-Only) (Signed)
[name]  Referring Provider: No ref. provider found Primary Care Physician:  Janet Retort, [name] Primary Gastroenterologist: [name] - [name]  Reason for Consultation: epigastric and left upper quadrant abdominal pain  SUBJECTIVE:   HPI: [name] is a 52 y.o. female with past medical history significant for coronary artery disease status post triple-vessel CABG in March 2024, atrial fibrillation, gastroesophageal reflux disease, rheumatoid arthritis and tobacco use.  Presented to hospital on 01/26/2023 with chief complaint of constant left upper quadrant and epigastric pain.  She noted having the symptoms intermittently over the past few weeks though when they became constant she sought medical attention.  She had attempted omeprazole with no relief.  She denied use of NSAIDs, aspirin.  She has history of alternating bowel habits including constipation and diarrhea, no blood per rectum.  No significant nausea [name] vomiting.  Chest discomfort radiates to left shoulder.  No shortness of breath.  No prior EGD.  She noted having colonoscopy roughly few years prior at outside facility, no record available for my review.  Noted that she has been experiencing dysphagia to solids/liquids/pills intermittently.  She has been seen in office by [name] for symptoms of dysphagia and was recommended to have barium esophagram, this was completed on 03/20/2010 and showed nonspecific esophageal motility disorder, otherwise unremarkable.  On presentation labs showed lipase 30, alkaline phosphatase 129, AST/ALT 13/11, total bilirubin 0.3, WBC 11.3, hemoglobin 13.5, platelet 368.  CT scan of abdomen pelvis showed no focal liver abnormality, unremarkable pancreas, stomach within normal limits, no bowel wall thickening, mild sigmoid diverticulosis.  Past Medical History:  Diagnosis Date   A-fib (HCC)    Anxiety    Arthralgia    Asthma    Attention-deficit hyperactivity  disorder, predominantly inattentive type    Back pain    Dysrhythmia    Afib   Fear of flying    Fibromyalgia    Gastroesophageal reflux disease    HPV in female    Joint pain    Localized edema    Neck pain    OA (osteoarthritis)    Papillomavirus as the cause of diseases classified elsewhere    Primary insomnia    Pure hypercholesterolemia    Rheumatoid arthritis, seronegative, hand, unspecified laterality (HCC)    patient feels it is back related, not a true arthritis   Spondylolisthesis of lumbosacral region    Tobacco dependence    Unspecified inflammatory spondylopathy, cervical region (HCC)    Vitamin B12 deficiency anemia    Voice hoarseness    Past Surgical History:  Procedure Laterality Date   ABDOMINAL EXPOSURE [name]/A 06/28/2020   Procedure: ABDOMINAL EXPOSURE;  Surgeon: Janet Shelling, [name];  Location: [name];  Service: Vascular;  Laterality: [name]/A;  anterior   ANTERIOR LUMBAR FUSION [name]/A 06/28/2020   Procedure: ANTERIOR LUMBAR INTERBODY FUSION LUMBAR FIVE-SACRAL ONE.;  Surgeon: Janet Alert, [name];  Location: [name];  Service: Neurosurgery;  Laterality: [name]/A;  anterior approach   BREAST ENHANCEMENT SURGERY     CARPAL TUNNEL RELEASE Left    CARPAL TUNNEL RELEASE Right 06/28/2020   Procedure: CARPAL TUNNEL RELEASE;  Surgeon: Janet Alert, [name];  Location: [name];  Service: Neurosurgery;  Laterality: Right;   CERVICAL SPINE SURGERY     CHOLECYSTECTOMY     CLIPPING OF ATRIAL APPENDAGE [name]/A 10/08/2022   Procedure: CLIPPING OF ATRIAL APPENDAGE USING ATRICURE ATRICLIP PRO2 SIZE 35;  Surgeon: Janet Borne, [name];  Location: [name];  Service:  Open Heart Surgery;  Laterality: [name]/A;   CORONARY ARTERY BYPASS GRAFT [name]/A 10/08/2022   Procedure: CORONARY ARTERY BYPASS GRAFTING (CABG) X3 USING LEFT INTERNAL MAMMARY ARTERY AND RIGHT GREATER SAPHENOUS VEIN HARVESTED ENDOSCOPICALLY;  Surgeon: Janet Borne, [name];  Location: [name];  Service: Open Heart Surgery;  Laterality: [name]/A;   CORONARY  PRESSURE/FFR STUDY [name]/A 09/07/2022   Procedure: INTRAVASCULAR PRESSURE WIRE/FFR STUDY;  Surgeon: Janet Kendall, [name];  Location: [name];  Service: Cardiovascular;  Laterality: [name]/A;   FOOT SURGERY     MANDIBLE SURGERY     MAZE [name]/A 10/08/2022   Procedure: PULMONARY VEIN ABLATION USING ATRICURE ISOLATOR SYNERGY ENCOMPASS OLH;  Surgeon: Janet Borne, [name];  Location: [name];  Service: Open Heart Surgery;  Laterality: [name]/A;   RIGHT/LEFT HEART CATH AND CORONARY ANGIOGRAPHY [name]/A 09/07/2022   Procedure: RIGHT/LEFT HEART CATH AND CORONARY ANGIOGRAPHY;  Surgeon: Janet Kendall, [name];  Location: [name];  Service: Cardiovascular;  Laterality: [name]/A;   TEE WITHOUT CARDIOVERSION [name]/A 10/08/2022   Procedure: TRANSESOPHAGEAL ECHOCARDIOGRAM;  Surgeon: Janet Borne, [name];  Location: [name];  Service: Open Heart Surgery;  Laterality: [name]/A;   TONSILLECTOMY     TUBAL LIGATION     with ablation   TYMPANOSTOMY TUBE PLACEMENT     Prior to Admission medications   Medication Sig Start Date End Date Taking? Authorizing Provider  albuterol (VENTOLIN HFA) 108 (90 Base) MCG/ACT inhaler Inhale 2 puffs into the lungs every 4 (four) hours as needed for wheezing [name] shortness of breath. 10/15/22  Yes [name], Janet [place], [name]  amphetamine-dextroamphetamine (ADDERALL XR) 30 MG 24 hr capsule Take 60 mg by mouth daily.   Yes [name]  Ascorbic Acid (VITAMIN C) 500 MG CAPS Take 1 capsule by mouth daily.   Yes [name]  atorvastatin (LIPITOR) 80 MG tablet Take 1 tablet (80 mg total) by mouth daily. 09/09/22  Yes [name], [name], [name]  cyanocobalamin (VITAMIN B12) 1000 MCG tablet Take 1,000 mcg by mouth daily.   Yes [name]  escitalopram (LEXAPRO) 10 MG tablet Take 10 mg by mouth daily as needed (anxiety).   Yes [name]  ferrous sulfate 325 (65 FE) MG EC tablet Take 325 mg by mouth daily.   Yes [name]  furosemide (LASIX) 20 MG tablet  Take 1 tablet (20 mg total) by mouth daily as needed (for lower extremity edema). 11/02/22  Yes [name], [name], [name]  gabapentin (NEURONTIN) 100 MG capsule Take 100-200 mg by mouth 4 (four) times daily as needed (pain).   Yes [name]  gabapentin (NEURONTIN) 300 MG capsule Take 600 mg by mouth daily as needed (pain).   Yes [name]  metoprolol succinate (TOPROL-XL) 25 MG 24 hr tablet Take 1 tablet (25 mg total) by mouth in the morning and at bedtime. 09/25/22  Yes Kathleene Hazel, [name]  nicotine (NICODERM CQ - DOSED IN MG/24 HOURS) 21 mg/24hr patch Place 21 mg onto the skin daily.   Yes [name]  nitroGLYCERIN (NITROSTAT) 0.4 MG SL tablet Place 1 tablet (0.4 mg total) under the tongue every 5 (five) minutes as needed for chest pain. 07/24/22 01/26/23 Yes [name], [name], [name]  omeprazole (PRILOSEC) 20 MG capsule Take 40 mg by mouth daily.   Yes [name]  VITAMIN D, CHOLECALCIFEROL, PO Take 1 tablet by mouth daily.   Yes [name]  zolpidem (AMBIEN) 10 MG tablet Take 10 mg  by mouth at bedtime.   Yes [name]   Current Facility-Administered Medications  Medication Dose Route Frequency Provider Last Rate Last Admin   0.9 %  sodium chloride infusion   Intravenous Continuous Lurline Del, [name] 75 mL/hr at 01/26/23 2309 New Bag at 01/26/23 2309   albuterol (PROVENTIL) (2.5 MG/3ML) 0.083% nebulizer solution 2.5 mg  2.5 mg Nebulization Q2H PRN Lurline Del, [name]       ascorbic acid (VITAMIN C) tablet 500 mg  500 mg Oral Daily Skip Mayer A, [name]   500 mg at 01/27/23 0823   atorvastatin (LIPITOR) tablet 80 mg  80 mg Oral Daily Lurline Del, [name]   80 mg at 01/27/23 3664   cyanocobalamin (VITAMIN B12) tablet 1,000 mcg  1,000 mcg Oral Daily Skip Mayer A, [name]   1,000 mcg at 01/27/23 4034   doxycycline (VIBRA-TABS) tablet 100 mg  100 mg Oral Q12H Meredeth Ide, [name]       ferrous sulfate tablet  325 mg  325 mg Oral Daily Skip Mayer A, [name]   325 mg at 01/27/23 0820   heparin injection 5,000 Units  5,000 Units Subcutaneous Q8H Skip Mayer A, [name]   5,000 Units at 01/27/23 7425   HYDROmorphone (DILAUDID) injection 0.5 mg  0.5 mg Intravenous Q3H PRN Skip Mayer A, [name]   0.5 mg at 01/26/23 2035   levofloxacin (LEVAQUIN) IVPB 750 mg  750 mg Intravenous Q24H Skip Mayer A, [name] 100 mL/hr at 01/27/23 0157 750 mg at 01/27/23 0157   lip balm (CARMEX) ointment   Topical PRN Lurline Del, [name]   1 Application at 01/26/23 2310   methocarbamol (ROBAXIN) tablet 500 mg  500 mg Oral TID Meredeth Ide, [name]       metoprolol succinate (TOPROL-XL) 24 hr tablet 25 mg  25 mg Oral Daily Skip Mayer A, [name]   25 mg at 01/27/23 9563   nicotine (NICODERM CQ - dosed in mg/24 hours) patch 21 mg  21 mg Transdermal Once Ernie Avena, [name]   21 mg at 01/26/23 1203   nitroGLYCERIN (NITROSTAT) SL tablet 0.4 mg  0.4 mg Sublingual Q5 min PRN Lurline Del, [name]       ondansetron [facility]) tablet 4 mg  4 mg Oral Q6H PRN Lurline Del, [name]       [name]   ondansetron [facility]) injection 4 mg  4 mg Intravenous Q6H PRN Skip Mayer A, [name]       oxyCODONE (Oxy IR/ROXICODONE) immediate release tablet 5 mg  5 mg Oral Q4H PRN Skip Mayer A, [name]   5 mg at 01/27/23 8756   pantoprazole (PROTONIX) injection 40 mg  40 mg Intravenous Q12H Liliane Shi H, DO   40 mg at 01/27/23 4332   predniSONE (DELTASONE) tablet 40 mg  40 mg Oral Q breakfast Meredeth Ide, [name]       pregabalin (LYRICA) capsule 75 mg  75 mg Oral TID PRN Lurline Del, [name]   75 mg at 01/26/23 2144   sucralfate (CARAFATE) 1 GM/10ML suspension 1 g  1 g Oral TID WC & HS Liliane Shi H, DO   1 g at 01/27/23 9518   Allergies as of 01/26/2023 - Review Complete 01/26/2023  Allergen Reaction Noted   Zoloft [sertraline] Other (See Comments) 07/17/2019   Family History  Problem Relation Age of Onset   Hypertension Father     Heart disease Father    Social History  Socioeconomic History   Marital status: Married    Spouse name: Not on file   Number of children: 2   Years of education: Not on file   Highest education level: Not on file  Occupational History   Occupation: Works at home  Tobacco Use   Smoking status: Every Day    Current packs/day: 1.00    Types: Cigarettes   Smokeless tobacco: Never  Vaping Use   Vaping status: Never Used  Substance and Sexual Activity   Alcohol use: Yes    Comment: occasional   Drug use: Never   Sexual activity: Not on file  Other Topics Concern   Not on file  Social History Narrative   Not on file   Social Determinants of Health   Financial Resource Strain: Not on file  Food Insecurity: No Food Insecurity (10/08/2022)   Hunger Vital Sign    Worried About Running Out of Food in the Last Year: Never true    Ran Out of Food in the Last Year: Never true  Transportation Needs: No Transportation Needs (10/08/2022)   PRAPARE - Administrator, Civil Service (Medical): No    Lack of Transportation (Non-Medical): No  Physical Activity: Not on file  Stress: Not on file  Social Connections: Unknown (09/10/2022)   Received from [facility]   Social Network    Social Network: Not on file  Intimate Partner Violence: Not At Risk (10/08/2022)   Humiliation, Afraid, Rape, and Kick questionnaire    Fear of Current [name] Ex-Partner: No    Emotionally Abused: No    Physically Abused: No    Sexually Abused: No   Review of Systems:  Review of Systems  Respiratory:  Negative for shortness of breath.   Cardiovascular:  Positive for chest pain.  Gastrointestinal:  Positive for abdominal pain, constipation and diarrhea. Negative for blood in stool, nausea and vomiting.    OBJECTIVE:   Temp:  [98.2 F (36.8 C)-99 F (37.2 C)] 98.2 F (36.8 C) (07/17 0931) Pulse Rate:  [97-110] 99 (07/17 0931) Resp:  [14-22] 14 (07/17 0931) BP: (114-142)/(59-94) 124/87 (07/17  0931) SpO2:  [94 %-98 %] 95 % (07/17 0931) Last BM Date : 01/26/23 Physical Exam Constitutional:      General: She is not in acute distress.    Appearance: She is not ill-appearing, toxic-appearing [name] diaphoretic.  Cardiovascular:     Rate and Rhythm: Normal rate and regular rhythm.  Pulmonary:     Effort: No respiratory distress.     Breath sounds: Normal breath sounds.  Abdominal:     General: Bowel sounds are normal. There is no distension.     Palpations: Abdomen is soft.     Tenderness: There is abdominal tenderness. There is no guarding.  Musculoskeletal:     Right lower leg: No edema.     Left lower leg: No edema.  Skin:    General: Skin is warm and dry.  Neurological:     Mental Status: She is [name].     Labs: Recent Labs    01/26/23 1106 01/26/23 2108 01/27/23 0051  WBC 11.3* 10.2 9.5  HGB 13.5 12.6 11.9*  HCT 41.2 39.3 37.8  PLT 368 315 291   BMET Recent Labs    01/26/23 1106 01/26/23 2108 01/27/23 0051  NA 138  --  134*  K 4.2  --  4.1  CL 102  --  100  CO2 28  --  26  GLUCOSE 102*  --  103*  BUN 8  --  5*  CREATININE 0.63 0.64 0.65  CALCIUM 9.1  --  8.6*   LFT Recent Labs    01/26/23 1124 01/27/23 0051  PROT 7.3 6.2*  ALBUMIN 4.2 3.3*  AST 13* 18  ALT 11 15  ALKPHOS 129* 134*  BILITOT 0.3 0.5  BILIDIR 0.1  --   IBILI 0.2*  --    PT/INR Recent Labs    01/27/23 0051  LABPROT 14.4  INR 1.1    Diagnostic imaging: CT Angio Chest PE W and/[name] Wo Contrast  Result Date: 01/26/2023 CLINICAL DATA:  Intermittent left-sided rib pain for the past few weeks. EXAM: CT ANGIOGRAPHY CHEST CT ABDOMEN AND PELVIS WITH CONTRAST TECHNIQUE: Multidetector CT imaging of the chest was performed using the standard protocol during bolus administration of intravenous contrast. Multiplanar CT image reconstructions and MIPs were obtained to evaluate the vascular anatomy. Multidetector CT imaging of the abdomen and pelvis was performed using the standard protocol  during bolus administration of intravenous contrast. RADIATION DOSE REDUCTION: This exam was performed according to the departmental dose-optimization program which includes automated exposure control, adjustment of the mA and/[name] kV according to patient size and/[name] use of iterative reconstruction technique. CONTRAST:  OMNIPAQUE IOHEXOL 350 MG/ML SOLN COMPARISON:  CT abdomen pelvis dated October 02, 2021. FINDINGS: CTA CHEST FINDINGS Cardiovascular: Satisfactory opacification of the pulmonary arteries to the segmental level. No evidence of pulmonary embolism. Mild cardiomegaly status post CABG and left atrial appendage clipping. Trace pericardial effusion. No thoracic aortic aneurysm [name] dissection. Coronary, aortic arch, and branch vessel atherosclerotic vascular disease. Mediastinum/Nodes: No enlarged mediastinal, hilar, [name] axillary lymph nodes. Thyroid gland, trachea, and esophagus demonstrate no significant findings. Lungs/Pleura: Ill-defined tiny centrilobular ground-glass densities throughout both lungs. Mild mosaic attenuation. No focal consolidation, pleural effusion, [name] pneumothorax. Musculoskeletal: No chest wall abnormality. No acute [name] significant osseous findings. Prior median sternotomy with nonunion. Review of the MIP images confirms the above findings. CT ABDOMEN PELVIS FINDINGS Hepatobiliary: No focal liver abnormality is seen. Status post cholecystectomy. No biliary dilatation. Pancreas: Unremarkable. No pancreatic ductal dilatation [name] surrounding inflammatory changes. Spleen: Normal in size without focal abnormality. Adrenals/Urinary Tract: Unchanged 1.2 cm right and 1.8 cm left adrenal nodules previously characterized as adenomas. No follow-up imaging is recommended. No renal calculi, mass, [name] hydronephrosis. The bladder is unremarkable. Stomach/Bowel: Stomach is within normal limits. Appendix appears normal. No evidence of bowel wall thickening, distention, [name] inflammatory changes. Mild  sigmoid colonic diverticulosis. Vascular/Lymphatic: Aortic atherosclerosis. No enlarged abdominal [name] pelvic lymph nodes. Reproductive: Uterus and bilateral adnexa are unremarkable. Other: No abdominal wall hernia [name] abnormality. No abdominopelvic ascites. No pneumoperitoneum. Musculoskeletal: No acute [name] significant osseous findings. Review of the MIP images confirms the above findings. IMPRESSION: CT chest: 1. No evidence of pulmonary embolism. 2. Ill-defined tiny centrilobular ground-glass densities throughout both lungs with mild mosaic attenuation. Differential considerations include respiratory bronchiolitis and hypersensitivity pneumonitis. 3.  Aortic Atherosclerosis (ICD10-I70.0). CT abdomen pelvis: 1.  No acute intra-abdominal process. Electronically Signed   By: Obie Dredge M.D.   On: 01/26/2023 13:49   CT ABDOMEN PELVIS W CONTRAST  Result Date: 01/26/2023 CLINICAL DATA:  Intermittent left-sided rib pain for the past few weeks. EXAM: CT ANGIOGRAPHY CHEST CT ABDOMEN AND PELVIS WITH CONTRAST TECHNIQUE: Multidetector CT imaging of the chest was performed using the standard protocol during bolus administration of intravenous contrast. Multiplanar CT image reconstructions and MIPs were obtained to evaluate the vascular anatomy. Multidetector CT imaging of the abdomen  and pelvis was performed using the standard protocol during bolus administration of intravenous contrast. RADIATION DOSE REDUCTION: This exam was performed according to the departmental dose-optimization program which includes automated exposure control, adjustment of the mA and/[name] kV according to patient size and/[name] use of iterative reconstruction technique. CONTRAST:  OMNIPAQUE IOHEXOL 350 MG/ML SOLN COMPARISON:  CT abdomen pelvis dated October 02, 2021. FINDINGS: CTA CHEST FINDINGS Cardiovascular: Satisfactory opacification of the pulmonary arteries to the segmental level. No evidence of pulmonary embolism. Mild cardiomegaly status  post CABG and left atrial appendage clipping. Trace pericardial effusion. No thoracic aortic aneurysm [name] dissection. Coronary, aortic arch, and branch vessel atherosclerotic vascular disease. Mediastinum/Nodes: No enlarged mediastinal, hilar, [name] axillary lymph nodes. Thyroid gland, trachea, and esophagus demonstrate no significant findings. Lungs/Pleura: Ill-defined tiny centrilobular ground-glass densities throughout both lungs. Mild mosaic attenuation. No focal consolidation, pleural effusion, [name] pneumothorax. Musculoskeletal: No chest wall abnormality. No acute [name] significant osseous findings. Prior median sternotomy with nonunion. Review of the MIP images confirms the above findings. CT ABDOMEN PELVIS FINDINGS Hepatobiliary: No focal liver abnormality is seen. Status post cholecystectomy. No biliary dilatation. Pancreas: Unremarkable. No pancreatic ductal dilatation [name] surrounding inflammatory changes. Spleen: Normal in size without focal abnormality. Adrenals/Urinary Tract: Unchanged 1.2 cm right and 1.8 cm left adrenal nodules previously characterized as adenomas. No follow-up imaging is recommended. No renal calculi, mass, [name] hydronephrosis. The bladder is unremarkable. Stomach/Bowel: Stomach is within normal limits. Appendix appears normal. No evidence of bowel wall thickening, distention, [name] inflammatory changes. Mild sigmoid colonic diverticulosis. Vascular/Lymphatic: Aortic atherosclerosis. No enlarged abdominal [name] pelvic lymph nodes. Reproductive: Uterus and bilateral adnexa are unremarkable. Other: No abdominal wall hernia [name] abnormality. No abdominopelvic ascites. No pneumoperitoneum. Musculoskeletal: No acute [name] significant osseous findings. Review of the MIP images confirms the above findings. IMPRESSION: CT chest: 1. No evidence of pulmonary embolism. 2. Ill-defined tiny centrilobular ground-glass densities throughout both lungs with mild mosaic attenuation. Differential considerations include  respiratory bronchiolitis and hypersensitivity pneumonitis. 3.  Aortic Atherosclerosis (ICD10-I70.0). CT abdomen pelvis: 1.  No acute intra-abdominal process. Electronically Signed   By: Obie Dredge M.D.   On: 01/26/2023 13:49   DG Chest Portable 1 View  Result Date: 01/26/2023 CLINICAL DATA:  Chest pain EXAM: PORTABLE CHEST 1 VIEW COMPARISON:  11/02/2022 FINDINGS: Cardiomegaly status post median sternotomy with left atrial appendage clip. Mild diffuse bilateral interstitial opacity. Osseous structures unremarkable. IMPRESSION: Cardiomegaly with mild diffuse bilateral interstitial opacity, consistent with edema [name] atypical/viral infection. No focal airspace opacity. Electronically Signed   By: Jearld Lesch M.D.   On: 01/26/2023 12:42    IMPRESSION: Left upper quadrant abdominal pain Epigastric abdominal pain Atypical chest pain Coronary artery disease status post triple-vessel CABG in March 2024 Atrial fibrillation Dysphagia, chronic Tobacco use Rheumatoid arthritis  PLAN: -Continue pantoprazole -Continue sucralfate -Consider EGD to further evaluate significant epigastric and left upper quadrant pain, request formal cardiac evaluation given triple-vessel CABG in March 2024 -Okay for diet today from GI perspective -If cardiology evaluates and no further cardiac workup, can plan for EGD on 01/28/2023 -Discussed EGD procedure in detail with patient including benefits, alternatives, risks of bleeding/infection/perforation/missed lesion/anesthesia, she verbalized understanding and elected to proceed if appropriate -[facility] will follow   LOS: 0 days   Liliane Shi, [facility]

## 2023-01-28 ENCOUNTER — Encounter (HOSPITAL_COMMUNITY)
Admission: EM | Disposition: A | Discharge status: Home / Self Care | Payer: Self-pay | Source: Home / Self Care | Attending: Emergency Medicine

## 2023-01-28 ENCOUNTER — Observation Stay (HOSPITAL_COMMUNITY): Payer: BC Managed Care – PPO | Admitting: Anesthesiology

## 2023-01-28 DIAGNOSIS — R1012 Left upper quadrant pain: Secondary | ICD-10-CM | POA: Diagnosis not present

## 2023-01-28 HISTORY — PX: ESOPHAGOGASTRODUODENOSCOPY: SHX5428

## 2023-01-28 HISTORY — PX: BIOPSY: SHX5522

## 2023-01-28 SURGERY — EGD (ESOPHAGOGASTRODUODENOSCOPY)
Anesthesia: Monitor Anesthesia Care

## 2023-01-28 MED ORDER — LEVOFLOXACIN 750 MG PO TABS: 750.0000 mg | ORAL_TABLET | Freq: Every day | ORAL | 0 refills | Status: AC

## 2023-01-28 MED ORDER — PROPOFOL 500 MG/50ML IV EMUL
INTRAVENOUS | Status: DC | PRN
  Administered 2023-01-28 (×3): 40 mg via INTRAVENOUS
  Administered 2023-01-28: 100 ug/kg/min via INTRAVENOUS
  Administered 2023-01-28: 40 mg via INTRAVENOUS

## 2023-01-28 MED ORDER — LACTATED RINGERS IV SOLN: INTRAVENOUS | Status: DC

## 2023-01-28 MED ORDER — SUCRALFATE 1 GM/10ML PO SUSP: 1.0000 g | Freq: Three times a day (TID) | ORAL | 0 refills | Status: DC

## 2023-01-28 MED ORDER — PANTOPRAZOLE SODIUM 40 MG PO TBEC: 40.0000 mg | DELAYED_RELEASE_TABLET | Freq: Every day | ORAL | 1 refills | Status: DC

## 2023-01-28 MED ORDER — NICOTINE 21 MG/24HR TD PT24: 21.0000 mg | MEDICATED_PATCH | Freq: Every day | TRANSDERMAL | 0 refills | Status: DC

## 2023-01-28 MED ORDER — OXYCODONE HCL 5 MG PO TABS: 5.0000 mg | ORAL_TABLET | Freq: Four times a day (QID) | ORAL | 0 refills | Status: DC | PRN

## 2023-01-28 MED ORDER — LACTATED RINGERS IV SOLN: INTRAVENOUS | Status: DC | PRN

## 2023-01-28 NOTE — Progress Notes (Signed)
   01/28/23 1200  SLP Visit Information  SLP Received On 01/28/23  Reason Eval/Treat Not Completed Other (comment) (pt having endoscopy at this time, will continue efforts)   Janet Infante, MS [facility] SLP Acute Rehab Services Office [phone]

## 2023-01-28 NOTE — Anesthesia Preprocedure Evaluation (Signed)
Anesthesia Evaluation  Patient identified by MRN, date of birth, ID band Patient awake    Reviewed: Allergy & Precautions, NPO status , Patient's Chart, lab work & pertinent test results  Airway Mallampati: II  TM Distance: >3 FB Neck ROM: Full    Dental no notable dental hx.    Pulmonary asthma , Current Smoker   Pulmonary exam normal        Cardiovascular hypertension, Pt. on medications and Pt. on home beta blockers + dysrhythmias Atrial Fibrillation  Rhythm:Regular Rate:Normal  ECHO:   1. Left ventricular ejection fraction, by estimation, is 55 to 60%. Left ventricular ejection fraction by PLAX is 59 %. The left ventricle has normal function. The left ventricle demonstrates regional wall motion abnormalities (see scoring diagram/findings for description). There is mild left ventricular hypertrophy. Left ventricular diastolic parameters are consistent with Grade I diastolic dysfunction (impaired relaxation). There is moderate hypokinesis of the left ventricular, basal-mid  septal wall.  2. Right ventricular systolic function is moderately reduced. The right ventricular size is normal. Tricuspid regurgitation signal is inadequate for assessing PA pressure.  3. The mitral valve is abnormal. Trivial mitral valve regurgitation.  4. The aortic valve is tricuspid. Aortic valve regurgitation is not visualized. Aortic valve sclerosis is present, with no evidence of aortic valve stenosis.  5. Aortic dilatation noted. There is borderline dilatation of the ascending aorta, measuring 37 mm.  6. The inferior vena cava is normal in size with greater than 50% respiratory variability, suggesting right atrial pressure of 3 mmHg.  7. Cannot exclude a small PFO.   Comparison(s): Changes from prior study are noted. 10/08/2022: LVEF 35-40%.      Neuro/Psych   Anxiety     negative neurological ROS     GI/Hepatic Neg liver ROS,GERD  Medicated,,   Endo/Other  negative endocrine ROS    Renal/GU negative Renal ROS  negative genitourinary   Musculoskeletal  (+) Arthritis ,  Fibromyalgia -  Abdominal Normal abdominal exam  (+)   Peds  Hematology  (+) Blood dyscrasia, anemia   Anesthesia Other Findings   Reproductive/Obstetrics                             Anesthesia Physical Anesthesia Plan  ASA: 3  Anesthesia Plan: MAC   Post-op Pain Management:    Induction: Intravenous  PONV Risk Score and Plan: 1 and Propofol infusion and Treatment may vary due to age or medical condition  Airway Management Planned: Simple Face Mask and Nasal Cannula  Additional Equipment: None  Intra-op Plan:   Post-operative Plan:   Informed Consent: I have reviewed the patients History and Physical, chart, labs and discussed the procedure including the risks, benefits and alternatives for the proposed anesthesia with the patient or authorized representative who has indicated his/her understanding and acceptance.     Dental advisory given  Plan Discussed with: CRNA  Anesthesia Plan Comments:        Anesthesia Quick Evaluation

## 2023-01-28 NOTE — Transfer of Care (Signed)
Immediate Anesthesia Transfer of Care Note  Patient: [name]  Procedure(s) Performed: ESOPHAGOGASTRODUODENOSCOPY (EGD) BIOPSY  Patient Location: PACU  Anesthesia Type:MAC  Level of Consciousness: awake and alert   Airway & Oxygen Therapy: Patient Spontanous Breathing  Post-op Assessment: Report given to RN  Post vital signs: Reviewed and stable  Last Vitals:  Vitals Value Taken Time  BP 127/73 01/28/23 1324  Temp    Pulse 94 01/28/23 1326  Resp 23 01/28/23 1326  SpO2 90 % 01/28/23 1326  Vitals shown include unfiled device data.  Last Pain:  Vitals:   01/28/23 1216  TempSrc: Temporal  PainSc: 3       Patients Stated Pain Goal: 3 (01/27/23 0211)  Complications: No notable events documented.

## 2023-01-28 NOTE — Evaluation (Signed)
SLP  Note  Patient Details Name: [name] MRN: [id number] DOB: [birth year]   Cancelled treatment:       Reason Eval/Treat Not Completed: Other (comment) (RN reports pt had severe gastritis diagnosed post-endoscopy, GI recommending regular/thin diet.  Will sign off, please reconsult if indicated. Thanks.) Rolena Infante, MS [facility] SLP Acute Rehab Services Office [phone]   Chales Abrahams 01/28/2023, 2:42 PM

## 2023-01-28 NOTE — Interval H&P Note (Signed)
History and Physical Interval Note:  01/28/2023 12:33 PM  [name]  has presented today for surgery, with the diagnosis of LUQ abdominal pain.  The various methods of treatment have been discussed with the patient and family. After consideration of risks, benefits and other options for treatment, the patient has consented to  Procedure(s): ESOPHAGOGASTRODUODENOSCOPY (EGD) (N/A) as a surgical intervention.  The patient's history has been reviewed, patient examined, no change in status, stable for surgery.  I have reviewed the patient's chart and labs.  Questions were answered to the patient's satisfaction.     Lynann Bologna

## 2023-01-28 NOTE — Progress Notes (Addendum)
Rounding Note    Patient Name: [name] Date of Encounter: 01/28/2023  [facility] Cardiologist: Verne Carrow, MD   Subjective   No acute overnight events. Continues to have some left upper chest cramping when she lays on her left side as well as left upper quadrant pain (although medications are helping with that). No significant shortness of breath overnight. BNP normal. Plan is for EGD later today.  Inpatient Medications    Scheduled Meds:  ascorbic acid  500 mg Oral Daily   atorvastatin  80 mg Oral Daily   cyanocobalamin  1,000 mcg Oral Daily   doxycycline  100 mg Oral Q12H   ferrous sulfate  325 mg Oral Daily   heparin  5,000 Units Subcutaneous Q8H   methocarbamol  500 mg Oral TID   metoprolol succinate  25 mg Oral Daily   nicotine  21 mg Transdermal Daily   pantoprazole (PROTONIX) IV  40 mg Intravenous Q12H   predniSONE  40 mg Oral Q breakfast   sucralfate  1 g Oral TID WC & HS   Continuous Infusions:  levofloxacin (LEVAQUIN) IV 750 mg (01/27/23 2332)   PRN Meds: albuterol, HYDROmorphone (DILAUDID) injection, lip balm, melatonin, nitroGLYCERIN, ondansetron **OR** ondansetron (ZOFRAN) IV, oxyCODONE, pregabalin   Vital Signs    Vitals:   01/27/23 1347 01/27/23 1728 01/27/23 2022 01/28/23 0439  BP: 128/87 (!) 140/89 (!) 146/93 132/85  Pulse: 97 97 (!) 110 95  Resp: 14 14 18 18   Temp: 98.9 F (37.2 C) 98.5 F (36.9 C) 98.2 F (36.8 C) 98.4 F (36.9 C)  TempSrc: Oral Oral Oral Oral  SpO2: 93% 94% 96% 97%  Height:        Intake/Output Summary (Last 24 hours) at 01/28/2023 1103 Last data filed at 01/28/2023 0600 Gross per 24 hour  Intake 1387.36 ml  Output 2800 ml  Net -1412.64 ml      01/04/2023    1:36 PM 11/16/2022    3:20 PM 11/04/2022    9:17 AM  Last 3 Weights  Weight (lbs) 177 lb 176 lb 4 oz 174 lb  Weight (kg) 80.287 kg 79.946 kg 78.926 kg      Telemetry    Sinus rhythm with rates in the 90s to low 100s. -  Personally Reviewed  ECG    No new ECG tracing today. - Personally Reviewed  Physical Exam   GEN: No acute distress.   Neck: No JVD. Cardiac: RRR. No murmurs, rubs, or gallops.  Respiratory: No increased work of breathing. Course crackles noted in very bottom of bilateral bases (suspect atelectasis). GI: Soft, non-distended, and non-tender. MS: No lower extremity edema. No deformity. Skin: Warm and dry. Neuro:  No focal deficits. Psych: Normal affect. Responds appropriately.   Labs    High Sensitivity Troponin:   Recent Labs  Lab 01/26/23 1106 01/26/23 1236  TROPONINIHS 14 12     Chemistry Recent Labs  Lab 01/26/23 1106 01/26/23 1124 01/26/23 2108 01/27/23 0051  NA 138  --   --  134*  K 4.2  --   --  4.1  CL 102  --   --  100  CO2 28  --   --  26  GLUCOSE 102*  --   --  103*  BUN 8  --   --  5*  CREATININE 0.63  --  0.64 0.65  CALCIUM 9.1  --   --  8.6*  PROT  --  7.3  --  6.2*  ALBUMIN  --  4.2  --  3.3*  AST  --  13*  --  18  ALT  --  11  --  15  ALKPHOS  --  129*  --  134*  BILITOT  --  0.3  --  0.5  GFRNONAA >60  --  >60 >60  ANIONGAP 8  --   --  8    Lipids No results for input(s): "CHOL", "TRIG", "HDL", "LABVLDL", "LDLCALC", "CHOLHDL" in the last 168 hours.  Hematology Recent Labs  Lab 01/26/23 1106 01/26/23 2108 01/27/23 0051  WBC 11.3* 10.2 9.5  RBC 4.75 4.43 4.12  HGB 13.5 12.6 11.9*  HCT 41.2 39.3 37.8  MCV 86.7 88.7 91.7  MCH 28.4 28.4 28.9  MCHC 32.8 32.1 31.5  RDW 14.2 14.4 14.5  PLT 368 315 291   Thyroid No results for input(s): "TSH", "FREET4" in the last 168 hours.  BNP Recent Labs  Lab 01/27/23 1408  BNP 53.5    DDimer No results for input(s): "DDIMER" in the last 168 hours.   Radiology    DG Shoulder Left  Result Date: 01/27/2023 CLINICAL DATA:  Shoulder pain EXAM: LEFT SHOULDER - 2+ VIEW COMPARISON:  None Available. FINDINGS: No fracture or malalignment. Mild AC joint and glenohumeral degenerative change. Probable  bone island in the proximal left humerus. IMPRESSION: Mild degenerative change. Electronically Signed   By: Jasmine Pang M.D.   On: 01/27/2023 18:52   CT Angio Chest PE W and/or Wo Contrast  Result Date: 01/26/2023 CLINICAL DATA:  Intermittent left-sided rib pain for the past few weeks. EXAM: CT ANGIOGRAPHY CHEST CT ABDOMEN AND PELVIS WITH CONTRAST TECHNIQUE: Multidetector CT imaging of the chest was performed using the standard protocol during bolus administration of intravenous contrast. Multiplanar CT image reconstructions and MIPs were obtained to evaluate the vascular anatomy. Multidetector CT imaging of the abdomen and pelvis was performed using the standard protocol during bolus administration of intravenous contrast. RADIATION DOSE REDUCTION: This exam was performed according to the departmental dose-optimization program which includes automated exposure control, adjustment of the mA and/or kV according to patient size and/or use of iterative reconstruction technique. CONTRAST:  OMNIPAQUE IOHEXOL 350 MG/ML SOLN COMPARISON:  CT abdomen pelvis dated October 02, 2021. FINDINGS: CTA CHEST FINDINGS Cardiovascular: Satisfactory opacification of the pulmonary arteries to the segmental level. No evidence of pulmonary embolism. Mild cardiomegaly status post CABG and left atrial appendage clipping. Trace pericardial effusion. No thoracic aortic aneurysm or dissection. Coronary, aortic arch, and branch vessel atherosclerotic vascular disease. Mediastinum/Nodes: No enlarged mediastinal, hilar, or axillary lymph nodes. Thyroid gland, trachea, and esophagus demonstrate no significant findings. Lungs/Pleura: Ill-defined tiny centrilobular ground-glass densities throughout both lungs. Mild mosaic attenuation. No focal consolidation, pleural effusion, or pneumothorax. Musculoskeletal: No chest wall abnormality. No acute or significant osseous findings. Prior median sternotomy with nonunion. Review of the MIP images  confirms the above findings. CT ABDOMEN PELVIS FINDINGS Hepatobiliary: No focal liver abnormality is seen. Status post cholecystectomy. No biliary dilatation. Pancreas: Unremarkable. No pancreatic ductal dilatation or surrounding inflammatory changes. Spleen: Normal in size without focal abnormality. Adrenals/Urinary Tract: Unchanged 1.2 cm right and 1.8 cm left adrenal nodules previously characterized as adenomas. No follow-up imaging is recommended. No renal calculi, mass, or hydronephrosis. The bladder is unremarkable. Stomach/Bowel: Stomach is within normal limits. Appendix appears normal. No evidence of bowel wall thickening, distention, or inflammatory changes. Mild sigmoid colonic diverticulosis. Vascular/Lymphatic: Aortic atherosclerosis. No enlarged abdominal or pelvic lymph nodes. Reproductive:  Uterus and bilateral adnexa are unremarkable. Other: No abdominal wall hernia or abnormality. No abdominopelvic ascites. No pneumoperitoneum. Musculoskeletal: No acute or significant osseous findings. Review of the MIP images confirms the above findings. IMPRESSION: CT chest: 1. No evidence of pulmonary embolism. 2. Ill-defined tiny centrilobular ground-glass densities throughout both lungs with mild mosaic attenuation. Differential considerations include respiratory bronchiolitis and hypersensitivity pneumonitis. 3.  Aortic Atherosclerosis (ICD10-I70.0). CT abdomen pelvis: 1.  No acute intra-abdominal process. Electronically Signed   By: Obie Dredge M.D.   On: 01/26/2023 13:49   CT ABDOMEN PELVIS W CONTRAST  Result Date: 01/26/2023 CLINICAL DATA:  Intermittent left-sided rib pain for the past few weeks. EXAM: CT ANGIOGRAPHY CHEST CT ABDOMEN AND PELVIS WITH CONTRAST TECHNIQUE: Multidetector CT imaging of the chest was performed using the standard protocol during bolus administration of intravenous contrast. Multiplanar CT image reconstructions and MIPs were obtained to evaluate the vascular anatomy.  Multidetector CT imaging of the abdomen and pelvis was performed using the standard protocol during bolus administration of intravenous contrast. RADIATION DOSE REDUCTION: This exam was performed according to the departmental dose-optimization program which includes automated exposure control, adjustment of the mA and/or kV according to patient size and/or use of iterative reconstruction technique. CONTRAST:  OMNIPAQUE IOHEXOL 350 MG/ML SOLN COMPARISON:  CT abdomen pelvis dated October 02, 2021. FINDINGS: CTA CHEST FINDINGS Cardiovascular: Satisfactory opacification of the pulmonary arteries to the segmental level. No evidence of pulmonary embolism. Mild cardiomegaly status post CABG and left atrial appendage clipping. Trace pericardial effusion. No thoracic aortic aneurysm or dissection. Coronary, aortic arch, and branch vessel atherosclerotic vascular disease. Mediastinum/Nodes: No enlarged mediastinal, hilar, or axillary lymph nodes. Thyroid gland, trachea, and esophagus demonstrate no significant findings. Lungs/Pleura: Ill-defined tiny centrilobular ground-glass densities throughout both lungs. Mild mosaic attenuation. No focal consolidation, pleural effusion, or pneumothorax. Musculoskeletal: No chest wall abnormality. No acute or significant osseous findings. Prior median sternotomy with nonunion. Review of the MIP images confirms the above findings. CT ABDOMEN PELVIS FINDINGS Hepatobiliary: No focal liver abnormality is seen. Status post cholecystectomy. No biliary dilatation. Pancreas: Unremarkable. No pancreatic ductal dilatation or surrounding inflammatory changes. Spleen: Normal in size without focal abnormality. Adrenals/Urinary Tract: Unchanged 1.2 cm right and 1.8 cm left adrenal nodules previously characterized as adenomas. No follow-up imaging is recommended. No renal calculi, mass, or hydronephrosis. The bladder is unremarkable. Stomach/Bowel: Stomach is within normal limits. Appendix appears  normal. No evidence of bowel wall thickening, distention, or inflammatory changes. Mild sigmoid colonic diverticulosis. Vascular/Lymphatic: Aortic atherosclerosis. No enlarged abdominal or pelvic lymph nodes. Reproductive: Uterus and bilateral adnexa are unremarkable. Other: No abdominal wall hernia or abnormality. No abdominopelvic ascites. No pneumoperitoneum. Musculoskeletal: No acute or significant osseous findings. Review of the MIP images confirms the above findings. IMPRESSION: CT chest: 1. No evidence of pulmonary embolism. 2. Ill-defined tiny centrilobular ground-glass densities throughout both lungs with mild mosaic attenuation. Differential considerations include respiratory bronchiolitis and hypersensitivity pneumonitis. 3.  Aortic Atherosclerosis (ICD10-I70.0). CT abdomen pelvis: 1.  No acute intra-abdominal process. Electronically Signed   By: Obie Dredge M.D.   On: 01/26/2023 13:49   DG Chest Portable 1 View  Result Date: 01/26/2023 CLINICAL DATA:  Chest pain EXAM: PORTABLE CHEST 1 VIEW COMPARISON:  11/02/2022 FINDINGS: Cardiomegaly status post median sternotomy with left atrial appendage clip. Mild diffuse bilateral interstitial opacity. Osseous structures unremarkable. IMPRESSION: Cardiomegaly with mild diffuse bilateral interstitial opacity, consistent with edema or atypical/viral infection. No focal airspace opacity. Electronically Signed   By: Jearld Lesch  M.D.   On: 01/26/2023 12:42    Cardiac Studies   Echocardiogram 07/20/2022: Impressions:  1. Left ventricular ejection fraction, by estimation, is 35 to 40%. The  left ventricle has moderately decreased function. The left ventricle  demonstrates regional wall motion abnormalities (see scoring  diagram/findings for description). The left  ventricular internal cavity size was mildly dilated. Left ventricular  diastolic parameters are indeterminate. Mild global hypokinesis with  moderate hypokinesis of septal wall.   2. Right  ventricular systolic function is normal. The right ventricular  size is normal.   3. Left atrial size was moderately dilated.   4. The mitral valve is normal in structure. Mild to moderate mitral valve  regurgitation. No evidence of mitral stenosis.   5. The aortic valve is grossly normal. There is mild calcification of the  aortic valve. Aortic valve regurgitation is not visualized. Aortic valve  sclerosis is present, with no evidence of aortic valve stenosis.   6. The inferior vena cava is normal in size with greater than 50%  respiratory variability, suggesting right atrial pressure of 3 mmHg.   7. Cannot exclude a small PFO.  _______________   Monitor 07/2022: Sinus.  4 Ventricular Tachycardia runs occurred, the longest was 13 beats.  3 Supraventricular Tachycardia runs occurred, the longest was 7 beats  Rare premature atrial contractions.  Rare premature ventricular contractions. _______________   Right/ Left Cardiac Catheterization 09/07/2022: Conclusions: Multivessel coronary artery disease, as detailed below.  Most significant disease involves the mid LAD, where sequential 90% and 70% lesions flank an aneurysmal segment where a large D2 branch arises from.  The LAD lesions are highly significant by RFR (RFR 0.46).  D2 also contains a 70% proximal stenosis.  LCx and RCA demonstrate non-obstructive coronary artery disease (RFR mid RCA 0.94). Upper normal to mildly elevated left heart filling pressures (LVEDP 17 mmHg, PCWP 13 mmHg). Normal right heart and pulmonary artery pressures (mean RA 6 mmHg, mean PAP 18 mmHg). Mildly reduced Fick cardiac output/index (CO 4.0 L/min, CI 2.2 L/min/m^2).   Recommendations: Given complex anatomy of LAD/D2 disease, recommend cardiac surgery consultation and [facility] discussion regarding optimal management strategy. Discontinue diltiazem and start metoprolol succinate 25 mg daily in the setting of cardiomyophathy. Restart apixaban tomorrow morning  if no evidence of bleeding/vascular complications at catheterization sites. Aggressive secondary prevention of coronary artery disease.  Consider escalation of statin therapy to high-intensity dosing at follow-up. _______________   Echocardiogram 12/18/2022: Impressions:  1. Left ventricular ejection fraction, by estimation, is 55 to 60%. Left  ventricular ejection fraction by PLAX is 59 %. The left ventricle has  normal function. The left ventricle demonstrates regional wall motion  abnormalities (see scoring  diagram/findings for description). There is mild left ventricular  hypertrophy. Left ventricular diastolic parameters are consistent with  Grade I diastolic dysfunction (impaired relaxation). There is moderate  hypokinesis of the left ventricular, basal-mid   septal wall.   2. Right ventricular systolic function is moderately reduced. The right  ventricular size is normal. Tricuspid regurgitation signal is inadequate  for assessing PA pressure.   3. The mitral valve is abnormal. Trivial mitral valve regurgitation.   4. The aortic valve is tricuspid. Aortic valve regurgitation is not  visualized. Aortic valve sclerosis is present, with no evidence of aortic  valve stenosis.   5. Aortic dilatation noted. There is borderline dilatation of the  ascending aorta, measuring 37 mm.   6. The inferior vena cava is normal in size with greater than 50%  respiratory variability, suggesting right atrial pressure of 3 mmHg.   7. Cannot exclude a small PFO.   Comparison(s): Changes from prior study are noted. 10/08/2022: LVEF 35-40%.   Patient Profile     52 y.o. female with a history of CAD s/p CABG x3 (LIMA-LAD, SVG-Diag, SVG-OM1) in 09/2022, ischemic cardiomyopathy with EF of 35-40% on Echo in 07/2022 but improved to 55-60% on repeat Echo in 12/2022, palpitations with short runs of both NSVT and SVT as well as rare PACs/ PVCs noted on monitor in 07/2022, hyperlipidemia, GERD, rheumatoid arthritis,  fibromyalgia, and anxiety who is being seen 01/27/2023 for pre-op evaluation at the request of Dr. Lorenso Quarry (GI).   Assessment & Plan    Pre-Op Evaluation Patient presented with severe left upper quadrant/epigastric pain rating up into her chest.  GI was consulted and is recommending EGD.  Cardiology consulted for preop evaluation for CABG in 09/2022. She reports some dyspnea on exertion and fatigue since her surgery but suspect this is due to deconditioning. No chest pain. Euvolemic on exam. BNP normal. Repeat Echo last month showed normalization of EF to 55-60%. Per Revised Cardiac Risk Index, considered Class II Risk with 6.0% chance of 30-day risks of death, MI, or cardiac arrest. No additional cardiac work-up necessary prior to low risk GI procedure.   CAD s/p CABG S/p CABG x 3 in 09/2022.  High-sensitivity troponin negative x 2 this admission. - No chest pain. - She does not appear to be on a Aspirin. Recommend starting 81mg  daily. Can do this after GI work-up. - Continue beta-blocker and statin.  - She never started Cardiac Rehab. Would recommend this after current admission. Will place another referral.   Ischemic Cardiomyopathy LVEF 35-40% on Echo in 07/2022 but normalized after CABG. Most recent Echo in 12/2022 showed LVEF of 55-60%  with moderate hypokinesis of the basal mid septal wall and grade 1 diastolic dysfunction. BNP normal. - Mild crackles on exam. However, she is being treated for possible atypical pneumonia/ chronic aspiration. Also recommended incentive spirometer. - Euvolemic on exam. - Only on PO Lasix as needed at home. No need for diuretics at this time.  - GDMT has been limited due to soft BP (required Midodrine after CABG). Continue Toprol-XL 25mg  daily.    Palpitations Possible Atrial Fibrillation Patient has a history of palpitations. She reportedly had an EKG done at a fire department at the end of 2023 which showed atrial fibrillation but we don't have any records  from this. Monitor in 07/2022 showed short runs of NSVT and SVT as well as rare PACs/ PVCs but no atrial fibrillation. She was briefly on Eliquis but this was stopped after CABG due to acute blood loss anemia requiring a blood transfusion. Decision made not to restart Eliquis unless she has recurrent documentation of atrial fibrillation.  - Maintaining sinus rhythm. Rates currently in the 90s to low 100s. No evidence of atrial fibrillation. - Continue Toprol-XL 25mg  daily.  Can increase if needed for additional heart rate control but suspect abdominal pain is driving higher rates. She previously has had issues with low BP (required Midodrine after CABG) but BP is fine this admission (110-140s/70-90s).   Hyperlipidemia - Continue Lipitor 80mg  daily.  Tobacco Abuse She has been counseled on importance of complete cessation.   Otherwise, per primary team: - Severe abdominal pain - GERD - ADHD - Anxiety - Rheumatoid arthritis - Fibromyalgia  For questions or updates, please contact [facility] Please consult [email or web address] for contact info  under        Signed, Corrin Parker, PA-C  01/28/2023, 11:03 AM    Personally seen and examined. Agree with APP above with the following comments:  Briefly 52 year old smoker with prior ischemic cardiomyopathy who recovered after CABG, current smoker (relapsed) planned for EGD.  Patient notes persistent abdominal pain.  She is using her incentive spirometry. NO CP,no SOB, no Palpitations, no syncope.  Exam notable for persistent isolated crackles in the bases. BNP normal Tele: SR with sinus tachycardia  Would recommend  - smoking cessation and re-referral to cardiac rehab - EGD is reasonable, no cardiac concerns to proceed. - Cardiology will sign off, arranging outpatient f/u   Riley Lam, MD FASE [facility] Cardiologist [facility]  [address] [place], [address] [place], [place] [address] [phone]  11:44  AM

## 2023-01-28 NOTE — Op Note (Signed)
[facility] Patient Name: [name] Procedure Date: 01/28/2023 MRN: [id number] Attending MD: Liliane Shi DO, DO, [id number] Date of Birth: 1970-09-11 CSN: [id number] Age: 52 Admit Type: Inpatient Procedure:                Upper GI endoscopy Indications:              Epigastric abdominal pain, Abdominal pain in the                            left upper quadrant Providers:                Liliane Shi DO, DO, Marge Duncans, RN, Beryle Beams, Technician Referring MD:              Medicines:                See the Anesthesia note for documentation of the                            administered medications Complications:            No immediate complications. Estimated Blood Loss:     Estimated blood loss was minimal. Procedure:                Pre-Anesthesia Assessment:                           - ASA Grade Assessment: III - A patient with severe                            systemic disease.                           - The risks and benefits of the procedure and the                            sedation options and risks were discussed with the                            patient. All questions were answered and informed                            consent was obtained.                           After obtaining informed consent, the endoscope was                            passed under direct vision. Throughout the                            procedure, the patient's blood pressure, pulse, and                            oxygen saturations were monitored continuously. The  [id number] ([id number]) Olympus endoscope was introduced                            through the mouth, and advanced to the second part                            of duodenum. The upper GI endoscopy was                            accomplished without difficulty. The patient                            tolerated the procedure well. Scope In: Scope  Out: Findings:      The Z-line was regular and was found 40 cm from the incisors. Biopsies       were obtained from the proximal and distal esophagus with cold forceps       for histology of suspected eosinophilic esophagitis.      Scattered moderate inflammation characterized by congestion (edema),       erosions, erythema and friability was found in the gastric antrum.       Biopsies were taken with a cold forceps for Helicobacter pylori testing.      Patchy mildly erythematous mucosa without active bleeding and with no       stigmata of bleeding was found in the duodenal bulb. Impression:               - Z-line regular, 40 cm from the incisors.                           - Gastritis. Biopsied.                           - Erythematous duodenopathy.                           - Biopsies were taken with a cold forceps for                            evaluation of eosinophilic esophagitis. Moderate Sedation:      See the other procedure note for documentation of moderate sedation with       intraservice time. Recommendation:           - Return patient to hospital ward for ongoing care.                           - Resume regular diet.                           - Continue present medications.                           - Await pathology results.                           - Discussed with Internal Medicine physician. Procedure Code(s):        --- Professional ---  78295, Esophagogastroduodenoscopy, flexible,                            transoral; with biopsy, single or multiple Diagnosis Code(s):        --- Professional ---                           K29.70, Gastritis, unspecified, without bleeding                           K31.89, Other diseases of stomach and duodenum                           R10.13, Epigastric pain                           R10.12, Left upper quadrant pain CPT copyright 2022 [employer]. All rights reserved. The codes documented in  this report are preliminary and upon coder review may  be revised to meet current compliance requirements. Dr Liliane Shi, DO Liliane Shi DO, DO 01/28/2023 1:28:16 PM Number of Addenda: 0

## 2023-01-28 NOTE — Discharge Summary (Addendum)
Physician Discharge Summary   Patient: [name] MRN: [id number] DOB: [birth year]  Admit date:     01/26/2023  Discharge date: 01/28/23  Discharge Physician: Meredeth Ide   PCP: Noberto Retort, MD   Recommendations at discharge:   Follow-up gastroenterology as outpatient  Discharge Diagnoses: Principal Problem:   Left upper quadrant abdominal pain  Resolved Problems:   * No resolved hospital problems. *  Hospital Course: 52 year old female with medical history of CAD s/p CABG x 3, pulmonary vein isolation ablation and ligation of left atrial appendage on 09/18/2022, atrial fibrillation, ischemic cardiomyopathy, anxiety, fibromyalgia, GERD, arthritis, diverticulitis/colitis, tobacco abuse came to med [facility] with complaint of left upper quadrant abdominal pain with radiation to her chest.  Patient said that pain started about 2 to 3 weeks ago, worse on deep breathing. In the ED CTA chest as well as CT abdomen/pelvis was unremarkable, showed ill-defined tiny centrilobular groundglass densities throughout both lungs and mild mosaic attenuation with differential considerations including hypersensitivity pneumonitis, respiratory bronchiolitis.  Did not show any acute intra-abdominal process   Assessment and Plan:  Left upper quadrant pain/gastritis -Underwent EGD which showed antral gastritis -Started on Carafate 1 g p.o. 4 times daily for 14 days and Protonix 40 mg daily -Biopsies obtained, follow-up gastroenterology, Dr. Lavonia Drafts as outpatient  Left shoulder pain -Neer's sign positive; concern for rotator cuff impingement syndrome -X-ray shoulder showed mild degenerative changes  Atypical pneumonia -Continue Levaquin for 3 more days   CAD s/p three-vessel CABG -Cardiology consulted for preop evaluation -Troponin x 2 were flat -Continue atorvastatin, metoprolol   Hyperlipidemia -Continue statin   Tobacco abuse - continue nicotine patch   ADHD -continue  Adderall   Atrial fibrillation  -s/p pulmonary vein isolation ablation and ligation of the left atrial appendage 10/08/22    Anxiety  -not currently on outpatient medication            Consultants: Gastroenterology Procedures performed: EGD Disposition: Home Diet recommendation:  Discharge Diet Orders (From admission, onward)     Start     Ordered   01/28/23 0000  Diet - low sodium heart healthy        01/28/23 1528           Regular diet DISCHARGE MEDICATION: Allergies as of 01/28/2023       Reactions   Zoloft [sertraline] Other (See Comments)   Altered mental state        Medication List     STOP taking these medications    omeprazole 20 MG capsule Commonly known as: PRILOSEC   Vitamin C 500 MG Caps       TAKE these medications    albuterol 108 (90 Base) MCG/ACT inhaler Commonly known as: VENTOLIN HFA Inhale 2 puffs into the lungs every 4 (four) hours as needed for wheezing or shortness of breath.   amphetamine-dextroamphetamine 30 MG 24 hr capsule Commonly known as: ADDERALL XR Take 60 mg by mouth daily.   atorvastatin 80 MG tablet Commonly known as: LIPITOR Take 1 tablet (80 mg total) by mouth daily.   cyanocobalamin 1000 MCG tablet Commonly known as: VITAMIN B12 Take 1,000 mcg by mouth daily.   escitalopram 10 MG tablet Commonly known as: LEXAPRO Take 10 mg by mouth daily as needed (anxiety).   ferrous sulfate 325 (65 FE) MG EC tablet Take 325 mg by mouth daily.   furosemide 20 MG tablet Commonly known as: LASIX Take 1 tablet (20 mg total) by mouth daily as  needed (for lower extremity edema).   gabapentin 100 MG capsule Commonly known as: NEURONTIN Take 100-200 mg by mouth 4 (four) times daily as needed (pain).   gabapentin 300 MG capsule Commonly known as: NEURONTIN Take 600 mg by mouth daily as needed (pain).   levofloxacin 750 MG tablet Commonly known as: Levaquin Take 1 tablet (750 mg total) by mouth daily for 3  days. Start taking on: January 29, 2023   metoprolol succinate 25 MG 24 hr tablet Commonly known as: TOPROL-XL Take 1 tablet (25 mg total) by mouth in the morning and at bedtime.   nicotine 21 mg/24hr patch Commonly known as: NICODERM CQ - dosed in mg/24 hours Place 1 patch (21 mg total) onto the skin daily.   nitroGLYCERIN 0.4 MG SL tablet Commonly known as: NITROSTAT Place 1 tablet (0.4 mg total) under the tongue every 5 (five) minutes as needed for chest pain.   oxyCODONE 5 MG immediate release tablet Commonly known as: Roxicodone Take 1 tablet (5 mg total) by mouth every 6 (six) hours as needed.   pantoprazole 40 MG tablet Commonly known as: Protonix Take 1 tablet (40 mg total) by mouth daily.   sucralfate 1 GM/10ML suspension Commonly known as: CARAFATE Take 10 mLs (1 g total) by mouth 4 (four) times daily -  with meals and at bedtime for 14 days.   VITAMIN D (CHOLECALCIFEROL) PO Take 1 tablet by mouth daily.   zolpidem 10 MG tablet Commonly known as: AMBIEN Take 10 mg by mouth at bedtime.        Follow-up Information     Kathleene Hazel, MD Follow up.   Specialty: Cardiology Why: Please keep follow-up visit with Dr. Clifton James which is already scheduled for 02/25/2023 at 3pm. Contact information: [address] [place] [address] [phone]         Charlott Rakes, MD. Schedule an appointment as soon as possible for a visit.   Specialty: Gastroenterology Why: Follow up biospy result Contact information: [address] [place] [address] [phone]                Discharge Exam: Ceasar Mons Weights   01/28/23 1216  Weight: 81.6 kg   General-appears in no acute distress Heart-S1-S2, regular, no murmur auscultated Lungs-clear to auscultation bilaterally, no wheezing or crackles auscultated Abdomen-soft, nontender, no organomegaly Extremities-no edema in the lower extremities Neuro-alert, oriented x3, no  focal deficit noted  Condition at discharge: good  The results of significant diagnostics from this hospitalization (including imaging, microbiology, ancillary and laboratory) are listed below for reference.   Imaging Studies: DG Shoulder Left  Result Date: 01/27/2023 CLINICAL DATA:  Shoulder pain EXAM: LEFT SHOULDER - 2+ VIEW COMPARISON:  None Available. FINDINGS: No fracture or malalignment. Mild AC joint and glenohumeral degenerative change. Probable bone island in the proximal left humerus. IMPRESSION: Mild degenerative change. Electronically Signed   By: Jasmine Pang M.D.   On: 01/27/2023 18:52   CT CERVICAL SPINE WO CONTRAST  Result Date: 01/27/2023 CLINICAL DATA:  Previous cervical fusion.  Worsening neck pain. EXAM: CT CERVICAL SPINE WITHOUT CONTRAST TECHNIQUE: Multidetector CT imaging of the cervical spine was performed without intravenous contrast. Multiplanar CT image reconstructions were also generated. RADIATION DOSE REDUCTION: This exam was performed according to the departmental dose-optimization program which includes automated exposure control, adjustment of the mA and/or kV according to patient size and/or use of iterative reconstruction technique. COMPARISON:  Radiography 12/23/2022.  CT 12/23/2020. FINDINGS:  Alignment: 2 mm degenerative anterolisthesis at C3-4. Skull base and vertebrae: Previous ACDF C4 through C7 with solid union. No hardware complication. Soft tissues and spinal canal: Negative Disc levels: The foramen magnum is widely patent. There is ordinary mild osteoarthritis of the C1-2 articulation but no encroachment upon the neural structures. C2-3: Facet osteoarthritis on the left which has worsened since 2022. Mild left foraminal stenosis. C3-4: Facet osteoarthritis on the right that has worsened considerably since 2022. Anterolisthesis of 2 mm. Foraminal narrowing on the right that could affect the C4 nerve. C4 through C7: Previous ACDF with solid union and wide  patency of the canal and foramina. C7-T1: Mild facet osteoarthritis on the left. No canal or foraminal stenosis. T1-2: Facet osteoarthritis on the left. Mild left bony foraminal narrowing. Upper chest: Negative Other: None IMPRESSION: 1. Previous ACDF C4 through C7 with solid union and wide patency of the canal and foramina. 2. C2-3 facet arthritis on the left which has worsened since 2022 and could be painful. 3. C3-4 facet arthritis on the right which has worsened considerably since 2022 and could be painful. 2 mm of degenerative anterolisthesis because of this. Right foraminal stenosis. 4. Mild facet degeneration on the left at C7-T1 and T1-T2. Electronically Signed   By: Paulina Fusi M.D.   On: 01/27/2023 16:18   CT LUMBAR SPINE WO CONTRAST  Result Date: 01/27/2023 CLINICAL DATA:  Previous lumbar fusion.  Worsening low back pain. EXAM: CT LUMBAR SPINE WITHOUT CONTRAST TECHNIQUE: Multidetector CT imaging of the lumbar spine was performed without intravenous contrast administration. Multiplanar CT image reconstructions were also generated. RADIATION DOSE REDUCTION: This exam was performed according to the departmental dose-optimization program which includes automated exposure control, adjustment of the mA and/or kV according to patient size and/or use of iterative reconstruction technique. COMPARISON:  Radiography 12/23/2022.  MRI 08/15/2020. FINDINGS: Segmentation: 5 lumbar type vertebral bodies as numbered previously. Alignment: Chronic fixed anterolisthesis at L5-S1 of 5-6 mm. Minimal scoliotic curvature convex to the left. Vertebrae: No vertebral body finding at L4 or above. At L5-S1, there is previous anterior discectomy and fusion. Interbody spacer is in place without evidence of loosening. Apparent solid union across the disc space in the central portion. Small amount of nitrogen gas in the left portion of the disc space. Sclerotic change of the L5 and S1 vertebral bodies. Paraspinal and other soft  tissues: Negative except for aortic atherosclerotic calcification but no aneurysm. Disc levels: T12-L1: Normal L1-2: Normal L2-3: Minimal disc bulge.  No stenosis. L3-4: Mild bulging of the disc. Mild facet and ligamentous hypertrophy. Mild narrowing of the lateral recesses, not likely compressive. L4-5: Mild bulging of the disc. Mild facet and ligamentous hypertrophy. Mild narrowing of the lateral recesses, not likely compressive. Findings could relate to low back pain. L5-S1: As noted above, previous anterior discectomy and fusion procedure. Probable solid union. Chronic fixed anterolisthesis of 5-6 mm. No compressive narrowing of the central canal. Bilateral foraminal narrowing that would have some potential to affect the exiting L5 nerves, similar to the previous studies. 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 IMPRESSION: 1. Previous anterior discectomy and fusion procedure at L5-S1. Probable solid union across the disc space. Chronic fixed anterolisthesis of 5-6 mm. No compressive narrowing of the central canal. Bilateral foraminal narrowing that would have some potential to affect the exiting L5 nerves, similar to the previous studies. 2. L3-4 and L4-5: Mild bulging of the disc. Mild facet and ligamentous hypertrophy. Mild narrowing of the lateral recesses, not likely  compressive. Findings could relate to low back pain. 3. 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 Aortic Atherosclerosis (ICD10-I70.0). Electronically Signed   By: Paulina Fusi M.D.   On: 01/27/2023 16:13   CT Angio Chest PE W and/or Wo Contrast  Result Date: 01/26/2023 CLINICAL DATA:  Intermittent left-sided rib pain for the past few weeks. EXAM: CT ANGIOGRAPHY CHEST CT ABDOMEN AND PELVIS WITH CONTRAST TECHNIQUE: Multidetector CT imaging of the chest was performed using the standard protocol during bolus administration of intravenous contrast. Multiplanar CT image reconstructions and MIPs were obtained to evaluate the  vascular anatomy. Multidetector CT imaging of the abdomen and pelvis was performed using the standard protocol during bolus administration of intravenous contrast. RADIATION DOSE REDUCTION: This exam was performed according to the departmental dose-optimization program which includes automated exposure control, adjustment of the mA and/or kV according to patient size and/or use of iterative reconstruction technique. CONTRAST:  OMNIPAQUE IOHEXOL 350 MG/ML SOLN COMPARISON:  CT abdomen pelvis dated October 02, 2021. FINDINGS: CTA CHEST FINDINGS Cardiovascular: Satisfactory opacification of the pulmonary arteries to the segmental level. No evidence of pulmonary embolism. Mild cardiomegaly status post CABG and left atrial appendage clipping. Trace pericardial effusion. No thoracic aortic aneurysm or dissection. Coronary, aortic arch, and branch vessel atherosclerotic vascular disease. Mediastinum/Nodes: No enlarged mediastinal, hilar, or axillary lymph nodes. Thyroid gland, trachea, and esophagus demonstrate no significant findings. Lungs/Pleura: Ill-defined tiny centrilobular ground-glass densities throughout both lungs. Mild mosaic attenuation. No focal consolidation, pleural effusion, or pneumothorax. Musculoskeletal: No chest wall abnormality. No acute or significant osseous findings. Prior median sternotomy with nonunion. Review of the MIP images confirms the above findings. CT ABDOMEN PELVIS FINDINGS Hepatobiliary: No focal liver abnormality is seen. Status post cholecystectomy. No biliary dilatation. Pancreas: Unremarkable. No pancreatic ductal dilatation or surrounding inflammatory changes. Spleen: Normal in size without focal abnormality. Adrenals/Urinary Tract: Unchanged 1.2 cm right and 1.8 cm left adrenal nodules previously characterized as adenomas. No follow-up imaging is recommended. No renal calculi, mass, or hydronephrosis. The bladder is unremarkable. Stomach/Bowel: Stomach is within normal limits.  Appendix appears normal. No evidence of bowel wall thickening, distention, or inflammatory changes. Mild sigmoid colonic diverticulosis. Vascular/Lymphatic: Aortic atherosclerosis. No enlarged abdominal or pelvic lymph nodes. Reproductive: Uterus and bilateral adnexa are unremarkable. Other: No abdominal wall hernia or abnormality. No abdominopelvic ascites. No pneumoperitoneum. Musculoskeletal: No acute or significant osseous findings. Review of the MIP images confirms the above findings. IMPRESSION: CT chest: 1. No evidence of pulmonary embolism. 2. Ill-defined tiny centrilobular ground-glass densities throughout both lungs with mild mosaic attenuation. Differential considerations include respiratory bronchiolitis and hypersensitivity pneumonitis. 3.  Aortic Atherosclerosis (ICD10-I70.0). CT abdomen pelvis: 1.  No acute intra-abdominal process. Electronically Signed   By: Obie Dredge M.D.   On: 01/26/2023 13:49   CT ABDOMEN PELVIS W CONTRAST  Result Date: 01/26/2023 CLINICAL DATA:  Intermittent left-sided rib pain for the past few weeks. EXAM: CT ANGIOGRAPHY CHEST CT ABDOMEN AND PELVIS WITH CONTRAST TECHNIQUE: Multidetector CT imaging of the chest was performed using the standard protocol during bolus administration of intravenous contrast. Multiplanar CT image reconstructions and MIPs were obtained to evaluate the vascular anatomy. Multidetector CT imaging of the abdomen and pelvis was performed using the standard protocol during bolus administration of intravenous contrast. RADIATION DOSE REDUCTION: This exam was performed according to the departmental dose-optimization program which includes automated exposure control, adjustment of the mA and/or kV according to patient size and/or use of iterative reconstruction technique. CONTRAST:  OMNIPAQUE IOHEXOL 350  MG/ML SOLN COMPARISON:  CT abdomen pelvis dated October 02, 2021. FINDINGS: CTA CHEST FINDINGS Cardiovascular: Satisfactory opacification of the  pulmonary arteries to the segmental level. No evidence of pulmonary embolism. Mild cardiomegaly status post CABG and left atrial appendage clipping. Trace pericardial effusion. No thoracic aortic aneurysm or dissection. Coronary, aortic arch, and branch vessel atherosclerotic vascular disease. Mediastinum/Nodes: No enlarged mediastinal, hilar, or axillary lymph nodes. Thyroid gland, trachea, and esophagus demonstrate no significant findings. Lungs/Pleura: Ill-defined tiny centrilobular ground-glass densities throughout both lungs. Mild mosaic attenuation. No focal consolidation, pleural effusion, or pneumothorax. Musculoskeletal: No chest wall abnormality. No acute or significant osseous findings. Prior median sternotomy with nonunion. Review of the MIP images confirms the above findings. CT ABDOMEN PELVIS FINDINGS Hepatobiliary: No focal liver abnormality is seen. Status post cholecystectomy. No biliary dilatation. Pancreas: Unremarkable. No pancreatic ductal dilatation or surrounding inflammatory changes. Spleen: Normal in size without focal abnormality. Adrenals/Urinary Tract: Unchanged 1.2 cm right and 1.8 cm left adrenal nodules previously characterized as adenomas. No follow-up imaging is recommended. No renal calculi, mass, or hydronephrosis. The bladder is unremarkable. Stomach/Bowel: Stomach is within normal limits. Appendix appears normal. No evidence of bowel wall thickening, distention, or inflammatory changes. Mild sigmoid colonic diverticulosis. Vascular/Lymphatic: Aortic atherosclerosis. No enlarged abdominal or pelvic lymph nodes. Reproductive: Uterus and bilateral adnexa are unremarkable. Other: No abdominal wall hernia or abnormality. No abdominopelvic ascites. No pneumoperitoneum. Musculoskeletal: No acute or significant osseous findings. Review of the MIP images confirms the above findings. IMPRESSION: CT chest: 1. No evidence of pulmonary embolism. 2. Ill-defined tiny centrilobular ground-glass  densities throughout both lungs with mild mosaic attenuation. Differential considerations include respiratory bronchiolitis and hypersensitivity pneumonitis. 3.  Aortic Atherosclerosis (ICD10-I70.0). CT abdomen pelvis: 1.  No acute intra-abdominal process. Electronically Signed   By: Obie Dredge M.D.   On: 01/26/2023 13:49   DG Chest Portable 1 View  Result Date: 01/26/2023 CLINICAL DATA:  Chest pain EXAM: PORTABLE CHEST 1 VIEW COMPARISON:  11/02/2022 FINDINGS: Cardiomegaly status post median sternotomy with left atrial appendage clip. Mild diffuse bilateral interstitial opacity. Osseous structures unremarkable. IMPRESSION: Cardiomegaly with mild diffuse bilateral interstitial opacity, consistent with edema or atypical/viral infection. No focal airspace opacity. Electronically Signed   By: Jearld Lesch M.D.   On: 01/26/2023 12:42    Microbiology: Results for orders placed or performed during the hospital encounter of 01/26/23  Culture, blood (Routine X 2) w Reflex to ID Panel     Status: None (Preliminary result)   Collection Time: 01/27/23 12:49 AM   Specimen: BLOOD  Result Value Ref Range Status   Specimen Description   Final    BLOOD BLOOD RIGHT HAND Performed at [facility], [address], [place], [place] [address]    Special Requests   Final    BOTTLES DRAWN AEROBIC AND ANAEROBIC Blood Culture results may not be optimal due to an inadequate volume of blood received in culture bottles Performed at [facility], [address], [place], [place] [address]    Culture   Final    NO GROWTH 1 DAY Performed at [facility], [address] [place], [place] [address]    Report Status PENDING  Incomplete  Culture, blood (Routine X 2) w Reflex to ID Panel     Status: None (Preliminary result)   Collection Time: 01/27/23 12:51 AM   Specimen: BLOOD LEFT ARM  Result Value Ref Range Status   Specimen Description   Final    BLOOD LEFT  ARM Performed at [facility], [address] [place], [place] [address]    Special Requests   Final    BOTTLES DRAWN AEROBIC AND ANAEROBIC Blood Culture adequate volume Performed at [facility], [address] [place], [place] [address]    Culture   Final    NO GROWTH 1 DAY Performed at [facility], [address] [place], [place] [address]    Report Status PENDING  Incomplete    Labs: CBC: Recent Labs  Lab 01/26/23 1106 01/26/23 2108 01/27/23 0051  WBC 11.3* 10.2 9.5  HGB 13.5 12.6 11.9*  HCT 41.2 39.3 37.8  MCV 86.7 88.7 91.7  PLT 368 315 291   Basic Metabolic Panel: Recent Labs  Lab 01/26/23 1106 01/26/23 2108 01/27/23 0051  NA 138  --  134*  K 4.2  --  4.1  CL 102  --  100  CO2 28  --  26  GLUCOSE 102*  --  103*  BUN 8  --  5*  CREATININE 0.63 0.64 0.65  CALCIUM 9.1  --  8.6*   Liver Function Tests: Recent Labs  Lab 01/26/23 1124 01/27/23 0051  AST 13* 18  ALT 11 15  ALKPHOS 129* 134*  BILITOT 0.3 0.5  PROT 7.3 6.2*  ALBUMIN 4.2 3.3*   CBG: Recent Labs  Lab 01/26/23 1102  GLUCAP 101*    Discharge time spent: greater than 30 minutes.  Signed: Meredeth Ide, MD [facility] 01/28/2023

## 2023-01-28 NOTE — Anesthesia Postprocedure Evaluation (Signed)
Anesthesia Post Note  Patient: [name]  Procedure(s) Performed: ESOPHAGOGASTRODUODENOSCOPY (EGD) BIOPSY     Patient location during evaluation: PACU Anesthesia Type: MAC Level of consciousness: awake and alert Pain management: pain level controlled Vital Signs Assessment: post-procedure vital signs reviewed and stable Respiratory status: spontaneous breathing, nonlabored ventilation, respiratory function stable and patient connected to nasal cannula oxygen Cardiovascular status: stable and blood pressure returned to baseline Postop Assessment: no apparent nausea or vomiting Anesthetic complications: no   No notable events documented.  Last Vitals:  Vitals:   01/28/23 1340 01/28/23 1415  BP: 135/77 132/86  Pulse: 89 85  Resp: 14 14  Temp:  36.7 C  SpO2: 96% 97%    Last Pain:  Vitals:   01/28/23 1415  TempSrc: Oral  PainSc:                  Nelle Don [name]

## 2023-01-28 NOTE — Progress Notes (Signed)
Discharge instructions discussed with patient and family, verbalized agreement and understanding 

## 2023-01-29 ENCOUNTER — Encounter (HOSPITAL_COMMUNITY): Payer: Self-pay | Admitting: Internal Medicine

## 2023-01-29 LAB — LEGIONELLA PNEUMOPHILA SEROGP 1 UR AG: L. pneumophila Serogp 1 Ur Ag: NEGATIVE

## 2023-01-29 LAB — SURGICAL PATHOLOGY

## 2023-01-29 LAB — CULTURE, BLOOD (ROUTINE X 2): Culture: NO GROWTH

## 2023-01-30 LAB — CULTURE, BLOOD (ROUTINE X 2): Culture: NO GROWTH

## 2023-02-01 LAB — CULTURE, BLOOD (ROUTINE X 2): Special Requests: ADEQUATE

## 2023-02-04 ENCOUNTER — Ambulatory Visit: Payer: BC Managed Care – PPO | Admitting: Physician Assistant

## 2023-02-23 ENCOUNTER — Other Ambulatory Visit (HOSPITAL_COMMUNITY): Payer: Self-pay

## 2023-02-25 ENCOUNTER — Encounter: Payer: Self-pay | Admitting: Cardiovascular Disease

## 2023-02-25 ENCOUNTER — Ambulatory Visit: Payer: BC Managed Care – PPO | Attending: Cardiovascular Disease | Admitting: Cardiovascular Disease

## 2023-02-25 VITALS — BP 138/86 | HR 108 | Ht 62.5 in | Wt 179.0 lb

## 2023-02-25 DIAGNOSIS — I255 Ischemic cardiomyopathy: Secondary | ICD-10-CM | POA: Diagnosis not present

## 2023-02-25 DIAGNOSIS — E7800 Pure hypercholesterolemia, unspecified: Secondary | ICD-10-CM

## 2023-02-25 DIAGNOSIS — I2510 Atherosclerotic heart disease of native coronary artery without angina pectoris: Secondary | ICD-10-CM | POA: Diagnosis not present

## 2023-02-25 DIAGNOSIS — I493 Ventricular premature depolarization: Secondary | ICD-10-CM | POA: Diagnosis not present

## 2023-02-25 MED ORDER — METOPROLOL SUCCINATE ER 50 MG PO TB24: 50.0000 mg | ORAL_TABLET | Freq: Two times a day (BID) | ORAL | 3 refills | Status: DC

## 2023-02-25 NOTE — Progress Notes (Signed)
Chief Complaint  Patient presents with   Follow-up    CAD    History of Present Illness: 52 yo female with history of CAD s/p 3V CABG, atrial fibrillation, ischemic cardiomyopathy, anxiety, fibromyalgia, GERD, arthritis, HLD, rheumatoid arthritis, tobacco abuse here today for cardiac follow up. She was seen as a new patient for the evaluation of her cardiac risk factors in March 2021. Normal exercise stress test March 2021. Echo March 2021 with LVEF=50-55%. No valve disease. Cardiac monitor April 2021 with sinus rhythm, 2 short runs of VT (longest 9 beats), 3 short runs of SVt (longest 12 beats).  She was told that she had atrial fibrillation back in 2023. 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 Echo January 2024 with LVEF=35-40%. Mild to moderate mitral valve regurgitation. Coronary CTA January 2024 with moderate 3 vessel CAD. Cardiac cath 09/07/22 with severe LAD and Diagonal disease. She underwent 4V CABG on 10/08/22 with overall uneventful postoperative course. Also with pulmonary vein isolation ablation and ligation of the left atrial appendage at the time of her surgery. Echo June 2024 with normal LV function and no significant valve disease.   She is here today for follow up. The patient denies any chest pain, dyspnea, palpitations, lower extremity edema, orthopnea, PND, dizziness, near syncope or syncope. Her BP has been over 140 consistently at home, HR in the low 100s.   Primary Care Physician: Noberto Retort, MD  Past Medical History:  Diagnosis Date   A-fib [facility])    Anxiety    Arthralgia    Asthma    Attention-deficit hyperactivity disorder, predominantly inattentive type    Back pain    Dysrhythmia    Afib   Fear of flying    Fibromyalgia    Gastroesophageal reflux disease    HPV in female    Joint pain    Localized edema    Neck pain    OA (osteoarthritis)    Papillomavirus as the cause of diseases classified elsewhere    Primary insomnia    Pure hypercholesterolemia     Rheumatoid arthritis, seronegative, hand, unspecified laterality (HCC)    patient feels it is back related, not a true arthritis   Spondylolisthesis of lumbosacral region    Tobacco dependence    Unspecified inflammatory spondylopathy, cervical region (HCC)    Vitamin B12 deficiency anemia    Voice hoarseness     Past Surgical History:  Procedure Laterality Date   ABDOMINAL EXPOSURE N/A 06/28/2020   Procedure: ABDOMINAL EXPOSURE;  Surgeon: Cephus Shelling, MD;  Location: [facility] OR;  Service: Vascular;  Laterality: N/A;  anterior   ANTERIOR LUMBAR FUSION N/A 06/28/2020   Procedure: ANTERIOR LUMBAR INTERBODY FUSION LUMBAR FIVE-SACRAL ONE.;  Surgeon: Tia Alert, MD;  Location: [facility] OR;  Service: Neurosurgery;  Laterality: N/A;  anterior approach   BIOPSY  01/28/2023   Procedure: BIOPSY;  Surgeon: Lynann Bologna, DO;  Location: [facility];  Service: Gastroenterology;;   BREAST ENHANCEMENT SURGERY     CARPAL TUNNEL RELEASE Left    CARPAL TUNNEL RELEASE Right 06/28/2020   Procedure: CARPAL TUNNEL RELEASE;  Surgeon: Tia Alert, MD;  Location: [facility] OR;  Service: Neurosurgery;  Laterality: Right;   CERVICAL SPINE SURGERY     CHOLECYSTECTOMY     CLIPPING OF ATRIAL APPENDAGE N/A 10/08/2022   Procedure: CLIPPING OF ATRIAL APPENDAGE USING ATRICURE ATRICLIP PRO2 SIZE 35;  Surgeon: Alleen Borne, MD;  Location: [facility] OR;  Service: Open Heart Surgery;  Laterality: N/A;  CORONARY ARTERY BYPASS GRAFT N/A 10/08/2022   Procedure: CORONARY ARTERY BYPASS GRAFTING (CABG) X3 USING LEFT INTERNAL MAMMARY ARTERY AND RIGHT GREATER SAPHENOUS VEIN HARVESTED ENDOSCOPICALLY;  Surgeon: Alleen Borne, MD;  Location: [facility] OR;  Service: Open Heart Surgery;  Laterality: N/A;   CORONARY PRESSURE/FFR STUDY N/A 09/07/2022   Procedure: INTRAVASCULAR PRESSURE WIRE/FFR STUDY;  Surgeon: Yvonne Kendall, MD;  Location: [facility]VAS[facility] CV LAB;  Service: Cardiovascular;  Laterality: N/A;   ESOPHAGOGASTRODUODENOSCOPY N/A  01/28/2023   Procedure: ESOPHAGOGASTRODUODENOSCOPY (EGD);  Surgeon: Lynann Bologna, DO;  Location: [facility];  Service: Gastroenterology;  Laterality: N/A;   FOOT SURGERY     MANDIBLE SURGERY     MAZE N/A 10/08/2022   Procedure: PULMONARY VEIN ABLATION USING ATRICURE ISOLATOR SYNERGY ENCOMPASS OLH;  Surgeon: Alleen Borne, MD;  Location: [facility] OR;  Service: Open Heart Surgery;  Laterality: N/A;   RIGHT/LEFT HEART CATH AND CORONARY ANGIOGRAPHY N/A 09/07/2022   Procedure: RIGHT/LEFT HEART CATH AND CORONARY ANGIOGRAPHY;  Surgeon: Yvonne Kendall, MD;  Location: [facility]VAS[facility] CV LAB;  Service: Cardiovascular;  Laterality: N/A;   TEE WITHOUT CARDIOVERSION N/A 10/08/2022   Procedure: TRANSESOPHAGEAL ECHOCARDIOGRAM;  Surgeon: Alleen Borne, MD;  Location: [facility] OR;  Service: Open Heart Surgery;  Laterality: N/A;   TONSILLECTOMY     TUBAL LIGATION     with ablation   TYMPANOSTOMY TUBE PLACEMENT      Current Outpatient Medications  Medication Sig Dispense Refill   albuterol (VENTOLIN HFA) 108 (90 Base) MCG/ACT inhaler Inhale 2 puffs into the lungs every 4 (four) hours as needed for wheezing or shortness of breath. 6.7 g 1   amphetamine-dextroamphetamine (ADDERALL XR) 30 MG 24 hr capsule Take 60 mg by mouth daily.     atorvastatin (LIPITOR) 80 MG tablet Take 1 tablet (80 mg total) by mouth daily. 90 tablet 3   cyanocobalamin (VITAMIN B12) 1000 MCG tablet Take 1,000 mcg by mouth daily.     escitalopram (LEXAPRO) 10 MG tablet Take 10 mg by mouth daily as needed (anxiety).     ferrous sulfate 325 (65 FE) MG EC tablet Take 325 mg by mouth daily.     furosemide (LASIX) 20 MG tablet Take 1 tablet (20 mg total) by mouth daily as needed (for lower extremity edema). 45 tablet 3   gabapentin (NEURONTIN) 100 MG capsule Take 100-200 mg by mouth 4 (four) times daily as needed (pain).     gabapentin (NEURONTIN) 300 MG capsule Take 600 mg by mouth daily as needed (pain).     metoprolol succinate (TOPROL-XL) 50 MG  24 hr tablet Take 1 tablet (50 mg total) by mouth 2 (two) times daily. Take with or immediately following a meal. 180 tablet 3   nicotine (NICODERM CQ - DOSED IN MG/24 HOURS) 21 mg/24hr patch Place 1 patch (21 mg total) onto the skin daily. 28 patch 0   oxyCODONE (ROXICODONE) 5 MG immediate release tablet Take 1 tablet (5 mg total) by mouth every 6 (six) hours as needed. 15 tablet 0   pantoprazole (PROTONIX) 40 MG tablet Take 1 tablet (40 mg total) by mouth daily. 30 tablet 1   VITAMIN D, CHOLECALCIFEROL, PO Take 1 tablet by mouth daily.     zolpidem (AMBIEN) 10 MG tablet Take 10 mg by mouth at bedtime.     nitroGLYCERIN (NITROSTAT) 0.4 MG SL tablet Place 1 tablet (0.4 mg total) under the tongue every 5 (five) minutes as needed for chest pain. 25 tablet 3   sucralfate (CARAFATE)  1 GM/10ML suspension Take 10 mLs (1 g total) by mouth 4 (four) times daily -  with meals and at bedtime for 14 days. 560 mL 0   Current Facility-Administered Medications  Medication Dose Route Frequency Provider Last Rate Last Admin   lactulose (CHRONULAC) 10 GM/15ML solution 20 g  20 g Oral Daily PRN Alleen Borne, MD        Allergies  Allergen Reactions   Zoloft [Sertraline] Other (See Comments)    Altered mental state    Social History   Socioeconomic History   Marital status: Married    Spouse name: Not on file   Number of children: 2   Years of education: Not on file   Highest education level: Not on file  Occupational History   Occupation: Works at home  Tobacco Use   Smoking status: Every Day    Current packs/day: 1.00    Types: Cigarettes   Smokeless tobacco: Never  Vaping Use   Vaping status: Never Used  Substance and Sexual Activity   Alcohol use: Yes    Comment: occasional   Drug use: Never   Sexual activity: Not on file  Other Topics Concern   Not on file  Social History Narrative   Not on file   Social Determinants of Health   Financial Resource Strain: Not on file  Food  Insecurity: No Food Insecurity (10/08/2022)   Hunger Vital Sign    Worried About Running Out of Food in the Last Year: Never true    Ran Out of Food in the Last Year: Never true  Transportation Needs: No Transportation Needs (10/08/2022)   PRAPARE - Administrator, Civil Service (Medical): No    Lack of Transportation (Non-Medical): No  Physical Activity: Not on file  Stress: Not on file  Social Connections: Unknown (09/10/2022)   [facility]   Social Network    Social Network: Not on file  Intimate Partner Violence: Not At Risk (10/08/2022)   Humiliation, Afraid, Rape, and Kick questionnaire    Fear of Current or Ex-Partner: No    Emotionally Abused: No    Physically Abused: No    Sexually Abused: No    Family History  Problem Relation Age of Onset   Hypertension Father    Heart disease Father     Review of Systems:  As stated in the HPI and otherwise negative.   BP 138/86   Pulse (!) 108   Ht 5' 2.5" (1.588 m)   Wt 81.2 kg   SpO2 99%   BMI 32.22 kg/m   Physical Examination: General: Well developed, well nourished, NAD  HEENT: OP clear, mucus membranes moist  SKIN: warm, dry. No rashes. Neuro: No focal deficits  Musculoskeletal: Muscle strength 5/5 all ext  Psychiatric: Mood and affect normal  Neck: No JVD, no carotid bruits, no thyromegaly, no lymphadenopathy.  Lungs:Clear bilaterally, no wheezes, rhonci, crackles Cardiovascular: Regular rate and rhythm. No murmurs, gallops or rubs. Abdomen:Soft. Bowel sounds present. Non-tender.  Extremities: No lower extremity edema. Pulses are 2 + in the bilateral DP/PT.  EKG:  EKG is  not ordered today. The ekg ordered today demonstrates   Echo June 2024: 1. Left ventricular ejection fraction, by estimation, is 55 to 60%. Left  ventricular ejection fraction by PLAX is 59 %. The left ventricle has  normal function. The left ventricle demonstrates regional wall motion  abnormalities (see scoring   diagram/findings for description). There is mild left ventricular  hypertrophy. Left ventricular diastolic parameters are consistent with  Grade I diastolic dysfunction (impaired relaxation). There is moderate  hypokinesis of the left ventricular, basal-mid   septal wall.   2. Right ventricular systolic function is moderately reduced. The right  ventricular size is normal. Tricuspid regurgitation signal is inadequate  for assessing PA pressure.   3. The mitral valve is abnormal. Trivial mitral valve regurgitation.   4. The aortic valve is tricuspid. Aortic valve regurgitation is not  visualized. Aortic valve sclerosis is present, with no evidence of aortic  valve stenosis.   5. Aortic dilatation noted. There is borderline dilatation of the  ascending aorta, measuring 37 mm.   6. The inferior vena cava is normal in size with greater than 50%  respiratory variability, suggesting right atrial pressure of 3 mmHg.   7. Cannot exclude a small PFO.   Recent Labs: 06/18/2022: TSH 0.652 10/09/2022: Magnesium 2.2 01/27/2023: ALT 15; B Natriuretic Peptide 53.5; BUN 5; Creatinine, Ser 0.65; Hemoglobin 11.9; Platelets 291; Potassium 4.1; Sodium 134   Lipid Panel    Component Value Date/Time   CHOL 146 11/16/2022 1555   TRIG 247 (H) 11/16/2022 1555   HDL 41 11/16/2022 1555   CHOLHDL 3.6 11/16/2022 1555   LDLCALC 65 11/16/2022 1555     Wt Readings from Last 3 Encounters:  02/25/23 81.2 kg  01/28/23 81.6 kg  01/04/23 80.3 kg    Assessment and Plan:   1. CAD s/p CABG without angina: She is doing well. No chest pain suggestive of angina. Continue ASA, statin and Toprol. Will increase Toprol to 50 mg po BID. She wishes to start cardiac rehab.   2. Hyperlipidemia: LDL 65 in May 2024. Continue statin.    3. Palpitations/SVT/PVCs/Possible atrial fibrillation: Cardiac monitor February 2024 with no evidence of atrial fib. No palpitations. Will not start Eliquis at this time unless she has  recurrence of atrial fib.   4. Ischemic cardiomyopathy: LVEF=35-40% prior to CABG but now back to normal by echo in June 2024 post CABG.    Labs/ tests ordered today include:   Orders Placed This Encounter  Procedures   AMB referral to cardiac rehabilitation   Disposition:   F/U with me in 12 months.    Signed, Verne Carrow, MD 02/25/2023 3:34 PM    [facility] [address] [place], [place], [place]  [address] Phone: [phone]; Fax: [phone]

## 2023-02-25 NOTE — Patient Instructions (Signed)
Medication Instructions:  Your physician has recommended you make the following change in your medication:  1.) increase Toprol XL to 50 mg - one tablet twice daily  *If you need a refill on your cardiac medications before your next appointment, please call your pharmacy*   Lab Work: none If you have labs (blood work) drawn today and your tests are completely normal, you will receive your results only by: MyChart Message (if you have MyChart) OR A paper copy in the mail If you have any lab test that is abnormal or we need to change your treatment, we will call you to review the results.   Follow-Up: At [facility], you and your health needs are our priority.  As part of our continuing mission to provide you with exceptional heart care, we have created designated Provider Care Teams.  These Care Teams include your primary Cardiologist (physician) and Advanced Practice Providers (APPs -  Physician Assistants and Nurse Practitioners) who all work together to provide you with the care you need, when you need it.   Your next appointment:   6 month(s)  Provider:   Dr. Clifton James or Advanced Practice Provider (NP or PA-C)  You have been referred to cardiac rehabilitation

## 2023-03-02 ENCOUNTER — Telehealth (HOSPITAL_COMMUNITY): Payer: Self-pay

## 2023-03-02 NOTE — Telephone Encounter (Signed)
Called pt to schedule cardiac rehab orientation. LM on VM

## 2023-03-12 ENCOUNTER — Telehealth (HOSPITAL_COMMUNITY): Payer: Self-pay | Admitting: *Deleted

## 2023-03-12 NOTE — Telephone Encounter (Signed)
Left message to call cardiac rehab regarding orientation.Thayer Headings RN BSN

## 2023-03-16 ENCOUNTER — Encounter (HOSPITAL_COMMUNITY): Payer: Self-pay

## 2023-03-16 ENCOUNTER — Encounter (HOSPITAL_COMMUNITY)
Admission: RE | Admit: 2023-03-16 | Discharge: 2023-03-16 | Disposition: A | Discharge status: Home / Self Care | Payer: BC Managed Care – PPO | Source: Ambulatory Visit | Attending: Cardiovascular Disease | Admitting: Cardiovascular Disease

## 2023-03-16 VITALS — BP 108/64 | HR 99 | Ht 62.75 in | Wt 176.4 lb

## 2023-03-16 DIAGNOSIS — Z951 Presence of aortocoronary bypass graft: Secondary | ICD-10-CM | POA: Diagnosis present

## 2023-03-16 DIAGNOSIS — Z48812 Encounter for surgical aftercare following surgery on the circulatory system: Secondary | ICD-10-CM | POA: Insufficient documentation

## 2023-03-16 HISTORY — DX: Atherosclerotic heart disease of native coronary artery without angina pectoris: I25.10

## 2023-03-16 NOTE — Progress Notes (Signed)
Cardiac Individual Treatment Plan  Patient Details  Name: [name] MRN: [id number] Date of Birth: [birth year] Referring Provider:   Flowsheet Row INTENSIVE CARDIAC REHAB ORIENT from 03/16/2023 in [facility]  Referring Provider Kathleene Hazel, MD.       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  Patient's Home Medications on Admission:  Current Outpatient Medications:    albuterol (VENTOLIN HFA) 108 (90 Base) MCG/ACT inhaler, Inhale 2 puffs into the lungs every 4 (four) hours as needed for wheezing or shortness of breath., Disp: 6.7 g, Rfl: 1   amphetamine-dextroamphetamine (ADDERALL XR) 30 MG 24 hr capsule, Take 60 mg by mouth daily., Disp: , Rfl:    atorvastatin (LIPITOR) 80 MG tablet, Take 1 tablet (80 mg total) by mouth daily., Disp: 90 tablet, Rfl: 3   cyanocobalamin (VITAMIN B12) 1000 MCG tablet, Take 1,000 mcg by mouth daily., Disp: , Rfl:    escitalopram (LEXAPRO) 10 MG tablet, Take 10 mg by mouth daily as needed (anxiety)., Disp: , Rfl:    ferrous sulfate 325 (65 FE) MG EC tablet, Take 325 mg by mouth daily. Takes as needed, Disp: , Rfl:    furosemide (LASIX) 20 MG tablet, Take 1 tablet (20 mg total) by mouth daily as needed (for lower extremity edema)., Disp: 45 tablet, Rfl: 3   gabapentin (NEURONTIN) 100 MG capsule, Take 100-200 mg by mouth 4 (four) times daily as needed (pain)., Disp: , Rfl:    gabapentin (NEURONTIN) 300 MG capsule, Take 600 mg by mouth daily as needed (pain)., Disp: , Rfl:    metoprolol succinate (TOPROL-XL) 50 MG 24 hr tablet, Take 1 tablet (50 mg total) by mouth 2 (two) times daily. Take with or immediately following a meal., Disp: 180 tablet, Rfl: 3   nicotine (NICODERM CQ - DOSED IN MG/24 HOURS) 21 mg/24hr  patch, Place 1 patch (21 mg total) onto the skin daily., Disp: 28 patch, Rfl: 0   nitroGLYCERIN (NITROSTAT) 0.4 MG SL tablet, Place 1 tablet (0.4 mg total) under the tongue every 5 (five) minutes as needed for chest pain., Disp: 25 tablet, Rfl: 3   pantoprazole (PROTONIX) 40 MG tablet, Take 1 tablet (40 mg total) by mouth daily., Disp: 30 tablet, Rfl: 1   VITAMIN D, CHOLECALCIFEROL, PO, Take 1 tablet by mouth daily., Disp: , Rfl:    zolpidem (AMBIEN) 10 MG tablet, Take 10 mg by mouth at bedtime., Disp: , Rfl:    sucralfate (CARAFATE) 1 GM/10ML suspension, Take 10 mLs (1 g total) by mouth 4 (four) times daily -  with meals and at bedtime for 14 days., Disp: 560 mL, Rfl: 0  Current Facility-Administered Medications:    lactulose (CHRONULAC) 10 GM/15ML solution 20 g, 20 g, Oral, Daily PRN, Alleen Borne, MD  Past Medical History: Past Medical History:  Diagnosis Date   A-fib (HCC)    Anxiety    Arthralgia    Asthma    Attention-deficit hyperactivity disorder, predominantly inattentive type    Back pain    Coronary artery disease    Dysrhythmia    Afib   Fear of flying    Fibromyalgia    denies having Fibromyalgia   Gastroesophageal reflux disease    HPV in female    Joint  pain    Localized edema    Neck pain    OA (osteoarthritis)    Papillomavirus as the cause of diseases classified elsewhere    Primary insomnia    Pure hypercholesterolemia    Rheumatoid arthritis, seronegative, hand, unspecified laterality (HCC)    patient feels it is back related, not a true arthritis   Spondylolisthesis of lumbosacral region    Tobacco dependence    Unspecified inflammatory spondylopathy, cervical region (HCC)    Vitamin B12 deficiency anemia    Voice hoarseness     Tobacco Use: Social History   Tobacco Use  Smoking Status Every Day   Current packs/day: 1.00   Average packs/day: 1 pack/day for 30.7 years (30.7 ttl pk-yrs)   Types: Cigarettes   Start date: 1994  Smokeless Tobacco  Never    Labs: Review Flowsheet  More data may exist      Latest Ref Rng & Units 09/01/2022 09/07/2022 10/06/2022 10/08/2022 11/16/2022  Labs for ITP Cardiac and Pulmonary Rehab  Cholestrol 100 - 199 mg/dL 981  - - - 191   LDL (calc) 0 - 99 mg/dL 478  - - - 65   HDL-C >29 mg/dL 60  - - - 41   Trlycerides 0 - 149 mg/dL 562  - - - 130   Hemoglobin A1c 4.8 - 5.6 % - - 5.8  - -  PH, Arterial 7.35 - 7.45 - 7.390  7.55  7.347  7.293  7.308  7.196  7.307  7.319  7.377  7.395  7.443  7.427  7.381  -  PCO2 arterial 32 - 48 mmHg - 39.9  24  45.9  52.8  48.6  62.5  46.6  46.1  39.3  39.3  33.1  36.0  38.8  -  Bicarbonate 20.0 - 28.0 mmol/L - 24.2  24.8  21.0  25.0  25.5  24.4  24.3  23.3  23.7  23.1  24.1  22.6  23.7  25.4  23.0  -  TCO2 22 - 32 mmol/L - 25  26  - 26  27  26  26  25  23  25  26  24  25  26  24  25  25  27  24  26  29   -  Acid-base deficit 0.0 - 2.0 mmol/L - 1.0  1.0  - 1.0  1.0  2.0  4.0  3.0  2.0  2.0  1.0  1.0  1.0  2.0  -  O2 Saturation % - 93  61  100  96  92  98  93  100  92  100  100  100  100  68  100  -    Details       Multiple values from one day are sorted in reverse-chronological order         Capillary Blood Glucose: Lab Results  Component Value Date   GLUCAP 101 (H) 01/26/2023   GLUCAP 109 (H) 10/12/2022   GLUCAP 118 (H) 10/12/2022   GLUCAP 111 (H) 10/12/2022   GLUCAP 111 (H) 10/11/2022     Exercise Target Goals: Exercise Program Goal: Individual exercise prescription set using results from initial 6 min walk test and THRR while considering  patient's activity barriers and safety.   Exercise Prescription Goal: Initial exercise prescription builds to 30-45 minutes a day of aerobic activity, 2-3 days per week.  Home exercise guidelines will be given to patient during program as part of  exercise prescription that the participant will acknowledge.  Activity Barriers & Risk Stratification:  Activity Barriers & Cardiac Risk Stratification - 03/16/23 1345        Activity Barriers & Cardiac Risk Stratification   Activity Barriers Back Problems;Other (comment);Neck/Spine Problems    Comments Chronic back pain, neck pain, lumbar fusion    Cardiac Risk Stratification High             6 Minute Walk:  6 Minute Walk     Row Name 03/16/23 1436         6 Minute Walk   Phase Initial     Distance 2033 feet     Walk Time 6 minutes     # of Rest Breaks 0     MPH 3.85     METS 5.19     RPE 12     Perceived Dyspnea  1     VO2 Peak 18.17     Symptoms Yes (comment)     Comments Mild shortness of breath. Mild headache, which she rated 2/10 on the pain scale after the walk test.     Resting HR 99 bpm     Resting BP 108/64     Resting Oxygen Saturation  98 %     Exercise Oxygen Saturation  during 6 min walk 100 %     Max Ex. HR 119 bpm     Max Ex. BP 146/80     2 Minute Post BP 128/82              Oxygen Initial Assessment:   Oxygen Re-Evaluation:   Oxygen Discharge (Final Oxygen Re-Evaluation):   Initial Exercise Prescription:  Initial Exercise Prescription - 03/16/23 1500       Date of Initial Exercise RX and Referring Provider   Date 03/16/23    Referring Provider Kathleene Hazel, MD.    Expected Discharge Date 06/09/23      Recumbant Bike   Level 1    Watts 50    Minutes 15    METs 2.9      NuStep   Level 2    SPM 85    Minutes 15    METs 2.5      Prescription Details   Frequency (times per week) 3    Duration Progress to 30 minutes of continuous aerobic without signs/symptoms of physical distress      Intensity   THRR 40-80% of Max Heartrate 67-134    Ratings of Perceived Exertion 11-13    Perceived Dyspnea 0-4      Progression   Progression Continue to progress workloads to maintain intensity without signs/symptoms of physical distress.      Resistance Training   Training Prescription Yes    Weight 3 lbs    Reps 10-15             Perform Capillary Blood Glucose checks as  needed.  Exercise Prescription Changes:   Exercise Comments:   Exercise Goals and Review:   Exercise Goals     Row Name 03/16/23 1312             Exercise Goals   Increase Physical Activity Yes       Intervention Provide advice, education, support and counseling about physical activity/exercise needs.;Develop an individualized exercise prescription for aerobic and resistive training based on initial evaluation findings, risk stratification, comorbidities and participant's personal goals.       Expected Outcomes Short Term: Attend rehab on a  regular basis to increase amount of physical activity.;Long Term: Add in home exercise to make exercise part of routine and to increase amount of physical activity.;Long Term: Exercising regularly at least 3-5 days a week.       Increase Strength and Stamina Yes       Intervention Provide advice, education, support and counseling about physical activity/exercise needs.;Develop an individualized exercise prescription for aerobic and resistive training based on initial evaluation findings, risk stratification, comorbidities and participant's personal goals.       Expected Outcomes Short Term: Increase workloads from initial exercise prescription for resistance, speed, and METs.;Short Term: Perform resistance training exercises routinely during rehab and add in resistance training at home;Long Term: Improve cardiorespiratory fitness, muscular endurance and strength as measured by increased METs and functional capacity ( )       Able to understand and use rate of perceived exertion (RPE) scale Yes       Intervention Provide education and explanation on how to use RPE scale       Expected Outcomes Short Term: Able to use RPE daily in rehab to express subjective intensity level;Long Term:  Able to use RPE to guide intensity level when exercising independently       Knowledge and understanding of Target Heart Rate Range (THRR) Yes       Intervention  Provide education and explanation of THRR including how the numbers were predicted and where they are located for reference       Expected Outcomes Short Term: Able to state/look up THRR;Long Term: Able to use THRR to govern intensity when exercising independently;Short Term: Able to use daily as guideline for intensity in rehab       Able to check pulse independently Yes       Intervention Provide education and demonstration on how to check pulse in carotid and radial arteries.;Review the importance of being able to check your own pulse for safety during independent exercise       Expected Outcomes Short Term: Able to explain why pulse checking is important during independent exercise;Long Term: Able to check pulse independently and accurately       Understanding of Exercise Prescription Yes       Intervention Provide education, explanation, and written materials on patient's individual exercise prescription       Expected Outcomes Short Term: Able to explain program exercise prescription;Long Term: Able to explain home exercise prescription to exercise independently                Exercise Goals Re-Evaluation :   Discharge Exercise Prescription (Final Exercise Prescription Changes):   Nutrition:  Target Goals: Understanding of nutrition guidelines, daily intake of sodium 1500mg , cholesterol 200mg , calories 30% from fat and 7% or less from saturated fats, daily to have 5 or more servings of fruits and vegetables.  Biometrics:  Pre Biometrics - 03/16/23 1309       Pre Biometrics   Waist Circumference 41.25 inches    Hip Circumference 43.25 inches    Waist to Hip Ratio 0.95 %    Triceps Skinfold 32 mm    % Body Fat 43 %    Grip Strength 22 kg    Flexibility 14.63 in    Single Leg Stand 30 seconds              Nutrition Therapy Plan and Nutrition Goals:   Nutrition Assessments:  MEDIFICTS Score Key: ?70 Need to make dietary changes  40-70 Heart Healthy Diet ? 40  Therapeutic Level Cholesterol Diet    Picture Your Plate Scores: <09 Unhealthy dietary pattern with much room for improvement. 41-50 Dietary pattern unlikely to meet recommendations for good health and room for improvement. 51-60 More healthful dietary pattern, with some room for improvement.  >60 Healthy dietary pattern, although there may be some specific behaviors that could be improved.    Nutrition Goals Re-Evaluation:   Nutrition Goals Re-Evaluation:   Nutrition Goals Discharge (Final Nutrition Goals Re-Evaluation):   Psychosocial: Target Goals: Acknowledge presence or absence of significant depression and/or stress, maximize coping skills, provide positive support system. Participant is able to verbalize types and ability to use techniques and skills needed for reducing stress and depression.  Initial Review & Psychosocial Screening:  Initial Psych Review & Screening - 03/16/23 1341       Initial Review   Current issues with History of Depression;Current Stress Concerns    Source of Stress Concerns Chronic Illness;Financial;Family    Comments Saunders Glance denies being depressed currently. Anissa is currently not working as she lost her job after having open heart surgery      Family Dynamics   Good Support System? Yes   Anissa has her Mom and daughters for support     Barriers   Psychosocial barriers to participate in program The patient should benefit from training in stress management and relaxation.      Screening Interventions   Interventions Encouraged to exercise;Provide feedback about the scores to participant;To provide support and resources with identified psychosocial needs    Expected Outcomes Short Term goal: Utilizing psychosocial counselor, staff and physician to assist with identification of specific Stressors or current issues interfering with healing process. Setting desired goal for each stressor or current issue identified.;Long Term Goal: Stressors or  current issues are controlled or eliminated.;Short Term goal: Identification and review with participant of any Quality of Life or Depression concerns found by scoring the questionnaire.;Long Term goal: The participant improves quality of Life and PHQ9 Scores as seen by post scores and/or verbalization of changes             Quality of Life Scores:  Quality of Life - 03/16/23 1347       Quality of Life   Select Quality of Life      Quality of Life Scores   Health/Function Pre 19.17 %    Socioeconomic Pre 19.38 %    Psych/Spiritual Pre 20.57 %    Family Pre 24.1 %    GLOBAL Pre 20.2 %            Scores of 19 and below usually indicate a poorer quality of life in these areas.  A difference of  2-3 points is a clinically meaningful difference.  A difference of 2-3 points in the total score of the Quality of Life Index has been associated with significant improvement in overall quality of life, self-image, physical symptoms, and general health in studies assessing change in quality of life.  PHQ-9: Review Flowsheet       03/16/2023  Depression screen PHQ 2/9  Decreased Interest 0  Down, Depressed, Hopeless 0  PHQ - 2 Score 0  Altered sleeping 3  Tired, decreased energy 3  Change in appetite 3  Feeling bad or failure about yourself  0  Trouble concentrating 0  Moving slowly or fidgety/restless 0  Suicidal thoughts 0  PHQ-9 Score 9  Difficult doing work/chores Somewhat difficult    Details  Interpretation of Total Score  Total Score Depression Severity:  1-4 = Minimal depression, 5-9 = Mild depression, 10-14 = Moderate depression, 15-19 = Moderately severe depression, 20-27 = Severe depression   Psychosocial Evaluation and Intervention:   Psychosocial Re-Evaluation:   Psychosocial Discharge (Final Psychosocial Re-Evaluation):   Vocational Rehabilitation: Provide vocational rehab assistance to qualifying candidates.   Vocational Rehab Evaluation &  Intervention:  Vocational Rehab - 03/16/23 1347       Initial Vocational Rehab Evaluation & Intervention   Assessment shows need for Vocational Rehabilitation No   Anissa is not working currently denies the need for vocational rehab at this time            Education: Education Goals: Education classes will be provided on a weekly basis, covering required topics. Participant will state understanding/return demonstration of topics presented.     Core Videos: Exercise    Move It!  Clinical staff conducted group or individual video education with verbal and written material and guidebook.  Patient learns the recommended Pritikin exercise program. Exercise with the goal of living a long, healthy life. Some of the health benefits of exercise include controlled diabetes, healthier blood pressure levels, improved cholesterol levels, improved heart and lung capacity, improved sleep, and better body composition. Everyone should speak with their doctor before starting or changing an exercise routine.  Biomechanical Limitations Clinical staff conducted group or individual video education with verbal and written material and guidebook.  Patient learns how biomechanical limitations can impact exercise and how we can mitigate and possibly overcome limitations to have an impactful and balanced exercise routine.  Body Composition Clinical staff conducted group or individual video education with verbal and written material and guidebook.  Patient learns that body composition (ratio of muscle mass to fat mass) is a key component to assessing overall fitness, rather than body weight alone. Increased fat mass, especially visceral belly fat, can put [place] at increased risk for metabolic syndrome, type 2 diabetes, heart disease, and even death. It is recommended to combine diet and exercise (cardiovascular and resistance training) to improve your body composition. Seek guidance from your physician and exercise  physiologist before implementing an exercise routine.  Exercise Action Plan Clinical staff conducted group or individual video education with verbal and written material and guidebook.  Patient learns the recommended strategies to achieve and enjoy long-term exercise adherence, including variety, self-motivation, self-efficacy, and positive decision making. Benefits of exercise include fitness, good health, weight management, more energy, better sleep, less stress, and overall well-being.  Medical   Heart Disease Risk Reduction Clinical staff conducted group or individual video education with verbal and written material and guidebook.  Patient learns our heart is our most vital organ as it circulates oxygen, nutrients, white blood cells, and hormones throughout the entire body, and carries waste away. Data supports a plant-based eating plan like the Pritikin Program for its effectiveness in slowing progression of and reversing heart disease. The video provides a number of recommendations to address heart disease.   Metabolic Syndrome and Belly Fat  Clinical staff conducted group or individual video education with verbal and written material and guidebook.  Patient learns what metabolic syndrome is, how it leads to heart disease, and how one can reverse it and keep it from coming back. You have metabolic syndrome if you have 3 of the following 5 criteria: abdominal obesity, high blood pressure, high triglycerides, low HDL cholesterol, and high blood sugar.  Hypertension and Heart Disease Clinical staff conducted group or  individual video education with verbal and written material and guidebook.  Patient learns that high blood pressure, or hypertension, is very common in the [place]. Hypertension is largely due to excessive salt intake, but other important risk factors include being overweight, physical inactivity, drinking too much alcohol, smoking, and not eating enough potassium from fruits  and vegetables. High blood pressure is a leading risk factor for heart attack, stroke, congestive heart failure, dementia, kidney failure, and premature death. Long-term effects of excessive salt intake include stiffening of the arteries and thickening of heart muscle and organ damage. Recommendations include ways to reduce hypertension and the risk of heart disease.  Diseases of Our Time - Focusing on Diabetes Clinical staff conducted group or individual video education with verbal and written material and guidebook.  Patient learns why the best way to stop diseases of our time is prevention, through food and other lifestyle changes. Medicine (such as prescription pills and surgeries) is often only a Band-Aid on the problem, not a long-term solution. Most common diseases of our time include obesity, type 2 diabetes, hypertension, heart disease, and cancer. The Pritikin Program is recommended and has been proven to help reduce, reverse, and/or prevent the damaging effects of metabolic syndrome.  Nutrition   Overview of the Pritikin Eating Plan  Clinical staff conducted group or individual video education with verbal and written material and guidebook.  Patient learns about the Pritikin Eating Plan for disease risk reduction. The Pritikin Eating Plan emphasizes a wide variety of unrefined, minimally-processed carbohydrates, like fruits, vegetables, whole grains, and legumes. Go, Caution, and Stop food choices are explained. Plant-based and lean animal proteins are emphasized. Rationale provided for low sodium intake for blood pressure control, low added sugars for blood sugar stabilization, and low added fats and oils for coronary artery disease risk reduction and weight management.  Calorie Density  Clinical staff conducted group or individual video education with verbal and written material and guidebook.  Patient learns about calorie density and how it impacts the Pritikin Eating Plan. Knowing the  characteristics of the food you choose will help you decide whether those foods will lead to weight gain or weight loss, and whether you want to consume more or less of them. Weight loss is usually a side effect of the Pritikin Eating Plan because of its focus on low calorie-dense foods.  Label Reading  Clinical staff conducted group or individual video education with verbal and written material and guidebook.  Patient learns about the Pritikin recommended label reading guidelines and corresponding recommendations regarding calorie density, added sugars, sodium content, and whole grains.  Dining Out - Part 1  Clinical staff conducted group or individual video education with verbal and written material and guidebook.  Patient learns that restaurant meals can be sabotaging because they can be so high in calories, fat, sodium, and/or sugar. Patient learns recommended strategies on how to positively address this and avoid unhealthy pitfalls.  Facts on Fats  Clinical staff conducted group or individual video education with verbal and written material and guidebook.  Patient learns that lifestyle modifications can be just as effective, if not more so, as many medications for lowering your risk of heart disease. A Pritikin lifestyle can help to reduce your risk of inflammation and atherosclerosis (cholesterol build-up, or plaque, in the artery walls). Lifestyle interventions such as dietary choices and physical activity address the cause of atherosclerosis. A review of the types of fats and their impact on blood cholesterol levels, along with dietary  recommendations to reduce fat intake is also included.  Nutrition Action Plan  Clinical staff conducted group or individual video education with verbal and written material and guidebook.  Patient learns how to incorporate Pritikin recommendations into their lifestyle. Recommendations include planning and keeping personal health goals in mind as an important  part of their success.  Healthy Mind-Set    Healthy Minds, Bodies, Hearts  Clinical staff conducted group or individual video education with verbal and written material and guidebook.  Patient learns how to identify when they are stressed. Video will discuss the impact of that stress, as well as the many benefits of stress management. Patient will also be introduced to stress management techniques. The way we think, act, and feel has an impact on our hearts.  How Our Thoughts Can Heal Our Hearts  Clinical staff conducted group or individual video education with verbal and written material and guidebook.  Patient learns that negative thoughts can cause depression and anxiety. This can result in negative lifestyle behavior and serious health problems. Cognitive behavioral therapy is an effective method to help control our thoughts in order to change and improve our emotional outlook.  Additional Videos:  Exercise    Improving Performance  Clinical staff conducted group or individual video education with verbal and written material and guidebook.  Patient learns to use a non-linear approach by alternating intensity levels and lengths of time spent exercising to help burn more calories and lose more body fat. Cardiovascular exercise helps improve heart health, metabolism, hormonal balance, blood sugar control, and recovery from fatigue. Resistance training improves strength, endurance, balance, coordination, reaction time, metabolism, and muscle mass. Flexibility exercise improves circulation, posture, and balance. Seek guidance from your physician and exercise physiologist before implementing an exercise routine and learn your capabilities and proper form for all exercise.  Introduction to Yoga  Clinical staff conducted group or individual video education with verbal and written material and guidebook.  Patient learns about yoga, a discipline of the coming together of mind, breath, and body. The  benefits of yoga include improved flexibility, improved range of motion, better posture and core strength, increased lung function, weight loss, and positive self-image. Yoga's heart health benefits include lowered blood pressure, healthier heart rate, decreased cholesterol and triglyceride levels, improved immune function, and reduced stress. Seek guidance from your physician and exercise physiologist before implementing an exercise routine and learn your capabilities and proper form for all exercise.  Medical   Aging: Enhancing Your Quality of Life  Clinical staff conducted group or individual video education with verbal and written material and guidebook.  Patient learns key strategies and recommendations to stay in good physical health and enhance quality of life, such as prevention strategies, having an advocate, securing a Health Care Proxy and Power of Attorney, and keeping a list of medications and system for tracking them. It also discusses how to avoid risk for bone loss.  Biology of Weight Control  Clinical staff conducted group or individual video education with verbal and written material and guidebook.  Patient learns that weight gain occurs because we consume more calories than we burn (eating more, moving less). Even if your body weight is normal, you may have higher ratios of fat compared to muscle mass. Too much body fat puts you at increased risk for cardiovascular disease, heart attack, stroke, type 2 diabetes, and obesity-related cancers. In addition to exercise, following the Pritikin Eating Plan can help reduce your risk.  Decoding Lab Results  Clinical staff conducted  group or individual video education with verbal and written material and guidebook.  Patient learns that lab test reflects one measurement whose values change over time and are influenced by many factors, including medication, stress, sleep, exercise, food, hydration, pre-existing medical conditions, and more. It is  recommended to use the knowledge from this video to become more involved with your lab results and evaluate your numbers to speak with your doctor.   Diseases of Our Time - Overview  Clinical staff conducted group or individual video education with verbal and written material and guidebook.  Patient learns that according to the CDC, 50% to 70% of chronic diseases (such as obesity, type 2 diabetes, elevated lipids, hypertension, and heart disease) are avoidable through lifestyle improvements including healthier food choices, listening to satiety cues, and increased physical activity.  Sleep Disorders Clinical staff conducted group or individual video education with verbal and written material and guidebook.  Patient learns how good quality and duration of sleep are important to overall health and well-being. Patient also learns about sleep disorders and how they impact health along with recommendations to address them, including discussing with a physician.  Nutrition  Dining Out - Part 2 Clinical staff conducted group or individual video education with verbal and written material and guidebook.  Patient learns how to plan ahead and communicate in order to maximize their dining experience in a healthy and nutritious manner. Included are recommended food choices based on the type of restaurant the patient is visiting.   Fueling a Banker conducted group or individual video education with verbal and written material and guidebook.  There is a strong connection between our food choices and our health. Diseases like obesity and type 2 diabetes are very prevalent and are in large-part due to lifestyle choices. The Pritikin Eating Plan provides plenty of food and hunger-curbing satisfaction. It is easy to follow, affordable, and helps reduce health risks.  Menu Workshop  Clinical staff conducted group or individual video education with verbal and written material and guidebook.   Patient learns that restaurant meals can sabotage health goals because they are often packed with calories, fat, sodium, and sugar. Recommendations include strategies to plan ahead and to communicate with the manager, chef, or server to help order a healthier meal.  Planning Your Eating Strategy  Clinical staff conducted group or individual video education with verbal and written material and guidebook.  Patient learns about the Pritikin Eating Plan and its benefit of reducing the risk of disease. The Pritikin Eating Plan does not focus on calories. Instead, it emphasizes high-quality, nutrient-rich foods. By knowing the characteristics of the foods, we choose, we can determine their calorie density and make informed decisions.  Targeting Your Nutrition Priorities  Clinical staff conducted group or individual video education with verbal and written material and guidebook.  Patient learns that lifestyle habits have a tremendous impact on disease risk and progression. This video provides eating and physical activity recommendations based on your personal health goals, such as reducing LDL cholesterol, losing weight, preventing or controlling type 2 diabetes, and reducing high blood pressure.  Vitamins and Minerals  Clinical staff conducted group or individual video education with verbal and written material and guidebook.  Patient learns different ways to obtain key vitamins and minerals, including through a recommended healthy diet. It is important to discuss all supplements you take with your doctor.   Healthy Mind-Set    Smoking Cessation  Clinical staff conducted group or individual video education  with verbal and written material and guidebook.  Patient learns that cigarette smoking and tobacco addiction pose a serious health risk which affects millions of people. Stopping smoking will significantly reduce the risk of heart disease, lung disease, and many forms of cancer. Recommended  strategies for quitting are covered, including working with your doctor to develop a successful plan.  Culinary   Becoming a Set designer conducted group or individual video education with verbal and written material and guidebook.  Patient learns that cooking at home can be healthy, cost-effective, quick, and puts them in control. Keys to cooking healthy recipes will include looking at your recipe, assessing your equipment needs, planning ahead, making it simple, choosing cost-effective seasonal ingredients, and limiting the use of added fats, salts, and sugars.  Cooking - Breakfast and Snacks  Clinical staff conducted group or individual video education with verbal and written material and guidebook.  Patient learns how important breakfast is to satiety and nutrition through the entire day. Recommendations include key foods to eat during breakfast to help stabilize blood sugar levels and to prevent overeating at meals later in the day. Planning ahead is also a key component.  Cooking - Educational psychologist conducted group or individual video education with verbal and written material and guidebook.  Patient learns eating strategies to improve overall health, including an approach to cook more at home. Recommendations include thinking of animal protein as a side on your plate rather than center stage and focusing instead on lower calorie dense options like vegetables, fruits, whole grains, and plant-based proteins, such as beans. Making sauces in large quantities to freeze for later and leaving the skin on your vegetables are also recommended to maximize your experience.  Cooking - Healthy Salads and Dressing Clinical staff conducted group or individual video education with verbal and written material and guidebook.  Patient learns that vegetables, fruits, whole grains, and legumes are the foundations of the Pritikin Eating Plan. Recommendations include how to  incorporate each of these in flavorful and healthy salads, and how to create homemade salad dressings. Proper handling of ingredients is also covered. Cooking - Soups and [employer] - Soups and Desserts Clinical staff conducted group or individual video education with verbal and written material and guidebook.  Patient learns that Pritikin soups and desserts make for easy, nutritious, and delicious snacks and meal components that are low in sodium, fat, sugar, and calorie density, while high in vitamins, minerals, and filling fiber. Recommendations include simple and healthy ideas for soups and desserts.   Overview     The Pritikin Solution Program Overview Clinical staff conducted group or individual video education with verbal and written material and guidebook.  Patient learns that the results of the Pritikin Program have been documented in more than 100 articles published in peer-reviewed journals, and the benefits include reducing risk factors for (and, in some cases, even reversing) high cholesterol, high blood pressure, type 2 diabetes, obesity, and more! An overview of the three key pillars of the Pritikin Program will be covered: eating well, doing regular exercise, and having a healthy mind-set.  WORKSHOPS  Exercise: Exercise Basics: Building Your Action Plan Clinical staff led group instruction and group discussion with PowerPoint presentation and patient guidebook. To enhance the learning environment the use of posters, models and videos may be added. At the conclusion of this workshop, patients will comprehend the difference between physical activity and exercise, as well as the benefits of incorporating  both, into their routine. Patients will understand the FITT (Frequency, Intensity, Time, and Type) principle and how to use it to build an exercise action plan. In addition, safety concerns and other considerations for exercise and cardiac rehab will be addressed by the  presenter. The purpose of this lesson is to promote a comprehensive and effective weekly exercise routine in order to improve patients' overall level of fitness.   Managing Heart Disease: Your Path to a Healthier Heart Clinical staff led group instruction and group discussion with PowerPoint presentation and patient guidebook. To enhance the learning environment the use of posters, models and videos may be added.At the conclusion of this workshop, patients will understand the anatomy and physiology of the heart. Additionally, they will understand how Pritikin's three pillars impact the risk factors, the progression, and the management of heart disease.  The purpose of this lesson is to provide a high-level overview of the heart, heart disease, and how the Pritikin lifestyle positively impacts risk factors.  Exercise Biomechanics Clinical staff led group instruction and group discussion with PowerPoint presentation and patient guidebook. To enhance the learning environment the use of posters, models and videos may be added. Patients will learn how the structural parts of their bodies function and how these functions impact their daily activities, movement, and exercise. Patients will learn how to promote a neutral spine, learn how to manage pain, and identify ways to improve their physical movement in order to promote healthy living. The purpose of this lesson is to expose patients to common physical limitations that impact physical activity. Participants will learn practical ways to adapt and manage aches and pains, and to minimize their effect on regular exercise. Patients will learn how to maintain good posture while sitting, walking, and lifting.  Balance Training and Fall Prevention  Clinical staff led group instruction and group discussion with PowerPoint presentation and patient guidebook. To enhance the learning environment the use of posters, models and videos may be added. At the  conclusion of this workshop, patients will understand the importance of their sensorimotor skills (vision, proprioception, and the vestibular system) in maintaining their ability to balance as they age. Patients will apply a variety of balancing exercises that are appropriate for their current level of function. Patients will understand the common causes for poor balance, possible solutions to these problems, and ways to modify their physical environment in order to minimize their fall risk. The purpose of this lesson is to teach patients about the importance of maintaining balance as they age and ways to minimize their risk of falling.  WORKSHOPS   Nutrition:  Fueling a Ship broker led group instruction and group discussion with PowerPoint presentation and patient guidebook. To enhance the learning environment the use of posters, models and videos may be added. Patients will review the foundational principles of the Pritikin Eating Plan and understand what constitutes a serving size in each of the food groups. Patients will also learn Pritikin-friendly foods that are better choices when away from home and review make-ahead meal and snack options. Calorie density will be reviewed and applied to three nutrition priorities: weight maintenance, weight loss, and weight gain. The purpose of this lesson is to reinforce (in a group setting) the key concepts around what patients are recommended to eat and how to apply these guidelines when away from home by planning and selecting Pritikin-friendly options. Patients will understand how calorie density may be adjusted for different weight management goals.  Mindful Eating  Clinical staff  led group instruction and group discussion with PowerPoint presentation and patient guidebook. To enhance the learning environment the use of posters, models and videos may be added. Patients will briefly review the concepts of the Pritikin Eating Plan and the  importance of low-calorie dense foods. The concept of mindful eating will be introduced as well as the importance of paying attention to internal hunger signals. Triggers for non-hunger eating and techniques for dealing with triggers will be explored. The purpose of this lesson is to provide patients with the opportunity to review the basic principles of the Pritikin Eating Plan, discuss the value of eating mindfully and how to measure internal cues of hunger and fullness using the Hunger Scale. Patients will also discuss reasons for non-hunger eating and learn strategies to use for controlling emotional eating.  Targeting Your Nutrition Priorities Clinical staff led group instruction and group discussion with PowerPoint presentation and patient guidebook. To enhance the learning environment the use of posters, models and videos may be added. Patients will learn how to determine their genetic susceptibility to disease by reviewing their family history. Patients will gain insight into the importance of diet as part of an overall healthy lifestyle in mitigating the impact of genetics and other environmental insults. The purpose of this lesson is to provide patients with the opportunity to assess their personal nutrition priorities by looking at their family history, their own health history and current risk factors. Patients will also be able to discuss ways of prioritizing and modifying the Pritikin Eating Plan for their highest risk areas  Menu  Clinical staff led group instruction and group discussion with PowerPoint presentation and patient guidebook. To enhance the learning environment the use of posters, models and videos may be added. Using menus brought in from [employer], or printed from [employer], patients will apply the Pritikin dining out guidelines that were presented in the [employer] video. Patients will also be able to practice these guidelines in a variety of  provided scenarios. The purpose of this lesson is to provide patients with the opportunity to practice hands-on learning of the Pritikin Dining Out guidelines with actual menus and practice scenarios.  Label Reading Clinical staff led group instruction and group discussion with PowerPoint presentation and patient guidebook. To enhance the learning environment the use of posters, models and videos may be added. Patients will review and discuss the Pritikin label reading guidelines presented in Pritikin's Label Reading Educational series video. Using fool labels brought in from local grocery stores and markets, patients will apply the label reading guidelines and determine if the packaged food meet the Pritikin guidelines. The purpose of this lesson is to provide patients with the opportunity to review, discuss, and practice hands-on learning of the Pritikin Label Reading guidelines with actual packaged food labels. Cooking School  Pritikin's [employer] are designed to teach patients ways to prepare quick, simple, and affordable recipes at home. The importance of nutrition's role in chronic disease risk reduction is reflected in its emphasis in the overall Pritikin program. By learning how to prepare essential core Pritikin Eating Plan recipes, patients will increase control over what they eat; be able to customize the flavor of foods without the use of added salt, sugar, or fat; and improve the quality of the food they consume. By learning a set of core recipes which are easily assembled, quickly prepared, and affordable, patients are more likely to prepare more healthy foods at home. These workshops focus on convenient breakfasts,  simple entres, side dishes, and desserts which can be prepared with minimal effort and are consistent with nutrition recommendations for cardiovascular risk reduction. Cooking [employer] are taught by a Armed forces logistics/support/administrative officer (RD) who has been trained by the  [employer]. The chef or RD has a clear understanding of the importance of minimizing - if not completely eliminating - added fat, sugar, and sodium in recipes. Throughout the series of Cooking School Workshop sessions, patients will learn about healthy ingredients and efficient methods of cooking to build confidence in their capability to prepare    Cooking School weekly topics:  Adding Flavor- Sodium-Free  Fast and Healthy Breakfasts  Powerhouse Plant-Based Proteins  Satisfying Salads and Dressings  Simple Sides and Sauces  International Cuisine-Spotlight on the [employer] Zones  Delicious Desserts  Savory Soups  [employer] - Meals in a Astronomer Appetizers and Snacks  Comforting Weekend Breakfasts  One-Pot Wonders   Fast Evening Meals  Landscape architect Your Pritikin Plate  WORKSHOPS   Healthy Mindset (Psychosocial):  Focused Goals, Sustainable Changes Clinical staff led group instruction and group discussion with PowerPoint presentation and patient guidebook. To enhance the learning environment the use of posters, models and videos may be added. Patients will be able to apply effective goal setting strategies to establish at least one personal goal, and then take consistent, meaningful action toward that goal. They will learn to identify common barriers to achieving personal goals and develop strategies to overcome them. Patients will also gain an understanding of how our mind-set can impact our ability to achieve goals and the importance of cultivating a positive and growth-oriented mind-set. The purpose of this lesson is to provide patients with a deeper understanding of how to set and achieve personal goals, as well as the tools and strategies needed to overcome common obstacles which may arise along the way.  From Head to Heart: The Power of a Healthy Outlook  Clinical staff led group instruction and group discussion with PowerPoint presentation  and patient guidebook. To enhance the learning environment the use of posters, models and videos may be added. Patients will be able to recognize and describe the impact of emotions and mood on physical health. They will discover the importance of self-care and explore self-care practices which may work for them. Patients will also learn how to utilize the 4 C's to cultivate a healthier outlook and better manage stress and challenges. The purpose of this lesson is to demonstrate to patients how a healthy outlook is an essential part of maintaining good health, especially as they continue their cardiac rehab journey.  Healthy Sleep for a Healthy Heart Clinical staff led group instruction and group discussion with PowerPoint presentation and patient guidebook. To enhance the learning environment the use of posters, models and videos may be added. At the conclusion of this workshop, patients will be able to demonstrate knowledge of the importance of sleep to overall health, well-being, and quality of life. They will understand the symptoms of, and treatments for, common sleep disorders. Patients will also be able to identify daytime and nighttime behaviors which impact sleep, and they will be able to apply these tools to help manage sleep-related challenges. The purpose of this lesson is to provide patients with a general overview of sleep and outline the importance of quality sleep. Patients will learn about a few of the most common sleep disorders. Patients will also be introduced to the concept of "sleep hygiene," and discover  ways to self-manage certain sleeping problems through simple daily behavior changes. Finally, the workshop will motivate patients by clarifying the links between quality sleep and their goals of heart-healthy living.   Recognizing and Reducing Stress Clinical staff led group instruction and group discussion with PowerPoint presentation and patient guidebook. To enhance the learning  environment the use of posters, models and videos may be added. At the conclusion of this workshop, patients will be able to understand the types of stress reactions, differentiate between acute and chronic stress, and recognize the impact that chronic stress has on their health. They will also be able to apply different coping mechanisms, such as reframing negative self-talk. Patients will have the opportunity to practice a variety of stress management techniques, such as deep abdominal breathing, progressive muscle relaxation, and/or guided imagery.  The purpose of this lesson is to educate patients on the role of stress in their lives and to provide healthy techniques for coping with it.  Learning Barriers/Preferences:  Learning Barriers/Preferences - 03/16/23 1346       Learning Barriers/Preferences   Learning Barriers Sight   wears reading glasses, chronic back pain   Learning Preferences Skilled Demonstration             Education Topics:  Knowledge Questionnaire Score:  Knowledge Questionnaire Score - 03/16/23 1355       Knowledge Questionnaire Score   Pre Score 25/28             Core Components/Risk Factors/Patient Goals at Admission:  Personal Goals and Risk Factors at Admission - 03/16/23 1311       Core Components/Risk Factors/Patient Goals on Admission    Weight Management Yes;Obesity;Weight Loss    Intervention Weight Management: Develop a combined nutrition and exercise program designed to reach desired caloric intake, while maintaining appropriate intake of nutrient and fiber, sodium and fats, and appropriate energy expenditure required for the weight goal.;Weight Management/Obesity: Establish reasonable short term and long term weight goals.;Obesity: Provide education and appropriate resources to help participant work on and attain dietary goals.    Admit Weight 176 lb 5.9 oz (80 kg)    Expected Outcomes Short Term: Continue to assess and modify interventions  until short term weight is achieved;Long Term: Adherence to nutrition and physical activity/exercise program aimed toward attainment of established weight goal;Weight Loss: Understanding of general recommendations for a balanced deficit meal plan, which promotes 1-2 lb weight loss per week and includes a negative energy balance of [phone] kcal/d    Tobacco Cessation Yes    Number of packs per day 1 pack per day    Intervention Assist the participant in steps to quit. Provide individualized education and counseling about committing to Tobacco Cessation, relapse prevention, and pharmacological support that can be provided by physician.;Education officer, environmental, assist with locating and accessing local/national Quit Smoking programs, and support quit date choice.    Expected Outcomes Short Term: Will demonstrate readiness to quit, by selecting a quit date.;Long Term: Complete abstinence from all tobacco products for at least 12 months from quit date.;Short Term: Will quit all tobacco product use, adhering to prevention of relapse plan.    Hypertension Yes    Intervention Provide education on lifestyle modifcations including regular physical activity/exercise, weight management, moderate sodium restriction and increased consumption of fresh fruit, vegetables, and low fat dairy, alcohol moderation, and smoking cessation.;Monitor prescription use compliance.    Expected Outcomes Short Term: Continued assessment and intervention until BP is < 140/20mm HG in hypertensive participants. <  130/41mm HG in hypertensive participants with diabetes, heart failure or chronic kidney disease.;Long Term: Maintenance of blood pressure at goal levels.    Lipids Yes    Intervention Provide education and support for participant on nutrition & aerobic/resistive exercise along with prescribed medications to achieve LDL 70mg , HDL >40mg .    Expected Outcomes Short Term: Participant states understanding of desired cholesterol  values and is compliant with medications prescribed. Participant is following exercise prescription and nutrition guidelines.;Long Term: Cholesterol controlled with medications as prescribed, with individualized exercise RX and with personalized nutrition plan. Value goals: LDL < 70mg , HDL > 40 mg.    Stress Yes    Intervention Offer individual and/or small group education and counseling on adjustment to heart disease, stress management and health-related lifestyle change. Teach and support self-help strategies.;Refer participants experiencing significant psychosocial distress to appropriate mental health specialists for further evaluation and treatment. When possible, include family members and significant others in education/counseling sessions.    Expected Outcomes Short Term: Participant demonstrates changes in health-related behavior, relaxation and other stress management skills, ability to obtain effective social support, and compliance with psychotropic medications if prescribed.;Long Term: Emotional wellbeing is indicated by absence of clinically significant psychosocial distress or social isolation.             Core Components/Risk Factors/Patient Goals Review:    Core Components/Risk Factors/Patient Goals at Discharge (Final Review):    ITP Comments:  ITP Comments     Row Name 03/16/23 1309           ITP Comments Medical Director- Dr. Armanda Magic, MD. Introduction to the [facility] / Intensive Cardiac Rehab. Reviewed orientation folder with patient.                Comments: Emberlee attended orientation for the cardiac rehabilitation program on  03/16/2023  to perform initial intake and exercise walk test. She was introduced to the [employer] education and orientation packet was reviewed. Completed 6-minute walk test, measurements, initial ITP, and exercise prescription. Vital signs stable. Telemetry-normal sinus rhythm, asymptomatic.   Service time  was from 1307 to 1449. Artist Pais, MS, ACSM CEP 03/16/23 [phone]

## 2023-03-16 NOTE — Progress Notes (Signed)
Cardiac Rehab Medication Review by a Nurse  Does the patient  feel that his/her medications are working for him/her?  yes  Has the patient been experiencing any side effects to the medications prescribed?  no  Does the patient measure his/her own blood pressure or blood glucose at home?  yes   Does the patient have any problems obtaining medications due to transportation or finances?   no  Understanding of regimen: excellent Understanding of indications: excellent Potential of compliance: good    Nurse comments: Saunders Glance is taking her medications as prescribed and has a good understanding of what her medications are for. Anissa checks her blood pressures daily.    Arta Bruce Tambra Muller RN 03/16/2023 1:52 PM

## 2023-03-22 ENCOUNTER — Encounter (HOSPITAL_COMMUNITY)
Admission: RE | Admit: 2023-03-22 | Discharge: 2023-03-22 | Disposition: A | Discharge status: Home / Self Care | Payer: BC Managed Care – PPO | Source: Ambulatory Visit | Attending: Cardiovascular Disease

## 2023-03-22 DIAGNOSIS — Z951 Presence of aortocoronary bypass graft: Secondary | ICD-10-CM | POA: Diagnosis not present

## 2023-03-22 NOTE — Progress Notes (Signed)
Daily Session Note  Patient Details  Name: [name] MRN: [id number] Date of Birth: April 01, 1971 Referring Provider:   Flowsheet Row INTENSIVE CARDIAC REHAB ORIENT from 03/16/2023 in [facility]  Referring Provider Kathleene Hazel, MD.       Encounter Date: 03/22/2023  Check In:  Session Check In - 03/22/23 1118       Check-In   Supervising physician immediately available to respond to emergencies [facility] MD immediately available    Physician(s) Joni Reining NP    Location [facility]    Staff Present Cristy Hilts, MS, ACSM-CEP, Exercise Physiologist;Ahyana Skillin, RN, BSN;Johnny Hale Bogus, MS, Exercise Physiologist;Jetta Dan Humphreys BS, ACSM-CEP, Exercise Physiologist;David Manus Gunning, MS, ACSM-CEP, CCRP, Exercise Physiologist;Kaylee Earlene Plater, MS, ACSM-CEP, Exercise Physiologist    Virtual Visit No    Medication changes reported     No    Fall or balance concerns reported    No    Tobacco Cessation No Change    Warm-up and Cool-down Performed as group-led instruction    Resistance Training Performed Yes    VAD Patient? No    PAD/SET Patient? No      Pain Assessment   Currently in Pain? No/denies    Multiple Pain Sites No             Capillary Blood Glucose: No results found for this or any previous visit (from the past 24 hour(s)).   Exercise Prescription Changes - 03/22/23 1032       Response to Exercise   Blood Pressure (Admit) 130/90    Blood Pressure (Exercise) 154/82    Blood Pressure (Exit) 146/92    Heart Rate (Admit) 91 bpm    Heart Rate (Exercise) 111 bpm    Heart Rate (Exit) 100 bpm    Rating of Perceived Exertion (Exercise) 10.5    Symptoms None    Comments Off to a good start with exercise.    Duration Continue with 30 min of aerobic exercise without signs/symptoms of physical distress.    Intensity THRR unchanged      Progression   Progression Continue to progress  workloads to maintain intensity without signs/symptoms of physical distress.    Average METs 2      Resistance Training   Training Prescription Yes    Weight 3 lbs    Reps 10-15    Time 10 Minutes      Interval Training   Interval Training No      Recumbant Bike   Level 1    RPM 81    Watts 18    Minutes 15    METs 2.2      NuStep   Level 2    SPM 66    Minutes 15    METs 1.8             Social History   Tobacco Use  Smoking Status Every Day   Current packs/day: 1.00   Average packs/day: 1 pack/day for 30.7 years (30.7 ttl pk-yrs)   Types: Cigarettes   Start date: 1994  Smokeless Tobacco Never    Goals Met:  Exercise tolerated well No report of concerns or symptoms today Strength training completed today  Goals Unmet:  Not Applicable  Comments: Pt started cardiac rehab today.  Pt tolerated light exercise without difficulty. VSS, telemetry-Sinus Rhythm, asymptomatic.  Medication list reconciled. Pt denies barriers to medicaiton compliance.  PSYCHOSOCIAL ASSESSMENT:  PHQ-9. Pt exhibits positive coping  skills, hopeful outlook with supportive family. No psychosocial needs identified at this time, no psychosocial interventions necessary.    Pt enjoys fishing, crabbing, shrimp and boating..   Pt oriented to exercise equipment and routine.    Understanding verbalized. Thayer Headings RN BSN    Dr. Armanda Magic is Medical Director for Cardiac Rehab at [facility].

## 2023-03-24 ENCOUNTER — Encounter (HOSPITAL_COMMUNITY)
Admission: RE | Admit: 2023-03-24 | Discharge: 2023-03-24 | Disposition: A | Discharge status: Home / Self Care | Payer: BC Managed Care – PPO | Source: Ambulatory Visit | Attending: Cardiovascular Disease | Admitting: Cardiovascular Disease

## 2023-03-24 DIAGNOSIS — Z951 Presence of aortocoronary bypass graft: Secondary | ICD-10-CM

## 2023-03-24 NOTE — Progress Notes (Signed)
Became nauseated on the nustep. Diaphoretic. Blood pressure 142/70 heart rate 98. [name] went into the bathroom, dry heaved and said that she vomited. Telemetry rhythm Sinus 98. Patient given a cool cloth exercise stopped. [name] says that she has been having getting nauseated almost daily over the past few months. Reports having some diarrhea. [name] reports having left side abdominal cramping and left shoulder cramping. Denies chest pain. After going to the bathroom diaphoresis improved. Cramping continues. Denies having any chest pain. Patient's GI physician, Dr Marge Duncans office called and notified. Dr Marge Duncans office spoke with the patient, they wil try to contact the patient and get her an earlier appointment. Onsite provider Edd Fabian NP notified about today's symptoms. Per Edd Fabian NP I agree with following up with GI. As long as she feels well she may continue to exercise and come to rehab.   Patient agreeable to the plan.Will continue to monitor the patient throughout  the program. Thayer Headings RN BSN

## 2023-03-25 ENCOUNTER — Other Ambulatory Visit: Payer: Self-pay

## 2023-03-25 ENCOUNTER — Encounter (HOSPITAL_COMMUNITY): Payer: Self-pay

## 2023-03-25 ENCOUNTER — Emergency Department (HOSPITAL_COMMUNITY)
Admission: EM | Admit: 2023-03-25 | Discharge: 2023-03-25 | Disposition: A | Discharge status: Home / Self Care | Payer: BC Managed Care – PPO | Attending: Emergency Medicine | Admitting: Emergency Medicine

## 2023-03-25 ENCOUNTER — Emergency Department (HOSPITAL_COMMUNITY): Payer: BC Managed Care – PPO

## 2023-03-25 DIAGNOSIS — Z79899 Other long term (current) drug therapy: Secondary | ICD-10-CM | POA: Diagnosis not present

## 2023-03-25 DIAGNOSIS — R1012 Left upper quadrant pain: Secondary | ICD-10-CM | POA: Diagnosis present

## 2023-03-25 DIAGNOSIS — K2950 Unspecified chronic gastritis without bleeding: Secondary | ICD-10-CM | POA: Insufficient documentation

## 2023-03-25 LAB — COMPREHENSIVE METABOLIC PANEL
ALT: 16 U/L (ref 0–44)
AST: 14 U/L — ABNORMAL LOW (ref 15–41)
Albumin: 3.6 g/dL (ref 3.5–5.0)
Alkaline Phosphatase: 116 U/L (ref 38–126)
Anion gap: 13 (ref 5–15)
BUN: 5 mg/dL — ABNORMAL LOW (ref 6–20)
CO2: 23 mmol/L (ref 22–32)
Calcium: 8.8 mg/dL — ABNORMAL LOW (ref 8.9–10.3)
Chloride: 101 mmol/L (ref 98–111)
Creatinine, Ser: 0.61 mg/dL (ref 0.44–1.00)
GFR, Estimated: >60 mL/min (ref >60)
Glucose, Bld: 105 mg/dL — ABNORMAL HIGH (ref 70–99)
Potassium: 4.3 mmol/L (ref 3.5–5.1)
Sodium: 137 mmol/L (ref 135–145)
Total Bilirubin: 0.5 mg/dL (ref 0.3–1.2)
Total Protein: 6.1 g/dL — ABNORMAL LOW (ref 6.5–8.1)

## 2023-03-25 LAB — LIPASE, BLOOD: Lipase: 27 U/L (ref 11–51)

## 2023-03-25 LAB — CBC WITH DIFFERENTIAL/PLATELET
Abs Immature Granulocytes: 0.03 10*3/uL (ref 0.00–0.07)
Basophils Absolute: 0.1 10*3/uL (ref 0.0–0.1)
Basophils Relative: 1 %
Eosinophils Absolute: 0.2 10*3/uL (ref 0.0–0.5)
Eosinophils Relative: 2 %
HCT: 41.5 % (ref 36.0–46.0)
Hemoglobin: 13.4 g/dL (ref 12.0–15.0)
Immature Granulocytes: 0 %
Lymphocytes Relative: 15 %
Lymphs Abs: 1.7 10*3/uL (ref 0.7–4.0)
MCH: 28.5 pg (ref 26.0–34.0)
MCHC: 32.3 g/dL (ref 30.0–36.0)
MCV: 88.1 fL (ref 80.0–100.0)
Monocytes Absolute: 0.6 10*3/uL (ref 0.1–1.0)
Monocytes Relative: 6 %
Neutro Abs: 8.8 10*3/uL — ABNORMAL HIGH (ref 1.7–7.7)
Neutrophils Relative %: 76 %
Platelets: 271 10*3/uL (ref 150–400)
RBC: 4.71 MIL/uL (ref 3.87–5.11)
RDW: 16.1 % — ABNORMAL HIGH (ref 11.5–15.5)
WBC: 11.4 10*3/uL — ABNORMAL HIGH (ref 4.0–10.5)
nRBC: 0 % (ref 0.0–0.2)

## 2023-03-25 LAB — TROPONIN I (HIGH SENSITIVITY)
Troponin I (High Sensitivity): 15 ng/L (ref <18)
Troponin I (High Sensitivity): 14 ng/L (ref <18)

## 2023-03-25 MED ORDER — ONDANSETRON HCL 4 MG/2ML IJ SOLN
4.0000 mg | Freq: Once | INTRAMUSCULAR | Status: AC
  Administered 2023-03-25: 4 mg via INTRAVENOUS
  Filled 2023-03-25: qty 2

## 2023-03-25 MED ORDER — HYDROMORPHONE HCL 1 MG/ML IJ SOLN
1.0000 mg | Freq: Once | INTRAMUSCULAR | Status: AC
  Administered 2023-03-25: 1 mg via INTRAVENOUS
  Filled 2023-03-25: qty 1

## 2023-03-25 MED ORDER — PANTOPRAZOLE SODIUM 40 MG IV SOLR
40.0000 mg | Freq: Once | INTRAVENOUS | Status: AC
  Administered 2023-03-25: 40 mg via INTRAVENOUS
  Filled 2023-03-25: qty 10

## 2023-03-25 MED ORDER — ACETAMINOPHEN 500 MG PO TABS
1000.0000 mg | ORAL_TABLET | Freq: Once | ORAL | Status: AC
  Administered 2023-03-25: 1000 mg via ORAL
  Filled 2023-03-25: qty 2

## 2023-03-25 NOTE — ED Provider Notes (Signed)
Pt signed out by Dr. Blinda Leatherwood pending 2nd troponin.  Pt's sx seem to be chronic.  She is worried that her stool is discolored.  Labs are unremarkable for anything acute.  2nd trop nl.  Sx appear chronic in nature.  She said carafate made her feel sick.  She is still on protonix.   Pt is very unhappy to go home.  She called me a "fucking bitch" for sending her home.  She is stable for d/c.  Return if worse.   Jacalyn Lefevre, MD 03/25/23 [phone]

## 2023-03-25 NOTE — ED Notes (Signed)
Pt extremely upset at discharge, refusing to let this paramedic remove her IV until the doctor came back in. Pt states the doctor "didn't tell me anything" Pt state she was "rude." Per EDP, pt yelled profanities at her calling her names. Security was called and pt did leave without incident after allowing me to remove her IV. Pt was very upset about the referral to GI, she was wanting a different doctor than she was referred to. Advised pt to contact her Primary and get another referral. This paramedic did go over her blood work results and her x-ray. Pt did thank me for my time and for being "nice"

## 2023-03-25 NOTE — ED Provider Notes (Signed)
[place] EMERGENCY DEPARTMENT AT [facility] Provider Note   CSN: [id number] Arrival date & time: 03/25/23  8657     History  Chief Complaint  Patient presents with   Abdominal Pain    [name] is a 52 y.o. female.  Patient presents to the emergency department for evaluation of nausea, vomiting, diarrhea.  Patient reports that symptoms have been ongoing for a month or more.  Blood pressures have been running high at home.  Patient comes in tonight with increased pain in the left upper abdomen that radiates into the left shoulder area.  She reports that she was hospitalized at [facility] long with similar symptoms recently.       Home Medications Prior to Admission medications   Medication Sig Start Date End Date Taking? Authorizing Provider  albuterol (VENTOLIN HFA) 108 (90 Base) MCG/ACT inhaler Inhale 2 puffs into the lungs every 4 (four) hours as needed for wheezing or shortness of breath. 10/15/22   Leary Roca, PA-C  amphetamine-dextroamphetamine (ADDERALL XR) 30 MG 24 hr capsule Take 60 mg by mouth daily.    [provider]  atorvastatin (LIPITOR) 80 MG tablet Take 1 tablet (80 mg total) by mouth daily. 09/09/22   Sharlene Dory, PA-C  cyanocobalamin (VITAMIN B12) 1000 MCG tablet Take 1,000 mcg by mouth daily.    [provider]  escitalopram (LEXAPRO) 10 MG tablet Take 10 mg by mouth daily as needed (anxiety).    [provider]  ferrous sulfate 325 (65 FE) MG EC tablet Take 325 mg by mouth daily. Takes as needed    [provider]  furosemide (LASIX) 20 MG tablet Take 1 tablet (20 mg total) by mouth daily as needed (for lower extremity edema). 11/02/22   Sharlene Dory, PA-C  gabapentin (NEURONTIN) 100 MG capsule Take 100-200 mg by mouth 4 (four) times daily as needed (pain).    [provider]  gabapentin (NEURONTIN) 300 MG capsule Take 600 mg by mouth daily as needed (pain).    [provider]   metoprolol succinate (TOPROL-XL) 50 MG 24 hr tablet Take 1 tablet (50 mg total) by mouth 2 (two) times daily. Take with or immediately following a meal. 02/25/23   Kathleene Hazel, MD  nicotine (NICODERM CQ - DOSED IN MG/24 HOURS) 21 mg/24hr patch Place 1 patch (21 mg total) onto the skin daily. Patient not taking: Reported on 03/24/2023 01/28/23   Meredeth Ide, MD  nitroGLYCERIN (NITROSTAT) 0.4 MG SL tablet Place 1 tablet (0.4 mg total) under the tongue every 5 (five) minutes as needed for chest pain. 07/24/22 05/21/23  Sharlene Dory, PA-C  pantoprazole (PROTONIX) 40 MG tablet Take 1 tablet (40 mg total) by mouth daily. Patient not taking: Reported on 03/24/2023 01/28/23 03/29/23  Meredeth Ide, MD  sucralfate (CARAFATE) 1 GM/10ML suspension Take 10 mLs (1 g total) by mouth 4 (four) times daily -  with meals and at bedtime for 14 days. 01/28/23 02/11/23  Meredeth Ide, MD  VITAMIN D, CHOLECALCIFEROL, PO Take 1 tablet by mouth daily.    [provider]  zolpidem (AMBIEN) 10 MG tablet Take 10 mg by mouth at bedtime.    [provider]      Allergies    Zoloft [sertraline]    Review of Systems   Review of Systems  Physical Exam Updated Vital Signs BP 128/84 (BP Location: Right Arm)   Pulse 96   Temp 97.8 F (36.6  C) (Oral)   Resp 20   Ht 5\' 2"  (1.575 m)   Wt 81.6 kg   SpO2 99%   BMI 32.92 kg/m  Physical Exam Vitals and nursing note reviewed.  Constitutional:      General: She is not in acute distress.    Appearance: She is well-developed.  HENT:     Head: Normocephalic and atraumatic.     Mouth/Throat:     Mouth: Mucous membranes are moist.  Eyes:     General: Vision grossly intact. Gaze aligned appropriately.     Extraocular Movements: Extraocular movements intact.     Conjunctiva/sclera: Conjunctivae normal.  Cardiovascular:     Rate and Rhythm: Normal rate and regular rhythm.     Pulses: Normal pulses.     Heart sounds: Normal heart sounds, S1 normal  and S2 normal. No murmur heard.    No friction rub. No gallop.  Pulmonary:     Effort: Pulmonary effort is normal. No respiratory distress.     Breath sounds: Normal breath sounds.  Abdominal:     General: Bowel sounds are normal.     Palpations: Abdomen is soft.     Tenderness: There is abdominal tenderness in the left upper quadrant. There is no guarding or rebound.     Hernia: No hernia is present.  Musculoskeletal:        General: No swelling.     Cervical back: Full passive range of motion without pain, normal range of motion and neck supple. No spinous process tenderness or muscular tenderness. Normal range of motion.     Right lower leg: No edema.     Left lower leg: No edema.  Skin:    General: Skin is warm and dry.     Capillary Refill: Capillary refill takes less than 2 seconds.     Findings: No ecchymosis, erythema, rash or wound.  Neurological:     General: No focal deficit present.     Mental Status: She is alert and oriented to person, place, and time.     GCS: GCS eye subscore is 4. GCS verbal subscore is 5. GCS motor subscore is 6.     Cranial Nerves: Cranial nerves 2-12 are intact.     Sensory: Sensation is intact.     Motor: Motor function is intact.     Coordination: Coordination is intact.  Psychiatric:        Attention and Perception: Attention normal.        Mood and Affect: Mood normal.        Speech: Speech normal.        Behavior: Behavior normal.     ED Results / Procedures / Treatments   Labs (all labs ordered are listed, but only abnormal results are displayed) Labs Reviewed  CBC WITH DIFFERENTIAL/PLATELET - Abnormal; Notable for the following components:      Result Value   WBC 11.4 (*)    RDW 16.1 (*)    Neutro Abs 8.8 (*)    All other components within normal limits  COMPREHENSIVE METABOLIC PANEL - Abnormal; Notable for the following components:   Glucose, Bld 105 (*)    BUN 5 (*)    Calcium 8.8 (*)    Total Protein 6.1 (*)    AST 14  (*)    All other components within normal limits  LIPASE, BLOOD  TROPONIN I (HIGH SENSITIVITY)    EKG EKG Interpretation Date/Time:  Thursday March 25 2023 05:26:07 EDT Ventricular Rate:  94 PR Interval:  164 QRS Duration:  88 QT Interval:  364 QTC Calculation: 456 R Axis:   39  Text Interpretation: Sinus rhythm Abnrm T, consider ischemia, anterolateral lds Minimal ST elevation, inferior leads Baseline wander in lead(s) V1 V2 No acute changes Confirmed by Gilda Crease [phone]) on 03/25/2023 5:30:16 AM  Radiology DG Chest Port 1 View  Result Date: 03/25/2023 CLINICAL DATA:  Encounter for chest pain EXAM: PORTABLE CHEST 1 VIEW COMPARISON:  01/26/2023 FINDINGS: Chronic cardiomegaly.  Prior CABG and left atrial clipping. Interstitial coarsening bilaterally. Lung volumes are low. No visible effusion or pneumothorax. IMPRESSION: Low volume chest with cardiomegaly and probable edema. Electronically Signed   By: Tiburcio Pea M.D.   On: 03/25/2023 05:46    Procedures Procedures    Medications Ordered in ED Medications  HYDROmorphone (DILAUDID) injection 1 mg (1 mg Intravenous Given 03/25/23 0543)  ondansetron (ZOFRAN) injection 4 mg (4 mg Intravenous Given 03/25/23 0542)  pantoprazole (PROTONIX) injection 40 mg (40 mg Intravenous Given 03/25/23 0559)    ED Course/ Medical Decision Making/ A&P                                 Medical Decision Making Amount and/or Complexity of Data Reviewed External Data Reviewed: labs, radiology, ECG and notes. Labs: ordered.    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  Hospitalized 1 month ago at [facility] long with identical left upper quadrant pain into left shoulder.  Cardiac evaluation was negative for acute changes.  Patient underwent GI workup including EGD that showed gastritis. Radiology: ordered and independent interpretation performed. Decision-making details documented in ED Course. ECG/medicine tests: ordered and independent  interpretation performed. Decision-making details documented in ED Course.  Risk Prescription drug management.   Differential Diagnosis considered includes, but not limited to: STEMI; NSTEMI; myocarditis; pericarditis; pulmonary embolism; aortic dissection; pneumothorax; pneumonia; gastritis; pancreatitis; musculoskeletal pain  Presents to the emergency department with nausea, vomiting, diarrhea, abdominal pain.  She reports that symptoms have been ongoing for a month or more.  Patient with increased pain in the left upper quadrant radiating into the left shoulder.  Review of her chart reveals a hospitalization last month with identical presentation.  She was diagnosed with gastritis at that time.  Symptoms were not felt to be cardiac.  She has been in cardiac rehab since bypass.  She saw cardiology for follow-up after her hospitalization and was felt to be doing well.  Recheck cardiac labs, abdominal labs.  Normal LFTs, normal lipase.  No significant anemia.  First troponin 15.  Symptomatic treatment.  Patient does not appear to be taking her PPI, suspect recurrence of her gastritis.  Will obtain delta troponin.  Signed out to oncoming ER physician.  Expect discharge if troponin negative.        Final Clinical Impression(s) / ED Diagnoses Final diagnoses:  Chronic gastritis without bleeding, unspecified gastritis type    Rx / DC Orders ED Discharge Orders     None         Gilda Crease, MD 03/25/23 [phone]

## 2023-03-25 NOTE — ED Triage Notes (Signed)
Pt arrives POV with complaints of LUQ pain radiating into her left arm, n/v/d and hypertension x 1 month. Pt was in the hospital for the same about two months ago. Pt has been following up with cardiology and rehab since being discharged. Pt taking bp at home ranging 160-180 SBP. Pt AOx4, ambulatory, resp EU

## 2023-03-26 ENCOUNTER — Encounter (HOSPITAL_COMMUNITY): Payer: BC Managed Care – PPO

## 2023-03-26 ENCOUNTER — Telehealth (HOSPITAL_COMMUNITY): Payer: Self-pay

## 2023-03-26 NOTE — Telephone Encounter (Signed)
Pt called about her appointment for today! She will not be coming back until after 9/19 due to seeing her GI doctor. She would like to hold off with all appointments until she has been seen on 9/19. I did inform her that I would cancel her appointments and could add them on to the end. She will now graduate on 12/4. PT asked if I could  "ask Byrd Hesselbach to read the note from the ED and inform her it was all a lie."

## 2023-03-29 ENCOUNTER — Encounter (HOSPITAL_COMMUNITY): Payer: BC Managed Care – PPO

## 2023-03-31 ENCOUNTER — Encounter (HOSPITAL_COMMUNITY): Payer: BC Managed Care – PPO

## 2023-04-01 ENCOUNTER — Telehealth: Payer: Self-pay | Admitting: *Deleted

## 2023-04-01 NOTE — Telephone Encounter (Signed)
Pre-operative Risk Assessment    Patient Name: [name]  DOB: 1971/06/04 MRN: [id number]  LAST OV: 02-25-23 [place] NEXT OV: 08-2023 [facility]     Request for Surgical Clearance    Procedure:   COLONOSCOPY  Date of Surgery:  Clearance 06/04/23                                 Surgeon:  [facility]  Surgeon's Group or Practice Name:   [facility]  Phone number:  [phone] Fax number:  [phone]   Type of Clearance Requested:   - Medical    Type of Anesthesia:  General    Additional requests/questions:   N/A   Freddy Jaksch   04/01/2023, 3:08 PM

## 2023-04-02 ENCOUNTER — Encounter (HOSPITAL_COMMUNITY): Payer: BC Managed Care – PPO

## 2023-04-02 NOTE — Telephone Encounter (Signed)
I left a message for the patient to call our office to schedule a tele visit for pre-op clearance.  

## 2023-04-02 NOTE — Telephone Encounter (Signed)
Name: [name]  DOB: 1971-06-09  MRN: [id number]  Primary Cardiologist: Verne Carrow, MD   Preoperative team, please contact this patient and set up a phone call appointment for further preoperative risk assessment. Please obtain consent and complete medication review. Thank you for your help.  I confirm that guidance regarding antiplatelet and oral anticoagulation therapy has been completed and, if necessary, noted below.  None requested.    Carlos Levering, NP 04/02/2023, 1:33 PM [facility]

## 2023-04-05 ENCOUNTER — Telehealth: Payer: Self-pay | Admitting: *Deleted

## 2023-04-05 ENCOUNTER — Encounter (HOSPITAL_COMMUNITY): Payer: BC Managed Care – PPO

## 2023-04-05 ENCOUNTER — Telehealth (HOSPITAL_COMMUNITY): Payer: Self-pay | Admitting: *Deleted

## 2023-04-05 NOTE — Telephone Encounter (Signed)
S/w pt is aware of tele appt time and date.  Med rec and consent done, tele # confirmed. Will remove from pre op pool.    Patient Consent for Virtual Visit         [name] has provided verbal consent on 04/05/2023 for a virtual visit (video or telephone).   CONSENT FOR VIRTUAL VISIT FOR:  [name]  By participating in this virtual visit I agree to the following:  I hereby voluntarily request, consent and authorize [facility] and its employed or contracted physicians, physician assistants, nurse practitioners or other licensed health care professionals (the Practitioner), to provide me with telemedicine health care services (the "Services") as deemed necessary by the treating Practitioner. I acknowledge and consent to receive the Services by the Practitioner via telemedicine. I understand that the telemedicine visit will involve communicating with the Practitioner through live audiovisual communication technology and the disclosure of certain medical information by electronic transmission. I acknowledge that I have been given the opportunity to request an in-person assessment or other available alternative prior to the telemedicine visit and am voluntarily participating in the telemedicine visit.  I understand that I have the right to withhold or withdraw my consent to the use of telemedicine in the course of my care at any time, without affecting my right to future care or treatment, and that the Practitioner or I may terminate the telemedicine visit at any time. I understand that I have the right to inspect all information obtained and/or recorded in the course of the telemedicine visit and may receive copies of available information for a reasonable fee.  I understand that some of the potential risks of receiving the Services via telemedicine include:  Delay or interruption in medical evaluation due to technological equipment failure or disruption; Information  transmitted may not be sufficient (e.g. poor resolution of images) to allow for appropriate medical decision making by the Practitioner; and/or  In rare instances, security protocols could fail, causing a breach of personal health information.  Furthermore, I acknowledge that it is my responsibility to provide information about my medical history, conditions and care that is complete and accurate to the best of my ability. I acknowledge that Practitioner's advice, recommendations, and/or decision may be based on factors not within their control, such as incomplete or inaccurate data provided by me or distortions of diagnostic images or specimens that may result from electronic transmissions. I understand that the practice of medicine is not an exact science and that Practitioner makes no warranties or guarantees regarding treatment outcomes. I acknowledge that a copy of this consent can be made available to me via my patient portal [facility] MyChart), or I can request a printed copy by calling the office of [facility].    I understand that my insurance will be billed for this visit.   I have read or had this consent read to me. I understand the contents of this consent, which adequately explains the benefits and risks of the Services being provided via telemedicine.  I have been provided ample opportunity to ask questions regarding this consent and the Services and have had my questions answered to my satisfaction. I give my informed consent for the services to be provided through the use of telemedicine in my medical care

## 2023-04-05 NOTE — Telephone Encounter (Signed)
Left message to call cardiac rehab.Thayer Headings RN BSN

## 2023-04-06 NOTE — Progress Notes (Signed)
Cardiac Individual Treatment Plan  Patient Details  Name: [name] MRN: [id number] Date of Birth: 02/09/1971 Referring Provider:   Flowsheet Row INTENSIVE CARDIAC REHAB ORIENT from 03/16/2023 in [facility]  Referring Provider Kathleene Hazel, MD.       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  Patient's Home Medications on Admission:  Current Outpatient Medications:    albuterol (VENTOLIN HFA) 108 (90 Base) MCG/ACT inhaler, Inhale 2 puffs into the lungs every 4 (four) hours as needed for wheezing or shortness of breath., Disp: 6.7 g, Rfl: 1   amphetamine-dextroamphetamine (ADDERALL XR) 30 MG 24 hr capsule, Take 60 mg by mouth daily., Disp: , Rfl:    atorvastatin (LIPITOR) 80 MG tablet, Take 1 tablet (80 mg total) by mouth daily., Disp: 90 tablet, Rfl: 3   cyanocobalamin (VITAMIN B12) 1000 MCG tablet, Take 1,000 mcg by mouth daily., Disp: , Rfl:    escitalopram (LEXAPRO) 10 MG tablet, Take 10 mg by mouth daily as needed (anxiety)., Disp: , Rfl:    ferrous sulfate 325 (65 FE) MG EC tablet, Take 325 mg by mouth daily. Takes as needed, Disp: , Rfl:    furosemide (LASIX) 20 MG tablet, Take 1 tablet (20 mg total) by mouth daily as needed (for lower extremity edema)., Disp: 45 tablet, Rfl: 3   gabapentin (NEURONTIN) 100 MG capsule, Take 100-200 mg by mouth 4 (four) times daily as needed (pain)., Disp: , Rfl:    gabapentin (NEURONTIN) 300 MG capsule, Take 600 mg by mouth daily as needed (pain)., Disp: , Rfl:    metoprolol succinate (TOPROL-XL) 50 MG 24 hr tablet, Take 1 tablet (50 mg total) by mouth 2 (two) times daily. Take with or immediately following a meal., Disp: 180 tablet, Rfl: 3   nitroGLYCERIN (NITROSTAT) 0.4 MG SL tablet, Place 1 tablet  (0.4 mg total) under the tongue every 5 (five) minutes as needed for chest pain., Disp: 25 tablet, Rfl: 3   VITAMIN D, CHOLECALCIFEROL, PO, Take 1 tablet by mouth daily., Disp: , Rfl:    zolpidem (AMBIEN) 10 MG tablet, Take 10 mg by mouth at bedtime., Disp: , Rfl:    dexamethasone (DECADRON) 1 MG tablet, SMARTSIG:1 Tablet(s) By Mouth, Disp: , Rfl:    dicyclomine (BENTYL) 20 MG tablet, Take by mouth., Disp: , Rfl:    nicotine (NICODERM CQ - DOSED IN MG/24 HOURS) 21 mg/24hr patch, Place 1 patch (21 mg total) onto the skin daily., Disp: 28 patch, Rfl: 0   omeprazole (PRILOSEC) 40 MG capsule, Take 40 mg by mouth in the morning and at bedtime., Disp: , Rfl:   Current Facility-Administered Medications:    lactulose (CHRONULAC) 10 GM/15ML solution 20 g, 20 g, Oral, Daily PRN, Alleen Borne, MD  Past Medical History: Past Medical History:  Diagnosis Date   A-fib (HCC)    Anxiety    Arthralgia    Asthma    Attention-deficit hyperactivity disorder, predominantly inattentive type    Back pain    Coronary artery disease    Dysrhythmia    Afib   Fear of flying    Fibromyalgia    denies having Fibromyalgia   Gastroesophageal reflux disease    HPV in female    Joint pain  Localized edema    Neck pain    OA (osteoarthritis)    Papillomavirus as the cause of diseases classified elsewhere    Primary insomnia    Pure hypercholesterolemia    Rheumatoid arthritis, seronegative, hand, unspecified laterality (HCC)    patient feels it is back related, not a true arthritis   Spondylolisthesis of lumbosacral region    Tobacco dependence    Unspecified inflammatory spondylopathy, cervical region (HCC)    Vitamin B12 deficiency anemia    Voice hoarseness     Tobacco Use: Social History   Tobacco Use  Smoking Status Every Day   Current packs/day: 1.00   Average packs/day: 1 pack/day for 30.7 years (30.7 ttl pk-yrs)   Types: Cigarettes   Start date: 1994  Smokeless Tobacco Never     Labs: Review Flowsheet  More data may exist      Latest Ref Rng & Units 09/01/2022 09/07/2022 10/06/2022 10/08/2022 11/16/2022  Labs for ITP Cardiac and Pulmonary Rehab  Cholestrol 100 - 199 mg/dL 629  - - - 528   LDL (calc) 0 - 99 mg/dL 413  - - - 65   HDL-C >24 mg/dL 60  - - - 41   Trlycerides 0 - 149 mg/dL 401  - - - 027   Hemoglobin A1c 4.8 - 5.6 % - - 5.8  - -  PH, Arterial 7.35 - 7.45 - 7.390  7.55  7.347  7.293  7.308  7.196  7.307  7.319  7.377  7.395  7.443  7.427  7.381  -  PCO2 arterial 32 - 48 mmHg - 39.9  24  45.9  52.8  48.6  62.5  46.6  46.1  39.3  39.3  33.1  36.0  38.8  -  Bicarbonate 20.0 - 28.0 mmol/L - 24.2  24.8  21.0  25.0  25.5  24.4  24.3  23.3  23.7  23.1  24.1  22.6  23.7  25.4  23.0  -  TCO2 22 - 32 mmol/L - 25  26  - 26  27  26  26  25  23  25  26  24  25  26  24  25  25  27  24  26  29   -  Acid-base deficit 0.0 - 2.0 mmol/L - 1.0  1.0  - 1.0  1.0  2.0  4.0  3.0  2.0  2.0  1.0  1.0  1.0  2.0  -  O2 Saturation % - 93  61  100  96  92  98  93  100  92  100  100  100  100  68  100  -    Details       Multiple values from one day are sorted in reverse-chronological order         Capillary Blood Glucose: Lab Results  Component Value Date   GLUCAP 101 (H) 01/26/2023   GLUCAP 109 (H) 10/12/2022   GLUCAP 118 (H) 10/12/2022   GLUCAP 111 (H) 10/12/2022   GLUCAP 111 (H) 10/11/2022     Exercise Target Goals: Exercise Program Goal: Individual exercise prescription set using results from initial 6 min walk test and THRR while considering  patient's activity barriers and safety.   Exercise Prescription Goal: Initial exercise prescription builds to 30-45 minutes a day of aerobic activity, 2-3 days per week.  Home exercise guidelines will be given to patient during program as part of exercise prescription that the  participant will acknowledge.  Activity Barriers & Risk Stratification:  Activity Barriers & Cardiac Risk Stratification - 03/16/23 1345        Activity Barriers & Cardiac Risk Stratification   Activity Barriers Back Problems;Other (comment);Neck/Spine Problems    Comments Chronic back pain, neck pain, lumbar fusion    Cardiac Risk Stratification High             6 Minute Walk:  6 Minute Walk     Row Name 03/16/23 1436         6 Minute Walk   Phase Initial     Distance 2033 feet     Walk Time 6 minutes     # of Rest Breaks 0     MPH 3.85     METS 5.19     RPE 12     Perceived Dyspnea  1     VO2 Peak 18.17     Symptoms Yes (comment)     Comments Mild shortness of breath. Mild headache, which she rated 2/10 on the pain scale after the walk test.     Resting HR 99 bpm     Resting BP 108/64     Resting Oxygen Saturation  98 %     Exercise Oxygen Saturation  during 6 min walk 100 %     Max Ex. HR 119 bpm     Max Ex. BP 146/80     2 Minute Post BP 128/82              Oxygen Initial Assessment:   Oxygen Re-Evaluation:   Oxygen Discharge (Final Oxygen Re-Evaluation):   Initial Exercise Prescription:  Initial Exercise Prescription - 03/16/23 1500       Date of Initial Exercise RX and Referring Provider   Date 03/16/23    Referring Provider Kathleene Hazel, MD.    Expected Discharge Date 06/09/23      Recumbant Bike   Level 1    Watts 50    Minutes 15    METs 2.9      NuStep   Level 2    SPM 85    Minutes 15    METs 2.5      Prescription Details   Frequency (times per week) 3    Duration Progress to 30 minutes of continuous aerobic without signs/symptoms of physical distress      Intensity   THRR 40-80% of Max Heartrate 67-134    Ratings of Perceived Exertion 11-13    Perceived Dyspnea 0-4      Progression   Progression Continue to progress workloads to maintain intensity without signs/symptoms of physical distress.      Resistance Training   Training Prescription Yes    Weight 3 lbs    Reps 10-15             Perform Capillary Blood Glucose checks as  needed.  Exercise Prescription Changes:   Exercise Prescription Changes     Row Name 03/22/23 1032 03/24/23 1036           Response to Exercise   Blood Pressure (Admit) 130/90 142/90      Blood Pressure (Exercise) 154/82 142/80      Blood Pressure (Exit) 146/92 128/76      Heart Rate (Admit) 91 bpm 96 bpm      Heart Rate (Exercise) 111 bpm 108 bpm      Heart Rate (Exit) 100 bpm 95 bpm      Rating of  Perceived Exertion (Exercise) 10.5 8.5      Symptoms None Anissa became nauseated after her 1st station and c/o around left rib, shoulder.      Comments Off to a good start with exercise. Exercise discontinued after the first station. Onsite provider consulted.      Duration Continue with 30 min of aerobic exercise without signs/symptoms of physical distress. Progress to 30 minutes of  aerobic without signs/symptoms of physical distress      Intensity THRR unchanged THRR unchanged        Progression   Progression Continue to progress workloads to maintain intensity without signs/symptoms of physical distress. Continue to progress workloads to maintain intensity without signs/symptoms of physical distress.      Average METs 2 2        Resistance Training   Training Prescription Yes No  Exercised discontinued due to nausea and cramping.      Weight 3 lbs --      Reps 10-15 --      Time 10 Minutes --        Interval Training   Interval Training No No        Recumbant Bike   Level 1 --      RPM 81 --      Watts 18 --      Minutes 15 --      METs 2.2 --        NuStep   Level 2 2      SPM 66 86      Minutes 15 15      METs 1.8 2.4               Exercise Comments:   Exercise Comments     Row Name 03/22/23 1131           Exercise Comments Marlise tolerated low intensity exercise well without symptoms. Blood pressure mildly elevate pre and post exercise. Will continue to monitor.                Exercise Goals and Review:   Exercise Goals     Row Name  03/16/23 1312             Exercise Goals   Increase Physical Activity Yes       Intervention Provide advice, education, support and counseling about physical activity/exercise needs.;Develop an individualized exercise prescription for aerobic and resistive training based on initial evaluation findings, risk stratification, comorbidities and participant's personal goals.       Expected Outcomes Short Term: Attend rehab on a regular basis to increase amount of physical activity.;Long Term: Add in home exercise to make exercise part of routine and to increase amount of physical activity.;Long Term: Exercising regularly at least 3-5 days a week.       Increase Strength and Stamina Yes       Intervention Provide advice, education, support and counseling about physical activity/exercise needs.;Develop an individualized exercise prescription for aerobic and resistive training based on initial evaluation findings, risk stratification, comorbidities and participant's personal goals.       Expected Outcomes Short Term: Increase workloads from initial exercise prescription for resistance, speed, and METs.;Short Term: Perform resistance training exercises routinely during rehab and add in resistance training at home;Long Term: Improve cardiorespiratory fitness, muscular endurance and strength as measured by increased METs and functional capacity ( )       Able to understand and use rate of perceived exertion (RPE) scale Yes  Intervention Provide education and explanation on how to use RPE scale       Expected Outcomes Short Term: Able to use RPE daily in rehab to express subjective intensity level;Long Term:  Able to use RPE to guide intensity level when exercising independently       Knowledge and understanding of Target Heart Rate Range (THRR) Yes       Intervention Provide education and explanation of THRR including how the numbers were predicted and where they are located for reference        Expected Outcomes Short Term: Able to state/look up THRR;Long Term: Able to use THRR to govern intensity when exercising independently;Short Term: Able to use daily as guideline for intensity in rehab       Able to check pulse independently Yes       Intervention Provide education and demonstration on how to check pulse in carotid and radial arteries.;Review the importance of being able to check your own pulse for safety during independent exercise       Expected Outcomes Short Term: Able to explain why pulse checking is important during independent exercise;Long Term: Able to check pulse independently and accurately       Understanding of Exercise Prescription Yes       Intervention Provide education, explanation, and written materials on patient's individual exercise prescription       Expected Outcomes Short Term: Able to explain program exercise prescription;Long Term: Able to explain home exercise prescription to exercise independently                Exercise Goals Re-Evaluation :  Exercise Goals Re-Evaluation     Row Name 03/22/23 1131 04/06/23 1444           Exercise Goal Re-Evaluation   Exercise Goals Review Increase Physical Activity;Increase Strength and Stamina;Able to understand and use rate of perceived exertion (RPE) scale --      Comments Patient able to understand and use RPE scale appropriately. Anissa has been out since 03/24/23 due to GI issues. Unable to review goals at this time.      Expected Outcomes Progress workloads as tolerated to help increase strength and stamina. Review goals upon return to cardiac rehab.               Discharge Exercise Prescription (Final Exercise Prescription Changes):  Exercise Prescription Changes - 03/24/23 1036       Response to Exercise   Blood Pressure (Admit) 142/90    Blood Pressure (Exercise) 142/80    Blood Pressure (Exit) 128/76    Heart Rate (Admit) 96 bpm    Heart Rate (Exercise) 108 bpm    Heart Rate (Exit) 95  bpm    Rating of Perceived Exertion (Exercise) 8.5    Symptoms Anissa became nauseated after her 1st station and c/o around left rib, shoulder.    Comments Exercise discontinued after the first station. Onsite provider consulted.    Duration Progress to 30 minutes of  aerobic without signs/symptoms of physical distress    Intensity THRR unchanged      Progression   Progression Continue to progress workloads to maintain intensity without signs/symptoms of physical distress.    Average METs 2      Resistance Training   Training Prescription No   Exercised discontinued due to nausea and cramping.   Weight --    Reps --    Time --      Interval Training   Interval Training No  Recumbant Bike   Level --    RPM --    Watts --    Minutes --    METs --      NuStep   Level 2    SPM 86    Minutes 15    METs 2.4             Nutrition:  Target Goals: Understanding of nutrition guidelines, daily intake of sodium 1500mg , cholesterol 200mg , calories 30% from fat and 7% or less from saturated fats, daily to have 5 or more servings of fruits and vegetables.  Biometrics:  Pre Biometrics - 03/16/23 1309       Pre Biometrics   Waist Circumference 41.25 inches    Hip Circumference 43.25 inches    Waist to Hip Ratio 0.95 %    Triceps Skinfold 32 mm    % Body Fat 43 %    Grip Strength 22 kg    Flexibility 14.63 in    Single Leg Stand 30 seconds              Nutrition Therapy Plan and Nutrition Goals:  Nutrition Therapy & Goals - 03/22/23 1617       Nutrition Therapy   Diet Heart  Healthy Diet    Drug/Food Interactions Statins/Certain Fruits      Personal Nutrition Goals   Nutrition Goal Patient to identify strategies for reducing cardiovascular risk by attending the [employer] education and nutrition series weekly.    Personal Goal #2 Patient to improve diet quality by using the plate method as a guide for meal planning to include lean protein/plant protein,  fruits, vegetables, whole grains, nonfat dairy as part of a well-balanced diet.    Personal Goal #3 Patient to reduce sodium intake to <1500 per day    Comments Ameenah reports concerns of ongoing GI symptoms including diarhea/loose stools, nausea, vomiting, etc and decrease food intake; however, she does acknowledge that she is actually gaining weight and has not noticed any unwanted weight loss. She does report upcoming GI appointment in October. Her A1c and triglycerides remain elevated. She continues to work on smoking cessation but continues to smoke upto a pack a day. Patient will benefit from participation in intensive cardiac rehab for nutrition, exercise, and lifestyle modification.      Intervention Plan   Intervention Prescribe, educate and counsel regarding individualized specific dietary modifications aiming towards targeted core components such as weight, hypertension, lipid management, diabetes, heart failure and other comorbidities.;Nutrition handout(s) given to patient.    Expected Outcomes Short Term Goal: Understand basic principles of dietary content, such as calories, fat, sodium, cholesterol and nutrients.;Long Term Goal: Adherence to prescribed nutrition plan.             Nutrition Assessments:  Nutrition Assessments - 03/23/23 1144       Rate Your Plate Scores   Pre Score 54            MEDIFICTS Score Key: >=70 Need to make dietary changes  40-70 Heart Healthy Diet <= 40 Therapeutic Level Cholesterol Diet   Flowsheet Row INTENSIVE CARDIAC REHAB from 03/22/2023 in [facility]  Picture Your Plate Total Score on Admission 54      Picture Your Plate Scores: <16 Unhealthy dietary pattern with much room for improvement. 41-50 Dietary pattern unlikely to meet recommendations for good health and room for improvement. 51-60 More healthful dietary pattern, with some room for improvement.  >60  Healthy  dietary pattern, although there may be some specific behaviors that could be improved.    Nutrition Goals Re-Evaluation:  Nutrition Goals Re-Evaluation     Row Name 03/22/23 1617             Goals   Current Weight 180 lb 5.4 oz (81.8 kg)       Comment triglycerides 247, A1c 5.8       Expected Outcome Patient will benefit from participation in intensive cardiac rehab for nutrition, exercise, and lifestyle modification.                Nutrition Goals Re-Evaluation:  Nutrition Goals Re-Evaluation     Row Name 03/22/23 1617             Goals   Current Weight 180 lb 5.4 oz (81.8 kg)       Comment triglycerides 247, A1c 5.8       Expected Outcome Patient will benefit from participation in intensive cardiac rehab for nutrition, exercise, and lifestyle modification.                Nutrition Goals Discharge (Final Nutrition Goals Re-Evaluation):  Nutrition Goals Re-Evaluation - 03/22/23 1617       Goals   Current Weight 180 lb 5.4 oz (81.8 kg)    Comment triglycerides 247, A1c 5.8    Expected Outcome Patient will benefit from participation in intensive cardiac rehab for nutrition, exercise, and lifestyle modification.             Psychosocial: Target Goals: Acknowledge presence or absence of significant depression and/or stress, maximize coping skills, provide positive support system. Participant is able to verbalize types and ability to use techniques and skills needed for reducing stress and depression.  Initial Review & Psychosocial Screening:  Initial Psych Review & Screening - 03/16/23 1341       Initial Review   Current issues with History of Depression;Current Stress Concerns    Source of Stress Concerns Chronic Illness;Financial;Family    Comments Saunders Glance denies being depressed currently. Anissa is currently not working as she lost her job after having open heart surgery      Family Dynamics   Good Support System? Yes   Anissa has her Mom and  daughters for support     Barriers   Psychosocial barriers to participate in program The patient should benefit from training in stress management and relaxation.      Screening Interventions   Interventions Encouraged to exercise;Provide feedback about the scores to participant;To provide support and resources with identified psychosocial needs    Expected Outcomes Short Term goal: Utilizing psychosocial counselor, staff and physician to assist with identification of specific Stressors or current issues interfering with healing process. Setting desired goal for each stressor or current issue identified.;Long Term Goal: Stressors or current issues are controlled or eliminated.;Short Term goal: Identification and review with participant of any Quality of Life or Depression concerns found by scoring the questionnaire.;Long Term goal: The participant improves quality of Life and PHQ9 Scores as seen by post scores and/or verbalization of changes             Quality of Life Scores:  Quality of Life - 03/16/23 1347       Quality of Life   Select Quality of Life      Quality of Life Scores   Health/Function Pre 19.17 %    Socioeconomic Pre 19.38 %    Psych/Spiritual Pre 20.57 %  Family Pre 24.1 %    GLOBAL Pre 20.2 %            Scores of 19 and below usually indicate a poorer quality of life in these areas.  A difference of  2-3 points is a clinically meaningful difference.  A difference of 2-3 points in the total score of the Quality of Life Index has been associated with significant improvement in overall quality of life, self-image, physical symptoms, and general health in studies assessing change in quality of life.  PHQ-9: Review Flowsheet       03/16/2023  Depression screen PHQ 2/9  Decreased Interest 0  Down, Depressed, Hopeless 0  PHQ - 2 Score 0  Altered sleeping 3  Tired, decreased energy 3  Change in appetite 3  Feeling bad or failure about yourself  0  Trouble  concentrating 0  Moving slowly or fidgety/restless 0  Suicidal thoughts 0  PHQ-9 Score 9  Difficult doing work/chores Somewhat difficult    Details           Interpretation of Total Score  Total Score Depression Severity:  1-4 = Minimal depression, 5-9 = Mild depression, 10-14 = Moderate depression, 15-19 = Moderately severe depression, 20-27 = Severe depression   Psychosocial Evaluation and Intervention:   Psychosocial Re-Evaluation:  Psychosocial Re-Evaluation     Row Name 03/23/23 0815 04/06/23 0744           Psychosocial Re-Evaluation   Current issues with History of Depression;Current Stress Concerns History of Depression;Current Stress Concerns      Comments Anissa started cardiac rehab on 03/22/23. Anissa did not voice any increased concerns or stressors on her first day of exercise.  Will review PHQ2-9 and quality of life survey in the upcoming week Anissa last day of exercise at cardiac rehab was on 03/24/23  cardiac rehab   Will review PHQ2-9 and quality of life survey upon return as Saunders Glance is out due to GI issues.      Expected Outcomes Anissa will have controlled or decreased stressors upon completion of cardiac rehab Anissa will have controlled or decreased stressors upon completion of cardiac rehab      Interventions Encouraged to attend Cardiac Rehabilitation for the exercise;Stress management education;Relaxation education Encouraged to attend Cardiac Rehabilitation for the exercise;Stress management education;Relaxation education        Initial Review   Source of Stress Concerns Financial;Family Financial;Family      Comments Will continue to monitor and offer support as needed. Will continue to monitor and offer support as needed.               Psychosocial Discharge (Final Psychosocial Re-Evaluation):  Psychosocial Re-Evaluation - 04/06/23 0744       Psychosocial Re-Evaluation   Current issues with History of Depression;Current Stress Concerns     Comments Anissa last day of exercise at cardiac rehab was on 03/24/23  cardiac rehab   Will review PHQ2-9 and quality of life survey upon return as Saunders Glance is out due to GI issues.    Expected Outcomes Anissa will have controlled or decreased stressors upon completion of cardiac rehab    Interventions Encouraged to attend Cardiac Rehabilitation for the exercise;Stress management education;Relaxation education      Initial Review   Source of Stress Concerns Financial;Family    Comments Will continue to monitor and offer support as needed.             Vocational Rehabilitation: Provide vocational rehab assistance to qualifying candidates.  Vocational Rehab Evaluation & Intervention:  Vocational Rehab - 03/16/23 1347       Initial Vocational Rehab Evaluation & Intervention   Assessment shows need for Vocational Rehabilitation No   Anissa is not working currently denies the need for vocational rehab at this time            Education: Education Goals: Education classes will be provided on a weekly basis, covering required topics. Participant will state understanding/return demonstration of topics presented.    Education     Row Name 03/22/23 1300     Education   Cardiac Education Topics [employer]   Glass blower/designer Nutrition   Nutrition Workshop Label Reading   Instruction Review Code 1- Verbalizes Understanding   Class Start Time 1145   Class Stop Time 1234   Class Time Calculation (min) 49 min    Row Name 03/24/23 1400     Education   Cardiac Education Topics Pritikin   Customer service manager   Weekly Topic [employer] Desserts   Instruction Review Code 1- Verbalizes Understanding   Class Start Time 1145   Class Stop Time 1230   Class Time Calculation (min) 45 min            Core Videos: Exercise    Move It!  Clinical staff conducted group or individual video education  with verbal and written material and guidebook.  Patient learns the recommended Pritikin exercise program. Exercise with the goal of living a long, healthy life. Some of the health benefits of exercise include controlled diabetes, healthier blood pressure levels, improved cholesterol levels, improved heart and lung capacity, improved sleep, and better body composition. Everyone should speak with their doctor before starting or changing an exercise routine.  Biomechanical Limitations Clinical staff conducted group or individual video education with verbal and written material and guidebook.  Patient learns how biomechanical limitations can impact exercise and how we can mitigate and possibly overcome limitations to have an impactful and balanced exercise routine.  Body Composition Clinical staff conducted group or individual video education with verbal and written material and guidebook.  Patient learns that body composition (ratio of muscle mass to fat mass) is a key component to assessing overall fitness, rather than body weight alone. Increased fat mass, especially visceral belly fat, can put [place] at increased risk for metabolic syndrome, type 2 diabetes, heart disease, and even death. It is recommended to combine diet and exercise (cardiovascular and resistance training) to improve your body composition. Seek guidance from your physician and exercise physiologist before implementing an exercise routine.  Exercise Action Plan Clinical staff conducted group or individual video education with verbal and written material and guidebook.  Patient learns the recommended strategies to achieve and enjoy long-term exercise adherence, including variety, self-motivation, self-efficacy, and positive decision making. Benefits of exercise include fitness, good health, weight management, more energy, better sleep, less stress, and overall well-being.  Medical   Heart Disease Risk Reduction Clinical staff conducted  group or individual video education with verbal and written material and guidebook.  Patient learns our heart is our most vital organ as it circulates oxygen, nutrients, white blood cells, and hormones throughout the entire body, and carries waste away. Data supports a plant-based eating plan like the Pritikin Program for its effectiveness in slowing progression of and reversing heart disease. The video provides a number of recommendations to address  heart disease.   Metabolic Syndrome and Belly Fat  Clinical staff conducted group or individual video education with verbal and written material and guidebook.  Patient learns what metabolic syndrome is, how it leads to heart disease, and how one can reverse it and keep it from coming back. You have metabolic syndrome if you have 3 of the following 5 criteria: abdominal obesity, high blood pressure, high triglycerides, low HDL cholesterol, and high blood sugar.  Hypertension and Heart Disease Clinical staff conducted group or individual video education with verbal and written material and guidebook.  Patient learns that high blood pressure, or hypertension, is very common in the [place]. Hypertension is largely due to excessive salt intake, but other important risk factors include being overweight, physical inactivity, drinking too much alcohol, smoking, and not eating enough potassium from fruits and vegetables. High blood pressure is a leading risk factor for heart attack, stroke, congestive heart failure, dementia, kidney failure, and premature death. Long-term effects of excessive salt intake include stiffening of the arteries and thickening of heart muscle and organ damage. Recommendations include ways to reduce hypertension and the risk of heart disease.  Diseases of Our Time - Focusing on Diabetes Clinical staff conducted group or individual video education with verbal and written material and guidebook.  Patient learns why the best way to stop  diseases of our time is prevention, through food and other lifestyle changes. Medicine (such as prescription pills and surgeries) is often only a Band-Aid on the problem, not a long-term solution. Most common diseases of our time include obesity, type 2 diabetes, hypertension, heart disease, and cancer. The Pritikin Program is recommended and has been proven to help reduce, reverse, and/or prevent the damaging effects of metabolic syndrome.  Nutrition   Overview of the Pritikin Eating Plan  Clinical staff conducted group or individual video education with verbal and written material and guidebook.  Patient learns about the Pritikin Eating Plan for disease risk reduction. The Pritikin Eating Plan emphasizes a wide variety of unrefined, minimally-processed carbohydrates, like fruits, vegetables, whole grains, and legumes. Go, Caution, and Stop food choices are explained. Plant-based and lean animal proteins are emphasized. Rationale provided for low sodium intake for blood pressure control, low added sugars for blood sugar stabilization, and low added fats and oils for coronary artery disease risk reduction and weight management.  Calorie Density  Clinical staff conducted group or individual video education with verbal and written material and guidebook.  Patient learns about calorie density and how it impacts the Pritikin Eating Plan. Knowing the characteristics of the food you choose will help you decide whether those foods will lead to weight gain or weight loss, and whether you want to consume more or less of them. Weight loss is usually a side effect of the Pritikin Eating Plan because of its focus on low calorie-dense foods.  Label Reading  Clinical staff conducted group or individual video education with verbal and written material and guidebook.  Patient learns about the Pritikin recommended label reading guidelines and corresponding recommendations regarding calorie density, added sugars, sodium  content, and whole grains.  Dining Out - Part 1  Clinical staff conducted group or individual video education with verbal and written material and guidebook.  Patient learns that restaurant meals can be sabotaging because they can be so high in calories, fat, sodium, and/or sugar. Patient learns recommended strategies on how to positively address this and avoid unhealthy pitfalls.  Facts on Fats  Clinical staff conducted group or  individual video education with verbal and written material and guidebook.  Patient learns that lifestyle modifications can be just as effective, if not more so, as many medications for lowering your risk of heart disease. A Pritikin lifestyle can help to reduce your risk of inflammation and atherosclerosis (cholesterol build-up, or plaque, in the artery walls). Lifestyle interventions such as dietary choices and physical activity address the cause of atherosclerosis. A review of the types of fats and their impact on blood cholesterol levels, along with dietary recommendations to reduce fat intake is also included.  Nutrition Action Plan  Clinical staff conducted group or individual video education with verbal and written material and guidebook.  Patient learns how to incorporate Pritikin recommendations into their lifestyle. Recommendations include planning and keeping personal health goals in mind as an important part of their success.  Healthy Mind-Set    Healthy Minds, Bodies, Hearts  Clinical staff conducted group or individual video education with verbal and written material and guidebook.  Patient learns how to identify when they are stressed. Video will discuss the impact of that stress, as well as the many benefits of stress management. Patient will also be introduced to stress management techniques. The way we think, act, and feel has an impact on our hearts.  How Our Thoughts Can Heal Our Hearts  Clinical staff conducted group or individual video education  with verbal and written material and guidebook.  Patient learns that negative thoughts can cause depression and anxiety. This can result in negative lifestyle behavior and serious health problems. Cognitive behavioral therapy is an effective method to help control our thoughts in order to change and improve our emotional outlook.  Additional Videos:  Exercise    Improving Performance  Clinical staff conducted group or individual video education with verbal and written material and guidebook.  Patient learns to use a non-linear approach by alternating intensity levels and lengths of time spent exercising to help burn more calories and lose more body fat. Cardiovascular exercise helps improve heart health, metabolism, hormonal balance, blood sugar control, and recovery from fatigue. Resistance training improves strength, endurance, balance, coordination, reaction time, metabolism, and muscle mass. Flexibility exercise improves circulation, posture, and balance. Seek guidance from your physician and exercise physiologist before implementing an exercise routine and learn your capabilities and proper form for all exercise.  Introduction to Yoga  Clinical staff conducted group or individual video education with verbal and written material and guidebook.  Patient learns about yoga, a discipline of the coming together of mind, breath, and body. The benefits of yoga include improved flexibility, improved range of motion, better posture and core strength, increased lung function, weight loss, and positive self-image. Yoga's heart health benefits include lowered blood pressure, healthier heart rate, decreased cholesterol and triglyceride levels, improved immune function, and reduced stress. Seek guidance from your physician and exercise physiologist before implementing an exercise routine and learn your capabilities and proper form for all exercise.  Medical   Aging: Enhancing Your Quality of Life  Clinical  staff conducted group or individual video education with verbal and written material and guidebook.  Patient learns key strategies and recommendations to stay in good physical health and enhance quality of life, such as prevention strategies, having an advocate, securing a Health Care Proxy and Power of Attorney, and keeping a list of medications and system for tracking them. It also discusses how to avoid risk for bone loss.  Biology of Weight Control  Clinical staff conducted group or individual video education  with verbal and written material and guidebook.  Patient learns that weight gain occurs because we consume more calories than we burn (eating more, moving less). Even if your body weight is normal, you may have higher ratios of fat compared to muscle mass. Too much body fat puts you at increased risk for cardiovascular disease, heart attack, stroke, type 2 diabetes, and obesity-related cancers. In addition to exercise, following the Pritikin Eating Plan can help reduce your risk.  Decoding Lab Results  Clinical staff conducted group or individual video education with verbal and written material and guidebook.  Patient learns that lab test reflects one measurement whose values change over time and are influenced by many factors, including medication, stress, sleep, exercise, food, hydration, pre-existing medical conditions, and more. It is recommended to use the knowledge from this video to become more involved with your lab results and evaluate your numbers to speak with your doctor.   Diseases of Our Time - Overview  Clinical staff conducted group or individual video education with verbal and written material and guidebook.  Patient learns that according to the CDC, 50% to 70% of chronic diseases (such as obesity, type 2 diabetes, elevated lipids, hypertension, and heart disease) are avoidable through lifestyle improvements including healthier food choices, listening to satiety cues, and  increased physical activity.  Sleep Disorders Clinical staff conducted group or individual video education with verbal and written material and guidebook.  Patient learns how good quality and duration of sleep are important to overall health and well-being. Patient also learns about sleep disorders and how they impact health along with recommendations to address them, including discussing with a physician.  Nutrition  Dining Out - Part 2 Clinical staff conducted group or individual video education with verbal and written material and guidebook.  Patient learns how to plan ahead and communicate in order to maximize their dining experience in a healthy and nutritious manner. Included are recommended food choices based on the type of restaurant the patient is visiting.   Fueling a Banker conducted group or individual video education with verbal and written material and guidebook.  There is a strong connection between our food choices and our health. Diseases like obesity and type 2 diabetes are very prevalent and are in large-part due to lifestyle choices. The Pritikin Eating Plan provides plenty of food and hunger-curbing satisfaction. It is easy to follow, affordable, and helps reduce health risks.  Menu Workshop  Clinical staff conducted group or individual video education with verbal and written material and guidebook.  Patient learns that restaurant meals can sabotage health goals because they are often packed with calories, fat, sodium, and sugar. Recommendations include strategies to plan ahead and to communicate with the manager, chef, or server to help order a healthier meal.  Planning Your Eating Strategy  Clinical staff conducted group or individual video education with verbal and written material and guidebook.  Patient learns about the Pritikin Eating Plan and its benefit of reducing the risk of disease. The Pritikin Eating Plan does not focus on calories.  Instead, it emphasizes high-quality, nutrient-rich foods. By knowing the characteristics of the foods, we choose, we can determine their calorie density and make informed decisions.  Targeting Your Nutrition Priorities  Clinical staff conducted group or individual video education with verbal and written material and guidebook.  Patient learns that lifestyle habits have a tremendous impact on disease risk and progression. This video provides eating and physical activity recommendations based on your personal  health goals, such as reducing LDL cholesterol, losing weight, preventing or controlling type 2 diabetes, and reducing high blood pressure.  Vitamins and Minerals  Clinical staff conducted group or individual video education with verbal and written material and guidebook.  Patient learns different ways to obtain key vitamins and minerals, including through a recommended healthy diet. It is important to discuss all supplements you take with your doctor.   Healthy Mind-Set    Smoking Cessation  Clinical staff conducted group or individual video education with verbal and written material and guidebook.  Patient learns that cigarette smoking and tobacco addiction pose a serious health risk which affects millions of people. Stopping smoking will significantly reduce the risk of heart disease, lung disease, and many forms of cancer. Recommended strategies for quitting are covered, including working with your doctor to develop a successful plan.  Culinary   Becoming a Set designer conducted group or individual video education with verbal and written material and guidebook.  Patient learns that cooking at home can be healthy, cost-effective, quick, and puts them in control. Keys to cooking healthy recipes will include looking at your recipe, assessing your equipment needs, planning ahead, making it simple, choosing cost-effective seasonal ingredients, and limiting the use of added  fats, salts, and sugars.  Cooking - Breakfast and Snacks  Clinical staff conducted group or individual video education with verbal and written material and guidebook.  Patient learns how important breakfast is to satiety and nutrition through the entire day. Recommendations include key foods to eat during breakfast to help stabilize blood sugar levels and to prevent overeating at meals later in the day. Planning ahead is also a key component.  Cooking - Educational psychologist conducted group or individual video education with verbal and written material and guidebook.  Patient learns eating strategies to improve overall health, including an approach to cook more at home. Recommendations include thinking of animal protein as a side on your plate rather than center stage and focusing instead on lower calorie dense options like vegetables, fruits, whole grains, and plant-based proteins, such as beans. Making sauces in large quantities to freeze for later and leaving the skin on your vegetables are also recommended to maximize your experience.  Cooking - Healthy Salads and Dressing Clinical staff conducted group or individual video education with verbal and written material and guidebook.  Patient learns that vegetables, fruits, whole grains, and legumes are the foundations of the Pritikin Eating Plan. Recommendations include how to incorporate each of these in flavorful and healthy salads, and how to create homemade salad dressings. Proper handling of ingredients is also covered. Cooking - Soups and [employer] - Soups and Desserts Clinical staff conducted group or individual video education with verbal and written material and guidebook.  Patient learns that Pritikin soups and desserts make for easy, nutritious, and delicious snacks and meal components that are low in sodium, fat, sugar, and calorie density, while high in vitamins, minerals, and filling fiber. Recommendations include  simple and healthy ideas for soups and desserts.   Overview     The Pritikin Solution Program Overview Clinical staff conducted group or individual video education with verbal and written material and guidebook.  Patient learns that the results of the Pritikin Program have been documented in more than 100 articles published in peer-reviewed journals, and the benefits include reducing risk factors for (and, in some cases, even reversing) high cholesterol, high blood pressure, type 2 diabetes, obesity, and  more! An overview of the three key pillars of the Pritikin Program will be covered: eating well, doing regular exercise, and having a healthy mind-set.  WORKSHOPS  Exercise: Exercise Basics: Building Your Action Plan Clinical staff led group instruction and group discussion with PowerPoint presentation and patient guidebook. To enhance the learning environment the use of posters, models and videos may be added. At the conclusion of this workshop, patients will comprehend the difference between physical activity and exercise, as well as the benefits of incorporating both, into their routine. Patients will understand the FITT (Frequency, Intensity, Time, and Type) principle and how to use it to build an exercise action plan. In addition, safety concerns and other considerations for exercise and cardiac rehab will be addressed by the presenter. The purpose of this lesson is to promote a comprehensive and effective weekly exercise routine in order to improve patients' overall level of fitness.   Managing Heart Disease: Your Path to a Healthier Heart Clinical staff led group instruction and group discussion with PowerPoint presentation and patient guidebook. To enhance the learning environment the use of posters, models and videos may be added.At the conclusion of this workshop, patients will understand the anatomy and physiology of the heart. Additionally, they will understand how Pritikin's three  pillars impact the risk factors, the progression, and the management of heart disease.  The purpose of this lesson is to provide a high-level overview of the heart, heart disease, and how the Pritikin lifestyle positively impacts risk factors.  Exercise Biomechanics Clinical staff led group instruction and group discussion with PowerPoint presentation and patient guidebook. To enhance the learning environment the use of posters, models and videos may be added. Patients will learn how the structural parts of their bodies function and how these functions impact their daily activities, movement, and exercise. Patients will learn how to promote a neutral spine, learn how to manage pain, and identify ways to improve their physical movement in order to promote healthy living. The purpose of this lesson is to expose patients to common physical limitations that impact physical activity. Participants will learn practical ways to adapt and manage aches and pains, and to minimize their effect on regular exercise. Patients will learn how to maintain good posture while sitting, walking, and lifting.  Balance Training and Fall Prevention  Clinical staff led group instruction and group discussion with PowerPoint presentation and patient guidebook. To enhance the learning environment the use of posters, models and videos may be added. At the conclusion of this workshop, patients will understand the importance of their sensorimotor skills (vision, proprioception, and the vestibular system) in maintaining their ability to balance as they age. Patients will apply a variety of balancing exercises that are appropriate for their current level of function. Patients will understand the common causes for poor balance, possible solutions to these problems, and ways to modify their physical environment in order to minimize their fall risk. The purpose of this lesson is to teach patients about the importance of maintaining  balance as they age and ways to minimize their risk of falling.  WORKSHOPS   Nutrition:  Fueling a Ship broker led group instruction and group discussion with PowerPoint presentation and patient guidebook. To enhance the learning environment the use of posters, models and videos may be added. Patients will review the foundational principles of the Pritikin Eating Plan and understand what constitutes a serving size in each of the food groups. Patients will also learn Pritikin-friendly foods that are better choices  when away from home and review make-ahead meal and snack options. Calorie density will be reviewed and applied to three nutrition priorities: weight maintenance, weight loss, and weight gain. The purpose of this lesson is to reinforce (in a group setting) the key concepts around what patients are recommended to eat and how to apply these guidelines when away from home by planning and selecting Pritikin-friendly options. Patients will understand how calorie density may be adjusted for different weight management goals.  Mindful Eating  Clinical staff led group instruction and group discussion with PowerPoint presentation and patient guidebook. To enhance the learning environment the use of posters, models and videos may be added. Patients will briefly review the concepts of the Pritikin Eating Plan and the importance of low-calorie dense foods. The concept of mindful eating will be introduced as well as the importance of paying attention to internal hunger signals. Triggers for non-hunger eating and techniques for dealing with triggers will be explored. The purpose of this lesson is to provide patients with the opportunity to review the basic principles of the Pritikin Eating Plan, discuss the value of eating mindfully and how to measure internal cues of hunger and fullness using the Hunger Scale. Patients will also discuss reasons for non-hunger eating and learn strategies to use  for controlling emotional eating.  Targeting Your Nutrition Priorities Clinical staff led group instruction and group discussion with PowerPoint presentation and patient guidebook. To enhance the learning environment the use of posters, models and videos may be added. Patients will learn how to determine their genetic susceptibility to disease by reviewing their family history. Patients will gain insight into the importance of diet as part of an overall healthy lifestyle in mitigating the impact of genetics and other environmental insults. The purpose of this lesson is to provide patients with the opportunity to assess their personal nutrition priorities by looking at their family history, their own health history and current risk factors. Patients will also be able to discuss ways of prioritizing and modifying the [facility] Eating Plan for their highest risk areas  Menu  Clinical staff led group instruction and group discussion with PowerPoint presentation and patient guidebook. To enhance the learning environment the use of posters, models and videos may be added. Using menus brought in from [employer], or printed from [employer], patients will apply the [facility] dining out guidelines that were presented in the [employer] video. Patients will also be able to practice these guidelines in a variety of provided scenarios. The purpose of this lesson is to provide patients with the opportunity to practice hands-on learning of the [facility] Dining Out guidelines with actual menus and practice scenarios.  Label Reading Clinical staff led group instruction and group discussion with PowerPoint presentation and patient guidebook. To enhance the learning environment the use of posters, models and videos may be added. Patients will review and discuss the [facility]in label reading guidelines presented in [employer]'s Label Reading Educational series video. Using fool labels brought in from local  grocery stores and markets, patients will apply the label reading guidelines and determine if the packaged food meet the Pritikin guidelines. The purpose of this lesson is to provide patients with the opportunity to review, discuss, and practice hands-on learning of the [facility] Label Reading guidelines with actual packaged food labels. Cooking School  [employer]'s [employer] are designed to teach patients ways to prepare quick, simple, and affordable recipes at home. The importance of nutrition's role in chronic disease risk reduction  is reflected in its emphasis in the overall [facility] program. By learning how to prepare essential core P[facility]in Eating Plan recipes, patients will increase control over what they eat; be able to customize the flavor of foods without the use of added salt, sugar, or fat; and improve the quality of the food they consume. By learning a set of core recipes which are easily assembled, quickly prepared, and affordable, patients are more likely to prepare more healthy foods at home. These workshops focus on convenient breakfasts, simple entres, side dishes, and desserts which can be prepared with minimal effort and are consistent with nutrition recommendations for cardiovascular risk reduction. Cooking [employer] are taught by a Armed forces logistics/support/administrative officer (RD) who has been trained by the [employer]. The chef or RD has a clear understanding of the importance of minimizing - if not completely eliminating - added fat, sugar, and sodium in recipes. Throughout the series of Cooking School Workshop sessions, patients will learn about healthy ingredients and efficient methods of cooking to build confidence in their capability to prepare    Cooking School weekly topics:  Adding Flavor- Sodium-Free  Fast and Healthy Breakfasts  Powerhouse Plant-Based Proteins  Satisfying Salads and Dressings  Simple Sides and Sauces  International Cuisine-Spotlight on  the [employer] Zones  Delicious Desserts  Savory Soups  [employer] - Meals in a Astronomer Appetizers and Snacks  Comforting Weekend Breakfasts  One-Pot Wonders   Fast Evening Meals  Landscape architect Your Pritikin Plate  WORKSHOPS   Healthy Mindset (Psychosocial):  Focused Goals, Sustainable Changes Clinical staff led group instruction and group discussion with PowerPoint presentation and patient guidebook. To enhance the learning environment the use of posters, models and videos may be added. Patients will be able to apply effective goal setting strategies to establish at least one personal goal, and then take consistent, meaningful action toward that goal. They will learn to identify common barriers to achieving personal goals and develop strategies to overcome them. Patients will also gain an understanding of how our mind-set can impact our ability to achieve goals and the importance of cultivating a positive and growth-oriented mind-set. The purpose of this lesson is to provide patients with a deeper understanding of how to set and achieve personal goals, as well as the tools and strategies needed to overcome common obstacles which may arise along the way.  From Head to Heart: The Power of a Healthy Outlook  Clinical staff led group instruction and group discussion with PowerPoint presentation and patient guidebook. To enhance the learning environment the use of posters, models and videos may be added. Patients will be able to recognize and describe the impact of emotions and mood on physical health. They will discover the importance of self-care and explore self-care practices which may work for them. Patients will also learn how to utilize the 4 C's to cultivate a healthier outlook and better manage stress and challenges. The purpose of this lesson is to demonstrate to patients how a healthy outlook is an essential part of maintaining good health, especially as they  continue their cardiac rehab journey.  Healthy Sleep for a Healthy Heart Clinical staff led group instruction and group discussion with PowerPoint presentation and patient guidebook. To enhance the learning environment the use of posters, models and videos may be added. At the conclusion of this workshop, patients will be able to demonstrate knowledge of the importance of sleep to overall health, well-being, and quality of life.  They will understand the symptoms of, and treatments for, common sleep disorders. Patients will also be able to identify daytime and nighttime behaviors which impact sleep, and they will be able to apply these tools to help manage sleep-related challenges. The purpose of this lesson is to provide patients with a general overview of sleep and outline the importance of quality sleep. Patients will learn about a few of the most common sleep disorders. Patients will also be introduced to the concept of "sleep hygiene," and discover ways to self-manage certain sleeping problems through simple daily behavior changes. Finally, the workshop will motivate patients by clarifying the links between quality sleep and their goals of heart-healthy living.   Recognizing and Reducing Stress Clinical staff led group instruction and group discussion with PowerPoint presentation and patient guidebook. To enhance the learning environment the use of posters, models and videos may be added. At the conclusion of this workshop, patients will be able to understand the types of stress reactions, differentiate between acute and chronic stress, and recognize the impact that chronic stress has on their health. They will also be able to apply different coping mechanisms, such as reframing negative self-talk. Patients will have the opportunity to practice a variety of stress management techniques, such as deep abdominal breathing, progressive muscle relaxation, and/or guided imagery.  The purpose of this lesson is to  educate patients on the role of stress in their lives and to provide healthy techniques for coping with it.  Learning Barriers/Preferences:  Learning Barriers/Preferences - 03/16/23 1346       Learning Barriers/Preferences   Learning Barriers Sight   wears reading glasses, chronic back pain   Learning Preferences Skilled Demonstration             Education Topics:  Knowledge Questionnaire Score:  Knowledge Questionnaire Score - 03/16/23 1355       Knowledge Questionnaire Score   Pre Score 25/28             Core Components/Risk Factors/Patient Goals at Admission:  Personal Goals and Risk Factors at Admission - 03/16/23 1311       Core Components/Risk Factors/Patient Goals on Admission    Weight Management Yes;Obesity;Weight Loss    Intervention Weight Management: Develop a combined nutrition and exercise program designed to reach desired caloric intake, while maintaining appropriate intake of nutrient and fiber, sodium and fats, and appropriate energy expenditure required for the weight goal.;Weight Management/Obesity: Establish reasonable short term and long term weight goals.;Obesity: Provide education and appropriate resources to help participant work on and attain dietary goals.    Admit Weight 176 lb 5.9 oz (80 kg)    Expected Outcomes Short Term: Continue to assess and modify interventions until short term weight is achieved;Long Term: Adherence to nutrition and physical activity/exercise program aimed toward attainment of established weight goal;Weight Loss: Understanding of general recommendations for a balanced deficit meal plan, which promotes 1-2 lb weight loss per week and includes a negative energy balance of [phone] kcal/d    Tobacco Cessation Yes    Number of packs per day 1 pack per day    Intervention Assist the participant in steps to quit. Provide individualized education and counseling about committing to Tobacco Cessation, relapse prevention, and  pharmacological support that can be provided by physician.;Education officer, environmental, assist with locating and accessing local/national Quit Smoking programs, and support quit date choice.    Expected Outcomes Short Term: Will demonstrate readiness to quit, by selecting a quit date.;Long Term: Complete abstinence from  all tobacco products for at least 12 months from quit date.;Short Term: Will quit all tobacco product use, adhering to prevention of relapse plan.    Hypertension Yes    Intervention Provide education on lifestyle modifcations including regular physical activity/exercise, weight management, moderate sodium restriction and increased consumption of fresh fruit, vegetables, and low fat dairy, alcohol moderation, and smoking cessation.;Monitor prescription use compliance.    Expected Outcomes Short Term: Continued assessment and intervention until BP is < 140/66mm HG in hypertensive participants. < 130/67mm HG in hypertensive participants with diabetes, heart failure or chronic kidney disease.;Long Term: Maintenance of blood pressure at goal levels.    Lipids Yes    Intervention Provide education and support for participant on nutrition & aerobic/resistive exercise along with prescribed medications to achieve LDL 70mg , HDL >40mg .    Expected Outcomes Short Term: Participant states understanding of desired cholesterol values and is compliant with medications prescribed. Participant is following exercise prescription and nutrition guidelines.;Long Term: Cholesterol controlled with medications as prescribed, with individualized exercise RX and with personalized nutrition plan. Value goals: LDL < 70mg , HDL > 40 mg.    Stress Yes    Intervention Offer individual and/or small group education and counseling on adjustment to heart disease, stress management and health-related lifestyle change. Teach and support self-help strategies.;Refer participants experiencing significant psychosocial distress to  appropriate mental health specialists for further evaluation and treatment. When possible, include family members and significant others in education/counseling sessions.    Expected Outcomes Short Term: Participant demonstrates changes in health-related behavior, relaxation and other stress management skills, ability to obtain effective social support, and compliance with psychotropic medications if prescribed.;Long Term: Emotional wellbeing is indicated by absence of clinically significant psychosocial distress or social isolation.             Core Components/Risk Factors/Patient Goals Review:   Goals and Risk Factor Review     Row Name 03/23/23 0834 04/06/23 0746           Core Components/Risk Factors/Patient Goals Review   Personal Goals Review Weight Management/Obesity;Hypertension;Lipids;Stress;Tobacco Cessation Weight Management/Obesity;Hypertension;Lipids;Stress;Tobacco Cessation      Review Anissa started cardiac rehab on 03/22/23. Anissa did well with exercise. Systolic BP 130-154. Diastolic BP 72-92. will continue to encourage smoking cessation. Will monitor BP Anissa's last day of exercise at  cardiac rehab was on 03/24/23 . Anissa has been absent due to Gi Issues.      Expected Outcomes Annisa will continue to participate in cardiac rehab for exercise, nutrition and lifestyle modifications Annisa will continue to participate in cardiac rehab for exercise, nutrition and lifestyle modifications               Core Components/Risk Factors/Patient Goals at Discharge (Final Review):   Goals and Risk Factor Review - 04/06/23 0746       Core Components/Risk Factors/Patient Goals Review   Personal Goals Review Weight Management/Obesity;Hypertension;Lipids;Stress;Tobacco Cessation    Review Anissa's last day of exercise at  cardiac rehab was on 03/24/23 . Anissa has been absent due to Gi Issues.    Expected Outcomes Annisa will continue to participate in cardiac rehab for  exercise, nutrition and lifestyle modifications             ITP Comments:  ITP Comments     Row Name 03/16/23 1309 03/23/23 0812 04/05/23 1755       ITP Comments Medical Director- Dr. Armanda Magic, MD. Introduction to the [facility] Education Program / Intensive Cardiac Rehab. Reviewed orientation folder with patient.  30 Day ITP Review. Annisa started cardiac rehab on 03/23/23 and did well with exercise 30 Day ITP Review. Annisa's last day of exercise at cardiac rehab was on 03/24/23.  Anissa has been absent due to GI issues.              Comments: See ITP Comments

## 2023-04-07 ENCOUNTER — Encounter (HOSPITAL_COMMUNITY): Payer: BC Managed Care – PPO

## 2023-04-08 ENCOUNTER — Telehealth (HOSPITAL_COMMUNITY): Payer: Self-pay | Admitting: *Deleted

## 2023-04-08 NOTE — Telephone Encounter (Signed)
Left message to call cardiac rehab.Thayer Headings RN BSN

## 2023-04-09 ENCOUNTER — Encounter (HOSPITAL_COMMUNITY): Payer: BC Managed Care – PPO

## 2023-04-12 ENCOUNTER — Encounter (HOSPITAL_COMMUNITY): Payer: BC Managed Care – PPO

## 2023-04-14 ENCOUNTER — Encounter (HOSPITAL_COMMUNITY): Payer: BC Managed Care – PPO

## 2023-04-16 ENCOUNTER — Encounter (HOSPITAL_COMMUNITY): Payer: BC Managed Care – PPO

## 2023-04-16 ENCOUNTER — Telehealth (HOSPITAL_COMMUNITY): Payer: Self-pay

## 2023-04-16 NOTE — Telephone Encounter (Signed)
Attempted to call pt in regards to Cardiac rehab. LM on VM

## 2023-04-19 ENCOUNTER — Encounter (HOSPITAL_COMMUNITY): Payer: BC Managed Care – PPO

## 2023-04-19 NOTE — Addendum Note (Signed)
Encounter addended by: [place], Neleh Muldoon G, RD on: 04/19/2023 11:02 AM  Actions taken: Flowsheet data copied forward, Flowsheet accepted

## 2023-04-21 ENCOUNTER — Encounter (HOSPITAL_COMMUNITY): Payer: BC Managed Care – PPO

## 2023-04-23 ENCOUNTER — Encounter (HOSPITAL_COMMUNITY): Payer: BC Managed Care – PPO

## 2023-04-23 NOTE — Addendum Note (Signed)
Encounter addended by: Cammy Copa, RN on: 04/23/2023 11:03 AM  Actions taken: Pend clinical note

## 2023-04-26 ENCOUNTER — Encounter (HOSPITAL_COMMUNITY): Payer: BC Managed Care – PPO

## 2023-04-28 ENCOUNTER — Encounter (HOSPITAL_COMMUNITY): Payer: BC Managed Care – PPO

## 2023-04-30 ENCOUNTER — Encounter (HOSPITAL_COMMUNITY): Payer: BC Managed Care – PPO

## 2023-05-03 ENCOUNTER — Encounter (HOSPITAL_COMMUNITY): Payer: BC Managed Care – PPO

## 2023-05-05 ENCOUNTER — Other Ambulatory Visit: Payer: Self-pay | Admitting: Internal Medicine

## 2023-05-05 ENCOUNTER — Encounter (HOSPITAL_COMMUNITY): Payer: BC Managed Care – PPO

## 2023-05-05 DIAGNOSIS — R7989 Other specified abnormal findings of blood chemistry: Secondary | ICD-10-CM

## 2023-05-07 ENCOUNTER — Encounter (HOSPITAL_COMMUNITY): Payer: BC Managed Care – PPO

## 2023-05-10 ENCOUNTER — Encounter (HOSPITAL_COMMUNITY): Payer: BC Managed Care – PPO

## 2023-05-10 ENCOUNTER — Other Ambulatory Visit: Payer: Self-pay | Admitting: Surgery

## 2023-05-10 DIAGNOSIS — Z951 Presence of aortocoronary bypass graft: Secondary | ICD-10-CM

## 2023-05-12 ENCOUNTER — Encounter (HOSPITAL_COMMUNITY): Payer: BC Managed Care – PPO

## 2023-05-13 ENCOUNTER — Ambulatory Visit
Admission: RE | Admit: 2023-05-13 | Discharge: 2023-05-13 | Disposition: A | Discharge status: Home / Self Care | Payer: BC Managed Care – PPO | Source: Ambulatory Visit | Attending: Surgery | Admitting: Surgery

## 2023-05-13 DIAGNOSIS — Z951 Presence of aortocoronary bypass graft: Secondary | ICD-10-CM

## 2023-05-13 MED ORDER — IOPAMIDOL (ISOVUE-300) INJECTION 61%
75.0000 mL | Freq: Once | INTRAVENOUS | Status: AC | PRN
  Administered 2023-05-13: 75 mL via INTRAVENOUS

## 2023-05-14 ENCOUNTER — Encounter (HOSPITAL_COMMUNITY): Payer: BC Managed Care – PPO

## 2023-05-17 ENCOUNTER — Encounter (HOSPITAL_COMMUNITY): Payer: BC Managed Care – PPO

## 2023-05-18 ENCOUNTER — Ambulatory Visit (INDEPENDENT_AMBULATORY_CARE_PROVIDER_SITE_OTHER): Payer: BC Managed Care – PPO

## 2023-05-18 DIAGNOSIS — Z01810 Encounter for preprocedural cardiovascular examination: Secondary | ICD-10-CM

## 2023-05-18 NOTE — Telephone Encounter (Signed)
Pt called the office to ask if we could let the GI office know that she is waiting on CT results before she proceed any further. If CT is good then pt will call, our office back to reschedule the tele appt for pre op clearance. Per pt if CT is not ok she will need to d/w the surgeon who did her sternotomy .   I assured the pt that I will send notes to the GI office.

## 2023-05-18 NOTE — Telephone Encounter (Signed)
   Virtual Visit via Telephone Note   _____________   Date:  05/18/2023   Patient ID:  [name], DOB November 03, 1970, MRN [id number]  Patient requests rescheduling this visit. She is currently undergoing workup with Dr. Sharee Pimple office secondary to chest pain and a sensation that something is moving in her chest. There is a concern something is going on with her prior sternotomy. She plan to contact out office to reschedule virtual visit as soon as she has some answers regarding her chest discomfort.   Carlos Levering, NP  05/18/2023, 7:35 AM

## 2023-05-18 NOTE — Progress Notes (Signed)
   Virtual Visit via Telephone Note   _____________   Date:  05/18/2023   Patient ID:  [name], DOB November 03, 1970, MRN [id number]  Patient requests rescheduling this visit. She is currently undergoing workup with Dr. Sharee Pimple office secondary to chest pain and a sensation that something is moving in her chest. There is a concern something is going on with her prior sternotomy. She plan to contact out office to reschedule virtual visit as soon as she has some answers regarding her chest discomfort.   Carlos Levering, NP  05/18/2023, 7:35 AM

## 2023-05-19 ENCOUNTER — Encounter (HOSPITAL_COMMUNITY): Payer: BC Managed Care – PPO

## 2023-05-20 ENCOUNTER — Ambulatory Visit
Admission: RE | Admit: 2023-05-20 | Discharge: 2023-05-20 | Disposition: A | Discharge status: Home / Self Care | Payer: BC Managed Care – PPO | Source: Ambulatory Visit | Attending: Internal Medicine | Admitting: Internal Medicine

## 2023-05-20 DIAGNOSIS — R7989 Other specified abnormal findings of blood chemistry: Secondary | ICD-10-CM

## 2023-05-20 MED ORDER — GADOPICLENOL 0.5 MMOL/ML IV SOLN
7.5000 mL | Freq: Once | INTRAVENOUS | Status: AC | PRN
  Administered 2023-05-20: 7.5 mL via INTRAVENOUS

## 2023-05-21 ENCOUNTER — Encounter (HOSPITAL_COMMUNITY): Payer: BC Managed Care – PPO

## 2023-05-24 ENCOUNTER — Encounter (HOSPITAL_COMMUNITY): Payer: BC Managed Care – PPO

## 2023-05-26 ENCOUNTER — Telehealth: Payer: Self-pay | Admitting: Cardiovascular Disease

## 2023-05-26 ENCOUNTER — Encounter: Payer: Self-pay | Admitting: Surgery

## 2023-05-26 ENCOUNTER — Encounter (HOSPITAL_COMMUNITY): Payer: BC Managed Care – PPO

## 2023-05-26 ENCOUNTER — Ambulatory Visit (INDEPENDENT_AMBULATORY_CARE_PROVIDER_SITE_OTHER): Payer: BC Managed Care – PPO | Admitting: Surgery

## 2023-05-26 VITALS — BP 136/88 | HR 105 | Resp 18 | Ht 62.0 in | Wt 180.0 lb

## 2023-05-26 DIAGNOSIS — R0789 Other chest pain: Secondary | ICD-10-CM | POA: Diagnosis not present

## 2023-05-26 DIAGNOSIS — Z951 Presence of aortocoronary bypass graft: Secondary | ICD-10-CM

## 2023-05-26 NOTE — Telephone Encounter (Signed)
Patient is calling to r/s her pre op appt. Please advise.

## 2023-05-26 NOTE — Telephone Encounter (Signed)
Procedure is scheduled for 06/04/2023. She is not on anticoagulants or antiplatelets so she can reschedule but will need to be in time for proper documentation and clearance, and time to send to surgeon.

## 2023-05-26 NOTE — Telephone Encounter (Signed)
Will forward to pre op APP to review if the pt has been cleared.

## 2023-05-27 ENCOUNTER — Ambulatory Visit: Payer: BC Managed Care – PPO | Attending: Cardiology | Admitting: Nurse Practitioner

## 2023-05-27 DIAGNOSIS — Z01810 Encounter for preprocedural cardiovascular examination: Secondary | ICD-10-CM | POA: Diagnosis not present

## 2023-05-27 NOTE — Telephone Encounter (Addendum)
   Patient Name: [name]  DOB: 08/02/1970 MRN: [id number]  Primary Cardiologist: Verne Carrow, MD  Chart reviewed as part of pre-operative protocol coverage.  Patient was rescheduled for telephone visit for preoperative cardiac evaluation due to concerns for chest pain.  She was evaluated by CT surgery.  Her pain was felt to be musculoskeletal.  See clearance note in epic from 05/27/2023 for finalized clearance.   Joylene Grapes, NP 05/27/2023, 11:06 AM

## 2023-05-27 NOTE — Progress Notes (Signed)
Virtual Visit via Telephone Note   Because of [name]'s co-morbid illnesses, she is at least at moderate risk for complications without adequate follow up.  This format is felt to be most appropriate for this patient at this time.  The patient did not have access to video technology/had technical difficulties with video requiring transitioning to audio format only (telephone).  All issues noted in this document were discussed and addressed.  No physical exam could be performed with this format.  Please refer to the patient's chart for her consent to telehealth for [facility].  Evaluation Performed:  Preoperative cardiovascular risk assessment _____________   Date:  05/27/2023   Patient ID:  [name], DOB [birth year], MRN [id number] Patient Location:  Home Provider location:   Office  Primary Care Provider:  Charlott Rakes, MD Primary Cardiologist:  Verne Carrow, MD  Chief Complaint / Patient Profile   52 y.o. y/o female with a h/o CABG x 4 in 09/2022, ICM, possible atrial fibrillation not on anticoagulation, SVT, PVCs, and hyperlipidemia who is pending colonoscopy on 06/04/2023 with [facility] and presents today for telephonic preoperative cardiovascular risk assessment.  History of Present Illness    [name] is a 52 y.o. female who presents via audio/video conferencing for a telehealth visit today.  Pt was last seen in cardiology clinic on 02/25/2023 by Dr. Clifton James.  At that time [name] was doing well.  The patient is now pending procedure as outlined above. Since her last visit, she has been stable from a cardiac standpoint.  She was previously scheduled for a televisit for preoperative cardiac evaluation, however, this was delayed in the setting of chest pain that occurred after sneezing forcefully.  She was evaluate by CT surgery who did a CT of her chest that was unremarkable.  Her pain was felt to be  musculoskeletal.  Her symptoms have since improved. She denies any symptoms concerning for angina.  She denies chest pain, palpitations, dyspnea, pnd, orthopnea, n, v, dizziness, syncope, edema, weight gain, or early satiety. All other systems reviewed and are otherwise negative except as noted above.   Past Medical History    Past Medical History:  Diagnosis Date   A-fib (HCC)    Anxiety    Arthralgia    Asthma    Attention-deficit hyperactivity disorder, predominantly inattentive type    Back pain    Coronary artery disease    Dysrhythmia    Afib   Fear of flying    Fibromyalgia    denies having Fibromyalgia   Gastroesophageal reflux disease    HPV in female    Joint pain    Localized edema    Neck pain    OA (osteoarthritis)    Papillomavirus as the cause of diseases classified elsewhere    Primary insomnia    Pure hypercholesterolemia    Rheumatoid arthritis, seronegative, hand, unspecified laterality (HCC)    patient feels it is back related, not a true arthritis   Spondylolisthesis of lumbosacral region    Tobacco dependence    Unspecified inflammatory spondylopathy, cervical region (HCC)    Vitamin B12 deficiency anemia    Voice hoarseness    Past Surgical History:  Procedure Laterality Date   ABDOMINAL EXPOSURE N/A 06/28/2020   Procedure: ABDOMINAL EXPOSURE;  Surgeon: Cephus Shelling, MD;  Location: [facility] OR;  Service: Vascular;  Laterality: N/A;  anterior   ANTERIOR LUMBAR FUSION N/A 06/28/2020   Procedure: ANTERIOR LUMBAR INTERBODY  FUSION LUMBAR FIVE-SACRAL ONE.;  Surgeon: Tia Alert, MD;  Location: [facility] OR;  Service: Neurosurgery;  Laterality: N/A;  anterior approach   BIOPSY  01/28/2023   Procedure: BIOPSY;  Surgeon: Lynann Bologna, DO;  Location: [facility] ENDOSCOPY;  Service: Gastroenterology;;   BREAST ENHANCEMENT SURGERY     CARDIAC CATHETERIZATION     CARPAL TUNNEL RELEASE Left    CARPAL TUNNEL RELEASE Right 06/28/2020   Procedure: CARPAL TUNNEL  RELEASE;  Surgeon: Tia Alert, MD;  Location: [facility] OR;  Service: Neurosurgery;  Laterality: Right;   CERVICAL SPINE SURGERY     CHOLECYSTECTOMY     CLIPPING OF ATRIAL APPENDAGE N/A 10/08/2022   Procedure: CLIPPING OF ATRIAL APPENDAGE USING ATRICURE ATRICLIP PRO2 SIZE 35;  Surgeon: Alleen Borne, MD;  Location: [facility] OR;  Service: Open Heart Surgery;  Laterality: N/A;   CORONARY ARTERY BYPASS GRAFT N/A 10/08/2022   Procedure: CORONARY ARTERY BYPASS GRAFTING (CABG) X3 USING LEFT INTERNAL MAMMARY ARTERY AND RIGHT GREATER SAPHENOUS VEIN HARVESTED ENDOSCOPICALLY;  Surgeon: Alleen Borne, MD;  Location: [facility] OR;  Service: Open Heart Surgery;  Laterality: N/A;   CORONARY PRESSURE/FFR STUDY N/A 09/07/2022   Procedure: INTRAVASCULAR PRESSURE WIRE/FFR STUDY;  Surgeon: Yvonne Kendall, MD;  Location: [facility] INVASIVE [facility] LAB;  Service: Cardiovascular;  Laterality: N/A;   ESOPHAGOGASTRODUODENOSCOPY N/A 01/28/2023   Procedure: ESOPHAGOGASTRODUODENOSCOPY (EGD);  Surgeon: Lynann Bologna, DO;  Location: [facility];  Service: Gastroenterology;  Laterality: N/A;   FOOT SURGERY     MANDIBLE SURGERY     MAZE N/A 10/08/2022   Procedure: PULMONARY VEIN ABLATION USING ATRICURE ISOLATOR SYNERGY ENCOMPASS OLH;  Surgeon: Alleen Borne, MD;  Location: [facility] OR;  Service: Open Heart Surgery;  Laterality: N/A;   RIGHT/LEFT HEART CATH AND CORONARY ANGIOGRAPHY N/A 09/07/2022   Procedure: RIGHT/LEFT HEART CATH AND CORONARY ANGIOGRAPHY;  Surgeon: Yvonne Kendall, MD;  Location: [facility] INVAS[facility];  Service: Cardiovascular;  Laterality: N/A;   TEE WITHOUT CARDIOVERSION N/A 10/08/2022   Procedure: TRANSESOPHAGEAL ECHOCARDIOGRAM;  Surgeon: Alleen Borne, MD;  Location: [facility] OR;  Service: Open Heart Surgery;  Laterality: N/A;   TONSILLECTOMY     TUBAL LIGATION     with ablation   TYMPANOSTOMY TUBE PLACEMENT      Allergies  Allergies  Allergen Reactions   Zoloft [Sertraline] Other (See Comments)    Altered mental state     Home Medications    Prior to Admission medications   Medication Sig Start Date End Date Taking? Authorizing Provider  albuterol (VENTOLIN HFA) 108 (90 Base) MCG/ACT inhaler Inhale 2 puffs into the lungs every 4 (four) hours as needed for wheezing or shortness of breath. 10/15/22   Leary Roca, PA-C  amphetamine-dextroamphetamine (ADDERALL XR) 30 MG 24 hr capsule Take 60 mg by mouth daily.    [provider]  atorvastatin (LIPITOR) 80 MG tablet Take 1 tablet (80 mg total) by mouth daily. 09/09/22   Sharlene Dory, PA-C  cyanocobalamin (VITAMIN B12) 1000 MCG tablet Take 1,000 mcg by mouth daily.    [provider]  dexamethasone (DECADRON) 1 MG tablet SMARTSIG:1 Tablet(s) By Mouth 03/29/23   [provider]  dicyclomine (BENTYL) 20 MG tablet Take by mouth. 04/01/23   [provider]  escitalopram (LEXAPRO) 10 MG tablet Take 10 mg by mouth daily as needed (anxiety).    [provider]  ferrous sulfate 325 (65 FE) MG EC tablet Take 325 mg by mouth daily. Takes as needed  [provider]  furosemide (LASIX) 20 MG tablet Take 1 tablet (20 mg total) by mouth daily as needed (for lower extremity edema). 11/02/22   Sharlene Dory, PA-C  gabapentin (NEURONTIN) 100 MG capsule Take 100-200 mg by mouth 4 (four) times daily as needed (pain).    [provider]  gabapentin (NEURONTIN) 300 MG capsule Take 600 mg by mouth daily as needed (pain).    [provider]  metoprolol succinate (TOPROL-XL) 50 MG 24 hr tablet Take 1 tablet (50 mg total) by mouth 2 (two) times daily. Take with or immediately following a meal. 02/25/23   Kathleene Hazel, MD  nicotine (NICODERM CQ - DOSED IN MG/24 HOURS) 21 mg/24hr patch Place 1 patch (21 mg total) onto the skin daily. 01/28/23   Meredeth Ide, MD  nitroGLYCERIN (NITROSTAT) 0.4 MG SL tablet Place 1 tablet (0.4 mg total) under the tongue every 5 (five) minutes as needed for chest pain.  07/24/22 05/21/23  Sharlene Dory, PA-C  VITAMIN D, CHOLECALCIFEROL, PO Take 1 tablet by mouth daily.    [provider]  zolpidem (AMBIEN) 10 MG tablet Take 10 mg by mouth at bedtime.    [provider]    Physical Exam    Vital Signs:  [name] does not have vital signs available for review today.  Given telephonic nature of communication, physical exam is limited. AAOx3. NAD. Normal affect.  Speech and respirations are unlabored.  Accessory Clinical Findings    None  Assessment & Plan    1.  Preoperative Cardiovascular Risk Assessment:  According to the Revised Cardiac Risk Index (RCRI), her Perioperative Risk of Major Cardiac Event is (%): 0.9. Her Functional Capacity in METs is: 9.89 according to the Duke Activity Status Index (DASI). Therefore, based on ACC/AHA guidelines, patient would be at acceptable risk for the planned procedure without further cardiovascular testing.  The patient was advised that if she develops new symptoms prior to surgery to contact our office to arrange for a follow-up visit, and she verbalized understanding.  A copy of this note will be routed to requesting surgeon.  Time:   Today, I have spent 6 minutes with the patient with telehealth technology discussing medical history, symptoms, and management plan.     Joylene Grapes, NP  05/27/2023, 11:25 AM

## 2023-05-28 ENCOUNTER — Encounter (HOSPITAL_COMMUNITY): Payer: BC Managed Care – PPO

## 2023-05-30 NOTE — Progress Notes (Signed)
HPI: The patient is a 52 year old woman who underwent CABG x 3 with pulmonary vein isolation ablation and ligation of left atrial appendage on 10/08/2022.  I last saw her on 01/04/2023 and she was feeling much better and ready to return to work.  She returns today reporting that she recently started feeling some slight clicking in the lower portion of her sternum between the breasts which occurs with certain activities.  She was concerned that there may be a problem with her sternum.  She did have some pain in this area after a heavy coughing episode but that has markedly improved.  Current Outpatient Medications  Medication Sig Dispense Refill   albuterol (VENTOLIN HFA) 108 (90 Base) MCG/ACT inhaler Inhale 2 puffs into the lungs every 4 (four) hours as needed for wheezing or shortness of breath. 6.7 g 1   amphetamine-dextroamphetamine (ADDERALL XR) 30 MG 24 hr capsule Take 60 mg by mouth daily.     atorvastatin (LIPITOR) 80 MG tablet Take 1 tablet (80 mg total) by mouth daily. 90 tablet 3   cyanocobalamin (VITAMIN B12) 1000 MCG tablet Take 1,000 mcg by mouth daily.     dexamethasone (DECADRON) 1 MG tablet SMARTSIG:1 Tablet(s) By Mouth     dicyclomine (BENTYL) 20 MG tablet Take by mouth.     escitalopram (LEXAPRO) 10 MG tablet Take 10 mg by mouth daily as needed (anxiety).     ferrous sulfate 325 (65 FE) MG EC tablet Take 325 mg by mouth daily. Takes as needed     furosemide (LASIX) 20 MG tablet Take 1 tablet (20 mg total) by mouth daily as needed (for lower extremity edema). 45 tablet 3   gabapentin (NEURONTIN) 100 MG capsule Take 100-200 mg by mouth 4 (four) times daily as needed (pain).     gabapentin (NEURONTIN) 300 MG capsule Take 600 mg by mouth daily as needed (pain).     metoprolol succinate (TOPROL-XL) 50 MG 24 hr tablet Take 1 tablet (50 mg total) by mouth 2 (two) times daily. Take with or immediately following a meal. 180 tablet 3   nicotine (NICODERM CQ - DOSED IN MG/24 HOURS) 21  mg/24hr patch Place 1 patch (21 mg total) onto the skin daily. 28 patch 0   VITAMIN D, CHOLECALCIFEROL, PO Take 1 tablet by mouth daily.     zolpidem (AMBIEN) 10 MG tablet Take 10 mg by mouth at bedtime.     nitroGLYCERIN (NITROSTAT) 0.4 MG SL tablet Place 1 tablet (0.4 mg total) under the tongue every 5 (five) minutes as needed for chest pain. 25 tablet 3   Current Facility-Administered Medications  Medication Dose Route Frequency Provider Last Rate Last Admin   lactulose (CHRONULAC) 10 GM/15ML solution 20 g  20 g Oral Daily PRN Alleen Borne, MD         Physical Exam: BP 136/88 (BP Location: Left Arm, Patient Position: Sitting)   Pulse (!) 105   Resp 18   Ht 5\' 2"  (1.575 m)   Wt 180 lb (81.6 kg)   SpO2 97% Comment: RA  BMI 32.92 kg/m  She looks well. Cardiac exam shows regular rate and rhythm with normal heart sounds.  There is no murmur. Lungs are clear. The chest incision is well-healed and the sternum is stable.  There is no palpable instability of the sternum with breathing or coughing.  Diagnostic Tests:  Narrative & Impression  CLINICAL DATA:  Fracture, perceived sternal instability status post median sternotomy   EXAM: CT  CHEST WITH CONTRAST   TECHNIQUE: Multidetector CT imaging of the chest was performed during intravenous contrast administration.   RADIATION DOSE REDUCTION: This exam was performed according to the departmental dose-optimization program which includes automated exposure control, adjustment of the mA and/or kV according to patient size and/or use of iterative reconstruction technique.   CONTRAST:  75mL ISOVUE-300 IOPAMIDOL (ISOVUE-300) INJECTION 61%   COMPARISON:  01/26/2023   FINDINGS: Cardiovascular: Aortic atherosclerosis. Mild cardiomegaly. Left atrial appendage clip. Left coronary artery calcifications status post median sternotomy and CABG. No pericardial effusion.   Mediastinum/Nodes: No enlarged mediastinal, hilar, or axillary  lymph nodes. Thyroid gland, trachea, and esophagus demonstrate no significant findings.   Lungs/Pleura: Minimal paraseptal emphysema. Mild diffuse bilateral bronchial wall thickening. Very extensive fine centrilobular nodularity throughout the lungs. Mosaic attenuation of the lung bases. No pleural effusion or pneumothorax.   Upper Abdomen: No acute abnormality.   Musculoskeletal: Bilateral breast implants. No acute osseous findings. Status post median sternotomy, without bony incorporation of the sternal halves. No sternal wire fracture.   IMPRESSION: 1. Status post median sternotomy, without bony incorporation of the sternal halves. No sternal wire fracture. 2. Minimal emphysema and diffuse bilateral bronchial wall thickening. Very extensive fine centrilobular nodularity throughout the lungs, consistent with smoking-related respiratory bronchiolitis. 3. Mosaic attenuation of the lung bases, consistent with small airways disease. 4. Mild cardiomegaly and coronary artery disease status post median sternotomy and CABG.   Aortic Atherosclerosis (ICD10-I70.0) and Emphysema (ICD10-J43.9).     Electronically Signed   By: Jearld Lesch M.D.   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  There is minimal separation.  The 2 sternal halves look aligned.  I do not think there is anything to do about this.  It is possible that she may develop further bony incorporation over time but it could also remain a chronic fibrous nonunion.  This does not necessarily cause any problem but she could have some discomfort with heavy activities.  Removal of the sternal wires at some point sometimes helps.  At this time I would recommend continued observation.  I reviewed the CT images with her and answered all of her questions.  Plan:  She will return to see me if she develops any further symptoms with her  chest incision or increasing sternal instability.  I spent 10 minutes performing this established patient evaluation and > 50% of this time was spent face to face counseling and coordinating the care of this patient's sternal discomfort.   Alleen Borne, MD [facility] [phone]

## 2023-05-31 ENCOUNTER — Other Ambulatory Visit: Payer: BC Managed Care – PPO

## 2023-05-31 ENCOUNTER — Encounter (HOSPITAL_COMMUNITY): Payer: BC Managed Care – PPO

## 2023-06-02 ENCOUNTER — Encounter (HOSPITAL_COMMUNITY): Payer: BC Managed Care – PPO

## 2023-06-04 ENCOUNTER — Encounter (HOSPITAL_COMMUNITY): Payer: BC Managed Care – PPO

## 2023-06-07 ENCOUNTER — Encounter (HOSPITAL_COMMUNITY): Payer: BC Managed Care – PPO

## 2023-06-09 ENCOUNTER — Ambulatory Visit: Payer: BC Managed Care – PPO | Admitting: Surgery

## 2023-06-09 ENCOUNTER — Encounter (HOSPITAL_COMMUNITY): Payer: BC Managed Care – PPO

## 2023-06-11 ENCOUNTER — Encounter (HOSPITAL_COMMUNITY): Payer: BC Managed Care – PPO

## 2023-06-14 ENCOUNTER — Encounter (HOSPITAL_COMMUNITY): Payer: BC Managed Care – PPO

## 2023-06-16 ENCOUNTER — Encounter (HOSPITAL_COMMUNITY): Payer: BC Managed Care – PPO

## 2023-06-28 ENCOUNTER — Other Ambulatory Visit (HOSPITAL_BASED_OUTPATIENT_CLINIC_OR_DEPARTMENT_OTHER): Payer: Self-pay

## 2023-09-04 ENCOUNTER — Other Ambulatory Visit: Payer: Self-pay | Admitting: Physician Assistant

## 2023-09-06 MED ORDER — ATORVASTATIN CALCIUM 80 MG PO TABS: 80.0000 mg | ORAL_TABLET | Freq: Every day | ORAL | 1 refills | Status: AC

## 2023-10-05 ENCOUNTER — Other Ambulatory Visit: Payer: Self-pay | Admitting: Physician Assistant

## 2023-10-05 ENCOUNTER — Telehealth: Payer: Self-pay | Admitting: Cardiovascular Disease

## 2023-10-05 NOTE — Telephone Encounter (Signed)
 Pt is requesting a callback regarding he wanting to speak about her most recent visit at [facility] and being admitted. She'd like her summary notes looked over as well. She stated they made adjustments to her meds as well and she'd like approval before doing anything. Please advise

## 2023-10-05 NOTE — Telephone Encounter (Signed)
 Spoke with patient and she would like for you to review her labs and notes from the provider.

## 2023-10-06 ENCOUNTER — Other Ambulatory Visit: Payer: Self-pay | Admitting: Cardiovascular Disease

## 2023-10-06 NOTE — Telephone Encounter (Signed)
 Kathleene Hazel, MD  Lendon Ka, RN Caller: Unspecified (Yesterday, 10:50 AM) It looks like they added aldactone. I don't see anything they stopped. I reviewed the labs and notes. Elevated troponin in setting of acute on chronic GI illness/diarrhea/nausea. Troponin felt to be due to demand ischemia. LVEF normal by echo. Can we let her know that it is ok to start the aldactone and make sure I am not missing any other med changes? Thanks, chris ______________________________________________________  Jeanene Erb patient and reviewed message from Dr. Clifton James.  There were no other changes made.  Discussed the troponin and that it was thought to be demand ischemia.  Elevated trop was her main concern with the call.  She started the aldactone 2 days ago and only has a 30 day supply.  She is due for appointment per last ov note and recall for Feb.  I scheduled her with APP on 10/19/23.  Pt grateful for assistance.

## 2023-10-09 ENCOUNTER — Encounter (HOSPITAL_COMMUNITY)
Admission: EM | Disposition: A | Discharge status: Home / Self Care | Payer: Self-pay | Source: Home / Self Care | Attending: Cardiology

## 2023-10-09 ENCOUNTER — Emergency Department (HOSPITAL_COMMUNITY): Admit: 2023-10-09 | Payer: Self-pay | Admitting: Internal Medicine

## 2023-10-09 ENCOUNTER — Encounter (HOSPITAL_COMMUNITY): Payer: Self-pay

## 2023-10-09 ENCOUNTER — Inpatient Hospital Stay (HOSPITAL_COMMUNITY)
Admission: EM | Admit: 2023-10-09 | Discharge: 2023-10-11 | DRG: 322 | Disposition: A | Discharge status: Home / Self Care | Payer: Self-pay | Attending: Cardiology | Admitting: Cardiology

## 2023-10-09 DIAGNOSIS — F17200 Nicotine dependence, unspecified, uncomplicated: Secondary | ICD-10-CM | POA: Diagnosis not present

## 2023-10-09 DIAGNOSIS — Z635 Disruption of family by separation and divorce: Secondary | ICD-10-CM

## 2023-10-09 DIAGNOSIS — Z79899 Other long term (current) drug therapy: Secondary | ICD-10-CM | POA: Diagnosis not present

## 2023-10-09 DIAGNOSIS — I255 Ischemic cardiomyopathy: Secondary | ICD-10-CM | POA: Diagnosis present

## 2023-10-09 DIAGNOSIS — F40243 Fear of flying: Secondary | ICD-10-CM | POA: Diagnosis present

## 2023-10-09 DIAGNOSIS — I2510 Atherosclerotic heart disease of native coronary artery without angina pectoris: Secondary | ICD-10-CM | POA: Diagnosis present

## 2023-10-09 DIAGNOSIS — I471 Supraventricular tachycardia, unspecified: Secondary | ICD-10-CM | POA: Diagnosis present

## 2023-10-09 DIAGNOSIS — M069 Rheumatoid arthritis, unspecified: Secondary | ICD-10-CM | POA: Diagnosis present

## 2023-10-09 DIAGNOSIS — F17210 Nicotine dependence, cigarettes, uncomplicated: Secondary | ICD-10-CM | POA: Diagnosis present

## 2023-10-09 DIAGNOSIS — K219 Gastro-esophageal reflux disease without esophagitis: Secondary | ICD-10-CM | POA: Diagnosis present

## 2023-10-09 DIAGNOSIS — I2111 ST elevation (STEMI) myocardial infarction involving right coronary artery: Secondary | ICD-10-CM | POA: Diagnosis not present

## 2023-10-09 DIAGNOSIS — Z720 Tobacco use: Secondary | ICD-10-CM | POA: Insufficient documentation

## 2023-10-09 DIAGNOSIS — R519 Headache, unspecified: Secondary | ICD-10-CM | POA: Diagnosis present

## 2023-10-09 DIAGNOSIS — I493 Ventricular premature depolarization: Secondary | ICD-10-CM | POA: Diagnosis present

## 2023-10-09 DIAGNOSIS — I110 Hypertensive heart disease with heart failure: Secondary | ICD-10-CM | POA: Diagnosis present

## 2023-10-09 DIAGNOSIS — F900 Attention-deficit hyperactivity disorder, predominantly inattentive type: Secondary | ICD-10-CM | POA: Diagnosis present

## 2023-10-09 DIAGNOSIS — M797 Fibromyalgia: Secondary | ICD-10-CM | POA: Diagnosis present

## 2023-10-09 DIAGNOSIS — I214 Non-ST elevation (NSTEMI) myocardial infarction: Secondary | ICD-10-CM | POA: Diagnosis not present

## 2023-10-09 DIAGNOSIS — I219 Acute myocardial infarction, unspecified: Secondary | ICD-10-CM | POA: Diagnosis not present

## 2023-10-09 DIAGNOSIS — I252 Old myocardial infarction: Secondary | ICD-10-CM

## 2023-10-09 DIAGNOSIS — Z560 Unemployment, unspecified: Secondary | ICD-10-CM | POA: Diagnosis not present

## 2023-10-09 DIAGNOSIS — E7800 Pure hypercholesterolemia, unspecified: Secondary | ICD-10-CM | POA: Diagnosis present

## 2023-10-09 DIAGNOSIS — Z951 Presence of aortocoronary bypass graft: Secondary | ICD-10-CM | POA: Diagnosis not present

## 2023-10-09 DIAGNOSIS — F411 Generalized anxiety disorder: Secondary | ICD-10-CM | POA: Diagnosis present

## 2023-10-09 DIAGNOSIS — E785 Hyperlipidemia, unspecified: Secondary | ICD-10-CM | POA: Insufficient documentation

## 2023-10-09 DIAGNOSIS — I4891 Unspecified atrial fibrillation: Secondary | ICD-10-CM | POA: Diagnosis present

## 2023-10-09 DIAGNOSIS — I5020 Unspecified systolic (congestive) heart failure: Secondary | ICD-10-CM | POA: Insufficient documentation

## 2023-10-09 DIAGNOSIS — I2119 ST elevation (STEMI) myocardial infarction involving other coronary artery of inferior wall: Secondary | ICD-10-CM | POA: Diagnosis present

## 2023-10-09 DIAGNOSIS — Z955 Presence of coronary angioplasty implant and graft: Secondary | ICD-10-CM

## 2023-10-09 DIAGNOSIS — I25110 Atherosclerotic heart disease of native coronary artery with unstable angina pectoris: Secondary | ICD-10-CM | POA: Diagnosis not present

## 2023-10-09 DIAGNOSIS — R1310 Dysphagia, unspecified: Secondary | ICD-10-CM | POA: Diagnosis present

## 2023-10-09 DIAGNOSIS — Z8249 Family history of ischemic heart disease and other diseases of the circulatory system: Secondary | ICD-10-CM

## 2023-10-09 DIAGNOSIS — M4317 Spondylolisthesis, lumbosacral region: Secondary | ICD-10-CM | POA: Diagnosis present

## 2023-10-09 DIAGNOSIS — J45909 Unspecified asthma, uncomplicated: Secondary | ICD-10-CM | POA: Diagnosis present

## 2023-10-09 DIAGNOSIS — I5022 Chronic systolic (congestive) heart failure: Secondary | ICD-10-CM | POA: Diagnosis present

## 2023-10-09 DIAGNOSIS — Z888 Allergy status to other drugs, medicaments and biological substances status: Secondary | ICD-10-CM

## 2023-10-09 DIAGNOSIS — Z7902 Long term (current) use of antithrombotics/antiplatelets: Secondary | ICD-10-CM

## 2023-10-09 DIAGNOSIS — Z9851 Tubal ligation status: Secondary | ICD-10-CM

## 2023-10-09 DIAGNOSIS — I213 ST elevation (STEMI) myocardial infarction of unspecified site: Secondary | ICD-10-CM | POA: Diagnosis present

## 2023-10-09 HISTORY — PX: CORONARY STENT INTERVENTION: CATH118234

## 2023-10-09 HISTORY — PX: LEFT HEART CATH AND CORONARY ANGIOGRAPHY: CATH118249

## 2023-10-09 HISTORY — PX: CORONARY/GRAFT ACUTE MI REVASCULARIZATION: CATH118305

## 2023-10-09 LAB — CBC WITH DIFFERENTIAL/PLATELET
Abs Immature Granulocytes: 0.03 10*3/uL (ref 0.00–0.07)
Basophils Absolute: 0.1 10*3/uL (ref 0.0–0.1)
Basophils Relative: 1 %
Eosinophils Absolute: 0.2 10*3/uL (ref 0.0–0.5)
Eosinophils Relative: 2 %
HCT: 47.9 % — ABNORMAL HIGH (ref 36.0–46.0)
Hemoglobin: 15.4 g/dL — ABNORMAL HIGH (ref 12.0–15.0)
Immature Granulocytes: 0 %
Lymphocytes Relative: 27 %
Lymphs Abs: 2.5 10*3/uL (ref 0.7–4.0)
MCH: 31.6 pg (ref 26.0–34.0)
MCHC: 32.2 g/dL (ref 30.0–36.0)
MCV: 98.2 fL (ref 80.0–100.0)
Monocytes Absolute: 0.5 10*3/uL (ref 0.1–1.0)
Monocytes Relative: 5 %
Neutro Abs: 5.9 10*3/uL (ref 1.7–7.7)
Neutrophils Relative %: 65 %
Platelets: 348 10*3/uL (ref 150–400)
RBC: 4.88 MIL/uL (ref 3.87–5.11)
RDW: 13.2 % (ref 11.5–15.5)
WBC: 9.1 10*3/uL (ref 4.0–10.5)
nRBC: 0 % (ref 0.0–0.2)

## 2023-10-09 LAB — CG4 I-STAT (LACTIC ACID)
Lactic Acid, Venous: 3.6 mmol/L (ref 0.5–1.9)
Lactic Acid, Venous: 2.4 mmol/L (ref 0.5–1.9)

## 2023-10-09 LAB — COMPREHENSIVE METABOLIC PANEL WITH GFR
ALT: 23 U/L (ref 0–44)
AST: 23 U/L (ref 15–41)
Albumin: 3.4 g/dL — ABNORMAL LOW (ref 3.5–5.0)
Alkaline Phosphatase: 66 U/L (ref 38–126)
Anion gap: 11 (ref 5–15)
BUN: 10 mg/dL (ref 6–20)
CO2: 19 mmol/L — ABNORMAL LOW (ref 22–32)
Calcium: 9.4 mg/dL (ref 8.9–10.3)
Chloride: 104 mmol/L (ref 98–111)
Creatinine, Ser: 0.74 mg/dL (ref 0.44–1.00)
GFR, Estimated: >60 mL/min (ref >60)
Glucose, Bld: 136 mg/dL — ABNORMAL HIGH (ref 70–99)
Potassium: 3.6 mmol/L (ref 3.5–5.1)
Sodium: 134 mmol/L — ABNORMAL LOW (ref 135–145)
Total Bilirubin: 0.5 mg/dL (ref 0.0–1.2)
Total Protein: 5.8 g/dL — ABNORMAL LOW (ref 6.5–8.1)

## 2023-10-09 LAB — TROPONIN I (HIGH SENSITIVITY)
Troponin I (High Sensitivity): 526 ng/L (ref <18)
Troponin I (High Sensitivity): 3058 ng/L (ref <18)

## 2023-10-09 LAB — HCG, SERUM, QUALITATIVE: Preg, Serum: NEGATIVE

## 2023-10-09 LAB — LIPID PANEL
Cholesterol: 167 mg/dL (ref 0–200)
HDL: 40 mg/dL — ABNORMAL LOW (ref >40)
LDL Cholesterol: 96 mg/dL (ref 0–99)
Total CHOL/HDL Ratio: 4.2 ratio
Triglycerides: 154 mg/dL — ABNORMAL HIGH (ref <150)
VLDL: 31 mg/dL (ref 0–40)

## 2023-10-09 LAB — PROTIME-INR
INR: 1 (ref 0.8–1.2)
Prothrombin Time: 13.4 s (ref 11.4–15.2)

## 2023-10-09 LAB — POCT ACTIVATED CLOTTING TIME
Activated Clotting Time: 256 s
Activated Clotting Time: 268 s

## 2023-10-09 LAB — LACTIC ACID, PLASMA: Lactic Acid, Venous: 1.5 mmol/L (ref 0.5–1.9)

## 2023-10-09 LAB — APTT: aPTT: 29 s (ref 24–36)

## 2023-10-09 SURGERY — CORONARY/GRAFT ACUTE MI REVASCULARIZATION
Anesthesia: LOCAL

## 2023-10-09 MED ORDER — HEPARIN (PORCINE) IN NACL 2-0.9 UNITS/ML
INTRAMUSCULAR | Status: DC | PRN
  Administered 2023-10-09: 10 mL via INTRA_ARTERIAL

## 2023-10-09 MED ORDER — ASPIRIN 81 MG PO CHEW: 81.0000 mg | CHEWABLE_TABLET | Freq: Every day | ORAL | Status: DC

## 2023-10-09 MED ORDER — HYDRALAZINE HCL 20 MG/ML IJ SOLN: 10.0000 mg | INTRAMUSCULAR | Status: AC | PRN

## 2023-10-09 MED ORDER — ALBUTEROL SULFATE (2.5 MG/3ML) 0.083% IN NEBU
2.5000 mg | INHALATION_SOLUTION | RESPIRATORY_TRACT | Status: DC | PRN
Start: 2023-10-09 — End: 2023-10-11
  Administered 2023-10-11: 2.5 mg via RESPIRATORY_TRACT
  Filled 2023-10-09: qty 3

## 2023-10-09 MED ORDER — LABETALOL HCL 5 MG/ML IV SOLN: 10.0000 mg | INTRAVENOUS | Status: AC | PRN

## 2023-10-09 MED ORDER — IOHEXOL 350 MG/ML SOLN
INTRAVENOUS | Status: DC | PRN
Start: 2023-10-09 — End: 2023-10-09
  Administered 2023-10-09: 145 mL

## 2023-10-09 MED ORDER — ASPIRIN 81 MG PO CHEW
324.0000 mg | CHEWABLE_TABLET | ORAL | Status: AC
  Administered 2023-10-09: 324 mg via ORAL
  Filled 2023-10-09: qty 4

## 2023-10-09 MED ORDER — HEPARIN SODIUM (PORCINE) 1000 UNIT/ML IJ SOLN
INTRAMUSCULAR | Status: DC | PRN
  Administered 2023-10-09: 1000 [IU] via INTRA_ARTERIAL
  Administered 2023-10-09: 2000 [IU] via INTRA_ARTERIAL
  Administered 2023-10-09: 5000 [IU] via INTRA_ARTERIAL

## 2023-10-09 MED ORDER — ATORVASTATIN CALCIUM 80 MG PO TABS
80.0000 mg | ORAL_TABLET | Freq: Every day | ORAL | Status: DC
  Administered 2023-10-09 – 2023-10-11 (×3): 80 mg via ORAL
  Filled 2023-10-09 (×3): qty 1

## 2023-10-09 MED ORDER — EZETIMIBE 10 MG PO TABS
10.0000 mg | ORAL_TABLET | Freq: Every day | ORAL | Status: DC
  Administered 2023-10-09 – 2023-10-11 (×3): 10 mg via ORAL
  Filled 2023-10-09 (×3): qty 1

## 2023-10-09 MED ORDER — GABAPENTIN 100 MG PO CAPS
100.0000 mg | ORAL_CAPSULE | Freq: Four times a day (QID) | ORAL | Status: DC | PRN
  Administered 2023-10-09 – 2023-10-10 (×3): 200 mg via ORAL
  Administered 2023-10-10: 100 mg via ORAL
  Administered 2023-10-11 (×2): 200 mg via ORAL
  Filled 2023-10-09: qty 1
  Filled 2023-10-09 (×2): qty 2
  Filled 2023-10-09: qty 1
  Filled 2023-10-09 (×3): qty 2

## 2023-10-09 MED ORDER — MIDAZOLAM HCL 2 MG/2ML IJ SOLN
INTRAMUSCULAR | Status: AC
  Filled 2023-10-09: qty 2

## 2023-10-09 MED ORDER — ATORVASTATIN CALCIUM 80 MG PO TABS: 80.0000 mg | ORAL_TABLET | Freq: Every day | ORAL | Status: DC

## 2023-10-09 MED ORDER — ASPIRIN 81 MG PO CHEW: 324.0000 mg | CHEWABLE_TABLET | Freq: Once | ORAL | Status: DC

## 2023-10-09 MED ORDER — HEPARIN SODIUM (PORCINE) 5000 UNIT/ML IJ SOLN
4000.0000 [IU] | Freq: Once | INTRAMUSCULAR | Status: AC
Start: 2023-10-09 — End: 2023-10-09
  Administered 2023-10-09: 4000 [IU] via INTRAVENOUS

## 2023-10-09 MED ORDER — CHLORHEXIDINE GLUCONATE CLOTH 2 % EX PADS
6.0000 | MEDICATED_PAD | Freq: Every day | CUTANEOUS | Status: DC
  Administered 2023-10-09: 6 via TOPICAL

## 2023-10-09 MED ORDER — NICOTINE 21 MG/24HR TD PT24
21.0000 mg | MEDICATED_PATCH | Freq: Every day | TRANSDERMAL | Status: DC
  Administered 2023-10-09 – 2023-10-11 (×3): 21 mg via TRANSDERMAL
  Filled 2023-10-09 (×4): qty 1

## 2023-10-09 MED ORDER — VITAMIN B-12 1000 MCG PO TABS
1000.0000 ug | ORAL_TABLET | Freq: Every day | ORAL | Status: DC
  Administered 2023-10-09 – 2023-10-11 (×3): 1000 ug via ORAL
  Filled 2023-10-09 (×3): qty 1

## 2023-10-09 MED ORDER — ACETAMINOPHEN 325 MG PO TABS
650.0000 mg | ORAL_TABLET | ORAL | Status: DC | PRN
  Administered 2023-10-09 – 2023-10-11 (×7): 650 mg via ORAL
  Filled 2023-10-09 (×7): qty 2

## 2023-10-09 MED ORDER — SODIUM CHLORIDE 0.9 % IV SOLN
INTRAVENOUS | Status: DC | PRN
  Administered 2023-10-09: 10 mL/h via INTRAVENOUS

## 2023-10-09 MED ORDER — NITROGLYCERIN 0.4 MG SL SUBL: 0.4000 mg | SUBLINGUAL_TABLET | SUBLINGUAL | Status: DC | PRN

## 2023-10-09 MED ORDER — ORAL CARE MOUTH RINSE: 15.0000 mL | OROMUCOSAL | Status: DC | PRN

## 2023-10-09 MED ORDER — AMPHETAMINE-DEXTROAMPHET ER 10 MG PO CP24
60.0000 mg | ORAL_CAPSULE | Freq: Every day | ORAL | Status: DC
  Administered 2023-10-10: 60 mg via ORAL
  Filled 2023-10-09 (×2): qty 6

## 2023-10-09 MED ORDER — FENTANYL CITRATE (PF) 100 MCG/2ML IJ SOLN
INTRAMUSCULAR | Status: AC
  Filled 2023-10-09: qty 2

## 2023-10-09 MED ORDER — LIDOCAINE HCL (PF) 1 % IJ SOLN
INTRAMUSCULAR | Status: AC
  Filled 2023-10-09: qty 30

## 2023-10-09 MED ORDER — CHLORHEXIDINE GLUCONATE CLOTH 2 % EX PADS
6.0000 | MEDICATED_PAD | Freq: Every day | CUTANEOUS | Status: DC
  Administered 2023-10-09 – 2023-10-10 (×2): 6 via TOPICAL

## 2023-10-09 MED ORDER — SODIUM CHLORIDE 0.9% FLUSH
3.0000 mL | Freq: Two times a day (BID) | INTRAVENOUS | Status: DC
  Administered 2023-10-09 – 2023-10-10 (×4): 3 mL via INTRAVENOUS

## 2023-10-09 MED ORDER — OXYCODONE HCL 5 MG PO TABS
5.0000 mg | ORAL_TABLET | Freq: Four times a day (QID) | ORAL | Status: DC | PRN
  Administered 2023-10-09 – 2023-10-11 (×5): 5 mg via ORAL
  Filled 2023-10-09 (×5): qty 1

## 2023-10-09 MED ORDER — SODIUM CHLORIDE 0.9 % IV SOLN: 250.0000 mL | INTRAVENOUS | Status: AC | PRN

## 2023-10-09 MED ORDER — FUROSEMIDE 10 MG/ML IJ SOLN
INTRAMUSCULAR | Status: DC | PRN
Start: 2023-10-09 — End: 2023-10-09
  Administered 2023-10-09: 10 mg via INTRAVENOUS

## 2023-10-09 MED ORDER — TICAGRELOR 90 MG PO TABS
ORAL_TABLET | ORAL | Status: AC
  Filled 2023-10-09: qty 2

## 2023-10-09 MED ORDER — ONDANSETRON HCL 4 MG/2ML IJ SOLN: 4.0000 mg | Freq: Four times a day (QID) | INTRAMUSCULAR | Status: DC | PRN

## 2023-10-09 MED ORDER — LIDOCAINE HCL (PF) 1 % IJ SOLN
INTRAMUSCULAR | Status: DC | PRN
  Administered 2023-10-09: 2 mL via INTRADERMAL

## 2023-10-09 MED ORDER — ASPIRIN 81 MG PO TBEC: 81.0000 mg | DELAYED_RELEASE_TABLET | Freq: Every day | ORAL | Status: DC

## 2023-10-09 MED ORDER — MIDAZOLAM HCL 2 MG/2ML IJ SOLN
INTRAMUSCULAR | Status: DC | PRN
Start: 2023-10-09 — End: 2023-10-09
  Administered 2023-10-09 (×2): 1 mg via INTRAVENOUS

## 2023-10-09 MED ORDER — HEPARIN (PORCINE) IN NACL 1000-0.9 UT/500ML-% IV SOLN
INTRAVENOUS | Status: DC | PRN
  Administered 2023-10-09 (×2): 500 mL

## 2023-10-09 MED ORDER — TICAGRELOR 90 MG PO TABS: 90.0000 mg | ORAL_TABLET | Freq: Two times a day (BID) | ORAL | Status: DC

## 2023-10-09 MED ORDER — VERAPAMIL HCL 2.5 MG/ML IV SOLN
INTRAVENOUS | Status: AC
  Filled 2023-10-09: qty 2

## 2023-10-09 MED ORDER — FENTANYL CITRATE (PF) 100 MCG/2ML IJ SOLN
INTRAMUSCULAR | Status: DC | PRN
  Administered 2023-10-09 (×2): 25 ug via INTRAVENOUS

## 2023-10-09 MED ORDER — ZOLPIDEM TARTRATE 5 MG PO TABS
10.0000 mg | ORAL_TABLET | Freq: Every day | ORAL | Status: DC
  Administered 2023-10-09 – 2023-10-10 (×2): 10 mg via ORAL
  Filled 2023-10-09 (×2): qty 2

## 2023-10-09 MED ORDER — SODIUM CHLORIDE 0.9 % IV SOLN: INTRAVENOUS | Status: AC

## 2023-10-09 MED ORDER — ASPIRIN 300 MG RE SUPP: 300.0000 mg | RECTAL | Status: AC

## 2023-10-09 MED ORDER — TICAGRELOR 90 MG PO TABS
ORAL_TABLET | ORAL | Status: DC | PRN
  Administered 2023-10-09: 180 mg via ORAL

## 2023-10-09 MED ORDER — HEPARIN SODIUM (PORCINE) 1000 UNIT/ML IJ SOLN
INTRAMUSCULAR | Status: AC
  Filled 2023-10-09: qty 10

## 2023-10-09 MED ORDER — SODIUM CHLORIDE 0.9% FLUSH: 3.0000 mL | INTRAVENOUS | Status: DC | PRN

## 2023-10-09 MED ORDER — ESCITALOPRAM OXALATE 10 MG PO TABS: 10.0000 mg | ORAL_TABLET | Freq: Every day | ORAL | Status: DC | PRN

## 2023-10-09 SURGICAL SUPPLY — 22 items
BALL SAPPHIRE NC24 4.0X12 (BALLOONS) ×1 IMPLANT
BALLN EMERGE MR 2.0X15 (BALLOONS) ×1 IMPLANT
BALLN EMERGE MR 3.0X12 (BALLOONS) ×1 IMPLANT
BALLOON EMERGE MR 2.0X15 (BALLOONS) IMPLANT
BALLOON EMERGE MR 3.0X12 (BALLOONS) IMPLANT
BALLOON SAPPHIRE NC24 4.0X12 (BALLOONS) IMPLANT
CATH INFINITI 5FR ANG PIGTAIL (CATHETERS) IMPLANT
CATH INFINITI 6F FL3.5 (CATHETERS) IMPLANT
CATH LAUNCHER 6FR JR4 (CATHETERS) IMPLANT
DEVICE RAD COMP TR BAND LRG (VASCULAR PRODUCTS) IMPLANT
GLIDESHEATH SLEND SS 6F .021 (SHEATH) IMPLANT
GUIDEWIRE VAS SION BLUE 190 (WIRE) IMPLANT
KIT ENCORE 26 ADVANTAGE (KITS) IMPLANT
KIT HEMO VALVE WATCHDOG (MISCELLANEOUS) IMPLANT
KIT SYRINGE INJ CVI SPIKEX1 (MISCELLANEOUS) IMPLANT
PACK CARDIAC CATHETERIZATION (CUSTOM PROCEDURE TRAY) ×1 IMPLANT
SET ATX-X65L (MISCELLANEOUS) IMPLANT
SHEATH PROBE COVER 6X72 (BAG) IMPLANT
STENT SYNERGY XD 3.50X16 (Permanent Stent) IMPLANT
SYNERGY XD 3.50X16 (Permanent Stent) ×1 IMPLANT
WIRE EMERALD 3MM-J .035X260CM (WIRE) IMPLANT
WIRE HI TORQ VERSACORE-J 145CM (WIRE) IMPLANT

## 2023-10-09 NOTE — ED Triage Notes (Signed)
 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   Pt began having CP radiates to left arm and left neck.  EMS noticed ST elevation inferior - EMS 324 mg PO.  Pt has 1 /10 pain - was 10/10 diaphoretic and pale.  Has elevated trops last week but no ST elevation.  Recent Triple Bipass 09/2022.

## 2023-10-09 NOTE — H&P (Addendum)
 Cardiology Admission History and Physical   Patient ID: [name] MRN: [id number]; DOB: 02-Dec-1970   Admission date: 10/09/2023  PCP:  Charlott Rakes, MD   [facility] Providers Cardiologist:  Verne Carrow, MD        Chief Complaint:  Chest pain. Inferior ST elevation MI  Patient Profile:   [name] is a 53 y.o. female with  a history of CAD s/p CABG x3 (LIMA-LAD, SVG-Diag, SVG-OM1) in 09/2022, ischemic cardiomyopathy with EF of 35-40% on Echo in 07/2022 but improved to 55-60% on repeat Echo in 12/2022, palpitations with short runs of both NSVT and SVT as well as rare PACs/ PVCs noted on monitor in 07/2022, hyperlipidemia, GERD, rheumatoid arthritis, fibromyalgia, and anxiety  and tobacco use dx who is being seen 10/09/2023 for the evaluation of chest pain/inferior STEMI.  History of Present Illness:   [name] is a 53 y.o. female with  a history of CAD s/p CABG x3 (LIMA-LAD, SVG-Diag, SVG-OM1) in 09/2022, ischemic cardiomyopathy with EF of 35-40% on Echo in 07/2022 but improved to 55-60% on repeat Echo in 12/2022, palpitations with short runs of both NSVT and SVT as well as rare PACs/ PVCs noted on monitor in 07/2022, hyperlipidemia, GERD, rheumatoid arthritis, fibromyalgia, and anxiety  and tobacco use dx who is being seen 10/09/2023 for the evaluation of chest pain/inferior STEMI.  Pt presenting with chest pain that occurred at rest while at a Grocery store and radiating to the left arm, neck, EMS EKG showed inferior ST elevations with reciprocal changes and then subsided immediately with resolution of ST elevations. She was given full dose aspirin and brought to the ER. NTG was not given due to STE.  Pt reports 1 week ago was hospitalized for URTI but recovered. She continues to smoke.  ER eval- sinus tachycardia, no ST elevations, currently was chest pain free. She was transported to cath lab urgently for [facility] and possible graft angiography +/-  PCI.   Prior Cardiac workup  ECHO 12/2022 IMPRESSIONS     1. Left ventricular ejection fraction, by estimation, is 55 to 60%. Left  ventricular ejection fraction by PLAX is 59 %. The left ventricle has  normal function. The left ventricle demonstrates regional wall motion  abnormalities (see scoring  diagram/findings for description). There is mild left ventricular  hypertrophy. Left ventricular diastolic parameters are consistent with  Grade I diastolic dysfunction (impaired relaxation). There is moderate  hypokinesis of the left ventricular, basal-mid   septal wall.   2. Right ventricular systolic function is moderately reduced. The right  ventricular size is normal. Tricuspid regurgitation signal is inadequate  for assessing PA pressure.   3. The mitral valve is abnormal. Trivial mitral valve regurgitation.   4. The aortic valve is tricuspid. Aortic valve regurgitation is not  visualized. Aortic valve sclerosis is present, with no evidence of aortic  valve stenosis.   5. Aortic dilatation noted. There is borderline dilatation of the  ascending aorta, measuring 37 mm.   6. The inferior vena cava is normal in size with greater than 50%  respiratory variability, suggesting right atrial pressure of 3 mmHg.   7. Cannot exclude a small PFO.    LHC:08/2022 Conclusions: Multivessel coronary artery disease, as detailed below.  Most significant disease involves the mid LAD, where sequential 90% and 70% lesions flank an aneurysmal segment where a large D2 branch arises from.  The LAD lesions are highly significant by RFR (RFR 0.46).  D2 also  contains a 70% proximal stenosis.  LCx and RCA demonstrate non-obstructive coronary artery disease (RFR mid RCA 0.94). Upper normal to mildly elevated left heart filling pressures (LVEDP 17 mmHg, PCWP 13 mmHg). Normal right heart and pulmonary artery pressures (mean RA 6 mmHg, mean PAP 18 mmHg). Mildly reduced Fick cardiac output/index (CO 4.0 L/min,  CI 2.2 L/min/m^2).   Recommendations: Given complex anatomy of LAD/D2 disease, recommend cardiac surgery consultation and [facility] discussion regarding optimal management strategy. Discontinue diltiazem and start metoprolol succinate 25 mg daily in the setting of cardiomyophathy. Restart apixaban tomorrow morning if no evidence of bleeding/vascular complications at catheterization sites. Aggressive secondary prevention of coronary artery disease.  Consider escalation of statin therapy to high-intensity dosing at follow-up.   Past Medical History:  Diagnosis Date   A-fib (HCC)    Anxiety    Arthralgia    Asthma    Attention-deficit hyperactivity disorder, predominantly inattentive type    Back pain    Coronary artery disease    Dysrhythmia    Afib   Fear of flying    Fibromyalgia    denies having Fibromyalgia   Gastroesophageal reflux disease    HPV in female    Joint pain    Localized edema    Neck pain    OA (osteoarthritis)    Papillomavirus as the cause of diseases classified elsewhere    Primary insomnia    Pure hypercholesterolemia    Rheumatoid arthritis, seronegative, hand, unspecified laterality (HCC)    patient feels it is back related, not a true arthritis   Spondylolisthesis of lumbosacral region    Tobacco dependence    Unspecified inflammatory spondylopathy, cervical region (HCC)    Vitamin B12 deficiency anemia    Voice hoarseness     Past Surgical History:  Procedure Laterality Date   ABDOMINAL EXPOSURE N/A 06/28/2020   Procedure: ABDOMINAL EXPOSURE;  Surgeon: Cephus Shelling, MD;  Location: [facility] OR;  Service: Vascular;  Laterality: N/A;  anterior   ANTERIOR LUMBAR FUSION N/A 06/28/2020   Procedure: ANTERIOR LUMBAR INTERBODY FUSION LUMBAR FIVE-SACRAL ONE.;  Surgeon: Tia Alert, MD;  Location: [facility] OR;  Service: Neurosurgery;  Laterality: N/A;  anterior approach   BIOPSY  01/28/2023   Procedure: BIOPSY;  Surgeon: Lynann Bologna, DO;  Location:  [facility] ENDOSCOPY;  Service: Gastroenterology;;   BREAST ENHANCEMENT SURGERY     CARDIAC CATHETERIZATION     CARPAL TUNNEL RELEASE Left    CARPAL TUNNEL RELEASE Right 06/28/2020   Procedure: CARPAL TUNNEL RELEASE;  Surgeon: Tia Alert, MD;  Location: [facility] OR;  Service: Neurosurgery;  Laterality: Right;   CERVICAL SPINE SURGERY     CHOLECYSTECTOMY     CLIPPING OF ATRIAL APPENDAGE N/A 10/08/2022   Procedure: CLIPPING OF ATRIAL APPENDAGE USING ATRICURE ATRICLIP PRO2 SIZE 35;  Surgeon: Alleen Borne, MD;  Location: [facility] OR;  Service: Open Heart Surgery;  Laterality: N/A;   CORONARY ARTERY BYPASS GRAFT N/A 10/08/2022   Procedure: CORONARY ARTERY BYPASS GRAFTING (CABG) X3 USING LEFT INTERNAL MAMMARY ARTERY AND RIGHT GREATER SAPHENOUS VEIN HARVESTED ENDOSCOPICALLY;  Surgeon: Alleen Borne, MD;  Location: [facility] OR;  Service: Open Heart Surgery;  Laterality: N/A;   CORONARY PRESSURE/FFR STUDY N/A 09/07/2022   Procedure: INTRAVASCULAR PRESSURE WIRE/FFR STUDY;  Surgeon: Yvonne Kendall, MD;  Location: [facility] INVASIVE CV LAB;  Service: Cardiovascular;  Laterality: N/A;   ESOPHAGOGASTRODUODENOSCOPY N/A 01/28/2023   Procedure: ESOPHAGOGASTRODUODENOSCOPY (EGD);  Surgeon: Lynann Bologna, DO;  Location: [facility];  Service: Gastroenterology;  Laterality: N/A;   FOOT SURGERY     MANDIBLE SURGERY     MAZE N/A 10/08/2022   Procedure: PULMONARY VEIN ABLATION USING ATRICURE ISOLATOR SYNERGY ENCOMPASS OLH;  Surgeon: Alleen Borne, MD;  Location: [facility] OR;  Service: Open Heart Surgery;  Laterality: N/A;   RIGHT/LEFT HEART CATH AND CORONARY ANGIOGRAPHY N/A 09/07/2022   Procedure: RIGHT/LEFT HEART CATH AND CORONARY ANGIOGRAPHY;  Surgeon: Yvonne Kendall, MD;  Location: [facility] INVASIVE CV LAB;  Service: Cardiovascular;  Laterality: N/A;   TEE WITHOUT CARDIOVERSION N/A 10/08/2022   Procedure: TRANSESOPHAGEAL ECHOCARDIOGRAM;  Surgeon: Alleen Borne, MD;  Location: [facility] OR;  Service: Open Heart Surgery;  Laterality: N/A;    TONSILLECTOMY     TUBAL LIGATION     with ablation   TYMPANOSTOMY TUBE PLACEMENT       Medications Prior to Admission: Prior to Admission medications   Medication Sig Start Date End Date Taking? Authorizing Provider  albuterol (VENTOLIN HFA) 108 (90 Base) MCG/ACT inhaler Inhale 2 puffs into the lungs every 4 (four) hours as needed for wheezing or shortness of breath. 10/15/22   Leary Roca, PA-C  amphetamine-dextroamphetamine (ADDERALL XR) 30 MG 24 hr capsule Take 60 mg by mouth daily.    [provider]  atorvastatin (LIPITOR) 80 MG tablet Take 1 tablet (80 mg total) by mouth daily. 09/06/23   Kathleene Hazel, MD  cyanocobalamin (VITAMIN B12) 1000 MCG tablet Take 1,000 mcg by mouth daily.    [provider]  dexamethasone (DECADRON) 1 MG tablet SMARTSIG:1 Tablet(s) By Mouth 03/29/23   [provider]  dicyclomine (BENTYL) 20 MG tablet Take by mouth. 04/01/23   [provider]  escitalopram (LEXAPRO) 10 MG tablet Take 10 mg by mouth daily as needed (anxiety).    [provider]  ferrous sulfate 325 (65 FE) MG EC tablet Take 325 mg by mouth daily. Takes as needed    [provider]  furosemide (LASIX) 20 MG tablet Take 1 tablet (20 mg total) by mouth daily as needed (for lower extremity edema). 11/02/22   Sharlene Dory, PA-C  gabapentin (NEURONTIN) 100 MG capsule Take 100-200 mg by mouth 4 (four) times daily as needed (pain).    [provider]  gabapentin (NEURONTIN) 300 MG capsule Take 600 mg by mouth daily as needed (pain).    [provider]  metoprolol succinate (TOPROL-XL) 50 MG 24 hr tablet Take 1 tablet (50 mg total) by mouth 2 (two) times daily. Take with or immediately following a meal. 02/25/23   Kathleene Hazel, MD  nicotine (NICODERM CQ - DOSED IN MG/24 HOURS) 21 mg/24hr patch Place 1 patch (21 mg total) onto the skin daily. 01/28/23   Meredeth Ide, MD  nitroGLYCERIN (NITROSTAT) 0.4 MG SL  tablet PLACE 1 TABLET UNDER THE TONGUE EVERY 5 MINUTES AS NEEDED FOR CHEST PAIN. 10/05/23   Kathleene Hazel, MD  VITAMIN D, CHOLECALCIFEROL, PO Take 1 tablet by mouth daily.    [provider]  zolpidem (AMBIEN) 10 MG tablet Take 10 mg by mouth at bedtime.    [provider]     Allergies:    Allergies  Allergen Reactions   Zoloft [Sertraline] Other (See Comments)    Altered mental state    Social History:   Social History   Socioeconomic History   Marital status: Married    Spouse name: Not on file   Number of children: 2   Years of education: 31  Highest education level: Some college, no degree  Occupational History   Occupation: unemployed  Tobacco Use   Smoking status: Every Day    Current packs/day: 1.00    Average packs/day: 1 pack/day for 31.2 years (31.2 ttl pk-yrs)    Types: Cigarettes    Start date: 64   Smokeless tobacco: Never  Vaping Use   Vaping status: Never Used  Substance and Sexual Activity   Alcohol use: Yes    Comment: occasional   Drug use: Never   Sexual activity: Not on file  Other Topics Concern   Not on file  Social History Narrative   Not on file   Social Drivers of Health   Financial Resource Strain: Low Risk  (10/02/2023)   Received from [facility]   Overall Financial Resource Strain (CARDIA)    Difficulty of Paying Living Expenses: Not very hard  Food Insecurity: No Food Insecurity (10/02/2023)   Received from [facility]   Hunger Vital Sign    Worried About Running Out of Food in the Last Year: Never true    Ran Out of Food in the Last Year: Never true  Transportation Needs: No Transportation Needs (10/02/2023)   Received from [facility]   PRAPARE - Transportation    Lack of Transportation (Medical): No    Lack of Transportation (Non-Medical): No  Physical Activity: Not on file  Stress: Not on file  Social Connections: Unknown (03/25/2023)   Received from [facility]   Social Network     Social Network: Not on file  Intimate Partner Violence: Not At Risk (09/30/2023)   Received from [facility]   Humiliation, Afraid, Rape, and Kick questionnaire    Fear of Current or Ex-Partner: No    Emotionally Abused: No    Physically Abused: No    Sexually Abused: No    Family History:   The patient's family history includes Heart disease in her father; Hypertension in her father.    ROS:  Please see the history of present illness.  All other ROS reviewed and negative.     Physical Exam/Data:   Vitals:   10/09/23 1727 10/09/23 1730 10/09/23 1731 10/09/23 1735  Pulse:  (!) 101  (!) 103  Resp:  20  (!) 27  Temp: 98.3 F (36.8 C)     TempSrc: Oral     SpO2:  100% 96% 100%  Weight: 81.6 kg     Height: 5\' 2"  (1.575 m)      No intake or output data in the 24 hours ending 10/09/23 1757    10/09/2023    5:27 PM 05/26/2023    9:31 AM 03/25/2023    5:22 AM  Last 3 Weights  Weight (lbs) 179 lb 14.3 oz 180 lb 180 lb  Weight (kg) 81.6 kg 81.647 kg 81.647 kg     Body mass index is 32.9 kg/m.  General:  Well nourished, well developed, in no acute distress HEENT: normal Neck: no JVD Vascular: No carotid bruits; Distal pulses 2+ bilaterally   Cardiac:  normal S1, S2; RRR; no murmur  Lungs:  clear to auscultation bilaterally, no wheezing, rhonchi or rales  Abd: soft, nontender, no hepatomegaly  Ext: no edema Musculoskeletal:  No deformities, BUE and BLE strength normal and equal Skin: warm and dry  Neuro:  CNs 2-12 intact, no focal abnormalities noted Psych:  Normal affect    EKG:  The ECG that was done  was personally reviewed and demonstrates  Relevant CV Studies:  Laboratory Data:  High Sensitivity Troponin:  No results for input(s): "TROPONINIHS" in the last 720 hours.    ChemistryNo results for input(s): "NA", "K", "CL", "CO2", "GLUCOSE", "BUN", "CREATININE", "CALCIUM", "MG", "GFRNONAA", "GFRAA", "ANIONGAP" in the last 168 hours.  No results for input(s):  "PROT", "ALBUMIN", "AST", "ALT", "ALKPHOS", "BILITOT" in the last 168 hours. Lipids No results for input(s): "CHOL", "TRIG", "HDL", "LABVLDL", "LDLCALC", "CHOLHDL" in the last 168 hours. Hematology Recent Labs  Lab 10/09/23 1734  WBC 9.1  RBC 4.88  HGB 15.4*  HCT 47.9*  MCV 98.2  MCH 31.6  MCHC 32.2  RDW 13.2  PLT 348   Thyroid No results for input(s): "TSH", "FREET4" in the last 168 hours. BNPNo results for input(s): "BNP", "PROBNP" in the last 168 hours.  DDimer No results for input(s): "DDIMER" in the last 168 hours.   Radiology/Studies:  No results found.   Assessment and Plan:   Inferior STEMI (ST elevations and chest pain resolved) but were dynamic and STE occurring with each CP CAD s/p CABGx3 ((LIMA-LAD, SVG-Diag, SVG-OM1) in 09/2022 Ischemic CMP, recovered EF 55-60% (12/2022) HTN, HLD, Fibromyalgia, RA Tobacco use dx H/p SVT/PVCs   Plan:; - emergent coronary angiography. Continue DAPT, statin therapy.. Continue home meds. Check labs - aggressive risk factor modification. Need to absoultely quit smoking, weight loss encouraged. - nicotine patch Check echo in am.  Full code   Adddendum: S/p PCI to mid RCA, grafts to OM and Diagonal open. LIMA not engaged.  - continue aspirin + brilinta - continue statin, add ezetimibe - trend lactic acid (3.6--> 2.4). If persistently high then ?IV milrinone   LVEDP was high 20 s/p IV lasix in the cath lab. Monitor volume status.  -HOLD metoprolol  Risk Assessment/Risk Scores:   TIMI Risk Score for ST  Elevation MI:   The patient's TIMI risk score is 2, which indicates a 2.2% risk of all cause mortality at 30 days.       Code Status: Full Code  Severity of Illness: The appropriate patient status for this patient is INPATIENT. Inpatient status is judged to be reasonable and necessary in order to provide the required intensity of service to ensure the patient's safety. The patient's presenting symptoms, physical exam  findings, and initial radiographic and laboratory data in the context of their chronic comorbidities is felt to place them at high risk for further clinical deterioration. Furthermore, it is not anticipated that the patient will be medically stable for discharge from the hospital within 2 midnights of admission.   * I certify that at the point of admission it is my clinical judgment that the patient will require inpatient hospital care spanning beyond 2 midnights from the point of admission due to high intensity of service, high risk for further deterioration and high frequency of surveillance required.*   For questions or updates, please contact [facility] Please consult [email or web address] for contact info under     Signed, Elmon Kirschner, MD  10/09/2023 5:57 PM

## 2023-10-09 NOTE — ED Notes (Signed)
 To CATH LAB

## 2023-10-09 NOTE — ED Notes (Signed)
 Dr Adela Lank and Cards at bedside

## 2023-10-09 NOTE — ED Notes (Signed)
 All pt belongings given to Loni Beckwith (pt's daughter), per pt's request.  Pt stated she had cash and cell phone.  All given to Autumn.  Daughter requested that pt's husband (they are separated presently) not be allowed to see pt.  However, registration stated the whole family would then be blocked so suggested pt deal with this when awake.  Autumn notified.  The name of the husband is Hinton Dyer "Trey Paula" Mcfarland and he is not allowed to see pt.

## 2023-10-09 NOTE — ED Provider Notes (Signed)
 [place] EMERGENCY DEPARTMENT AT [facility] Provider Note   CSN: [id number] Arrival date & time: 10/09/23  1722     History  Chief Complaint  Patient presents with   Code STEMI    [name] is a 53 y.o. female.  Patient complains of severe chest pain.  Patient was at the grocery store and pain became severe EMS was called.  EMS reports patient meets the criteria for STEMI.  Patient reports that she had 4 vessel coronary artery bypass graft in March 2024.  Patient denies any current blood thinners.  Patient has a past medical history of hypertension elevated cholesterol and coronary artery disease.  Patient was given aspirin by EMS.  The history is provided by the patient and the EMS personnel. No language interpreter was used.       Home Medications Prior to Admission medications   Medication Sig Start Date End Date Taking? Authorizing Provider  albuterol (VENTOLIN HFA) 108 (90 Base) MCG/ACT inhaler Inhale 2 puffs into the lungs every 4 (four) hours as needed for wheezing or shortness of breath. 10/15/22   Leary Roca, PA-C  amphetamine-dextroamphetamine (ADDERALL XR) 30 MG 24 hr capsule Take 60 mg by mouth daily.    [provider]  atorvastatin (LIPITOR) 80 MG tablet Take 1 tablet (80 mg total) by mouth daily. 09/06/23   Kathleene Hazel, MD  cyanocobalamin (VITAMIN B12) 1000 MCG tablet Take 1,000 mcg by mouth daily.    [provider]  dexamethasone (DECADRON) 1 MG tablet SMARTSIG:1 Tablet(s) By Mouth 03/29/23   [provider]  dicyclomine (BENTYL) 20 MG tablet Take by mouth. 04/01/23   [provider]  escitalopram (LEXAPRO) 10 MG tablet Take 10 mg by mouth daily as needed (anxiety).    [provider]  ferrous sulfate 325 (65 FE) MG EC tablet Take 325 mg by mouth daily. Takes as needed    [provider]  furosemide (LASIX) 20 MG tablet Take 1 tablet (20 mg total) by mouth daily as needed  (for lower extremity edema). 11/02/22   Sharlene Dory, PA-C  gabapentin (NEURONTIN) 100 MG capsule Take 100-200 mg by mouth 4 (four) times daily as needed (pain).    [provider]  gabapentin (NEURONTIN) 300 MG capsule Take 600 mg by mouth daily as needed (pain).    [provider]  metoprolol succinate (TOPROL-XL) 50 MG 24 hr tablet Take 1 tablet (50 mg total) by mouth 2 (two) times daily. Take with or immediately following a meal. 02/25/23   Kathleene Hazel, MD  nicotine (NICODERM CQ - DOSED IN MG/24 HOURS) 21 mg/24hr patch Place 1 patch (21 mg total) onto the skin daily. 01/28/23   Meredeth Ide, MD  nitroGLYCERIN (NITROSTAT) 0.4 MG SL tablet PLACE 1 TABLET UNDER THE TONGUE EVERY 5 MINUTES AS NEEDED FOR CHEST PAIN. 10/05/23   Kathleene Hazel, MD  VITAMIN D, CHOLECALCIFEROL, PO Take 1 tablet by mouth daily.    [provider]  zolpidem (AMBIEN) 10 MG tablet Take 10 mg by mouth at bedtime.    [provider]      Allergies    Zoloft [sertraline]    Review of Systems   Review of Systems  Respiratory:  Positive for chest tightness.   Cardiovascular:  Positive for chest pain.  Gastrointestinal:  Positive for nausea.  Neurological:  Positive for dizziness.  All other systems reviewed and are negative.   Physical Exam Updated Vital Signs  Pulse (!) 103   Temp 98.3 F (36.8 C) (Oral)   Resp (!) 27   Ht 5\' 2"  (1.575 m)   Wt 81.6 kg   LMP  (LMP Unknown)   SpO2 100%   BMI 32.90 kg/m  Physical Exam Vitals and nursing note reviewed.  Constitutional:      Appearance: She is well-developed.  HENT:     Head: Normocephalic.     Mouth/Throat:     Mouth: Mucous membranes are moist.  Eyes:     Extraocular Movements: Extraocular movements intact.     Pupils: Pupils are equal, round, and reactive to light.  Cardiovascular:     Rate and Rhythm: Tachycardia present.  Pulmonary:     Effort: Pulmonary effort is normal.  Abdominal:      General: There is no distension.  Musculoskeletal:        General: Normal range of motion.     Cervical back: Normal range of motion.  Skin:    General: Skin is warm.  Neurological:     General: No focal deficit present.     Mental Status: She is alert and oriented to person, place, and time.  Psychiatric:     Comments: anxious     ED Results / Procedures / Treatments   Labs (all labs ordered are listed, but only abnormal results are displayed) Labs Reviewed  CBC WITH DIFFERENTIAL/PLATELET  PROTIME-INR  APTT  COMPREHENSIVE METABOLIC PANEL WITH GFR  LIPID PANEL  HCG, SERUM, QUALITATIVE  I-STAT CG4 LACTIC ACID, ED  TROPONIN I (HIGH SENSITIVITY)    EKG None  Radiology No results found.  Procedures Procedures    Medications Ordered in ED Medications  0.9 %  sodium chloride infusion (has no administration in time range)  aspirin chewable tablet 324 mg (324 mg Oral Not Given 10/09/23 1736)  Heparin (Porcine) in NaCl 1000-0.9 UT/500ML-% SOLN (500 mLs  Given 10/09/23 1735)  heparin injection 4,000 Units (4,000 Units Intravenous Given 10/09/23 1736)    ED Course/ Medical Decision Making/ A&P                                 Medical Decision Making Patient brought in by EMS.  Patient began having chest pain while shopping.  EMS reports ST elevation on patient's EKG  Amount and/or Complexity of Data Reviewed Independent Historian: EMS    Details: Patient was given aspirin by EMS Labs: ordered.    Details: Labs ordered and reviewed ECG/medicine tests: ordered and independent interpretation performed. Decision-making details documented in ED Course.  Risk OTC drugs. Prescription drug management. Risk Details: Cardiology responded and pt taken to cath lab.            Final Clinical Impression(s) / ED Diagnoses Final diagnoses:  ST elevation myocardial infarction (STEMI), unspecified artery [facility])    Rx / DC Orders ED Discharge Orders     None          Elson Areas, Cordelia Poche 10/09/23 1746    Melene Plan, DO 10/09/23 [place]

## 2023-10-10 ENCOUNTER — Inpatient Hospital Stay (HOSPITAL_COMMUNITY): Payer: Self-pay

## 2023-10-10 ENCOUNTER — Other Ambulatory Visit: Payer: Self-pay

## 2023-10-10 DIAGNOSIS — I2119 ST elevation (STEMI) myocardial infarction involving other coronary artery of inferior wall: Secondary | ICD-10-CM | POA: Diagnosis not present

## 2023-10-10 DIAGNOSIS — I2489 Other forms of acute ischemic heart disease: Secondary | ICD-10-CM

## 2023-10-10 LAB — LIPID PANEL
Cholesterol: 180 mg/dL (ref 0–200)
HDL: 42 mg/dL (ref >40)
LDL Cholesterol: 69 mg/dL (ref 0–99)
Total CHOL/HDL Ratio: 4.3 ratio
Triglycerides: 343 mg/dL — ABNORMAL HIGH (ref <150)
VLDL: 69 mg/dL — ABNORMAL HIGH (ref 0–40)

## 2023-10-10 LAB — CBC
HCT: 45.6 % (ref 36.0–46.0)
Hemoglobin: 15.1 g/dL — ABNORMAL HIGH (ref 12.0–15.0)
MCH: 31.5 pg (ref 26.0–34.0)
MCHC: 33.1 g/dL (ref 30.0–36.0)
MCV: 95 fL (ref 80.0–100.0)
Platelets: 361 10*3/uL (ref 150–400)
RBC: 4.8 MIL/uL (ref 3.87–5.11)
RDW: 13.2 % (ref 11.5–15.5)
WBC: 8 10*3/uL (ref 4.0–10.5)
nRBC: 0 % (ref 0.0–0.2)

## 2023-10-10 LAB — ECHOCARDIOGRAM COMPLETE
AR max vel: 2.07 cm2
AV Peak grad: 4.5 mmHg
Ao pk vel: 1.06 m/s
Area-P 1/2: 5.5 cm2
Height: 62 in
S' Lateral: 3.8 cm
Weight: 2927.71 [oz_av]

## 2023-10-10 LAB — BASIC METABOLIC PANEL WITH GFR
Anion gap: 9 (ref 5–15)
BUN: 12 mg/dL (ref 6–20)
CO2: 24 mmol/L (ref 22–32)
Calcium: 9.4 mg/dL (ref 8.9–10.3)
Chloride: 104 mmol/L (ref 98–111)
Creatinine, Ser: 0.72 mg/dL (ref 0.44–1.00)
GFR, Estimated: >60 mL/min (ref >60)
Glucose, Bld: 111 mg/dL — ABNORMAL HIGH (ref 70–99)
Potassium: 3.6 mmol/L (ref 3.5–5.1)
Sodium: 137 mmol/L (ref 135–145)

## 2023-10-10 LAB — LACTIC ACID, PLASMA: Lactic Acid, Venous: 1.3 mmol/L (ref 0.5–1.9)

## 2023-10-10 MED ORDER — SPIRONOLACTONE 25 MG PO TABS
25.0000 mg | ORAL_TABLET | Freq: Every day | ORAL | Status: DC
  Administered 2023-10-10 – 2023-10-11 (×2): 25 mg via ORAL
  Filled 2023-10-10 (×2): qty 1

## 2023-10-10 MED ORDER — LOSARTAN POTASSIUM 25 MG PO TABS
12.5000 mg | ORAL_TABLET | Freq: Every day | ORAL | Status: DC
  Administered 2023-10-10 – 2023-10-11 (×2): 12.5 mg via ORAL
  Filled 2023-10-10 (×2): qty 1

## 2023-10-10 MED ORDER — ASPIRIN 81 MG PO TBEC
81.0000 mg | DELAYED_RELEASE_TABLET | Freq: Every day | ORAL | Status: DC
  Administered 2023-10-10 – 2023-10-11 (×2): 81 mg via ORAL
  Filled 2023-10-10 (×2): qty 1

## 2023-10-10 MED ORDER — TICAGRELOR 90 MG PO TABS
90.0000 mg | ORAL_TABLET | Freq: Two times a day (BID) | ORAL | Status: DC
  Administered 2023-10-10 – 2023-10-11 (×3): 90 mg via ORAL
  Filled 2023-10-10 (×3): qty 1

## 2023-10-10 MED ORDER — PERFLUTREN LIPID MICROSPHERE
1.0000 mL | INTRAVENOUS | Status: AC | PRN
  Administered 2023-10-10: 2 mL via INTRAVENOUS

## 2023-10-10 MED ORDER — METOPROLOL SUCCINATE ER 50 MG PO TB24
50.0000 mg | ORAL_TABLET | Freq: Two times a day (BID) | ORAL | Status: DC
  Administered 2023-10-10 – 2023-10-11 (×3): 50 mg via ORAL
  Filled 2023-10-10 (×3): qty 1

## 2023-10-10 NOTE — Progress Notes (Signed)
 Advanced Heart Failure Rounding Note  Cardiologist: Verne Carrow, MD  Chief Complaint:  Subjective:    Patient with no further chest burning, though concerned about her recent headaches, admission for gastric symptoms, and ongoing swallowing difficulties she has been having.   Recent extensive workup at [facility], has outpatient follow up scehduled. We discussed her recent STEMI and need for improved lipid control given her high atherosclerotic burden. Would also benefit from cardiac rehab and supported education.  Plan to transfer to the floor, patient concerned about going home despite reassurance. Hopeful d/c tomorrow.    Objective:   Weight Range: 83 kg Body mass index is 33.47 kg/m.   Vital Signs:   Temp:  [97.8 F (36.6 C)-98.3 F (36.8 C)] 97.8 F (36.6 C) (03/30 0700) Pulse Rate:  [99-109] 106 (03/30 1200) Resp:  [14-32] 19 (03/30 1200) BP: (96-123)/(71-87) 104/74 (03/30 1200) SpO2:  [88 %-100 %] 94 % (03/30 1200) Weight:  [81.6 kg-83 kg] 83 kg (03/30 0345) Last BM Date :  (PTA)  Weight change: Filed Weights   10/09/23 1727 10/10/23 0345  Weight: 81.6 kg 83 kg    Intake/Output:   Intake/Output Summary (Last 24 hours) at 10/10/2023 1308 Last data filed at 10/10/2023 1200 Gross per 24 hour  Intake 1519.03 ml  Output 1150 ml  Net 369.03 ml      Physical Exam     GENERAL: Well nourished and in no apparent distress at rest.  PULM:  Normal work of breathing, clear to auscultation bilaterally. Respirations are unlabored.  CARDIAC:  JVP: flat        Tachycardic with regular rhythm. No murmurs, rubs or gallops.  Trace edema. Warm and well perfused extremities. ABDOMEN: Soft, non-tender, non-distended. NEUROLOGIC: Patient is oriented x3 with no focal or lateralizing neurologic deficits.    Labs    CBC Recent Labs    10/09/23 1734 10/10/23 0345  WBC 9.1 8.0  NEUTROABS 5.9  --   HGB 15.4* 15.1*  HCT 47.9* 45.6  MCV 98.2 95.0  PLT 348 361    Basic Metabolic Panel Recent Labs    16/10/96 1810 10/10/23 0345  NA 134* 137  K 3.6 3.6  CL 104 104  CO2 19* 24  GLUCOSE 136* 111*  BUN 10 12  CREATININE 0.74 0.72  CALCIUM 9.4 9.4   Liver Function Tests Recent Labs    10/09/23 1810  AST 23  ALT 23  ALKPHOS 66  BILITOT 0.5  PROT 5.8*  ALBUMIN 3.4*   No results for input(s): "LIPASE", "AMYLASE" in the last 72 hours. Cardiac Enzymes No results for input(s): "CKTOTAL", "CKMB", "CKMBINDEX", "TROPONINI" in the last 72 hours.  BNP: BNP (last 3 results) Recent Labs    01/27/23 1408  BNP 53.5    ProBNP (last 3 results) No results for input(s): "PROBNP" in the last 8760 hours.   D-Dimer No results for input(s): "DDIMER" in the last 72 hours. Hemoglobin A1C No results for input(s): "HGBA1C" in the last 72 hours. Fasting Lipid Panel Recent Labs    10/10/23 0345  CHOL 180  HDL 42  LDLCALC 69  TRIG 343*  CHOLHDL 4.3   Thyroid Function Tests No results for input(s): "TSH", "T4TOTAL", "T3FREE", "THYROIDAB" in the last 72 hours.  Invalid input(s): "FREET3"  Other results:    Medications:     Scheduled Medications:  amphetamine-dextroamphetamine  60 mg Oral Daily   aspirin EC  81 mg Oral Daily   atorvastatin  80 mg Oral Daily  Chlorhexidine Gluconate Cloth  6 each Topical Daily   Chlorhexidine Gluconate Cloth  6 each Topical Daily   cyanocobalamin  1,000 mcg Oral Daily   ezetimibe  10 mg Oral Daily   losartan  12.5 mg Oral Daily   metoprolol succinate  50 mg Oral BID   nicotine  21 mg Transdermal Daily   sodium chloride flush  3 mL Intravenous Q12H   spironolactone  25 mg Oral Daily   ticagrelor  90 mg Oral BID   zolpidem  10 mg Oral QHS    Infusions:  sodium chloride 20 mL/hr at 10/10/23 1200   sodium chloride      PRN Medications: sodium chloride, acetaminophen, albuterol, escitalopram, gabapentin, nitroGLYCERIN, ondansetron (ZOFRAN) IV, mouth rinse, oxyCODONE, sodium chloride  flush    Patient Profile    [name] is a 53 y.o. female with  a history of CAD s/p CABG x3 (LIMA-LAD, SVG-Diag, SVG-OM1) in 09/2022, ischemic cardiomyopathy hyperlipidemia, GERD, rheumatoid arthritis, fibromyalgia, and anxiety  and tobacco use dx who is being seen 10/09/2023 for the evaluation of chest pain/inferior STEMI.   Assessment/Plan   Inferior STEMI: History of CABG x3 (LIMA-LAD, SVG-Diag, SVG-OM1), known RCA disease but FFR negative at the time. RCA STEMI with revascularization yesterday, otherwise stable disease. - Needs lipid clinic at discharge - Continue ticagrelor/aspirin - Atorvastatin 80mg  daily/zetia 10mg  daily - Lipid clinic at discharge - Echocardiogram with EF ~40%  HFrEF: Prior reduced EF, now ~40% with inferior wall WMA. -Continue spironolactone 25mg  daily -start low dose losartan 12.5mg  daily - continue home metoprolol - Euvolemic, no need for diuretics  HLD:  -Lipid clinic referral as above  Transfer to floor, team C tomorrow for hopeful discharge     Medication concerns reviewed with patient and pharmacy team. Barriers identified: DAPT therapy  Length of Stay: 1  Romie Minus, MD  10/10/2023, 1:08 PM  Advanced Heart Failure Team Pager [phone] (M-F; 7a - 5p)  Please contact [facility] for night-coverage after hours (5p -7a ) and weekends on [email or web address]

## 2023-10-10 NOTE — Progress Notes (Signed)
 Pt placed as XXX/Confidential per her request.  Pt is very afraid of her ex husband Hinton Dyer "[name] who currently is in ED.  She has provided names of visitors who many visit and a password.   She authorized the following visitors who know the current room she is in: [name]

## 2023-10-11 ENCOUNTER — Other Ambulatory Visit (HOSPITAL_COMMUNITY): Payer: Self-pay

## 2023-10-11 ENCOUNTER — Telehealth (HOSPITAL_COMMUNITY): Payer: Self-pay | Admitting: Pharmacy Technician

## 2023-10-11 ENCOUNTER — Encounter (HOSPITAL_COMMUNITY): Payer: Self-pay | Admitting: Internal Medicine

## 2023-10-11 DIAGNOSIS — F17200 Nicotine dependence, unspecified, uncomplicated: Secondary | ICD-10-CM

## 2023-10-11 DIAGNOSIS — E785 Hyperlipidemia, unspecified: Secondary | ICD-10-CM | POA: Insufficient documentation

## 2023-10-11 DIAGNOSIS — I5020 Unspecified systolic (congestive) heart failure: Secondary | ICD-10-CM | POA: Insufficient documentation

## 2023-10-11 DIAGNOSIS — Z720 Tobacco use: Secondary | ICD-10-CM | POA: Insufficient documentation

## 2023-10-11 DIAGNOSIS — F411 Generalized anxiety disorder: Secondary | ICD-10-CM

## 2023-10-11 DIAGNOSIS — I214 Non-ST elevation (NSTEMI) myocardial infarction: Secondary | ICD-10-CM | POA: Diagnosis not present

## 2023-10-11 DIAGNOSIS — E7800 Pure hypercholesterolemia, unspecified: Secondary | ICD-10-CM

## 2023-10-11 DIAGNOSIS — I25110 Atherosclerotic heart disease of native coronary artery with unstable angina pectoris: Secondary | ICD-10-CM

## 2023-10-11 LAB — POCT I-STAT, CHEM 8
BUN: 10 mg/dL (ref 6–20)
Calcium, Ion: 1.27 mmol/L (ref 1.15–1.40)
Chloride: 103 mmol/L (ref 98–111)
Creatinine, Ser: 0.6 mg/dL (ref 0.44–1.00)
Glucose, Bld: 133 mg/dL — ABNORMAL HIGH (ref 70–99)
HCT: 42 % (ref 36.0–46.0)
Hemoglobin: 14.3 g/dL (ref 12.0–15.0)
Potassium: 3.7 mmol/L (ref 3.5–5.1)
Sodium: 136 mmol/L (ref 135–145)
TCO2: 19 mmol/L — ABNORMAL LOW (ref 22–32)

## 2023-10-11 LAB — LIPOPROTEIN A (LPA): Lipoprotein (a): 111.6 nmol/L — ABNORMAL HIGH (ref <75.0)

## 2023-10-11 LAB — TROPONIN I (HIGH SENSITIVITY): Troponin I (High Sensitivity): 1283 ng/L (ref <18)

## 2023-10-11 MED ORDER — FUROSEMIDE 20 MG PO TABS
20.0000 mg | ORAL_TABLET | Freq: Every day | ORAL | 0 refills | Status: AC | PRN
  Filled 2023-10-11: qty 30, 30d supply, fill #0

## 2023-10-11 MED ORDER — LOSARTAN POTASSIUM 25 MG PO TABS
12.5000 mg | ORAL_TABLET | Freq: Every day | ORAL | 1 refills | Status: DC
  Filled 2023-10-11: qty 45, 90d supply, fill #0

## 2023-10-11 MED ORDER — EZETIMIBE 10 MG PO TABS
10.0000 mg | ORAL_TABLET | Freq: Every day | ORAL | 3 refills | Status: DC
  Filled 2023-10-11: qty 90, 90d supply, fill #0

## 2023-10-11 MED ORDER — NICOTINE 21 MG/24HR TD PT24
21.0000 mg | MEDICATED_PATCH | Freq: Every day | TRANSDERMAL | 0 refills | Status: AC
  Filled 2023-10-11: qty 28, 28d supply, fill #0

## 2023-10-11 MED ORDER — SPIRONOLACTONE 25 MG PO TABS
25.0000 mg | ORAL_TABLET | Freq: Every day | ORAL | 1 refills | Status: DC
  Filled 2023-10-11: qty 90, 90d supply, fill #0

## 2023-10-11 MED ORDER — TICAGRELOR 90 MG PO TABS
90.0000 mg | ORAL_TABLET | Freq: Two times a day (BID) | ORAL | 3 refills | Status: DC
  Filled 2023-10-11: qty 180, 90d supply, fill #0

## 2023-10-11 MED ORDER — ASPIRIN 81 MG PO TBEC
81.0000 mg | DELAYED_RELEASE_TABLET | Freq: Every day | ORAL | 3 refills | Status: DC
  Filled 2023-10-11: qty 90, 90d supply, fill #0

## 2023-10-11 NOTE — TOC Initial Note (Addendum)
 Transition of Care [facility]) - Initial/Assessment Note    Patient Details  Name: [name] MRN: [id number] Date of Birth: 22-Jun-1971  [facility] CM/SW Contact:    Nicanor Bake Phone Number: [phone] 10/11/2023, 3:08 PM  Clinical Narrative:  HF CSW met with patient, daughter, and son at bedside. Patient stated that she lives alone. Patient stated that she does not have a history of HH services. Patient stated that she has a rolling walker and oxygen at home. Patient stated that still drives. Patient stated that she does not work, and has not been able to do so for almost a year due to her medical conditions. Patient inquired with CSW about medicaid and disability. CSW discussed resource options with patient and patient declined resource information. Patient also inquired about [facility] services. HF CSW stated that she will have HF NCM follow up. Patient agreed to be screened by the Financial Counseling team. Patient stated that she already has a PCP appointment scheduled for October 15, 2023 at 2:45 PM. Patient stated that she drove to the hospital and will transport herself home at dc.   TOC will continue following.                  Expected Discharge Plan: Home/Self Care Barriers to Discharge: Continued Medical Work up   Patient Goals and CMS Choice Patient states their goals for this hospitalization and ongoing recovery are:: get healthy          Expected Discharge Plan and Services       Living arrangements for the past 2 months: Apartment Expected Discharge Date: 10/11/23                                    Prior Living Arrangements/Services Living arrangements for the past 2 months: Apartment Lives with:: Self Patient language and need for interpreter reviewed:: Yes Do you feel safe going back to the place where you live?: Yes      Need for Family Participation in Patient Care: Yes (Comment) Care giver support system in place?: No  (comment)   Criminal Activity/Legal Involvement Pertinent to Current Situation/Hospitalization: Yes - Comment as needed  Activities of Daily Living   ADL Screening (condition at time of admission) Independently performs ADLs?: Yes (appropriate for developmental age) Is the patient deaf or have difficulty hearing?: No Does the patient have difficulty seeing, even when wearing glasses/contacts?: No Does the patient have difficulty concentrating, remembering, or making decisions?: No  Permission Sought/Granted                  Emotional Assessment Appearance:: Appears stated age Attitude/Demeanor/Rapport: Engaged Affect (typically observed): Appropriate Orientation: : Oriented to Self, Oriented to Place, Oriented to  Time, Oriented to Situation Alcohol / Substance Use: Not Applicable Psych Involvement: No (comment)  Admission diagnosis:  Acute ST elevation myocardial infarction (STEMI) (HCC) [I21.3] STEMI (ST elevation myocardial infarction) (HCC) [I21.3] Patient Active Problem List   Diagnosis Date Noted   Heart failure with mildly reduced ejection fraction (HFmrEF, 41-49%) (HCC) 10/11/2023   Tobacco use 10/11/2023   Hyperlipidemia 10/11/2023   Acute ST elevation myocardial infarction (STEMI) (HCC) 10/09/2023   STEMI (ST elevation myocardial infarction) (HCC) 10/09/2023   Left upper quadrant abdominal pain 01/26/2023   S/P CABG x 4 10/08/2022   Cardiomyopathy (HCC) 09/07/2022   Abnormal cardiac CT angiography 09/07/2022   S/P lumbar fusion 06/28/2020  Chronic back pain 06/25/2020   Polyarthralgia 01/05/2013   Myofascial pain dysfunction syndrome 08/28/2011   PCP:  Charlott Rakes, MD Pharmacy:   [id number] [place], [facility] - [address] [place] [address] Phone: [phone] Fax: [phone]  [id number] [place], [place] - [address] [place] [address] Phone: [phone] Fax:  [phone]  [facility] [address] [place] [address] Phone: [phone] Fax: [phone]     Social Drivers of Health (SDOH) Social History: SDOH Screenings   Food Insecurity: No Food Insecurity (10/09/2023)  Housing: High Risk (10/10/2023)  Transportation Needs: No Transportation Needs (10/10/2023)  Utilities: Not At Risk (10/10/2023)  Depression (PHQ2-9): Medium Risk (03/16/2023)  Financial Resource Strain: Low Risk  (10/02/2023)   [facility] from [facility]  Social Connections: Unknown (03/25/2023)   Received from [facility]  Tobacco Use: High Risk (10/09/2023)   SDOH Interventions:     Readmission Risk Interventions     No data to display

## 2023-10-11 NOTE — Progress Notes (Signed)
 CARDIAC REHAB PHASE I    Pt resting in bed, with complaints of "bad headache". Pt declined walk in hallway at this time. Pt reports mobility limited to bathroom only. Has had on and off again chest pain overnight and this am. Post MI/stent education including restrictions, risk factors, exercise guidelines, antiplatelet therapy importance, MI booklet, NTG use, heart healthy diet, smoking cessation and CRP2 reviewed. All questions and concerns addressed. Will refer to [facility] for CRP2. Will continue to follow.   [id number] Woodroe Chen, RN BSN 10/11/2023 10:29 AM

## 2023-10-11 NOTE — Telephone Encounter (Signed)
 Pharmacy Patient Advocate Encounter  Insurance verification completed.    The patient is insured through [facility]. Patient has [employer], may use a copay card, and/or apply for patient assistance if available.    Ran test claim for FARXIGA 10MG  and the current 30 day co-pay is 30.00.  Ran test claim for JARDIANCE 25MG  and the current 30 day co-pay is 30.00.  This test claim was processed through [facility]- copay amounts may vary at other pharmacies due to pharmacy/plan contracts, or as the patient moves through the different stages of their insurance plan.

## 2023-10-11 NOTE — Plan of Care (Signed)

## 2023-10-11 NOTE — Progress Notes (Addendum)
 Rounding Note    Patient Name: [name] Date of Encounter: 10/11/2023  [facility] Cardiologist: Verne Carrow, MD   Subjective   This am, reports intermittent CP that started last night at rest, 4/10, not relieved or worst with sitting up. Left side CP described as dull. Substernal CP described as burning like acid reflux. Left side CP described as electrical that radiates to right side. These CP are not as severe as initial presentation CP. Associated with diaphoresis. Denies any SOB.  Also, reports ongoing HA.   Inpatient Medications    Scheduled Meds:  amphetamine-dextroamphetamine  60 mg Oral Daily   aspirin EC  81 mg Oral Daily   atorvastatin  80 mg Oral Daily   Chlorhexidine Gluconate Cloth  6 each Topical Daily   Chlorhexidine Gluconate Cloth  6 each Topical Daily   cyanocobalamin  1,000 mcg Oral Daily   ezetimibe  10 mg Oral Daily   losartan  12.5 mg Oral Daily   metoprolol succinate  50 mg Oral BID   nicotine  21 mg Transdermal Daily   sodium chloride flush  3 mL Intravenous Q12H   spironolactone  25 mg Oral Daily   ticagrelor  90 mg Oral BID   zolpidem  10 mg Oral QHS   Continuous Infusions:  PRN Meds: acetaminophen, albuterol, escitalopram, gabapentin, nitroGLYCERIN, ondansetron (ZOFRAN) IV, mouth rinse, oxyCODONE, sodium chloride flush   Vital Signs    Vitals:   10/10/23 1725 10/10/23 1949 10/11/23 0012 10/11/23 0812  BP: 119/72 113/83 114/78 96/68  Pulse: (!) 105 (!) 109 97 98  Resp: 18 20 19    Temp: 98.2 F (36.8 C) 98.1 F (36.7 C) 98.4 F (36.9 C)   TempSrc: Oral Oral Oral   SpO2: 97% 98% 92%   Weight:      Height:        Intake/Output Summary (Last 24 hours) at 10/11/2023 0924 Last data filed at 10/10/2023 1700 Gross per 24 hour  Intake 777.86 ml  Output 700 ml  Net 77.86 ml      10/10/2023    3:45 AM 10/09/2023    5:27 PM 05/26/2023    9:31 AM  Last 3 Weights  Weight (lbs) 182 lb 15.7 oz 179 lb 14.3 oz 180  lb  Weight (kg) 83 kg 81.6 kg 81.647 kg      Telemetry    Borderline sinus tachycardia, HR 97 - Personally Reviewed  ECG    Sinus tachycardia, HR 108  - Personally Reviewed  Physical Exam   GEN: Laying in bed with No acute distress.   Neck: No JVD Cardiac: RRR, no murmurs, rubs, or gallops.  Respiratory: Clear to auscultation bilaterally. GI: Soft, nontender, non-distended  MS: No edema; No deformity. Access site w/o any hematoma or abnormality.  Neuro:  Nonfocal  Psych: Normal affect   Labs    High Sensitivity Troponin:   Recent Labs  Lab 10/09/23 1734 10/09/23 2008  TROPONINIHS 526* 3,058*     Chemistry Recent Labs  Lab 10/09/23 1810 10/09/23 1814 10/10/23 0345  NA 134* 136 137  K 3.6 3.7 3.6  CL 104 103 104  CO2 19*  --  24  GLUCOSE 136* 133* 111*  BUN 10 10 12   CREATININE 0.74 0.60 0.72  CALCIUM 9.4  --  9.4  PROT 5.8*  --   --   ALBUMIN 3.4*  --   --   AST 23  --   --   ALT 23  --   --  ALKPHOS 66  --   --   BILITOT 0.5  --   --   GFRNONAA >60  --  >60  ANIONGAP 11  --  9    Lipids  Recent Labs  Lab 10/10/23 0345  CHOL 180  TRIG 343*  HDL 42  LDLCALC 69  CHOLHDL 4.3    Hematology Recent Labs  Lab 10/09/23 1734 10/09/23 1814 10/10/23 0345  WBC 9.1  --  8.0  RBC 4.88  --  4.80  HGB 15.4* 14.3 15.1*  HCT 47.9* 42.0 45.6  MCV 98.2  --  95.0  MCH 31.6  --  31.5  MCHC 32.2  --  33.1  RDW 13.2  --  13.2  PLT 348  --  361   Thyroid No results for input(s): "TSH", "FREET4" in the last 168 hours.  BNPNo results for input(s): "BNP", "PROBNP" in the last 168 hours.  DDimer No results for input(s): "DDIMER" in the last 168 hours.   Radiology    ECHOCARDIOGRAM COMPLETE Result Date: 10/10/2023    ECHOCARDIOGRAM REPORT   Patient Name:   [name] Date of Exam: 10/10/2023 Medical Rec #:  [id number]         Height:       62.0 in Accession #:    [id number]        Weight:       183.0 lb Date of Birth:  Jun 15, 1971         BSA:           1.841 m Patient Age:    53 years          BP:           115/79 mmHg Patient Gender: F                 HR:           101 bpm. Exam Location:  Inpatient Procedure: 2D Echo, Cardiac Doppler, Color Doppler and Intracardiac            Opacification Agent (Both Spectral and Color Flow Doppler were            utilized during procedure). Indications:    Acute ischemic heart disease, unspecified I24.9  History:        Patient has prior history of Echocardiogram examinations, most                 recent 12/18/2022. Acute MI and CAD, Prior CABG; Risk                 Factors:Current Smoker.  Sonographer:    Darlys Gales Referring Phys: [id number] ROBIN FERNANDES IMPRESSIONS  1. Left ventricular ejection fraction, by estimation, is 40 to 45%. The left ventricle has mildly decreased function. Left ventricular endocardial border not optimally defined to evaluate regional wall motion. Left ventricular diastolic parameters are indeterminate.  2. Right ventricular systolic function is normal. The right ventricular size is normal.  3. Left atrial size was severely dilated.  4. The mitral valve is normal in structure. No evidence of mitral valve regurgitation. No evidence of mitral stenosis.  5. The aortic valve is tricuspid. Aortic valve regurgitation is not visualized. No aortic stenosis is present.  6. The inferior vena cava is normal in size with <50% respiratory variability, suggesting right atrial pressure of 8 mmHg. Comparison(s): Prior images reviewed side by side. This is the most technically difficult quality in series from 07/20/2022; off-axis imaging. FINDINGS  Left Ventricle:  Left ventricular ejection fraction, by estimation, is 40 to 45%. The left ventricle has mildly decreased function. Left ventricular endocardial border not optimally defined to evaluate regional wall motion. Strain was performed and the global longitudinal strain is indeterminate. The left ventricular internal cavity size was normal in size. There is no left  ventricular hypertrophy. Left ventricular diastolic parameters are indeterminate.  LV Wall Scoring: The anterior septum is hypokinetic. Right Ventricle: The right ventricular size is normal. No increase in right ventricular wall thickness. Right ventricular systolic function is normal. Left Atrium: Left atrial size was severely dilated. Right Atrium: Right atrial size was not well visualized. Pericardium: There is no evidence of pericardial effusion. Mitral Valve: The mitral valve is normal in structure. No evidence of mitral valve regurgitation. No evidence of mitral valve stenosis. Tricuspid Valve: The tricuspid valve is normal in structure. Tricuspid valve regurgitation is not demonstrated. Aortic Valve: The aortic valve is tricuspid. Aortic valve regurgitation is not visualized. No aortic stenosis is present. Aortic valve peak gradient measures 4.5 mmHg. Pulmonic Valve: The pulmonic valve was normal in structure. Pulmonic valve regurgitation is not visualized. Aorta: The aortic root, ascending aorta and aortic arch are all structurally normal, with no evidence of dilitation or obstruction. Venous: The inferior vena cava is normal in size with less than 50% respiratory variability, suggesting right atrial pressure of 8 mmHg. IAS/Shunts: The interatrial septum was not well visualized. Additional Comments: 3D was performed not requiring image post processing on an independent workstation and was indeterminate.  LEFT VENTRICLE PLAX 2D LVIDd:         4.50 cm LVIDs:         3.80 cm LV PW:         1.20 cm LV IVS:        1.00 cm LVOT diam:     1.80 cm LV SV:         35 LV SV Index:   19 LVOT Area:     2.54 cm  LEFT ATRIUM           Index LA Vol (A4C): 87.7 ml 47.64 ml/m  AORTIC VALVE AV Area (Vmax): 2.07 cm AV Vmax:        106.00 cm/s AV Peak Grad:   4.5 mmHg LVOT Vmax:      86.20 cm/s LVOT Vmean:     60.700 cm/s LVOT VTI:       0.137 m  AORTA Ao Root diam: 2.60 cm MITRAL VALVE MV Area (PHT): 5.50 cm    SHUNTS MV  Decel Time: 138 msec    Systemic VTI:  0.14 m MV E velocity: 64.50 cm/s  Systemic Diam: 1.80 cm Riley Lam MD Electronically signed by Riley Lam MD Signature Date/Time: 10/10/2023/1:13:14 PM    Final    CARDIAC CATHETERIZATION Addendum Date: 10/09/2023   Prox RCA to Mid RCA lesion is 95% stenosed.   Ost Cx to Prox Cx lesion is 100% stenosed.   Mid LAD lesion is 100% stenosed.   A stent was successfully placed.   Post intervention, there is a 0% residual stenosis. 1.  Balloon assisted tracking required for right radial approach. 2.  High-grade native mid right coronary artery lesion treated with 1 drug-eluting stent. 3.  Patent vein graft to diagonal and vein graft to obtuse marginal. 4.  Severe left-sided native vessel disease. 5.  Angiography of the LIMA graft was deferred given a right radial approach; competitive flow in the native distal LAD is seen. 6.  Preserved LV function on ventriculography with LVEDP of 20 mmHg; 10 mg of Lasix IV x 1 was administered. Recommendation: Dual antiplatelet therapy with Brilinta for preferably 1 year.  Given elevated but downtrending lactates will continue to trend lactates.  Will hold on beta-blocker until lactates have normalized.  Obtain echocardiogram.  Given lack of total occlusion and radial approach, could be candidate for early discharge pathway depending on lactate trend and hospital course over the next 24 hours.  Result Date: 10/09/2023   Prox RCA to Mid RCA lesion is 95% stenosed.   Ost Cx to Prox Cx lesion is 100% stenosed.   Mid LAD lesion is 100% stenosed.   A stent was successfully placed.   Post intervention, there is a 0% residual stenosis. 1.  Balloon assisted tracking required for right radial approach. 2.  High-grade native mid right coronary artery lesion treated with 1 drug-eluting stent. 3.  Patent vein graft to diagonal and vein graft to obtuse marginal. 4.  Severe left-sided native vessel disease. 5.  Angiography of the LIMA  graft was deferred given a right radial approach; competitive flow in the native distal LAD is seen. 6.  Preserved LV function on ventriculography with LVEDP of 20 mmHg; 10 mg of Lasix IV x 1 was administered. Recommendation: Dual antiplatelet therapy with Brilinta for preferably 1 year.  Given elevated but downtrending lactates will continue to trend lactates.  Will hold on beta-blocker until lactates have normalized.  Obtain echocardiogram.    Cardiac Studies   ECHO IMPRESSIONS 10/10/23   1. Left ventricular ejection fraction, by estimation, is 40 to 45%. The  left ventricle has mildly decreased function. Left ventricular endocardial  border not optimally defined to evaluate regional wall motion. Left  ventricular diastolic parameters are  indeterminate.   2. Right ventricular systolic function is normal. The right ventricular  size is normal.   3. Left atrial size was severely dilated.   4. The mitral valve is normal in structure. No evidence of mitral valve  regurgitation. No evidence of mitral stenosis.   5. The aortic valve is tricuspid. Aortic valve regurgitation is not  visualized. No aortic stenosis is present.   6. The inferior vena cava is normal in size with <50% respiratory  variability, suggesting right atrial pressure of 8 mmHg.   Comparison(s): Prior images reviewed side by side. This is the most  technically difficult quality in series from 07/20/2022; off-axis imaging.   ECHO 12/2022 IMPRESSIONS   1. Left ventricular ejection fraction, by estimation, is 55 to 60%. Left  ventricular ejection fraction by PLAX is 59 %. The left ventricle has  normal function. The left ventricle demonstrates regional wall motion  abnormalities (see scoring  diagram/findings for description). There is mild left ventricular  hypertrophy. Left ventricular diastolic parameters are consistent with  Grade I diastolic dysfunction (impaired relaxation). There is moderate  hypokinesis of the left  ventricular, basal-mid   septal wall.   2. Right ventricular systolic function is moderately reduced. The right  ventricular size is normal. Tricuspid regurgitation signal is inadequate  for assessing PA pressure.   3. The mitral valve is abnormal. Trivial mitral valve regurgitation.   4. The aortic valve is tricuspid. Aortic valve regurgitation is not  visualized. Aortic valve sclerosis is present, with no evidence of aortic  valve stenosis.   5. Aortic dilatation noted. There is borderline dilatation of the  ascending aorta, measuring 37 mm.   6. The inferior vena cava is normal in size  with greater than 50%  respiratory variability, suggesting right atrial pressure of 3 mmHg.   7. Cannot exclude a small PFO.     LHC:08/2022 Conclusions: Multivessel coronary artery disease, as detailed below.  Most significant disease involves the mid LAD, where sequential 90% and 70% lesions flank an aneurysmal segment where a large D2 branch arises from.  The LAD lesions are highly significant by RFR (RFR 0.46).  D2 also contains a 70% proximal stenosis.  LCx and RCA demonstrate non-obstructive coronary artery disease (RFR mid RCA 0.94). Upper normal to mildly elevated left heart filling pressures (LVEDP 17 mmHg, PCWP 13 mmHg). Normal right heart and pulmonary artery pressures (mean RA 6 mmHg, mean PAP 18 mmHg). Mildly reduced Fick cardiac output/index (CO 4.0 L/min, CI 2.2 L/min/m^2).   Recommendations: Given complex anatomy of LAD/D2 disease, recommend cardiac surgery consultation and [facility] discussion regarding optimal management strategy. Discontinue diltiazem and start metoprolol succinate 25 mg daily in the setting of cardiomyophathy. Restart apixaban tomorrow morning if no evidence of bleeding/vascular complications at catheterization sites. Aggressive secondary prevention of coronary artery disease.  Consider escalation of statin therapy to high-intensity dosing at follow-up.  Patient  Profile     53 y.o. female with a history of CAD s/p CABG x3 (LIMA-LAD, SVG-Diag, SVG-OM1) in 09/2022, ischemic cardiomyopathy with EF of 35-40% on Echo in 07/2022 but improved to 55-60% on repeat Echo in 12/2022, hyperlipidemia, GERD, rheumatoid arthritis, fibromyalgia, and anxiety and tobacco use dx who is being seen 10/09/2023 for the evaluation of chest pain/inferior STEMI.   Assessment & Plan    Inferior STEMI: History of CABG x3 in 09/2022 (LIMA-LAD, SVG-Diag, SVG-OM1), known RCA disease but FFR negative at the time.  - Cath 10/09/23: LAD 100%. Lcx 100%. RCA 95 % treated with DES and 0% residual.  - Echo this admission: EF ~40%, mildly decreased LV function, RWMA not optimally defined, LA severely dilated,  - Continue ticagrelor 90 mg, aspirin 81 mg, Atorvastatin 80mg  daily, zetia 10mg  daily - This am, reports intermittent CP that started last night at rest, 4/10, not relieved or worst with sitting up. Left side CP described as dull. Substernal CP described as burning like acid reflux. Left side CP described as electrical that radiates to right side. These CP are not as severe as initial presentation CP. Associated with diaphoresis. Denies any SOB.  Also, reports ongoing HA.  - Considered Imdur but will avoid in the setting of headache - Ordered 12 lead EKG.  - Awaiting Cardiac rehab  - Discussed importance for medication compliance and avoiding heavy lifting x 1 week.  - Will schedule for oupatient follow up   HFrEF: Prior reduced EF 35-40% in 07/2022 and 55-60% in 12/2022, now ~40 - 45% - Continue spironolactone 25mg , losartan 12.5mg  daily, metoprolol 50 mg BID  - Euvolemic, no need for diuretics   HLD:  - treated as above with STEMI - Lipid clinic referral      For questions or updates, please contact [facility] Please consult [email or web address] for contact info under        Signed, Basilio Cairo, PA-C  10/11/2023, 9:24 AM

## 2023-10-11 NOTE — Discharge Summary (Addendum)
 Discharge Summary    Patient ID: [name] MRN: [id number]; DOB: 25-Jan-1971  Admit date: 10/09/2023 Discharge date: 10/11/2023  PCP:  Charlott Rakes, MD   [facility] Providers Cardiologist:  Verne Carrow, MD   {  Discharge Diagnoses    Principal Problem:   Acute ST elevation myocardial infarction (STEMI) [facility]) Active Problems:   STEMI (ST elevation myocardial infarction) (HCC)   Heart failure with mildly reduced ejection fraction (HFmrEF, 41-49%) (HCC)   Tobacco use   Hyperlipidemia    Diagnostic Studies/Procedures   LHC 10/09/2023   Prox RCA to Mid RCA lesion is 95% stenosed.   Ost Cx to Prox Cx lesion is 100% stenosed.   Mid LAD lesion is 100% stenosed.   A stent was successfully placed -Synergy Xd 3.50x16 .   Post intervention, there is a 0% residual stenosis.   Intervention     1.  Balloon assisted tracking required for right radial approach. 2.  High-grade native mid right coronary artery lesion treated with 1 drug-eluting stent. 3.  Patent vein graft to diagonal and vein graft to obtuse marginal. 4.  Severe left-sided native vessel disease. 5.  Angiography of the LIMA graft was deferred given a right radial approach; competitive flow in the native distal LAD is seen. 6.  Preserved LV function on ventriculography with LVEDP of 20 mmHg; 10 mg of Lasix IV x 1 was administered.   Recommendation: Dual antiplatelet therapy with Brilinta for preferably 1 year.  Given elevated but downtrending lactates will continue to trend lactates.  Will hold on beta-blocker until lactates have normalized.  Obtain echocardiogram.  Given lack of total occlusion and radial approach, could be candidate for early discharge pathway depending on lactate trend and hospital course over the next 24 hours.  ECHO IMPRESSIONS 10/10/23   1. Left ventricular ejection fraction, by estimation, is 40 to 45%. The  left ventricle has mildly decreased function. Left  ventricular endocardial  border not optimally defined to evaluate regional wall motion. Left  ventricular diastolic parameters are indeterminate.   2. Right ventricular systolic function is normal. The right ventricular  size is normal.   3. Left atrial size was severely dilated.   4. The mitral valve is normal in structure. No evidence of mitral valve  regurgitation. No evidence of mitral stenosis.   5. The aortic valve is tricuspid. Aortic valve regurgitation is not  visualized. No aortic stenosis is present.   6. The inferior vena cava is normal in size with <50% respiratory  variability, suggesting right atrial pressure of 8 mmHg.   Comparison(s): Prior images reviewed side by side. This is the most  technically difficult quality in series from 07/20/2022; off-axis imaging.  _____________   History of Present Illness    [name] is a 53 y.o. female with a history of CAD s/p CABG x3 (LIMA-LAD, SVG-Diag, SVG-OM1) in 09/2022, ischemic cardiomyopathy with EF of 35-40% on Echo in 07/2022 but improved to 55-60% on repeat Echo in 12/2022, palpitations with short runs of both NSVT and SVT as well as rare PACs/ PVCs noted on monitor in 07/2022, hyperlipidemia, GERD, rheumatoid arthritis, fibromyalgia, and anxiety  and tobacco use dx who is being seen 10/09/2023 for the evaluation of chest pain/inferior STEMI.   Pt presented to ED 10/09/23 with chest pain that occurred at rest while at a [employer] and radiating to the left arm, neck, EMS EKG showed inferior ST elevations with reciprocal changes and then subsided immediately with  resolution of ST elevations. She was given full dose aspirin and brought to the ER. NTG was not given due to STE.  Pt reports 1 week ago was hospitalized for URTI but recovered. She continues to smoke.  ER eval- sinus tachycardia, no ST elevations, chest pain free. She was transported to cath lab urgently for [facility] and possible graft angiography +/- PCI.  Hospital  Course     Consultants: None   Inferior STEMI: History of CABG x3 in 09/2022 (LIMA-LAD, SVG-Diag, SVG-OM1), known RCA disease but FFR negative at the time.  - Cath 10/09/23: LAD 100%. Lcx 100%. RCA 95 % treated with DES and 0% residual.  - Echo this admission: EF ~40%, mildly decreased LV function, RWMA not optimally defined, LA severely dilated, anterior septum hypokinetic  - Continue ticagrelor 90 mg, aspirin 81 mg, Toprol XL 50 mg BID, Atorvastatin 80mg , Zetia 10mg  - Patient had intermittent CP during hospitalization but seems to be atypical  - Repeat 12 lead EKG: NSR, HR 90's, nonspecific ST-T changes   - Repeat hsTN is trending down today: 1,283 (vs 3058 on 10/09/23) - Seen by Cardiac Rehab  - Discussed importance for medication compliance and avoiding heavy lifting x 1 week.  - Scheduled for follow up 10/19/2023 with Jari Favre,  PA-C   HFmrEF: Prior reduced EF 35-40% in 07/2022 and 55-60% in 12/2022, this admission ~40 - 45% - Continue spironolactone 25mg , losartan 12.5mg  daily, metoprolol 50 mg BID - Pharmacy discussed Marcelline Deist with patient but not interested in making any current changes to meds now. Can reconsider in outpatient.  - Euvolemic, no need for diuretics   HLD:  - LDL 09/2023: 69, goal of <55 - treated as above with STEMI - Lipid clinic referral  Tobacco Use - Counseled about cessation      Did the patient have an acute coronary syndrome (MI, NSTEMI, STEMI, etc) this admission?:  Yes                               AHA/ACC ACS Clinical Performance & Quality Measures: Aspirin prescribed? - Yes ADP Receptor Inhibitor (Plavix/Clopidogrel, Brilinta/Ticagrelor or Effient/Prasugrel) prescribed (includes medically managed patients)? - Yes Beta Blocker prescribed? - Yes High Intensity Statin (Lipitor 40-80mg  or Crestor 20-40mg ) prescribed? - Yes EF assessed during THIS hospitalization? - Yes For EF <40%, was ACEI/ARB prescribed? - Yes For EF <40%, Aldosterone Antagonist  (Spironolactone or Eplerenone) prescribed? - Yes Cardiac Rehab Phase II ordered (including medically managed patients)? - Yes    The patient will be scheduled for a TOC follow up appointment in 7-10 days.  A message has been sent to the [facility] and Scheduling Pool at the office where the patient should be seen for follow up.  _____________  Discharge Vitals Blood pressure 109/69, pulse 89, temperature 98.8 F (37.1 C), temperature source Oral, resp. rate 20, height 5\' 2"  (1.575 m), weight 83 kg, SpO2 100%.  Filed Weights   10/09/23 1727 10/10/23 0345  Weight: 81.6 kg 83 kg    Labs & Radiologic Studies    CBC Recent Labs    10/09/23 1734 10/09/23 1814 10/10/23 0345  WBC 9.1  --  8.0  NEUTROABS 5.9  --   --   HGB 15.4* 14.3 15.1*  HCT 47.9* 42.0 45.6  MCV 98.2  --  95.0  PLT 348  --  361   Basic Metabolic Panel Recent Labs    63/84/53 1810 10/09/23 1814  10/10/23 0345  NA 134* 136 137  K 3.6 3.7 3.6  CL 104 103 104  CO2 19*  --  24  GLUCOSE 136* 133* 111*  BUN 10 10 12   CREATININE 0.74 0.60 0.72  CALCIUM 9.4  --  9.4   Liver Function Tests Recent Labs    10/09/23 1810  AST 23  ALT 23  ALKPHOS 66  BILITOT 0.5  PROT 5.8*  ALBUMIN 3.4*   No results for input(s): "LIPASE", "AMYLASE" in the last 72 hours. High Sensitivity Troponin:   Recent Labs  Lab 10/09/23 1734 10/09/23 2008 10/11/23 1130  TROPONINIHS 526* 3,058* 1,283*    BNP Invalid input(s): "POCBNP" D-Dimer No results for input(s): "DDIMER" in the last 72 hours. Hemoglobin A1C No results for input(s): "HGBA1C" in the last 72 hours. Fasting Lipid Panel Recent Labs    10/10/23 0345  CHOL 180  HDL 42  LDLCALC 69  TRIG 343*  CHOLHDL 4.3   Thyroid Function Tests No results for input(s): "TSH", "T4TOTAL", "T3FREE", "THYROIDAB" in the last 72 hours.  Invalid input(s): "FREET3" _____________  ECHOCARDIOGRAM COMPLETE Result Date: 10/10/2023    ECHOCARDIOGRAM REPORT   Patient Name:    [name] Date of Exam: 10/10/2023 Medical Rec #:  [id number]         Height:       62.0 in Accession #:    [id number]        Weight:       183.0 lb Date of Birth:  [birth year]         BSA:          1.841 m Patient Age:    52 years          BP:           115/79 mmHg Patient Gender: F                 HR:           101 bpm. Exam Location:  Inpatient Procedure: 2D Echo, Cardiac Doppler, Color Doppler and Intracardiac            Opacification Agent (Both Spectral and Color Flow Doppler were            utilized during procedure). Indications:    Acute ischemic heart disease, unspecified I24.9  History:        Patient has prior history of Echocardiogram examinations, most                 recent 12/18/2022. Acute MI and CAD, Prior CABG; Risk                 Factors:Current Smoker.  Sonographer:    Darlys Gales Referring Phys: [id number] ROBIN FERNANDES IMPRESSIONS  1. Left ventricular ejection fraction, by estimation, is 40 to 45%. The left ventricle has mildly decreased function. Left ventricular endocardial border not optimally defined to evaluate regional wall motion. Left ventricular diastolic parameters are indeterminate.  2. Right ventricular systolic function is normal. The right ventricular size is normal.  3. Left atrial size was severely dilated.  4. The mitral valve is normal in structure. No evidence of mitral valve regurgitation. No evidence of mitral stenosis.  5. The aortic valve is tricuspid. Aortic valve regurgitation is not visualized. No aortic stenosis is present.  6. The inferior vena cava is normal in size with <50% respiratory variability, suggesting right atrial pressure of 8 mmHg. Comparison(s): Prior images reviewed side by side. This is the  most technically difficult quality in series from 07/20/2022; off-axis imaging. FINDINGS  Left Ventricle: Left ventricular ejection fraction, by estimation, is 40 to 45%. The left ventricle has mildly decreased function. Left ventricular endocardial border not  optimally defined to evaluate regional wall motion. Strain was performed and the global longitudinal strain is indeterminate. The left ventricular internal cavity size was normal in size. There is no left ventricular hypertrophy. Left ventricular diastolic parameters are indeterminate.  LV Wall Scoring: The anterior septum is hypokinetic. Right Ventricle: The right ventricular size is normal. No increase in right ventricular wall thickness. Right ventricular systolic function is normal. Left Atrium: Left atrial size was severely dilated. Right Atrium: Right atrial size was not well visualized. Pericardium: There is no evidence of pericardial effusion. Mitral Valve: The mitral valve is normal in structure. No evidence of mitral valve regurgitation. No evidence of mitral valve stenosis. Tricuspid Valve: The tricuspid valve is normal in structure. Tricuspid valve regurgitation is not demonstrated. Aortic Valve: The aortic valve is tricuspid. Aortic valve regurgitation is not visualized. No aortic stenosis is present. Aortic valve peak gradient measures 4.5 mmHg. Pulmonic Valve: The pulmonic valve was normal in structure. Pulmonic valve regurgitation is not visualized. Aorta: The aortic root, ascending aorta and aortic arch are all structurally normal, with no evidence of dilitation or obstruction. Venous: The inferior vena cava is normal in size with less than 50% respiratory variability, suggesting right atrial pressure of 8 mmHg. IAS/Shunts: The interatrial septum was not well visualized. Additional Comments: 3D was performed not requiring image post processing on an independent workstation and was indeterminate.  LEFT VENTRICLE PLAX 2D LVIDd:         4.50 cm LVIDs:         3.80 cm LV PW:         1.20 cm LV IVS:        1.00 cm LVOT diam:     1.80 cm LV SV:         35 LV SV Index:   19 LVOT Area:     2.54 cm  LEFT ATRIUM           Index LA Vol (A4C): 87.7 ml 47.64 ml/m  AORTIC VALVE AV Area (Vmax): 2.07 cm AV Vmax:         106.00 cm/s AV Peak Grad:   4.5 mmHg LVOT Vmax:      86.20 cm/s LVOT Vmean:     60.700 cm/s LVOT VTI:       0.137 m  AORTA Ao Root diam: 2.60 cm MITRAL VALVE MV Area (PHT): 5.50 cm    SHUNTS MV Decel Time: 138 msec    Systemic VTI:  0.14 m MV E velocity: 64.50 cm/s  Systemic Diam: 1.80 cm Riley Lam MD Electronically signed by Riley Lam MD Signature Date/Time: 10/10/2023/1:13:14 PM    Final    CARDIAC CATHETERIZATION Addendum Date: 10/09/2023   Prox RCA to Mid RCA lesion is 95% stenosed.   Ost Cx to Prox Cx lesion is 100% stenosed.   Mid LAD lesion is 100% stenosed.   A stent was successfully placed.   Post intervention, there is a 0% residual stenosis. 1.  Balloon assisted tracking required for right radial approach. 2.  High-grade native mid right coronary artery lesion treated with 1 drug-eluting stent. 3.  Patent vein graft to diagonal and vein graft to obtuse marginal. 4.  Severe left-sided native vessel disease. 5.  Angiography of the LIMA graft was deferred given a  right radial approach; competitive flow in the native distal LAD is seen. 6.  Preserved LV function on ventriculography with LVEDP of 20 mmHg; 10 mg of Lasix IV x 1 was administered. Recommendation: Dual antiplatelet therapy with Brilinta for preferably 1 year.  Given elevated but downtrending lactates will continue to trend lactates.  Will hold on beta-blocker until lactates have normalized.  Obtain echocardiogram.  Given lack of total occlusion and radial approach, could be candidate for early discharge pathway depending on lactate trend and hospital course over the next 24 hours.  Result Date: 10/09/2023   Prox RCA to Mid RCA lesion is 95% stenosed.   Ost Cx to Prox Cx lesion is 100% stenosed.   Mid LAD lesion is 100% stenosed.   A stent was successfully placed.   Post intervention, there is a 0% residual stenosis. 1.  Balloon assisted tracking required for right radial approach. 2.  High-grade native mid right  coronary artery lesion treated with 1 drug-eluting stent. 3.  Patent vein graft to diagonal and vein graft to obtuse marginal. 4.  Severe left-sided native vessel disease. 5.  Angiography of the LIMA graft was deferred given a right radial approach; competitive flow in the native distal LAD is seen. 6.  Preserved LV function on ventriculography with LVEDP of 20 mmHg; 10 mg of Lasix IV x 1 was administered. Recommendation: Dual antiplatelet therapy with Brilinta for preferably 1 year.  Given elevated but downtrending lactates will continue to trend lactates.  Will hold on beta-blocker until lactates have normalized.  Obtain echocardiogram.   Disposition   Pt is being discharged home today in good condition.  Follow-up Plans & Appointments     Discharge Instructions     AMB Referral to Advanced Lipid Disorders Clinic   Complete by: As directed    Internal Lipid Clinic Referral Scheduling  Internal lipid clinic referrals are providers within [facility], who wish to refer established patients for routine management (help in starting PCSK9 inhibitor therapy) or advanced therapies.  Internal MD referral criteria:              1. All patients with LDL>190 mg/dL  2. All patients with Triglycerides >500 mg/dL  3. Patients with suspected or confirmed heterozygous familial hyperlipidemia (HeFH) or homozygous familial hyperlipidemia (HoFH)  4. Patients with family history of suspicious for genetic dyslipidemia desiring genetic testing  5. Patients refractory to standard guideline based therapy  6. Patients with statin intolerance (failed 2 statins, one of which must be a high potency statin)  7. Patients who the provider desires to be seen by MD   Internal PharmD referral criteria:   1. Follow-up patients for medication management  2. Follow-up for compliance monitoring  3. Patients for drug education  4. Patients with statin intolerance  5. PCSK9 inhibitor education and prior authorization  approvals  6. Patients with triglycerides <500 mg/dL  External Lipid Clinic Referral  External lipid clinic referrals are for providers outside of [facility], considered new clinic patients - automatically routed to MD schedule   AMB Referral to [facility] Pharm-D   Complete by: As directed    Reason For Referral: Lipids Comment - LDL 69 w/ complex CAD hx, goal <55 (added zetia this admit)   Amb Referral to Cardiac Rehabilitation   Complete by: As directed    Diagnosis:  Coronary Stents STEMI     After initial evaluation and assessments completed: Virtual Based Care may be provided alone or in conjunction with Phase 2 Cardiac  Rehab based on patient barriers.: Yes   Intensive Cardiac Rehabilitation (ICR) [facility] location only OR Traditional Cardiac Rehabilitation (TCR) *If criteria for ICR are not met will enroll in TCR [facility] only): Yes   Diet - low sodium heart healthy   Complete by: As directed    Discharge instructions   Complete by: As directed    Radial Site Care Refer to this sheet in the next few weeks. These instructions provide you with information on caring for yourself after your procedure. Your caregiver may also give you more specific instructions. Your treatment has been planned according to current medical practices, but problems sometimes occur. Call your caregiver if you have any problems or questions after your procedure. HOME CARE INSTRUCTIONS You may shower the day after the procedure. Remove the bandage (dressing) and gently wash the site with plain soap and water. Gently pat the site dry.  Do not apply powder or lotion to the site.  Do not submerge the affected site in water for 3 to 5 days.  Inspect the site at least twice daily.  Do not flex or bend the affected arm for 24 hours.  No lifting over 5 pounds (2.3 kg) for 5 days after your procedure.  Do not drive home if you are discharged the same day of the procedure. Have someone else drive you.  You may drive 24  hours after the procedure unless otherwise instructed by your caregiver.  What to expect: Any bruising will usually fade within 1 to 2 weeks.  Blood that collects in the tissue (hematoma) may be painful to the touch. It should usually decrease in size and tenderness within 1 to 2 weeks.  SEEK IMMEDIATE MEDICAL CARE IF: You have unusual pain at the radial site.  You have redness, warmth, swelling, or pain at the radial site.  You have drainage (other than a small amount of blood on the dressing).  You have chills.  You have a fever or persistent symptoms for more than 72 hours.  You have a fever and your symptoms suddenly get worse.  Your arm becomes pale, cool, tingly, or numb.  You have heavy bleeding from the site. Hold pressure on the site.    PLEASE DO NOT MISS ANY DOSES OF YOUR BRILINTA!!!!! Also keep a log of you blood pressures and bring back to your follow up appt. Please call the office with any questions.   Patients taking blood thinners should generally stay away from medicines like ibuprofen, Advil, Motrin, naproxen, and Aleve due to risk of stomach bleeding. You may take Tylenol as directed or talk to your primary doctor about alternatives.  Some studies suggest Prilosec/Omeprazole interacts with Plavix. We changed your Prilosec/Omeprazole to the equivalent dose of Protonix for less chance of interaction.  PLEASE ENSURE THAT YOU DO NOT RUN OUT OF YOUR BRILINTA. This medication is very important to remain on for at least one year. IF you have issues obtaining this medication due to cost please CALL the office 3-5 business days prior to running out in order to prevent missing doses of this medication.   Increase activity slowly   Complete by: As directed         Discharge Medications   Allergies as of 10/11/2023       Reactions   Zoloft [sertraline] Other (See Comments)   Altered mental state        Medication List     STOP taking these medications     aspirin-acetaminophen-caffeine 250-250-65 MG  tablet Commonly known as: EXCEDRIN MIGRAINE   BC HEADACHE POWDER PO   dexamethasone 1 MG tablet Commonly known as: DECADRON       TAKE these medications    albuterol 108 (90 Base) MCG/ACT inhaler Commonly known as: VENTOLIN HFA Inhale 2 puffs into the lungs every 4 (four) hours as needed for wheezing or shortness of breath.   amphetamine-dextroamphetamine 30 MG 24 hr capsule Commonly known as: ADDERALL XR Take 60 mg by mouth daily.   aspirin EC 81 MG tablet Take 1 tablet (81 mg total) by mouth daily. Swallow whole. Start taking on: October 12, 2023   atorvastatin 80 MG tablet Commonly known as: LIPITOR Take 1 tablet (80 mg total) by mouth daily.   Brilinta 90 MG Tabs tablet Generic drug: ticagrelor Take 1 tablet (90 mg total) by mouth 2 (two) times daily.   cyanocobalamin 1000 MCG tablet Commonly known as: VITAMIN B12 Take 1,000 mcg by mouth daily.   dicyclomine 20 MG tablet Commonly known as: BENTYL Take 20 mg by mouth daily as needed for spasms.   escitalopram 10 MG tablet Commonly known as: LEXAPRO Take 10 mg by mouth daily as needed (anxiety).   ezetimibe 10 MG tablet Commonly known as: ZETIA Take 1 tablet (10 mg total) by mouth daily. Start taking on: October 12, 2023   ferrous sulfate 325 (65 FE) MG EC tablet Take 325 mg by mouth daily as needed (for iron).   furosemide 20 MG tablet Commonly known as: LASIX Take 1 tablet (20 mg total) by mouth daily as needed (for lower extremity edema).   gabapentin 400 MG capsule Commonly known as: NEURONTIN Take 800 mg by mouth daily as needed (for pain).   losartan 25 MG tablet Commonly known as: COZAAR Take 0.5 tablets (12.5 mg total) by mouth daily. Start taking on: October 12, 2023   metoprolol succinate 50 MG 24 hr tablet Commonly known as: TOPROL-XL Take 1 tablet (50 mg total) by mouth 2 (two) times daily. Take with or immediately following a meal.   nicotine  21 mg/24hr patch Commonly known as: NICODERM CQ - dosed in mg/24 hours Place 1 patch (21 mg total) onto the skin daily.   nitroGLYCERIN 0.4 MG SL tablet Commonly known as: NITROSTAT PLACE 1 TABLET UNDER THE TONGUE EVERY 5 MINUTES AS NEEDED FOR CHEST PAIN.   omeprazole 40 MG capsule Commonly known as: PRILOSEC Take 40 mg by mouth every evening.   spironolactone 25 MG tablet Commonly known as: ALDACTONE Take 1 tablet (25 mg total) by mouth daily. Start taking on: October 12, 2023   VITAMIN D (CHOLECALCIFEROL) PO Take 1 tablet by mouth daily.   zolpidem 10 MG tablet Commonly known as: AMBIEN Take 10 mg by mouth at bedtime.        Outstanding Labs/Studies    Duration of Discharge Encounter: APP Time: 35 minutes   Signed, Basilio Cairo, PA-C 10/11/2023, 3:01 PM

## 2023-10-11 NOTE — Telephone Encounter (Signed)
 Patient Product/process development scientist completed.    The patient is insured through [employer] [facility]. Patient has [employer], may use a copay card, and/or apply for patient assistance if available.    Ran test claim for Brilinta 90 mg and the current 30 day co-pay is $30.00.   This test claim was processed through [facility]- copay amounts may vary at other pharmacies due to pharmacy/plan contracts, or as the patient moves through the different stages of their insurance plan.     Roland Earl, CPHT Pharmacy Technician III Certified Patient Advocate [facility] Patient Advocate Team Direct Number: [phone]  Fax: [phone]

## 2023-10-11 NOTE — Progress Notes (Signed)
 Mobility Specialist Progress Note:    10/11/23 1222  Mobility  Activity Ambulated independently in room  Level of Assistance Independent  Assistive Device None  Distance Ambulated (ft) 200 ft  Activity Response Tolerated well  Mobility Referral Yes  Mobility visit 1 Mobility  Mobility Specialist Start Time (ACUTE ONLY) 1212  Mobility Specialist Stop Time (ACUTE ONLY) 1220  Mobility Specialist Time Calculation (min) (ACUTE ONLY) 8 min   Pt received in bed, agreeable to mobility session. Ambulated independently in room d/t headache and c/o dizziness. Pt reported dizziness is "sporadic" and does not worsen with mobility. BP 101/60 (72). Tolerated well. Returned pt to bed, all needs met.    Feliciana Rossetti Mobility Specialist Please contact via Special educational needs teacher or  Rehab office at [phone]

## 2023-10-11 NOTE — Progress Notes (Signed)
 Reviewed AVS, patient expressed understanding of medications, MD follow up reviewed.   Removed IV, Site clean, dry and intact.  Patient states all belongings brought to the hospital at time of admission are accounted for and packed to take home.  Picked up medications from [facility] pharmacy. Pt  walked to cafe with family.

## 2023-10-12 NOTE — TOC Transition Note (Signed)
 Transition of Care [facility]) - Discharge Note   Patient Details  Name: [name] MRN: [id number] Date of Birth: [birth year]  Transition of Care [facility]) CM/SW Contact:  Elliot Cousin, RN Phone Number:[phone] 10/12/2023, 8:47 AM   Clinical Narrative:    TOC CM reviewed chart. Outpatient Cardiac Rehab referral was sent for continued rehab. Pt has RW and bedside commode at home.   Final next level of care: Home/Self Care Barriers to Discharge: No Barriers Identified   Patient Goals and CMS Choice Patient states their goals for this hospitalization and ongoing recovery are:: get healthy          Discharge Placement                       Discharge Plan and Services Additional resources added to the After Visit Summary for     Discharge Planning Services: CM Consult                                 Social Drivers of Health (SDOH) Interventions SDOH Screenings   Food Insecurity: No Food Insecurity (10/09/2023)  Housing: High Risk (10/10/2023)  Transportation Needs: No Transportation Needs (10/10/2023)  Utilities: Not At Risk (10/10/2023)  Depression (PHQ2-9): Medium Risk (03/16/2023)  Financial Resource Strain: Low Risk  (10/02/2023)   Received from [facility]  Social Connections: Unknown (03/25/2023)   Received from [facility]  Tobacco Use: High Risk (10/09/2023)     Readmission Risk Interventions     No data to display

## 2023-10-19 ENCOUNTER — Ambulatory Visit: Payer: Self-pay | Attending: Cardiology | Admitting: Physician Assistant

## 2023-10-19 ENCOUNTER — Encounter: Payer: Self-pay | Admitting: Physician Assistant

## 2023-10-19 VITALS — BP 114/82 | HR 88 | Ht 62.5 in | Wt 172.0 lb

## 2023-10-19 DIAGNOSIS — I2510 Atherosclerotic heart disease of native coronary artery without angina pectoris: Secondary | ICD-10-CM | POA: Diagnosis not present

## 2023-10-19 DIAGNOSIS — I4891 Unspecified atrial fibrillation: Secondary | ICD-10-CM

## 2023-10-19 DIAGNOSIS — I10 Essential (primary) hypertension: Secondary | ICD-10-CM

## 2023-10-19 DIAGNOSIS — I493 Ventricular premature depolarization: Secondary | ICD-10-CM | POA: Diagnosis not present

## 2023-10-19 DIAGNOSIS — R002 Palpitations: Secondary | ICD-10-CM

## 2023-10-19 DIAGNOSIS — I255 Ischemic cardiomyopathy: Secondary | ICD-10-CM

## 2023-10-19 DIAGNOSIS — I471 Supraventricular tachycardia, unspecified: Secondary | ICD-10-CM | POA: Diagnosis not present

## 2023-10-19 MED ORDER — ICOSAPENT ETHYL 1 G PO CAPS: 2.0000 g | ORAL_CAPSULE | Freq: Two times a day (BID) | ORAL | 6 refills | Status: DC

## 2023-10-19 MED ORDER — ASPIRIN 81 MG PO TBEC: 81.0000 mg | DELAYED_RELEASE_TABLET | Freq: Every day | ORAL | Status: DC

## 2023-10-19 NOTE — Patient Instructions (Signed)
 Medication Instructions:   START TAKING VASCEPA 2 GRAMS BY MOUTH TWICE DAILY  PLEASE CONTINUE TAKING ASPIRIN 81 MG BY MOUTH DAILY  *If you need a refill on your cardiac medications before your next appointment, please call your pharmacy*   Lab Work:  1.)  TODAY--DOWNSTAIRS FIRST FLOOR AT [facility]--BMET, MAGNESIUM LEVEL, AND TROPONIN  2.)  IN 8 WEEKS AT [facility] NEAR YOU--LIPIDS AND LFTs--PLEASE COME FASTING TO THIS LAB APPOINTMENT  If you have labs (blood work) drawn today and your tests are completely normal, you will receive your results only by: MyChart Message (if you have MyChart) OR A paper copy in the mail If you have any lab test that is abnormal or we need to change your treatment, we will call you to review the results.    Follow-Up:  2-3 MONTHS WITH AN EXTENDER IN THE OFFICE       1st Floor: - Lobby - Registration  - Pharmacy  - Lab - Cafe  2nd Floor: - PV Lab - Diagnostic Testing (echo, CT, nuclear med)  3rd Floor: - Vacant  4th Floor: - TCTS (cardiothoracic surgery) - AFib Clinic - Structural Heart Clinic - Vascular Surgery  - Vascular Ultrasound  5th Floor: - [facility] (general and EP) - Clinical Pharmacy for coumadin, hypertension, lipid, weight-loss medications, and med management appointments    Valet parking services will be available as well.

## 2023-10-19 NOTE — Progress Notes (Signed)
 Office Visit    Patient Name: [name] Date of Encounter: 10/19/2023  PCP:  Noberto Retort, MD   [facility]  Cardiologist:  Verne Carrow, MD  Advanced Practice Provider:  No care team member to display Electrophysiologist:  None   HPI    [name] is a 53 y.o. female with a past medical history significant for anxiety, fibromyalgia, GERD, arthritis, HLD, rheumatoid arthritis, tobacco abuse presents today for overdue follow-up appointment.  She was seen initially March 2021.  She had palpitations at home.  She felt like her heart was skipping a beat.  She also had dyspnea on exertion.  No chest pain.  She was very anxious.  She smokes 1 pack a day.  She had a normal exercise stress test March 2021.  Echo March 2021 with LVEF 50 to 55%.  No valve disease.  Cardiac monitor April 2021 with sinus rhythm, 2 short runs of VT (longest 9 beats), 3 short runs of SVT (longest 12 beats).  She is here for overdue follow-up.  She was last seen April 2021 and at that time she denied any CV symptoms.  She does have some palpitations and was having severe back pain at that time.  She was last seen 06/18/22 by myself and she told me that she had an episode of a racing heartbeat and felt so bad that she immediately reported it to a fire station.  There, they did an EKG and told her she was in atrial fibrillation and advised that she go to the ED for further evaluation.  She has had other episodes of this racing heartbeat.  She also sometimes endorses a feeling of her heart stopping and her waiting for it to restart.  She was taken off her Cardizem but she cannot remember why.  She also states that her blood pressure has been elevated especially her diastolic blood pressure.  Reports 170/100 and 150/100.  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  She does endorse insignificant  bleeding from the stones.  This has been going on for 5 years per patient report.  She was seen by me 07/24/21, She had a significant finding on her echocardiogram which revealed a reduced EF and some wall regional motion abnormality.  A coronary CTA scan will be arranged today.  She still is having some SOB. She has felt horrible from a URI/possible bronchitis. She went to her PCP's office and they did not want to start her on anything due to her Afib and the workup that we are doing. We discussed next steps will be determined after the CT scan. She can place the zio (which she already has) after her coronary CT scan. Her BP is elevated today here in the clinic and has been at home per the patient. She thought it was due to the Eliquis however, I explained that Eliquis should not affect BP. We discussed increasing her Cardizem today.  I saw her back in January 2024 and she states that she is under a lot of stress and her husband makes it worse.  She states that stress from taking care of him has started all of these health problems for her.  She is worried that she will drop dead.  She did take some magnesium and potassium and it relieved the pressure in her chest.  She is also on aspirin 325  mg.  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  She has got her husband off financially and emotionally because he is pulling her down from a health standpoint.  She is still having chest pains and a lot of shortness of breath.  Her shortness of breath is not proportional to any of the cardiac findings as far.  We reviewed her CT scan as well as her echocardiogram again.  She states that she just is running out of oxygen.  No swelling.  Weight has been stable.  She also is short of breath when eating and bending over.  She was given a large dose of metoprolol for the CT scan and it did not touch her heart rate.  She then needed to take a few nitroglycerin tabs and that lowered her heart rate.  She was seen by me 09/01/22,  she shares with me that he is very fatigued. She is limiting her activity and gets out of breath easily. Scared to get on a treadmill or do any physical activity.  We have had issues receiving her ZIO monitor x 2. Once in December we never got results and now her monitor from January. She tells me she dropped off at [employer].  There has been some miscommunication.  I have asked her to contact [employer] to see what happened.  We also discussed cardiac catheterization to determine what is causing her symptoms.  Her heart rate remains elevated today.  She remains on Cardizem for blood pressure and heart rate control.  We have started her on daily Lipitor and we will obtain an updated lipid panel.  Cardiac cath on 09/07/2022 showed sequential 90% and 70% lesions in the LAD flanking and aneurysmal segment where a large second diagonal branch arises from.  Second diagonal branch had 70% proximal stenosis.  The right coronary artery had segmental 60% concentric mid vessel stenosis.  The RFR in the mid RCA was 0.94.  RFR of the LAD was 0.46.  Left heart filling pressures were elevated with LVEDP of 17 and pulmonary capillary wedge of 13.  Right heart pressures were normal.  She underwent elective coronary bypass grafting x 4 on 10/08/2022 without complication.  She was started on nicotine patch per request.  She had significant volume excess.  She was eventually diuresed back to baseline.  A 6-minute walk test confirmed the need for supplemental oxygen at home.  Otherwise, uneventful hospitalization.  I saw her 10/2022, she tells me that she is having some nausea and vomiting.  She experienced this before surgery and that usually happens in the morning.  She stated in the hospital she was drinking some apple juice and was choking.  She had to get assistance right away.  She states that she is taking her omeprazole.  She also tells me that she has had a lot of pins and needle feeling in her leg and in her chest since surgery.  She  was given gabapentin to take and she has been requiring this every few hours.  She plans to discuss this with the surgeon on Wednesday.  She does not feel much difference at this current moment but hopes to feel better in a couple of weeks once she has recovered.  She states she has not had normal bowel movement since being home and says she does not of the gallbladder they tend to be mostly bile.  We discussed adding on a KUB since she is getting her chest x-ray today that way Dr. Laneta Simmers  can look it over when she sees him.  Reports no shortness of breath nor dyspnea on exertion. Reports no chest pain, pressure, or tightness. No edema, orthopnea, PND. Reports no palpitations.   Dr. Clifton James saw her August 2024 and at that time her blood pressure has been consistently over 140 at home.  Recent undergone CABG x 4 back in March 2024.  Heart rate was in the low 100s.  Denied chest pain and shortness of breath.  She was seen in the ER March 2025 with chest pain.  Troponin levels were trending down and EKG revealed no other significant abnormalities from baseline.  She underwent successful angioplasty and stenting of the dominant RCA which was not bypassed previously.  She was recommended to continue high intensity statins with goal of LDL less than 70 and recommended to continue DAPT for a year.  Today, she presents with a history of cardiac issues including a recent heart attack, presents with ongoing chest pain. The patient describes the pain as sharp and similar to the pain experienced during her recent heart attack. The patient also reports a history of atypical symptoms preceding cardiac events, including stomach pain and sensations similar to heartburn.  The patient has been trying to manage her anxiety, which she describes as feeling "on edge" and having difficulty calming down. She has been taking Lexapro daily and Xanax as needed, but is considering a more consistent medication regimen to manage her  anxiety levels.  The patient also reports high cholesterol and triglycerides, despite efforts to improve her diet. She has been taking Zetia for cholesterol management and has been recommended to try Vascepa, a high-dose fish oil supplement.  The patient has been experiencing difficulty in getting appropriate care for her symptoms, with challenges in getting timely appointments with specialists and confusion over her symptoms' origins. She expresses frustration over her symptoms being dismissed as gastrointestinal issues when she believes they are cardiac-related.  Reports no shortness of breath nor dyspnea on exertion.  No edema, orthopnea, PND. Reports no palpitations.   Discussed the use of AI scribe software for clinical note transcription with the patient, who gave verbal consent to proceed.  Past Medical History    Past Medical History:  Diagnosis Date   A-fib (HCC)    Anxiety    Arthralgia    Asthma    Attention-deficit hyperactivity disorder, predominantly inattentive type    Back pain    Coronary artery disease    Dysrhythmia    Afib   Fear of flying    Fibromyalgia    denies having Fibromyalgia   Gastroesophageal reflux disease    HPV in female    Joint pain    Localized edema    Neck pain    OA (osteoarthritis)    Papillomavirus as the cause of diseases classified elsewhere    Primary insomnia    Pure hypercholesterolemia    Rheumatoid arthritis, seronegative, hand, unspecified laterality (HCC)    patient feels it is back related, not a true arthritis   Spondylolisthesis of lumbosacral region    Tobacco dependence    Unspecified inflammatory spondylopathy, cervical region (HCC)    Vitamin B12 deficiency anemia    Voice hoarseness    Past Surgical History:  Procedure Laterality Date   ABDOMINAL EXPOSURE N/A 06/28/2020   Procedure: ABDOMINAL EXPOSURE;  Surgeon: Cephus Shelling, MD;  Location: [facility] OR;  Service: Vascular;  Laterality: N/A;  anterior    ANTERIOR LUMBAR FUSION N/A 06/28/2020   Procedure:  ANTERIOR LUMBAR INTERBODY FUSION LUMBAR FIVE-SACRAL ONE.;  Surgeon: Tia Alert, MD;  Location: [facility] OR;  Service: Neurosurgery;  Laterality: N/A;  anterior approach   BIOPSY  01/28/2023   Procedure: BIOPSY;  Surgeon: Lynann Bologna, DO;  Location: [facility] ENDOSCOPY;  Service: Gastroenterology;;   BREAST ENHANCEMENT SURGERY     CARDIAC CATHETERIZATION     CARPAL TUNNEL RELEASE Left    CARPAL TUNNEL RELEASE Right 06/28/2020   Procedure: CARPAL TUNNEL RELEASE;  Surgeon: Tia Alert, MD;  Location: [facility] OR;  Service: Neurosurgery;  Laterality: Right;   CERVICAL SPINE SURGERY     CHOLECYSTECTOMY     CLIPPING OF ATRIAL APPENDAGE N/A 10/08/2022   Procedure: CLIPPING OF ATRIAL APPENDAGE USING ATRICURE ATRICLIP PRO2 SIZE 35;  Surgeon: Alleen Borne, MD;  Location: [facility] OR;  Service: Open Heart Surgery;  Laterality: N/A;   CORONARY ARTERY BYPASS GRAFT N/A 10/08/2022   Procedure: CORONARY ARTERY BYPASS GRAFTING (CABG) X3 USING LEFT INTERNAL MAMMARY ARTERY AND RIGHT GREATER SAPHENOUS VEIN HARVESTED ENDOSCOPICALLY;  Surgeon: Alleen Borne, MD;  Location: [facility] OR;  Service: Open Heart Surgery;  Laterality: N/A;   CORONARY PRESSURE/FFR STUDY N/A 09/07/2022   Procedure: INTRAVASCULAR PRESSURE WIRE/FFR STUDY;  Surgeon: Yvonne Kendall, MD;  Location: [facility] INVASIVE CV LAB;  Service: Cardiovascular;  Laterality: N/A;   CORONARY STENT INTERVENTION N/A 10/09/2023   Procedure: CORONARY STENT INTERVENTION;  Surgeon: Orbie Pyo, MD;  Location: [facility]VAS[facility] CV LAB;  Service: Cardiovascular;  Laterality: N/A;   CORONARY/GRAFT ACUTE MI REVASCULARIZATION N/A 10/09/2023   Procedure: Coronary/Graft Acute MI Revascularization;  Surgeon: Orbie Pyo, MD;  Location: [facility] CV LAB;  Service: Cardiovascular;  Laterality: N/A;   ESOPHAGOGASTRODUODENOSCOPY N/A 01/28/2023   Procedure: ESOPHAGOGASTRODUODENOSCOPY (EGD);  Surgeon: Lynann Bologna, DO;  Location: [facility];  Service: Gastroenterology;  Laterality: N/A;   FOOT SURGERY     LEFT HEART CATH AND CORONARY ANGIOGRAPHY N/A 10/09/2023   Procedure: LEFT HEART CATH AND CORONARY ANGIOGRAPHY;  Surgeon: Orbie Pyo, MD;  Location: [facility]VAS[facility] CV LAB;  Service: Cardiovascular;  Laterality: N/A;   MANDIBLE SURGERY     MAZE N/A 10/08/2022   Procedure: PULMONARY VEIN ABLATION USING ATRICURE ISOLATOR SYNERGY ENCOMPASS OLH;  Surgeon: Alleen Borne, MD;  Location: [facility] OR;  Service: Open Heart Surgery;  Laterality: N/A;   RIGHT/LEFT HEART CATH AND CORONARY ANGIOGRAPHY N/A 09/07/2022   Procedure: RIGHT/LEFT HEART CATH AND CORONARY ANGIOGRAPHY;  Surgeon: Yvonne Kendall, MD;  Location: [facility] INVASIVE CV LAB;  Service: Cardiovascular;  Laterality: N/A;   TEE WITHOUT CARDIOVERSION N/A 10/08/2022   Procedure: TRANSESOPHAGEAL ECHOCARDIOGRAM;  Surgeon: Alleen Borne, MD;  Location: [facility] OR;  Service: Open Heart Surgery;  Laterality: N/A;   TONSILLECTOMY     TUBAL LIGATION     with ablation   TYMPANOSTOMY TUBE PLACEMENT      Allergies  Allergies  Allergen Reactions   Zoloft [Sertraline] Other (See Comments)    Altered mental state     EKGs/Labs/Other Studies Reviewed:   The following studies were reviewed today:  Echocardiogram 07/20/2021  IMPRESSIONS     1. Left ventricular ejection fraction, by estimation, is 35 to 40%. The  left ventricle has moderately decreased function. The left ventricle  demonstrates regional wall motion abnormalities (see scoring  diagram/findings for description). The left  ventricular internal cavity size was mildly dilated. Left ventricular  diastolic parameters are indeterminate. Mild global hypokinesis with  moderate hypokinesis of septal wall.   2. Right  ventricular systolic function is normal. The right ventricular  size is normal.   3. Left atrial size was moderately dilated.   4. The mitral valve is normal in structure. Mild to moderate mitral valve   regurgitation. No evidence of mitral stenosis.   5. The aortic valve is grossly normal. There is mild calcification of the  aortic valve. Aortic valve regurgitation is not visualized. Aortic valve  sclerosis is present, with no evidence of aortic valve stenosis.   6. The inferior vena cava is normal in size with greater than 50%  respiratory variability, suggesting right atrial pressure of 3 mmHg.   7. Cannot exclude a small PFO.   Comparison(s): Prior images reviewed side by side. Changes from prior  study are noted. Prior images with normal EF, maybe minimal abnormal  septal motion. EF reduced and septal motion worse compared to prior.   Conclusion(s)/Recommendation(s): Significantly changed compared to prior,  will contact Jari Favre and Dr. Clifton James.   FINDINGS   Left Ventricle: Left ventricular ejection fraction, by estimation, is 35  to 40%. The left ventricle has moderately decreased function. The left  ventricle demonstrates regional wall motion abnormalities. Moderate  hypokinesis of the left ventricular,  entire anteroseptal wall and inferoseptal wall. Global longitudinal strain  performed but not reported based on interpreter judgement due to  suboptimal tracking. 3D left ventricular ejection fraction analysis  performed but not reported based on  interpreter judgement due to suboptimal tracking. The left ventricular  internal cavity size was mildly dilated. There is no left ventricular  hypertrophy. Left ventricular diastolic parameters are indeterminate.     LV Wall Scoring:  Mild global hypokinesis with moderate hypokinesis of septal wall.   Right Ventricle: The right ventricular size is normal. No increase in  right ventricular wall thickness. Right ventricular systolic function is  normal.   Left Atrium: Left atrial size was moderately dilated.   Right Atrium: Right atrial size was normal in size.   Pericardium: There is no evidence of pericardial effusion.    Mitral Valve: The mitral valve is normal in structure. There is mild  thickening of the mitral valve leaflet(s). Mild to moderate mitral valve  regurgitation. No evidence of mitral valve stenosis.   Tricuspid Valve: The tricuspid valve is normal in structure. Tricuspid  valve regurgitation is trivial. No evidence of tricuspid stenosis.   Aortic Valve: The aortic valve is grossly normal. There is mild  calcification of the aortic valve. Aortic valve regurgitation is not  visualized. Aortic valve sclerosis is present, with no evidence of aortic  valve stenosis.   Pulmonic Valve: The pulmonic valve was grossly normal. Pulmonic valve  regurgitation is trivial. No evidence of pulmonic stenosis.   Aorta: The aortic root, ascending aorta, aortic arch and descending aorta  are all structurally normal, with no evidence of dilitation or  obstruction.   Venous: The inferior vena cava is normal in size with greater than 50%  respiratory variability, suggesting right atrial pressure of 3 mmHg.   IAS/Shunts: Cannot exclude a small PFO.    Echocardiogram 09/28/19 IMPRESSIONS     1. Left ventricular ejection fraction, by estimation, is 50 to 55%. The  left ventricle has low normal function. The left ventricle has no regional  wall motion abnormalities. The left ventricular internal cavity size was  mildly dilated. Left ventricular  diastolic parameters are indeterminate. The average left ventricular  global longitudinal strain is -17.6 %.   2. Right ventricular systolic function is normal. The  right ventricular  size is normal. There is normal pulmonary artery systolic pressure.   3. The mitral valve is normal in structure. Trivial mitral valve  regurgitation. No evidence of mitral stenosis.   4. The aortic valve is tricuspid. Aortic valve regurgitation is not  visualized. No aortic stenosis is present.   5. The inferior vena cava is normal in size with greater than 50%  respiratory  variability, suggesting right atrial pressure of 3 mmHg.   FINDINGS   Left Ventricle: Left ventricular ejection fraction, by estimation, is 50  to 55%. The left ventricle has low normal function. The left ventricle has  no regional wall motion abnormalities. The average left ventricular global  longitudinal strain is -17.6  %. The left ventricular internal cavity size was mildly dilated. There is  no left ventricular hypertrophy. Left ventricular diastolic parameters are  indeterminate.   Right Ventricle: The right ventricular size is normal. No increase in  right ventricular wall thickness. Right ventricular systolic function is  normal. There is normal pulmonary artery systolic pressure. The tricuspid  regurgitant velocity is 2.03 m/s, and   with an assumed right atrial pressure of 3 mmHg, the estimated right  ventricular systolic pressure is 19.5 mmHg.   Left Atrium: Left atrial size was normal in size.   Right Atrium: Right atrial size was normal in size.   Pericardium: There is no evidence of pericardial effusion.   Mitral Valve: The mitral valve is normal in structure. Normal mobility of  the mitral valve leaflets. Trivial mitral valve regurgitation. No evidence  of mitral valve stenosis.   Tricuspid Valve: The tricuspid valve is normal in structure. Tricuspid  valve regurgitation is trivial. No evidence of tricuspid stenosis.   Aortic Valve: The aortic valve is tricuspid. Aortic valve regurgitation is  not visualized. No aortic stenosis is present.   Pulmonic Valve: The pulmonic valve was normal in structure. Pulmonic valve  regurgitation is not visualized. No evidence of pulmonic stenosis.   Aorta: The aortic root is normal in size and structure.   Venous: The inferior vena cava is normal in size with greater than 50%  respiratory variability, suggesting right atrial pressure of 3 mmHg.   IAS/Shunts: No atrial level shunt detected by color flow Doppler.   Cardiac  monitor 10/01/19  Sinus rhythm 2 episodes of ventricular tachycardia with longest at 9 beats.  Rare PVCs 3 short runs of supraventricular tachycardia, longest 12 beats  EKG:  EKG is  ordered today.  The ekg ordered today demonstrates sinus tachycardia rate 101 bpm  Recent Labs: 01/27/2023: B Natriuretic Peptide 53.5 10/09/2023: ALT 23 10/10/2023: BUN 12; Creatinine, Ser 0.72; Hemoglobin 15.1; Platelets 361; Potassium 3.6; Sodium 137  Recent Lipid Panel    Component Value Date/Time   CHOL 180 10/10/2023 0345   CHOL 146 11/16/2022 1555   TRIG 343 (H) 10/10/2023 0345   HDL 42 10/10/2023 0345   HDL 41 11/16/2022 1555   CHOLHDL 4.3 10/10/2023 0345   VLDL 69 (H) 10/10/2023 0345   LDLCALC 69 10/10/2023 0345   LDLCALC 65 11/16/2022 1555    Risk Assessment/Calculations:   CHA2DS2-VASc Score =     This indicates a  % annual risk of stroke. The patient's score is based upon:        Home Medications   Current Meds  Medication Sig   albuterol (VENTOLIN HFA) 108 (90 Base) MCG/ACT inhaler Inhale 2 puffs into the lungs every 4 (four) hours as needed for wheezing or shortness  of breath.   ALPRAZolam (XANAX) 0.5 MG tablet 1 tablet Orally once a day for 30 days As needed   amphetamine-dextroamphetamine (ADDERALL XR) 30 MG 24 hr capsule Take 60 mg by mouth daily.   aspirin EC 81 MG tablet Take 1 tablet (81 mg total) by mouth daily. Swallow whole.   atorvastatin (LIPITOR) 80 MG tablet Take 1 tablet (80 mg total) by mouth daily.   busPIRone (BUSPAR) 10 MG tablet Take by mouth.   colestipol (COLESTID) 1 g tablet TAKE 1 TABLET (1 GRAM DOSE) BY MOUTH TWICE A DAY   cyanocobalamin (VITAMIN B12) 1000 MCG tablet Take 1,000 mcg by mouth daily.   dicyclomine (BENTYL) 20 MG tablet Take 20 mg by mouth daily as needed for spasms.   escitalopram (LEXAPRO) 10 MG tablet Take 10 mg by mouth daily as needed (anxiety).   ezetimibe (ZETIA) 10 MG tablet Take 1 tablet (10 mg total) by mouth daily.   ferrous  sulfate 325 (65 FE) MG EC tablet Take 325 mg by mouth daily as needed (for iron).   furosemide (LASIX) 20 MG tablet Take 1 tablet (20 mg total) by mouth daily as needed (for lower extremity edema).   gabapentin (NEURONTIN) 400 MG capsule Take 800 mg by mouth daily as needed (for pain).   icosapent Ethyl (VASCEPA) 1 g capsule Take 2 capsules (2 g total) by mouth 2 (two) times daily.   ipratropium (ATROVENT) 0.03 % nasal spray 2 sprays.   losartan (COZAAR) 25 MG tablet Take 0.5 tablets (12.5 mg total) by mouth daily.   metoprolol succinate (TOPROL-XL) 50 MG 24 hr tablet Take 1 tablet (50 mg total) by mouth 2 (two) times daily. Take with or immediately following a meal.   nicotine (NICODERM CQ - DOSED IN MG/24 HOURS) 21 mg/24hr patch Place 1 patch (21 mg total) onto the skin daily.   nitroGLYCERIN (NITROSTAT) 0.4 MG SL tablet PLACE 1 TABLET UNDER THE TONGUE EVERY 5 MINUTES AS NEEDED FOR CHEST PAIN.   omeprazole (PRILOSEC) 40 MG capsule Take 40 mg by mouth every evening.   spironolactone (ALDACTONE) 25 MG tablet Take 1 tablet (25 mg total) by mouth daily.   VITAMIN D, CHOLECALCIFEROL, PO Take 1 tablet by mouth daily.   zolpidem (AMBIEN) 10 MG tablet Take 10 mg by mouth at bedtime.   [DISCONTINUED] aspirin EC 81 MG tablet Take 1 tablet (81 mg total) by mouth daily. Swallow whole. (Patient taking differently: Take 325 mg by mouth daily. Swallow whole.)   Current Facility-Administered Medications for the 10/19/23 encounter (Office Visit) with Sharlene Dory, PA-C  Medication   lactulose (CHRONULAC) 10 GM/15ML solution 20 g     Review of Systems      All other systems reviewed and are otherwise negative except as noted above.  Physical Exam    VS:  BP 114/82   Pulse 88   Ht 5' 2.5" (1.588 m)   Wt 172 lb (78 kg)   LMP  (LMP Unknown)   SpO2 98%   BMI 30.96 kg/m  , BMI Body mass index is 30.96 kg/m.  Wt Readings from Last 3 Encounters:  10/19/23 172 lb (78 kg)  10/10/23 182 lb 15.7 oz (83  kg)  05/26/23 180 lb (81.6 kg)     GEN: Well nourished, well developed, in no acute distress. HEENT: normal. Neck: Supple, no JVD, carotid bruits, or masses. Cardiac: RRR, no murmurs, rubs, or gallops. No clubbing, cyanosis, edema.  Radials/PT 2+ and equal bilaterally.  Respiratory:  Respirations regular and unlabored, RLL rhonchi. GI: Soft, nontender, nondistended. MS: No deformity or atrophy. Skin: Warm and dry, no rash. Neuro:  Strength and sensation are intact. Psych: Normal affect.  Assessment & Plan    Atypical Chest Pain -recent RCA stenting, stomach pain has subsided since stenting - Advise catheterization if abdominal pain radiates to chest. - Reassured atypical presentations are common in women, emphasized thorough cardiac evaluation.  Coronary Artery Disease (CAD) Significant blockage in right coronary artery not grafted during bypass. LDL 69, triglycerides 343. Rapid blockage progression requires aggressive cholesterol and triglyceride management. - Prescribe Vascepa for hypertriglyceridemia. - Recheck lipid panel and liver function tests in 8 weeks. - Encourage dietary modifications and exercise. - Discussed importance of controlling cholesterol and triglycerides to prevent further cardiac events.  Anxiety Significant anxiety potentially exacerbating cardiac symptoms. Currently on Lexapro and Xanax. Considering more consistent anxiety management. - Discuss with primary care provider adjusting SSRI dosage or switching medication. - Continue Xanax as rescue medication for acute anxiety.  Electrolyte Imbalance Low potassium and magnesium levels, supplemented during hospitalizations. Reports vitamin B and D deficiencies, possibly due to dietary or absorption issues. - Recheck electrolytes, including potassium and magnesium. - Advise on dietary intake of potassium and magnesium, consider multivitamin if deficiencies persist.  Follow-up Ongoing monitoring required for  complex cardiac and related health issues. Concerns about troponin levels and ongoing symptoms. - Order troponin test to assess cardiac enzyme levels. - Refer to cardiac rehabilitation if gastrointestinal symptoms controlled. - Schedule follow-up to review lab results and adjust treatment plan.    Cardiac Rehabilitation Eligibility Assessment  The patient is ready to start cardiac rehabilitation from a cardiac standpoint.   Disposition: Follow up 3-4 months with Verne Carrow, MD or APP.  Signed, Sharlene Dory, PA-C 10/19/2023, 5:25 PM [facility]

## 2023-10-20 ENCOUNTER — Other Ambulatory Visit (HOSPITAL_COMMUNITY): Payer: Self-pay

## 2023-10-20 ENCOUNTER — Telehealth: Payer: Self-pay | Admitting: Pharmacy Technician

## 2023-10-20 ENCOUNTER — Encounter: Payer: Self-pay | Admitting: Physician Assistant

## 2023-10-20 ENCOUNTER — Telehealth: Payer: Self-pay | Admitting: *Deleted

## 2023-10-20 ENCOUNTER — Ambulatory Visit: Admitting: Pharmacist Clinician (PhC)/ Clinical Pharmacy Specialist

## 2023-10-20 DIAGNOSIS — I25118 Atherosclerotic heart disease of native coronary artery with other forms of angina pectoris: Secondary | ICD-10-CM

## 2023-10-20 DIAGNOSIS — I2510 Atherosclerotic heart disease of native coronary artery without angina pectoris: Secondary | ICD-10-CM

## 2023-10-20 DIAGNOSIS — E7800 Pure hypercholesterolemia, unspecified: Secondary | ICD-10-CM

## 2023-10-20 DIAGNOSIS — Z79899 Other long term (current) drug therapy: Secondary | ICD-10-CM

## 2023-10-20 LAB — BASIC METABOLIC PANEL WITH GFR
BUN/Creatinine Ratio: 10 (ref 9–23)
BUN: 7 mg/dL (ref 6–24)
CO2: 23 mmol/L (ref 20–29)
Calcium: 10 mg/dL (ref 8.7–10.2)
Chloride: 96 mmol/L (ref 96–106)
Creatinine, Ser: 0.67 mg/dL (ref 0.57–1.00)
Glucose: 94 mg/dL (ref 70–99)
Potassium: 4.4 mmol/L (ref 3.5–5.2)
Sodium: 143 mmol/L (ref 134–144)
eGFR: 104 mL/min/{1.73_m2} (ref >59)

## 2023-10-20 LAB — HEPATIC FUNCTION PANEL
ALT: 28 IU/L (ref 0–32)
AST: 24 IU/L (ref 0–40)
Albumin: 4.6 g/dL (ref 3.8–4.9)
Alkaline Phosphatase: 128 IU/L — ABNORMAL HIGH (ref 44–121)
Bilirubin Total: 0.2 mg/dL (ref 0.0–1.2)
Bilirubin, Direct: 0.08 mg/dL (ref 0.00–0.40)
Total Protein: 7.1 g/dL (ref 6.0–8.5)

## 2023-10-20 LAB — LIPID PANEL
Chol/HDL Ratio: 3.2 ratio (ref 0.0–4.4)
Cholesterol, Total: 154 mg/dL (ref 100–199)
HDL: 48 mg/dL (ref >39)
LDL Chol Calc (NIH): 54 mg/dL (ref 0–99)
Triglycerides: 343 mg/dL — ABNORMAL HIGH (ref 0–149)
VLDL Cholesterol Cal: 52 mg/dL — ABNORMAL HIGH (ref 5–40)

## 2023-10-20 LAB — MAGNESIUM: Magnesium: 2.2 mg/dL (ref 1.6–2.3)

## 2023-10-20 LAB — TROPONIN T: Troponin T (Highly Sensitive): 7 ng/L (ref 0–14)

## 2023-10-20 MED ORDER — SPIRONOLACTONE 25 MG PO TABS: 25.0000 mg | ORAL_TABLET | Freq: Every day | ORAL | 1 refills | Status: DC

## 2023-10-20 NOTE — Telephone Encounter (Signed)
-----   Message from Sharlene Dory sent at 10/20/2023  1:35 PM EDT ----- [name],   Your kidneys and electrolytes look great.  Unfortunately, they also drew a lipid and LFT panel which should have been 8 weeks later after starting Vascepa.  We will have to correct this and bring you back in 2 months and redraw.  Sharlene Dory, PA-C

## 2023-10-20 NOTE — Telephone Encounter (Signed)
 Pharmacy Patient Advocate Encounter  Received notification from [facility] that Prior Authorization for vascepa has been APPROVED from 10/20/23 to 10/20/26   PA #/Case ID/Reference #: [id number]

## 2023-10-20 NOTE — Telephone Encounter (Signed)
 The patient has been notified of the result and verbalized understanding.  All questions (if any) were answered.  Pt made aware that her lipids/LFTs were incorrectly drawn by [facility] yesterday vs the ordered time frame to be drawn in 8 weeks.   Pt is aware that she will still need to have her lipids/LFTs drawn again in 8 weeks, to follow-up on Vascepa that was started on yesterday.    Did ensure the pt that we have communicated with our RN Supervisor to further assist in making sure she is not charged for incorrect lab draw yesterday.   Pt is aware we will reorder for her to get her lipids/LFTs drawn in 8 weeks, at our new building.   Advised her to come fasting to this lab appt.  Repeat lipids/LFTs placed for 8 weeks and released in the system.  Pt aware to mark this down on her calendar to come in and have this done around June 4th.   Pt did request a refill of her spironolactone to be sent to [facility].  Refill request sent in for the pt.   Pt verbalized understanding and agrees with this plan.  Pt was more than gracious for all the assistance provided.

## 2023-10-20 NOTE — Telephone Encounter (Signed)
 Pharmacy Patient Advocate Encounter   Received notification from CoverMyMeds that prior authorization for vascepa is required/requested.   Insurance verification completed.   The patient is insured through [facility] .   Per test claim: PA required; PA submitted to above mentioned insurance via CoverMyMeds Key/confirmation #/EOC [id number] Status is pending

## 2023-10-25 ENCOUNTER — Encounter (HOSPITAL_COMMUNITY): Payer: Self-pay

## 2023-11-08 ENCOUNTER — Telehealth: Payer: Self-pay | Admitting: Physician Assistant

## 2023-11-08 MED ORDER — SPIRONOLACTONE 25 MG PO TABS
25.0000 mg | ORAL_TABLET | Freq: Every day | ORAL | 3 refills | Status: AC
Start: 2023-11-08 — End: ?

## 2023-11-08 NOTE — Telephone Encounter (Signed)
 Pt's medication was sent to pt's pharmacy as requested. Confirmation received.

## 2023-11-08 NOTE — Telephone Encounter (Signed)
*  STAT* If patient is at the pharmacy, call can be transferred to refill team.   1. Which medications need to be refilled? (please list name of each medication and dose if known) spironolactone  (ALDACTONE ) 25 MG tablet    2. Would you like to learn more about the convenience, safety, & potential cost savings by using the [facility]? No      3. Are you open to using the [facility] (Type [facility].   4. Which pharmacy/location (including street and city if local pharmacy) is medication to be sent to? [id number] [place], [place] - [address]    5. Do they need a 30 day or 90 day supply? 90

## 2023-11-10 ENCOUNTER — Telehealth (HOSPITAL_COMMUNITY): Payer: Self-pay

## 2023-11-10 NOTE — Telephone Encounter (Signed)
No response from pt in regards to cardiac rehab. Closed referral 

## 2024-01-03 ENCOUNTER — Telehealth: Payer: Self-pay | Admitting: Cardiovascular Disease

## 2024-01-03 DIAGNOSIS — I2510 Atherosclerotic heart disease of native coronary artery without angina pectoris: Secondary | ICD-10-CM

## 2024-01-03 DIAGNOSIS — I255 Ischemic cardiomyopathy: Secondary | ICD-10-CM

## 2024-01-03 DIAGNOSIS — I4891 Unspecified atrial fibrillation: Secondary | ICD-10-CM

## 2024-01-03 DIAGNOSIS — I10 Essential (primary) hypertension: Secondary | ICD-10-CM

## 2024-01-03 DIAGNOSIS — I471 Supraventricular tachycardia, unspecified: Secondary | ICD-10-CM

## 2024-01-03 DIAGNOSIS — R002 Palpitations: Secondary | ICD-10-CM

## 2024-01-03 DIAGNOSIS — I493 Ventricular premature depolarization: Secondary | ICD-10-CM

## 2024-01-03 MED ORDER — METOPROLOL SUCCINATE ER 50 MG PO TB24: 50.0000 mg | ORAL_TABLET | Freq: Two times a day (BID) | ORAL | 3 refills | Status: DC

## 2024-01-03 MED ORDER — LOSARTAN POTASSIUM 25 MG PO TABS
12.5000 mg | ORAL_TABLET | Freq: Every day | ORAL | 3 refills | Status: DC
Start: 2024-01-03 — End: 2024-03-23

## 2024-01-03 MED ORDER — ASPIRIN 81 MG PO TBEC: 81.0000 mg | DELAYED_RELEASE_TABLET | Freq: Every day | ORAL | 3 refills | Status: AC

## 2024-01-03 MED ORDER — TICAGRELOR 90 MG PO TABS: 90.0000 mg | ORAL_TABLET | Freq: Two times a day (BID) | ORAL | 3 refills | Status: DC

## 2024-01-03 NOTE — Telephone Encounter (Signed)
*  STAT* If patient is at the pharmacy, call can be transferred to refill team.   1. Which medications need to be refilled? (please list name of each medication and dose if known)  ticagrelor  (BRILINTA ) 90 MG TABS tablet losartan  (COZAAR ) 25 MG tablet metoprolol  succinate (TOPROL -XL) 50 MG 24 hr tablet aspirin  EC 81 MG tablet    2. Which pharmacy/location (including street and city if local pharmacy) is medication to be sent to? [id number] [place], [place] - [address]   3. Do they need a 30 day or 90 day supply?  90 day supply

## 2024-01-03 NOTE — Telephone Encounter (Signed)
 RX sent to requested Pharmacy

## 2024-01-10 ENCOUNTER — Encounter (HOSPITAL_COMMUNITY): Payer: Self-pay

## 2024-01-10 ENCOUNTER — Emergency Department (HOSPITAL_COMMUNITY)

## 2024-01-10 ENCOUNTER — Other Ambulatory Visit: Payer: Self-pay

## 2024-01-10 ENCOUNTER — Emergency Department (HOSPITAL_COMMUNITY)
Admission: EM | Admit: 2024-01-10 | Discharge: 2024-01-10 | Disposition: A | Discharge status: Home / Self Care | Source: Ambulatory Visit | Attending: Emergency Medicine | Admitting: Emergency Medicine

## 2024-01-10 DIAGNOSIS — Z951 Presence of aortocoronary bypass graft: Secondary | ICD-10-CM | POA: Insufficient documentation

## 2024-01-10 DIAGNOSIS — R079 Chest pain, unspecified: Secondary | ICD-10-CM | POA: Diagnosis present

## 2024-01-10 DIAGNOSIS — M79674 Pain in right toe(s): Secondary | ICD-10-CM | POA: Diagnosis not present

## 2024-01-10 DIAGNOSIS — Z7982 Long term (current) use of aspirin: Secondary | ICD-10-CM | POA: Diagnosis not present

## 2024-01-10 DIAGNOSIS — F17200 Nicotine dependence, unspecified, uncomplicated: Secondary | ICD-10-CM | POA: Insufficient documentation

## 2024-01-10 DIAGNOSIS — R0602 Shortness of breath: Secondary | ICD-10-CM | POA: Insufficient documentation

## 2024-01-10 DIAGNOSIS — K148 Other diseases of tongue: Secondary | ICD-10-CM | POA: Insufficient documentation

## 2024-01-10 LAB — BASIC METABOLIC PANEL WITH GFR
Anion gap: 11 (ref 5–15)
BUN: 9 mg/dL (ref 6–20)
CO2: 23 mmol/L (ref 22–32)
Calcium: 9.3 mg/dL (ref 8.9–10.3)
Chloride: 104 mmol/L (ref 98–111)
Creatinine, Ser: 0.75 mg/dL (ref 0.44–1.00)
GFR, Estimated: >60 mL/min (ref >60)
Glucose, Bld: 118 mg/dL — ABNORMAL HIGH (ref 70–99)
Potassium: 3.5 mmol/L (ref 3.5–5.1)
Sodium: 138 mmol/L (ref 135–145)

## 2024-01-10 LAB — CBC
HCT: 39.8 % (ref 36.0–46.0)
Hemoglobin: 13.4 g/dL (ref 12.0–15.0)
MCH: 31.2 pg (ref 26.0–34.0)
MCHC: 33.7 g/dL (ref 30.0–36.0)
MCV: 92.6 fL (ref 80.0–100.0)
Platelets: 304 10*3/uL (ref 150–400)
RBC: 4.3 MIL/uL (ref 3.87–5.11)
RDW: 14.2 % (ref 11.5–15.5)
WBC: 8.2 10*3/uL (ref 4.0–10.5)
nRBC: 0 % (ref 0.0–0.2)

## 2024-01-10 LAB — PROTIME-INR
INR: 1.1 (ref 0.8–1.2)
Prothrombin Time: 14.5 s (ref 11.4–15.2)

## 2024-01-10 LAB — TROPONIN I (HIGH SENSITIVITY)
Troponin I (High Sensitivity): 12 ng/L (ref <18)
Troponin I (High Sensitivity): 11 ng/L (ref <18)

## 2024-01-10 MED ORDER — ASPIRIN 81 MG PO CHEW
162.0000 mg | CHEWABLE_TABLET | Freq: Once | ORAL | Status: AC
  Administered 2024-01-10: 162 mg via ORAL
  Filled 2024-01-10: qty 2

## 2024-01-10 MED ORDER — IOHEXOL 350 MG/ML SOLN
75.0000 mL | Freq: Once | INTRAVENOUS | Status: AC | PRN
  Administered 2024-01-10: 75 mL via INTRAVENOUS

## 2024-01-10 NOTE — ED Triage Notes (Signed)
 Pt coming in from home with shortness of breath, sweating. Pt reports white sputum. Pt reports nausea and vomiting. Pt reports chest pain.

## 2024-01-10 NOTE — ED Provider Triage Note (Signed)
 Emergency Medicine Provider Triage Evaluation Note  [name] , a 53 y.o. female  was evaluated in triage.  Pt complains of chest pain has been going on and off for several days.  Associated with shortness of breath.  Patient has a known history of coronary artery disease.  Patient is also had some nausea.  Review of Systems  Positive: Chest pain, shortness of breath Negative: Vomiting  Physical Exam  BP 138/87   Pulse 99   Temp 98.1 F (36.7 C)   Resp 20   Ht 1.575 m (5' 2)   Wt 78 kg   SpO2 99%   BMI 31.46 kg/m  Gen: Awake, no distress   Resp: Normal effort bilateral rhonchi MSK:  Moves extremities without difficulty  Other:    Medical Decision Making  Medically screening exam initiated at 1:45 PM.  Appropriate orders placed.  [name] was informed that the remainder of the evaluation will be completed by another provider, this initial triage assessment does not replace that evaluation, and the importance of remaining in the ED until their evaluation is complete.  Known history of coronary artery disease.  Will need troponins basic labs chest x-ray EKG normal sinus rhythm nonspecific T wave changes not consistent with a STEMI.  Patient stable to go back to waiting room.   Justin Buechner, MD 01/10/24 1359

## 2024-01-10 NOTE — ED Notes (Signed)
 Pt reports that she was seen at the walk in clinic this morning for a bump on her tongue she is concerned about and a broken toe. Pt reports that the doctor there sent her here after he saw her EKG.  \

## 2024-01-10 NOTE — ED Notes (Signed)
 Patient discharged in stable condition, education materials explained including, follow up, any prescriptions and reasons to return. Patient voiced agreement to education and discharge material.

## 2024-01-10 NOTE — Discharge Instructions (Signed)
 Follow-up with Dr. Jesus for the lesion on your tongue.

## 2024-01-10 NOTE — ED Provider Notes (Signed)
 [place] EMERGENCY DEPARTMENT AT [facility] Provider Note   CSN: [id number] Arrival date & time: 01/10/24  1301     Patient presents with: No chief complaint on file.   [name] is a 53 y.o. female.   HPI Patient with shortness of breath and some chest pain.  Worse when she puts her head down.  Been going for last few days.  History of STEMI and CABG.  States that also gets reflux when she lays flat.  Not exertional pain.  However has been more short of breath.  No fevers or chills.  Also has pain on her right great toe.  Has been going for a while.  No injury.  Also has dark lesion on tongue.  Swollen.  States next to it there is a new swelling.  Patient is a smoker.  Also states has been having difficulty swallowing.  States that had been worse since her cervical neck surgery.  States she will feel food get caught in there.      Prior to Admission medications   Medication Sig Start Date End Date Taking? Authorizing Provider  albuterol  (VENTOLIN  HFA) 108 (90 Base) MCG/ACT inhaler Inhale 2 puffs into the lungs every 4 (four) hours as needed for wheezing or shortness of breath. 10/15/22   Roddenberry, Myron G, PA-C  ALPRAZolam (XANAX) 0.5 MG tablet 1 tablet Orally once a day for 30 days As needed 10/15/23   [provider]  amphetamine -dextroamphetamine  (ADDERALL XR) 30 MG 24 hr capsule Take 60 mg by mouth daily.    [provider]  aspirin  EC 81 MG tablet Take 1 tablet (81 mg total) by mouth daily. Swallow whole. 01/03/24   Verlin Lonni BIRCH, MD  atorvastatin  (LIPITOR ) 80 MG tablet Take 1 tablet (80 mg total) by mouth daily. 09/06/23   Verlin Lonni BIRCH, MD  busPIRone (BUSPAR) 10 MG tablet Take by mouth. 08/30/23   [provider]  colestipol (COLESTID) 1 g tablet TAKE 1 TABLET (1 GRAM DOSE) BY MOUTH TWICE A DAY    [provider]  cyanocobalamin  (VITAMIN B12) 1000 MCG tablet Take 1,000 mcg by mouth daily.    [provider]  dicyclomine (BENTYL) 20 MG tablet Take 20 mg by mouth daily as needed for spasms. 04/01/23   [provider]  escitalopram  (LEXAPRO ) 10 MG tablet Take 10 mg by mouth daily as needed (anxiety).    [provider]  ezetimibe  (ZETIA ) 10 MG tablet Take 1 tablet (10 mg total) by mouth daily. 10/12/23   Sheron Lorette GRADE, PA-C  ferrous sulfate  325 (65 FE) MG EC tablet Take 325 mg by mouth daily as needed (for iron).    [provider]  furosemide  (LASIX ) 20 MG tablet Take 1 tablet (20 mg total) by mouth daily as needed (for lower extremity edema). 10/11/23   Sheron Lorette GRADE, PA-C  gabapentin  (NEURONTIN ) 400 MG capsule Take 800 mg by mouth daily as needed (for pain).    [provider]  icosapent  Ethyl (VASCEPA ) 1 g capsule Take 2 capsules (2 g total) by mouth 2 (two) times daily. 10/19/23   Lucien Orren SAILOR, PA-C  ipratropium (ATROVENT) 0.03 % nasal spray 2 sprays.    [provider]  losartan  (COZAAR ) 25 MG tablet Take 0.5 tablets (12.5 mg total) by mouth daily. 01/03/24   Verlin Lonni BIRCH, MD  metoprolol  succinate (TOPROL -XL) 50 MG 24 hr tablet Take 1 tablet (50 mg total) by mouth 2 (two) times  daily. Take with or immediately following a meal. 01/03/24   Verlin Lonni BIRCH, MD  nicotine  (NICODERM CQ  - DOSED IN MG/24 HOURS) 21 mg/24hr patch Place 1 patch (21 mg total) onto the skin daily. 10/11/23   Sheron Lorette GRADE, PA-C  nitroGLYCERIN  (NITROSTAT ) 0.4 MG SL tablet PLACE 1 TABLET UNDER THE TONGUE EVERY 5 MINUTES AS NEEDED FOR CHEST PAIN. 10/05/23   Verlin Lonni BIRCH, MD  omeprazole (PRILOSEC) 40 MG capsule Take 40 mg by mouth every evening.    [provider]  spironolactone  (ALDACTONE ) 25 MG tablet Take 1 tablet (25 mg total) by mouth daily. 11/08/23   Lucien Orren SAILOR, PA-C  ticagrelor  (BRILINTA ) 90 MG TABS tablet Take 1 tablet (90 mg total) by mouth 2 (two) times daily. 01/03/24   Verlin Lonni BIRCH, MD  VITAMIN D,  CHOLECALCIFEROL, PO Take 1 tablet by mouth daily.    [provider]  zolpidem  (AMBIEN ) 10 MG tablet Take 10 mg by mouth at bedtime.    [provider]    Allergies: Zoloft [sertraline]    Review of Systems  Updated Vital Signs BP 126/82 (BP Location: Right Arm)   Pulse 84   Temp 98.8 F (37.1 C)   Resp 16   Ht 5' 2 (1.575 m)   Wt 78 kg   SpO2 99%   BMI 31.46 kg/m   Physical Exam Vitals and nursing note reviewed.  HENT:     Mouth/Throat:     Comments: On the right front tongue there is a darkened firm area.  Approximately 5 mm across.  Cardiovascular:     Rate and Rhythm: Normal rate.  Pulmonary:     Breath sounds: No wheezing.  Abdominal:     Tenderness: There is no abdominal tenderness.   Musculoskeletal:        General: No tenderness.   Neurological:     General: No focal deficit present.     Mental Status: She is alert.     (all labs ordered are listed, but only abnormal results are displayed) Labs Reviewed  BASIC METABOLIC PANEL WITH GFR - Abnormal; Notable for the following components:      Result Value   Glucose, Bld 118 (*)    All other components within normal limits  CBC  PROTIME-INR  TROPONIN I (HIGH SENSITIVITY)  TROPONIN I (HIGH SENSITIVITY)    EKG: EKG Interpretation Date/Time:  Monday January 10 2024 13:12:10 EDT Ventricular Rate:  93 PR Interval:  174 QRS Duration:  92 QT Interval:  360 QTC Calculation: 447 R Axis:   5  Text Interpretation: Normal sinus rhythm Nonspecific T wave abnormality Abnormal ECG When compared with ECG of 19-Oct-2023 14:47,  nonspecific ST changes Confirmed by Patsey Lot [phone]) on 01/10/2024 5:39:12 PM  Radiology: CT Soft Tissue Neck W Contrast Result Date: 01/10/2024 CLINICAL DATA:  Difficulty swallowing since neck surgery. Neck pain that gets worse laying down flat. Difficulty breathing while laying flat. Bump on her tongue. EXAM: CT NECK WITH CONTRAST TECHNIQUE: Multidetector CT  imaging of the neck was performed using the standard protocol following the bolus administration of intravenous contrast. RADIATION DOSE REDUCTION: This exam was performed according to the departmental dose-optimization program which includes automated exposure control, adjustment of the mA and/or kV according to patient size and/or use of iterative reconstruction technique. CONTRAST:  75mL OMNIPAQUE  IOHEXOL  350 MG/ML SOLN COMPARISON:  None Available. FINDINGS: Pharynx and larynx: Normal. No mass or swelling. Salivary glands: No inflammation, mass, or stone. Thyroid: Normal.  Lymph nodes: None enlarged or abnormal density. Vascular: Bilateral carotid bifurcation atherosclerosis. Limited intracranial: Negative. Visualized orbits: Negative. Mastoids and visualized paranasal sinuses: Mostly clear sinuses. No mastoid effusions Skeleton: No evidence of acute abnormality on limited assessment. C4-C7 ACDF. Upper chest: Visualized lung apices are clear. IMPRESSION: 1. No evidence of acute abnormality in the neck. 2. Reported tongue lesion is not visible by CT. Correlate with direct inspection. Electronically Signed   By: Gilmore GORMAN Molt M.D.   On: 01/10/2024 19:31   DG Toe Great Right Result Date: 01/10/2024 CLINICAL DATA:  First metatarsal-phalangeal pain. EXAM: RIGHT GREAT TOE COMPARISON:  None Available. FINDINGS: There is no evidence of fracture or dislocation. No erosive or bony destructive change. There is mild degenerative spurring of the metatarsal phalangeal joint. No soft tissue calcifications. Soft tissues are unremarkable. IMPRESSION: Mild degenerative spurring of the first metatarsophalangeal joint. Electronically Signed   By: Andrea Gasman M.D.   On: 01/10/2024 18:45   DG Chest 2 View Result Date: 01/10/2024 CLINICAL DATA:  Chest pain. EXAM: CHEST - 2 VIEW COMPARISON:  AP chest 03/25/2023 FINDINGS: Status post median sternotomy. Left atrial appendage clip again noted. Cardiac silhouette is again  mildly enlarged mediastinal contours are within normal limits. The lungs are clear. Mild-to-moderate multilevel degenerative disc changes of the thoracic spine. Partial visualization of ACDF hardware. Cholecystectomy clips. IMPRESSION: 1. No active cardiopulmonary disease. 2. Stable mild cardiomegaly. Electronically Signed   By: Tanda Lyons M.D.   On: 01/10/2024 14:37     Procedures   Medications Ordered in the ED  aspirin  chewable tablet 162 mg (162 mg Oral Given 01/10/24 1401)  iohexol  (OMNIPAQUE ) 350 MG/ML injection 75 mL (75 mLs Intravenous Contrast Given 01/10/24 1851)                                    Medical Decision Making Amount and/or Complexity of Data Reviewed Labs: ordered. Radiology: ordered.  Risk Prescription drug management.   Patient also complaints.  Chest pain shortness of breath.  Previous CABG.  Has RCA lesion that was not intervened on.  Reviewing cardiology note they were following for abdominal pain.  Not having abdominal pain.  Initial troponin negative.  EKG reassuring.  Reportedly sent in from urgent care for EKG abnormalities.  X-ray reassuring.  Will get x-ray of the toe since there is pain.  Will also get CT scan of the neck to evaluate for difficulty swallowing and occasional choking since surgery.  Reviewed cardiology note.  CT scan reassuring.  Troponin negative x 2.  With the potential lesion on the tongue I think ENT follow-up is reasonable.  Doubt cardiac ischemia.  Potential does have a reflux component.  Will discharge home.      Final diagnoses:  Nonspecific chest pain  Tongue lesion    ED Discharge Orders     None          Patsey Lot, MD 01/10/24 2002

## 2024-01-11 ENCOUNTER — Telehealth: Payer: Self-pay | Admitting: Cardiovascular Disease

## 2024-01-11 ENCOUNTER — Encounter: Payer: Self-pay | Admitting: Cardiology

## 2024-01-11 ENCOUNTER — Ambulatory Visit: Attending: Cardiology | Admitting: Cardiology

## 2024-01-11 VITALS — BP 110/68 | HR 84 | Ht 62.0 in | Wt 174.8 lb

## 2024-01-11 DIAGNOSIS — I5020 Unspecified systolic (congestive) heart failure: Secondary | ICD-10-CM

## 2024-01-11 DIAGNOSIS — R079 Chest pain, unspecified: Secondary | ICD-10-CM

## 2024-01-11 DIAGNOSIS — I2510 Atherosclerotic heart disease of native coronary artery without angina pectoris: Secondary | ICD-10-CM

## 2024-01-11 DIAGNOSIS — R0602 Shortness of breath: Secondary | ICD-10-CM

## 2024-01-11 MED ORDER — EZETIMIBE 10 MG PO TABS: 10.0000 mg | ORAL_TABLET | Freq: Every day | ORAL | 2 refills | Status: AC

## 2024-01-11 NOTE — Telephone Encounter (Signed)
 Pt c/o Shortness Of Breath: STAT if SOB developed within the last 24 hours or pt is noticeably SOB on the phone  1. Are you currently SOB (can you hear that pt is SOB on the phone)?  No   2. How long have you been experiencing SOB?  Got worse 2 days ago   3. Are you SOB when sitting or when up moving around?  Both   4. Are you currently experiencing any other symptoms?  Dizziness, nausea, vomiting, sweats, gets choked up easily--Went to the ED yesterday, but got discharged and told nothing was wrong.

## 2024-01-11 NOTE — Telephone Encounter (Signed)
*  STAT* If patient is at the pharmacy, call can be transferred to refill team.   1. Which medications need to be refilled? (please list name of each medication and dose if known)  ezetimibe  (ZETIA ) 10 MG tablet  2. Which pharmacy/location (including street and city if local pharmacy) is medication to be sent to? [id number] [place], [place] - [address]   3. Do they need a 30 day or 90 day supply?  90 day supply

## 2024-01-11 NOTE — Telephone Encounter (Signed)
 Spoke to patient she stated 2 nights ago she had episode of chest pain,sob,perspiring heavy.Stated she went to [facility] walk in clinic.EKG was done she was told abnormal.She was advised to go to ED.She went to [facility] ED yesterday all test done normal.Stated she continues to feel bad.No chest pain at present.Appointment scheduled with Katlyn West NP this afternoon at 3:35 pm.Directions given.

## 2024-01-11 NOTE — Telephone Encounter (Signed)
 Pt's medication was sent to pt's pharmacy as requested. Confirmation received.

## 2024-01-11 NOTE — Progress Notes (Unsigned)
 Cardiology Office Note    Date:  01/11/2024  ID:  [name], DOB Jan 14, 1971, MRN [id number] PCP:  Arloa Elsie SAUNDERS, MD  Cardiologist:  Lonni Cash, MD  Electrophysiologist:  None   Chief Complaint: Shortness of breath   History of Present Illness: .    [name] is a 53 y.o. female with visit-pertinent history of CABG x 4 in 09/2022, ICM, possible atrial fibrillation not on anticoagulation, SVT, PVCs and hyperlipidemia.  Patient was first seen as a new patient for the evaluation of cardiac risk factors in March 2021.  Normal exercise stress test in March 2021, echo at that time indicated LVEF 50 to 55%, no valve disease.  Cardiac monitor in April 2021 with sinus rhythm, 2 short runs of VT, longest lasting 9 beats, 3 short runs of SVT.  She was told that she had atrial fibrillation back in 2023.  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  Echo in January 2024 indicated LVEF 35 to 40%, mild to moderate mitral valve regurgitation.  Coronary CTA in January 2024 with moderate three-vessel CAD.  Cardiac cath on 09/07/2022 with severe LAD and diagonal disease.  She underwent four-vessel CABG on 10/08/2022 with overall uneventful postoperative course.  She also underwent pulmonary vein isolation ablation and ligation of the left atrial appendage at the time of her surgery.  Echo in June 2024 with normal LV function and no significant valve disease.  Patient presented the ED in March 2025 with chest pain.  Troponin levels were trending down EKG revealed no other significant abnormalities her baseline.  She underwent successful angioplasty and stenting to the dominant RCA which was not bypassed previously.  She was recommended to continue high intensity statins with goal of LDL less than 70 and recommended DAPT for 1 year.  Patient was last seen in clinic on 10/19/2023.  Patient reported ongoing chest pain.  She describes pain as sharp and similar to the pain experienced during her heart  attack.  She also reported history of atypical symptoms proceeding cardiac events including and stomach pain and sensation similar to heartburn.   On 01/10/2024 patient presented to the ED with shortness of breath and chest pain.  Patient report was worse when she puts her head down.  Workup overall reassuring including troponins negative x 2 and nonischemic EKG.  Patient presents today as an acute add-on regarding increased shortness of breath.  Patient reports that she has been having ongoing problems with increased shortness of breath, reports that Sunday night she was at home and felt as though she was unable to catch her breath, reports intermittent episodes of this.  Patient reports that following her bypass surgery she had problems with choking and feeling as though her throat was closing.  She feels as though she is unable to take air in. She notes she continues to have intermittent problems with this.  She also reports and increased cough, notes when she lays down on her side she can produce clear/white mucus.  Denies any significant shortness of breath when laying down flat, denies increased lower extremity edema, orthopnea or PND.  She does report that her abdomen feels more bloated than normal, although notes irregular bowel movements.  She does report intermittent chest discomfort, points out different areas on her chest, notes that different times of the day she could have different feelings across her chest.  She does report a midsternal chest discomfort that she questions if it has been related to increased coughing,  notes that this is not similar to her prior anginal equivalents.  She was reassured yesterday with negative troponins and reassuring workup in the emergency department.  She notes that her biggest concern has been problems with ongoing coughing and shortness of breath.  ROS: .   Today she denies lower extremity edema, fatigue, palpitations, melena, hematuria, hemoptysis,  diaphoresis, weakness, presyncope, syncope, orthopnea, and PND.  All other systems are reviewed and otherwise negative. Studies Reviewed: SABRA    EKG:  EKG is not ordered today.  CV Studies: Cardiac studies reviewed are outlined and summarized above. Otherwise please see EMR for full report. Cardiac Studies & Procedures   ______________________________________________________________________________________________ CARDIAC CATHETERIZATION  CARDIAC CATHETERIZATION 09/07/2022  Conclusion Conclusions: Multivessel coronary artery disease, as detailed below.  Most significant disease involves the mid LAD, where sequential 90% and 70% lesions flank an aneurysmal segment where a large D2 branch arises from.  The LAD lesions are highly significant by RFR (RFR 0.46).  D2 also contains a 70% proximal stenosis.  LCx and RCA demonstrate non-obstructive coronary artery disease (RFR mid RCA 0.94). Upper normal to mildly elevated left heart filling pressures (LVEDP 17 mmHg, PCWP 13 mmHg). Normal right heart and pulmonary artery pressures (mean RA 6 mmHg, mean PAP 18 mmHg). Mildly reduced Fick cardiac output/index (CO 4.0 L/min, CI 2.2 L/min/m^2).  Recommendations: Given complex anatomy of LAD/D2 disease, recommend cardiac surgery consultation and [facility] discussion regarding optimal management strategy. Discontinue diltiazem  and start metoprolol  succinate 25 mg daily in the setting of cardiomyophathy. Restart apixaban  tomorrow morning if no evidence of bleeding/vascular complications at catheterization sites. Aggressive secondary prevention of coronary artery disease.  Consider escalation of statin therapy to high-intensity dosing at follow-up.  Lonni Hanson, MD [facility]  Findings Coronary Findings Diagnostic  Dominance: Right  Left Main Vessel is large. Mid LM to Ost LAD lesion is 30% stenosed. The lesion is eccentric. The lesion is moderately calcified.  Left Anterior Descending Vessel  is large. Prox LAD to Mid LAD lesion is 20% stenosed. The lesion is moderately calcified. Mid LAD-1 lesion is 90% stenosed. The lesion is focal and eccentric. The lesion is moderately calcified. Mid LAD-2 lesion is 70% stenosed. The lesion is focal.  First Diagonal Branch Vessel is small in size. 1st Diag lesion is 50% stenosed.  Second Diagonal Branch Vessel is large in size. 2nd Diag lesion is 70% stenosed. The lesion is moderately calcified.  Ramus Intermedius Vessel is moderate in size. Ramus lesion is 30% stenosed.  Left Circumflex Vessel is moderate in size. Ost Cx to Prox Cx lesion is 40% stenosed.  First Obtuse Marginal Branch Vessel is moderate in size.  Right Coronary Artery Vessel is large. Ost RCA lesion is 20% stenosed. Mid RCA lesion is 60% stenosed. The lesion is focal.  Right Posterior Descending Artery Vessel is moderate in size.  Right Posterior Atrioventricular Artery Vessel is large in size.  First Right Posterolateral Branch Vessel is small in size.  Second Right Posterolateral Branch Vessel is moderate in size.  Third Right Posterolateral Branch Vessel is moderate in size.  Intervention  No interventions have been documented.   STRESS TESTS  EXERCISE TOLERANCE TEST (ETT) 09/22/2019  Narrative  Blood pressure demonstrated a normal response to exercise.  There was no ST segment deviation noted during stress.  The patient exercised for 9 min and 10 sec reaching 89% MPHR with normal exercise capacity of 10.1 mets.  The patient complained of mild leg fatigue and vague cramping in upper right  chest area.  Negative ETT for ischemia.  This is a low risk study.   ECHOCARDIOGRAM  ECHOCARDIOGRAM COMPLETE 12/18/2022  Narrative ECHOCARDIOGRAM REPORT    Patient Name:   [name] Date of Exam: 12/18/2022 Medical Rec #:  [id number]         Height:       62.5 in Accession #:    [id number]        Weight:       176.2 lb Date of  Birth:  04-17-1971         BSA:          1.823 m Patient Age:    51 years          BP:           118/82 mmHg Patient Gender: F                 HR:           98 bpm. Exam Location:  [address]  Procedure: 2D Echo, Cardiac Doppler and Color Doppler  Indications:    I25.5 Ischemic cardiomyopathy  History:        Patient has prior history of Echocardiogram examinations, most recent 10/08/2022. CAD, Prior CABG and Atrial Appendage Clip, Arrythmias:Atrial Fibrillation; Risk Factors:Dyslipidemia and Current Smoker.  Sonographer:    Carl Rodgers-Jones RDCS Referring Phys: [id number] CHRISTOPHER D MCALHANY  IMPRESSIONS   1. Left ventricular ejection fraction, by estimation, is 55 to 60%. Left ventricular ejection fraction by PLAX is 59 %. The left ventricle has normal function. The left ventricle demonstrates regional wall motion abnormalities (see scoring diagram/findings for description). There is mild left ventricular hypertrophy. Left ventricular diastolic parameters are consistent with Grade I diastolic dysfunction (impaired relaxation). There is moderate hypokinesis of the left ventricular, basal-mid septal wall. 2. Right ventricular systolic function is moderately reduced. The right ventricular size is normal. Tricuspid regurgitation signal is inadequate for assessing PA pressure. 3. The mitral valve is abnormal. Trivial mitral valve regurgitation. 4. The aortic valve is tricuspid. Aortic valve regurgitation is not visualized. Aortic valve sclerosis is present, with no evidence of aortic valve stenosis. 5. Aortic dilatation noted. There is borderline dilatation of the ascending aorta, measuring 37 mm. 6. The inferior vena cava is normal in size with greater than 50% respiratory variability, suggesting right atrial pressure of 3 mmHg. 7. Cannot exclude a small PFO.  Comparison(s): Changes from prior study are noted. 10/08/2022: LVEF 35-40%.  FINDINGS Left Ventricle: Left ventricular  ejection fraction, by estimation, is 55 to 60%. Left ventricular ejection fraction by PLAX is 59 %. The left ventricle has normal function. The left ventricle demonstrates regional wall motion abnormalities. Moderate hypokinesis of the left ventricular, basal-mid septal wall. The left ventricular internal cavity size was normal in size. There is mild left ventricular hypertrophy. Abnormal (paradoxical) septal motion consistent with post-operative status. Left ventricular diastolic parameters are consistent with Grade I diastolic dysfunction (impaired relaxation). Indeterminate filling pressures.   LV Wall Scoring: The mid inferoseptal segment and basal inferoseptal segment are hypokinetic.  Right Ventricle: The right ventricular size is normal. No increase in right ventricular wall thickness. Right ventricular systolic function is moderately reduced. Tricuspid regurgitation signal is inadequate for assessing PA pressure.  Left Atrium: Left atrial size was normal in size.  Right Atrium: Right atrial size was normal in size.  Pericardium: There is no evidence of pericardial effusion.  Mitral Valve: The mitral valve is abnormal. There is mild calcification of the  anterior and posterior mitral valve leaflet(s). Trivial mitral valve regurgitation.  Tricuspid Valve: The tricuspid valve is grossly normal. Tricuspid valve regurgitation is trivial.  Aortic Valve: The aortic valve is tricuspid. Aortic valve regurgitation is not visualized. Aortic valve sclerosis is present, with no evidence of aortic valve stenosis.  Pulmonic Valve: The pulmonic valve was normal in structure. Pulmonic valve regurgitation is not visualized.  Aorta: Aortic dilatation noted. There is borderline dilatation of the ascending aorta, measuring 37 mm.  Venous: The inferior vena cava is normal in size with greater than 50% respiratory variability, suggesting right atrial pressure of 3 mmHg.  IAS/Shunts: Cannot exclude a  small PFO.   LEFT VENTRICLE PLAX 2D LV EF:         Left            Diastology ventricular     LV e' medial:    6.85 cm/s ejection        LV E/e' medial:  10.4 fraction by     LV e' lateral:   10.70 cm/s PLAX is 59      LV E/e' lateral: 6.7 %. LVIDd:         5.10 cm LVIDs:         3.50 cm LV PW:         1.20 cm LV IVS:        1.00 cm LVOT diam:     2.20 cm LV SV:         52 LV SV Index:   29 LVOT Area:     3.80 cm   RIGHT VENTRICLE            IVC RV Basal diam:  3.20 cm    IVC diam: 0.60 cm RV S prime:     6.59 cm/s TAPSE (M-mode): 1.5 cm  LEFT ATRIUM             Index        RIGHT ATRIUM           Index LA diam:        4.50 cm 2.47 cm/m   RA Area:     12.90 cm LA Vol (A2C):   35.0 ml 19.20 ml/m  RA Volume:   27.40 ml  15.03 ml/m LA Vol (A4C):   27.0 ml 14.81 ml/m LA Biplane Vol: 30.8 ml 16.90 ml/m AORTIC VALVE LVOT Vmax:   86.10 cm/s LVOT Vmean:  55.333 cm/s LVOT VTI:    0.137 m  AORTA Ao Root diam: 3.30 cm Ao Asc diam:  3.70 cm  MITRAL VALVE MV Area (PHT): 5.13 cm    SHUNTS MV Decel Time: 148 msec    Systemic VTI:  0.14 m MV E velocity: 71.30 cm/s  Systemic Diam: 2.20 cm MV A velocity: 88.30 cm/s MV E/A ratio:  0.81  Vinie Maxcy MD Electronically signed by Vinie Maxcy MD Signature Date/Time: 12/18/2022/12:33:50 PM    Final   TEE  ECHO INTRAOPERATIVE TEE 10/08/2022  Narrative *INTRAOPERATIVE TRANSESOPHAGEAL REPORT *    Patient Name:   [name] Date of Exam: 10/08/2022 Medical Rec #:  [id number]         Height:       62.0 in Accession #:    [id number]        Weight:       180.9 lb Date of Birth:  28-Feb-1971         BSA:          1.83  m Patient Age:    51 years          BP:           114/64 mmHg Patient Gender: F                 HR:           78 bpm. Exam Location:  Anesthesiology  Transesophogeal exam was perform intraoperatively during surgical procedure. Patient was closely monitored under general anesthesia during the entirety  of examination.  Indications:     CABG Performing Phys: [id number] DORISE MARLA FELLERS Diagnosing Phys: Elsie Needle MD  Complications: No known complications during this procedure. POST-OP IMPRESSIONS Overall, there were no significant changes from pre-bypass.  PRE-OP FINDINGS Left Ventricle: The left ventricle has low normal systolic function, with an ejection fraction of 50-55%. The cavity size was normal. There is mildly increased left ventricular wall thickness. No evidence of left ventricular regional wall motion abnormalities. There is mild left ventricular hypertrophy.   Right Ventricle: The right ventricle has normal systolic function. The cavity was normal. There is no increase in right ventricular wall thickness.  Left Atrium: Left atrial size was not assessed. No left atrial/left atrial appendage thrombus was detected.  Right Atrium: Right atrial size was not assessed.  Interatrial Septum: No atrial level shunt detected by color flow Doppler.  Pericardium: There is no evidence of pericardial effusion.  Mitral Valve: The mitral valve is normal in structure. Mitral valve regurgitation is mild by color flow Doppler.  Tricuspid Valve: The tricuspid valve was normal in structure. Tricuspid valve regurgitation is mild by color flow Doppler.  Aortic Valve: The aortic valve is normal in structure. Aortic valve regurgitation was not visualized by color flow Doppler.   Pulmonic Valve: The pulmonic valve was normal in structure. Pulmonic valve regurgitation is not visualized by color flow Doppler.   +--------------+-------++ LEFT VENTRICLE        +--------------+-------++ PLAX 2D               +--------------+-------++ LVIDd:        4.10 cm +--------------+-------++ LV PW:        1.20 cm +--------------+-------++ LV IVS:       0.80 cm +--------------+-------++                       +--------------+-------++   Elsie Needle  MD Electronically signed by Elsie Needle MD Signature Date/Time: 10/08/2022/3:25:57 PM    Final  MONITORS  LONG TERM MONITOR (3-14 DAYS) 09/04/2022  Narrative Patch Wear Time:  11 days and 0 hours (2024-01-22T20:43:37-0500 to 2024-02-02T20:50:52-0500)  Sinus. 4 Ventricular Tachycardia runs occurred, the longest was 13 beats. 3 Supraventricular Tachycardia runs occurred, the longest was 7 beats Rare premature atrial contractions. Rare premature ventricular contractions.   CT SCANS  CT CORONARY MORPH W/CTA COR W/SCORE 07/31/2022  Addendum 07/31/2022 11:44 AM ADDENDUM REPORT: 07/31/2022 11:42  EXAM: OVER-READ INTERPRETATION  CT CHEST  The following report is an over-read performed by radiologist Dr. Jackquline Skates [facility] on 07/31/2022. This over-read does not include interpretation of cardiac or coronary anatomy or pathology. The coronary CTA interpretation by the cardiologist is attached.  COMPARISON:  None.  FINDINGS: Limited view of the lung parenchyma demonstrates no suspicious nodularity. Airways are normal.  Limited view of the mediastinum demonstrates no adenopathy. Esophagus normal.  Limited view of the upper abdomen unremarkable.  Limited view of the skeleton and chest wall is unremarkable.  IMPRESSION:  No significant extracardiac findings.   Electronically Signed By: Jackquline Boxer M.D. On: 07/31/2022 11:42  Narrative HISTORY: 53 yo female coronary evaluation, EF 30-39% ischemic cardiomyopathy  EXAM: Cardiac/Coronary CTA  TECHNIQUE: The patient was scanned on a [employer].  PROTOCOL: A 120 kV prospective scan was triggered in the descending thoracic aorta at 111 HU's. Axial non-contrast 3 mm slices were carried out through the heart. The data set was analyzed on a dedicated work station and scored using the Agatson method. Gantry rotation speed was 250 msecs and collimation was .6 mm. Beta blockade and  0.8 mg of sl NTG was given. The 3D data set was reconstructed in 5% intervals of the 35-75 % of the R-R cycle. Diastolic phases were analyzed on a dedicated work station using MPR, MIP and VRT modes. The patient received 100mL OMNIPAQUE  IOHEXOL  350 MG/ML SOLN of contrast.  FINDINGS: Quality: Fair, mis-registration artifact, HR 64  Coronary calcium  score: The patient's coronary artery calcium  score is 909, which places the patient in the 99th percentile.  Coronary arteries: Normal coronary origins.  Right dominance.  Right Coronary Artery: Dominant. At least moderate 50-69% mixed proximal stenosis. Moderate 50-69% calcified distal stenosis. Minimal mixed 1-24% proximal R-PLB and R-PDA stenosis.  Left Main Coronary Artery: Calcification with minimal 1-24% stenosis. Bifurcates into the LAD and LCx arteries.  Left Anterior Descending Coronary Artery: Heavy proximal calcification with at least moderate stenosis at the D2 bifurcation (50-69%). Small D1 and larger D2 branches. The D2 branch has moderate to severe proximal stenosis (50-69%)  Left Circumflex Artery: Small LCx system with mild mixed ostial stenosis (25-49%).  Aorta: Normal size, 35 mm at the mid ascending aorta (level of the PA bifurcation) measured double oblique. Aortic atherosclerosis. No dissection.  Aortic Valve: Trileaflet. Mild calcifications.  Other findings:  Normal pulmonary vein drainage into the left atrium.  Normal left atrial appendage without a thrombus.  Dilated main pulmonary artery to 30 mm, suggestive of pulmonary hypertension.  IMPRESSION: 1. Mild to moderate multivessel CAD, CADRADS = 3. CT FFR will be performed and reported separately.  2. Coronary calcium  score of 909. This was 99th percentile for age and sex matched control.  3. Normal coronary origin with right dominance.  4. Dilated main pulmonary artery to 30 mm, suggestive of pulmonary hypertension.  5. Aortic  atherosclerosis.  Electronically Signed: By: Vinie JAYSON Maxcy M.D. On: 07/31/2022 11:22     ______________________________________________________________________________________________       Current Reported Medications:.    Current Meds  Medication Sig   albuterol  (VENTOLIN  HFA) 108 (90 Base) MCG/ACT inhaler Inhale 2 puffs into the lungs every 4 (four) hours as needed for wheezing or shortness of breath.   ALPRAZolam (XANAX) 0.5 MG tablet 1 tablet Orally once a day for 30 days As needed   amphetamine -dextroamphetamine  (ADDERALL XR) 30 MG 24 hr capsule Take 60 mg by mouth daily.   aspirin  EC 81 MG tablet Take 1 tablet (81 mg total) by mouth daily. Swallow whole.   atorvastatin  (LIPITOR ) 80 MG tablet Take 1 tablet (80 mg total) by mouth daily.   busPIRone (BUSPAR) 10 MG tablet Take by mouth.   colestipol (COLESTID) 1 g tablet TAKE 1 TABLET (1 GRAM DOSE) BY MOUTH TWICE A DAY   cyanocobalamin  (VITAMIN B12) 1000 MCG tablet Take 1,000 mcg by mouth daily.   dicyclomine (BENTYL) 20 MG tablet Take 20 mg by mouth daily as needed for spasms.   escitalopram  (LEXAPRO ) 10 MG tablet Take 10 mg  by mouth daily as needed (anxiety).   ezetimibe  (ZETIA ) 10 MG tablet Take 1 tablet (10 mg total) by mouth daily.   ferrous sulfate  325 (65 FE) MG EC tablet Take 325 mg by mouth daily as needed (for iron).   furosemide  (LASIX ) 20 MG tablet Take 1 tablet (20 mg total) by mouth daily as needed (for lower extremity edema).   gabapentin  (NEURONTIN ) 400 MG capsule Take 800 mg by mouth daily as needed (for pain).   icosapent  Ethyl (VASCEPA ) 1 g capsule Take 2 capsules (2 g total) by mouth 2 (two) times daily.   ipratropium (ATROVENT) 0.03 % nasal spray 2 sprays.   losartan  (COZAAR ) 25 MG tablet Take 0.5 tablets (12.5 mg total) by mouth daily.   metoprolol  succinate (TOPROL -XL) 50 MG 24 hr tablet Take 1 tablet (50 mg total) by mouth 2 (two) times daily. Take with or immediately following a meal.   nicotine   (NICODERM CQ  - DOSED IN MG/24 HOURS) 21 mg/24hr patch Place 1 patch (21 mg total) onto the skin daily.   nitroGLYCERIN  (NITROSTAT ) 0.4 MG SL tablet PLACE 1 TABLET UNDER THE TONGUE EVERY 5 MINUTES AS NEEDED FOR CHEST PAIN.   omeprazole (PRILOSEC) 40 MG capsule Take 40 mg by mouth every evening.   spironolactone  (ALDACTONE ) 25 MG tablet Take 1 tablet (25 mg total) by mouth daily.   ticagrelor  (BRILINTA ) 90 MG TABS tablet Take 1 tablet (90 mg total) by mouth 2 (two) times daily.   VITAMIN D, CHOLECALCIFEROL, PO Take 1 tablet by mouth daily.   zolpidem  (AMBIEN ) 10 MG tablet Take 10 mg by mouth at bedtime.   Current Facility-Administered Medications for the 01/11/24 encounter (Office Visit) with Booker Bhatnagar D, NP  Medication   lactulose  (CHRONULAC ) 10 GM/15ML solution 20 g    Physical Exam:    VS:  BP 110/68   Pulse 84   Ht 5' 2 (1.575 m)   Wt 174 lb 12.8 oz (79.3 kg)   SpO2 98%   BMI 31.97 kg/m    Wt Readings from Last 3 Encounters:  01/11/24 174 lb 12.8 oz (79.3 kg)  01/10/24 172 lb (78 kg)  10/19/23 172 lb (78 kg)    GEN: Well nourished, well developed in no acute distress NECK: No JVD; No carotid bruits CARDIAC: RRR, no murmurs, rubs, gallops RESPIRATORY: Fine crackles in bilateral bases ABDOMEN: Soft, non-tender, non-distended EXTREMITIES:  No edema; No acute deformity     Asessement and Plan:.    Shortness of breath: Patient reports that since her bypass surgery she has had intermittent problems with swallowing and a sensation of choking, CT in the ED was overall unrevealing.  She reports that she has had problems with intermittent shortness of breath and feeling unable to pull air into her lungs. CT in 04/2023 indicated minimal emphysema and diffuse bilateral bronchial wall thickening, very extensive fine centrilobular nodularity throughout the lungs, consistent with smoking-related respiratory bronchiolitis.  Question if this is possibly related to emphysema, will check chest  CT.  She does have some fine crackles in the bases, appears euvolemic and well compensated on exam. Low suspicion for PE, patient is not tachycardic on exam, troponins were negative, oxygen saturations normal.  She denies any recent travel.  Notes this has been an intermittent ongoing problem.  Reviewed ED precautions. Check chest CT, will also check BNP.  CAD: S/p CABG x 4 in 4 09/2022.  S/p stenting to the RCA in 09/2023.  Patient notes intermittent spots of discomfort across  her chest at different intervals, reports very vague symptoms.  She denies any chest pain, pressure or tightness with exertion.  She denies discomfort similar to her prior angina.  On further discussion she feels it is likely related to history of acid reflux or increased coughing with shortness of breath as noted above.  Patient will continue to monitor symptoms and notify the office of symptoms similar to her prior angina.  Reviewed ED precautions.  Continue aspirin  81 mg daily, Lipitor  80 mg daily, Zetia  10 mg daily, Lasix  20 mg as needed, losartan  12.5 mg daily, metoprolol  succinate 50 mg twice daily and spironolactone  12.5 mg daily and Brilinta  90 mg twice daily.  HFrEF: Echo on 10/10/2023 indicated LVEF of 40 to 45%, diastolic parameters were indeterminate, RV systolic function and size was normal, no significant valvular abnormalities.  Patient today appears euvolemic and well compensated on exam, she reports ongoing shortness of breath that intermittently worsens, feels as if she is unable to pull an air.  She denies any lower extremity edema, orthopnea or PND.  Notes mucus production when she lays down and coughs.  She has fine crackles in bilateral bases.  Patient reports that she has not taken any Lasix , encouraged to take 20 mg for the next 2 days to see if any improvement.  Checking chest CT and BMET with BNP as noted above.    Disposition: F/u with Dr. Verlin on July 31 as scheduled.   Signed, Joaquim Tolen D Laszlo Ellerby, NP

## 2024-01-11 NOTE — Patient Instructions (Signed)
 Medication Instructions:  Take Lasix  20 mg when you get home and tomorrow *If you need a refill on your cardiac medications before your next appointment, please call your pharmacy*  Lab Work: Tomorrow we are going to need BNP and BMet If you have labs (blood work) drawn today and your tests are completely normal, you will receive your results only by: MyChart Message (if you have MyChart) OR A paper copy in the mail If you have any lab test that is abnormal or we need to change your treatment, we will call you to review the results.  Testing/Procedures: We are going to order CT-scan of the chest  Follow-Up: At [facility], you and your health needs are our priority.  As part of our continuing mission to provide you with exceptional heart care, our providers are all part of one team.  This team includes your primary Cardiologist (physician) and Advanced Practice Providers or APPs (Physician Assistants and Nurse Practitioners) who all work together to provide you with the care you need, when you need it.  Your next appointment:   Keep as scheduled  We recommend signing up for the patient portal called MyChart.  Sign up information is provided on this After Visit Summary.  MyChart is used to connect with patients for Virtual Visits (Telemedicine).  Patients are able to view lab/test results, encounter notes, upcoming appointments, etc.  Non-urgent messages can be sent to your provider as well.   To learn more about what you can do with MyChart, go to [email or web address].

## 2024-01-12 ENCOUNTER — Encounter: Payer: Self-pay | Admitting: Cardiology

## 2024-01-13 ENCOUNTER — Ambulatory Visit: Payer: Self-pay | Admitting: *Deleted

## 2024-01-13 LAB — LIPID PANEL
Chol/HDL Ratio: 2.4 ratio (ref 0.0–4.4)
Cholesterol, Total: 129 mg/dL (ref 100–199)
HDL: 53 mg/dL (ref >39)
LDL Chol Calc (NIH): 46 mg/dL (ref 0–99)
Triglycerides: 185 mg/dL — ABNORMAL HIGH (ref 0–149)
VLDL Cholesterol Cal: 30 mg/dL (ref 5–40)

## 2024-01-13 LAB — BASIC METABOLIC PANEL WITH GFR
BUN/Creatinine Ratio: 9 (ref 9–23)
BUN: 6 mg/dL (ref 6–24)
CO2: 21 mmol/L (ref 20–29)
Calcium: 9.1 mg/dL (ref 8.7–10.2)
Chloride: 103 mmol/L (ref 96–106)
Creatinine, Ser: 0.7 mg/dL (ref 0.57–1.00)
Glucose: 94 mg/dL (ref 70–99)
Potassium: 3.7 mmol/L (ref 3.5–5.2)
Sodium: 142 mmol/L (ref 134–144)
eGFR: 103 mL/min/{1.73_m2} (ref >59)

## 2024-01-13 LAB — BRAIN NATRIURETIC PEPTIDE: BNP: 80.1 pg/mL (ref 0.0–100.0)

## 2024-01-16 ENCOUNTER — Ambulatory Visit: Payer: Self-pay | Admitting: Cardiology

## 2024-01-26 NOTE — Telephone Encounter (Signed)
 Left message to call back

## 2024-01-26 NOTE — Telephone Encounter (Signed)
-----   Message from Katlyn D West sent at 01/16/2024  7:08 PM EDT ----- Please let [name] know that her kidney function is normal and her electrolytes are normal. Her BNP was normal, indicating she is not holding extra fluid. Overall good results, continue current  medications and follow up as planned.  ----- Message ----- From: Rebecka Memos Lab Results In Sent: 01/13/2024   6:37 AM EDT To: Katlyn D West, NP

## 2024-02-02 NOTE — Telephone Encounter (Signed)
 Left message to call back

## 2024-02-09 NOTE — Telephone Encounter (Signed)
 Called patient advised of below they verbalized understanding.

## 2024-02-09 NOTE — Telephone Encounter (Signed)
-----   Message from Katlyn D West sent at 01/16/2024  7:08 PM EDT ----- Please let Ms. Mannie know that her kidney function is normal and her electrolytes are normal. Her BNP was normal, indicating she is not holding extra fluid. Overall good results, continue current  medications and follow up as planned.  ----- Message ----- From: Rebecka Memos Lab Results In Sent: 01/13/2024   6:37 AM EDT To: Katlyn D West, NP

## 2024-02-10 ENCOUNTER — Ambulatory Visit: Admitting: Cardiovascular Disease

## 2024-02-14 ENCOUNTER — Other Ambulatory Visit: Payer: Self-pay | Admitting: Otolaryngology

## 2024-02-14 DIAGNOSIS — K219 Gastro-esophageal reflux disease without esophagitis: Secondary | ICD-10-CM

## 2024-02-27 NOTE — Progress Notes (Unsigned)
 Office Visit    Patient Name: [name] Date of Encounter: 02/29/2024  PCP:  Arloa Elsie SAUNDERS, MD   [facility]  Cardiologist:  Lonni Cash, MD  Advanced Practice Provider:  No care team member to display Electrophysiologist:  None   HPI    [name] is a 53 y.o. female with a past medical history significant for anxiety, fibromyalgia, GERD, arthritis, HLD, rheumatoid arthritis, tobacco abuse presents today for overdue follow-up appointment.  She was seen initially March 2021.  She had palpitations at home.  She felt like her heart was skipping a beat.  She also had dyspnea on exertion.  No chest pain.  She was very anxious.  She smokes 1 pack a day.  She had a normal exercise stress test March 2021.  Echo March 2021 with LVEF 50 to 55%.  No valve disease.  Cardiac monitor April 2021 with sinus rhythm, 2 short runs of VT (longest 9 beats), 3 short runs of SVT (longest 12 beats).  She is here for overdue follow-up.  She was last seen April 2021 and at that time she denied any CV symptoms.  She does have some palpitations and was having severe back pain at that time.  She was last seen 06/18/22 by myself and she told me that she had an episode of a racing heartbeat and felt so bad that she immediately reported it to a fire station.  There, they did an EKG and told her she was in atrial fibrillation and advised that she go to the ED for further evaluation.  She has had other episodes of this racing heartbeat.  She also sometimes endorses a feeling of her heart stopping and her waiting for it to restart.  She was taken off her Cardizem  but she cannot remember why.  She also states that her blood pressure has been elevated especially her diastolic blood pressure.  Reports 170/100 and 150/100.  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  She does endorse insignificant  bleeding from the stones.  This has been going on for 5 years per patient report.  She was seen by me 07/24/21, She had a significant finding on her echocardiogram which revealed a reduced EF and some wall regional motion abnormality.  A coronary CTA scan will be arranged today.  She still is having some SOB. She has felt horrible from a URI/possible bronchitis. She went to her PCP's office and they did not want to start her on anything due to her Afib and the workup that we are doing. We discussed next steps will be determined after the CT scan. She can place the zio (which she already has) after her coronary CT scan. Her BP is elevated today here in the clinic and has been at home per the patient. She thought it was due to the Eliquis  however, I explained that Eliquis  should not affect BP. We discussed increasing her Cardizem  today.  I saw her back in January 2024 and she states that she is under a lot of stress and her husband makes it worse.  She states that stress from taking care of him has started all of these health problems for her.  She is worried that she will drop dead.  She did take some magnesium  and potassium and it relieved the pressure in her chest.  She is also on aspirin  325  mg.  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  She has got her husband off financially and emotionally because he is pulling her down from a health standpoint.  She is still having chest pains and a lot of shortness of breath.  Her shortness of breath is not proportional to any of the cardiac findings as far.  We reviewed her CT scan as well as her echocardiogram again.  She states that she just is running out of oxygen.  No swelling.  Weight has been stable.  She also is short of breath when eating and bending over.  She was given a large dose of metoprolol  for the CT scan and it did not touch her heart rate.  She then needed to take a few nitroglycerin  tabs and that lowered her heart rate.  She was seen by me 09/01/22,  she shares with me that he is very fatigued. She is limiting her activity and gets out of breath easily. Scared to get on a treadmill or do any physical activity.  We have had issues receiving her ZIO monitor x 2. Once in December we never got results and now her monitor from January. She tells me she dropped off at [employer].  There has been some miscommunication.  I have asked her to contact [employer] to see what happened.  We also discussed cardiac catheterization to determine what is causing her symptoms.  Her heart rate remains elevated today.  She remains on Cardizem  for blood pressure and heart rate control.  We have started her on daily Lipitor  and we will obtain an updated lipid panel.  Cardiac cath on 09/07/2022 showed sequential 90% and 70% lesions in the LAD flanking and aneurysmal segment where a large second diagonal branch arises from.  Second diagonal branch had 70% proximal stenosis.  The right coronary artery had segmental 60% concentric mid vessel stenosis.  The RFR in the mid RCA was 0.94.  RFR of the LAD was 0.46.  Left heart filling pressures were elevated with LVEDP of 17 and pulmonary capillary wedge of 13.  Right heart pressures were normal.  She underwent elective coronary bypass grafting x 4 on 10/08/2022 without complication.  She was started on nicotine  patch per request.  She had significant volume excess.  She was eventually diuresed back to baseline.  A 6-minute walk test confirmed the need for supplemental oxygen at home.  Otherwise, uneventful hospitalization.  I saw her 10/2022, she tells me that she is having some nausea and vomiting.  She experienced this before surgery and that usually happens in the morning.  She stated in the hospital she was drinking some apple juice and was choking.  She had to get assistance right away.  She states that she is taking her omeprazole.  She also tells me that she has had a lot of pins and needle feeling in her leg and in her chest since surgery.  She  was given gabapentin  to take and she has been requiring this every few hours.  She plans to discuss this with the surgeon on Wednesday.  She does not feel much difference at this current moment but hopes to feel better in a couple of weeks once she has recovered.  She states she has not had normal bowel movement since being home and says she does not of the gallbladder they tend to be mostly bile.  We discussed adding on a KUB since she is getting her chest x-ray today that way Dr. Lucas  can look it over when she sees him.  Reports no shortness of breath nor dyspnea on exertion. Reports no chest pain, pressure, or tightness. No edema, orthopnea, PND. Reports no palpitations.   Dr. Verlin saw her August 2024 and at that time her blood pressure has been consistently over 140 at home.  Recent undergone CABG x 4 back in March 2024.  Heart rate was in the low 100s.  Denied chest pain and shortness of breath.  She was seen in the ER March 2025 with chest pain.  Troponin levels were trending down and EKG revealed no other significant abnormalities from baseline.  She underwent successful angioplasty and stenting of the dominant RCA which was not bypassed previously.  She was recommended to continue high intensity statins with goal of LDL less than 70 and recommended to continue DAPT for a year.  I saw her in April 2025, she presents with a history of cardiac issues including a recent heart attack, presents with ongoing chest pain. The patient describes the pain as sharp and similar to the pain experienced during her recent heart attack. The patient also reports a history of atypical symptoms preceding cardiac events, including stomach pain and sensations similar to heartburn.  The patient has been trying to manage her anxiety, which she describes as feeling on edge and having difficulty calming down. She has been taking Lexapro  daily and Xanax as needed, but is considering a more consistent medication  regimen to manage her anxiety levels.  The patient also reports high cholesterol and triglycerides, despite efforts to improve her diet. She has been taking Zetia  for cholesterol management and has been recommended to try Vascepa , a high-dose fish oil supplement.  The patient has been experiencing difficulty in getting appropriate care for her symptoms, with challenges in getting timely appointments with specialists and confusion over her symptoms' origins. She expresses frustration over her symptoms being dismissed as gastrointestinal issues when she believes they are cardiac-related.  Reports no shortness of breath nor dyspnea on exertion.  No edema, orthopnea, PND. Reports no palpitations.   Discussed the use of AI scribe software for clinical note transcription with the patient, who gave verbal consent to proceed.  Today, she presents with coronary artery disease whose chief complaint is aspiration and throat issues.  She experiences aspiration once or twice a week since her bypass surgery, with a persistent dry throat and frequent aspiration during sleep. An endoscopy revealed gastritis and esophagitis, and she is on omeprazole, but symptoms persist.  She has coronary artery disease and underwent bypass surgery. Concerns about the status of her bypass grafts persist. Dizziness and fatigue are ongoing issues. A previous heart attack and cardiac catheterization showed some blockages.  She experienced a fall in the shower due to dizziness, resulting in bruises but no head injury. Frequent dizziness and nausea occur, with fatigue and a feeling of being 'high' until the afternoon. She is on metoprolol  50 mg twice a day, which she believes contributes to her fatigue.  She is currently taking Lexapro  and Xanax for anxiety. Xanax helps with anxiety symptoms, particularly when feeling jittery, but Lexapro  is not perceived as effective.  Reports no shortness of breath nor dyspnea on exertion. Reports  no chest pain, pressure, or tightness. No edema, orthopnea, PND. Reports no palpitations.   Discussed the use of AI scribe software for clinical note transcription with the patient, who gave verbal consent to proceed.   Past Medical History    Past Medical History:  Diagnosis  Date   A-fib [facility])    Anxiety    Arthralgia    Asthma    Attention-deficit hyperactivity disorder, predominantly inattentive type    Back pain    Coronary artery disease    Dysrhythmia    Afib   Fear of flying    Fibromyalgia    denies having Fibromyalgia   Gastroesophageal reflux disease    HPV in female    Joint pain    Localized edema    Neck pain    OA (osteoarthritis)    Papillomavirus as the cause of diseases classified elsewhere    Primary insomnia    Pure hypercholesterolemia    Rheumatoid arthritis, seronegative, hand, unspecified laterality (HCC)    patient feels it is back related, not a true arthritis   Spondylolisthesis of lumbosacral region    Tobacco dependence    Unspecified inflammatory spondylopathy, cervical region (HCC)    Vitamin B12 deficiency anemia    Voice hoarseness    Past Surgical History:  Procedure Laterality Date   ABDOMINAL EXPOSURE N/A 06/28/2020   Procedure: ABDOMINAL EXPOSURE;  Surgeon: Gretta Lonni PARAS, MD;  Location: [facility] OR;  Service: Vascular;  Laterality: N/A;  anterior   ANTERIOR LUMBAR FUSION N/A 06/28/2020   Procedure: ANTERIOR LUMBAR INTERBODY FUSION LUMBAR FIVE-SACRAL ONE.;  Surgeon: Joshua Alm RAMAN, MD;  Location: [facility] OR;  Service: Neurosurgery;  Laterality: N/A;  anterior approach   BIOPSY  01/28/2023   Procedure: BIOPSY;  Surgeon: Kriss Estefana DEL, DO;  Location: [facility];  Service: Gastroenterology;;   BREAST ENHANCEMENT SURGERY     CARDIAC CATHETERIZATION     CARPAL TUNNEL RELEASE Left    CARPAL TUNNEL RELEASE Right 06/28/2020   Procedure: CARPAL TUNNEL RELEASE;  Surgeon: Joshua Alm RAMAN, MD;  Location: [facility] OR;  Service: Neurosurgery;   Laterality: Right;   CERVICAL SPINE SURGERY     CHOLECYSTECTOMY     CLIPPING OF ATRIAL APPENDAGE N/A 10/08/2022   Procedure: CLIPPING OF ATRIAL APPENDAGE USING ATRICURE ATRICLIP PRO2 SIZE 35;  Surgeon: Lucas Dorise POUR, MD;  Location: [facility] OR;  Service: Open Heart Surgery;  Laterality: N/A;   CORONARY ARTERY BYPASS GRAFT N/A 10/08/2022   Procedure: CORONARY ARTERY BYPASS GRAFTING (CABG) X3 USING LEFT INTERNAL MAMMARY ARTERY AND RIGHT GREATER SAPHENOUS VEIN HARVESTED ENDOSCOPICALLY;  Surgeon: Lucas Dorise POUR, MD;  Location: [facility] OR;  Service: Open Heart Surgery;  Laterality: N/A;   CORONARY PRESSURE/FFR STUDY N/A 09/07/2022   Procedure: INTRAVASCULAR PRESSURE WIRE/FFR STUDY;  Surgeon: Mady Lonni, MD;  Location: [facility] INVASIVE CV LAB;  Service: Cardiovascular;  Laterality: N/A;   CORONARY STENT INTERVENTION N/A 10/09/2023   Procedure: CORONARY STENT INTERVENTION;  Surgeon: Wendel Lurena POUR, MD;  Location: [facility] INVAS[facility] CV LAB;  Service: Cardiovascular;  Laterality: N/A;   CORONARY/GRAFT ACUTE MI REVASCULARIZATION N/A 10/09/2023   Procedure: Coronary/Graft Acute MI Revascularization;  Surgeon: Wendel Lurena POUR, MD;  Location: [facility] CV LAB;  Service: Cardiovascular;  Laterality: N/A;   ESOPHAGOGASTRODUODENOSCOPY N/A 01/28/2023   Procedure: ESOPHAGOGASTRODUODENOSCOPY (EGD);  Surgeon: Kriss Estefana DEL, DO;  Location: [facility];  Service: Gastroenterology;  Laterality: N/A;   FOOT SURGERY     LEFT HEART CATH AND CORONARY ANGIOGRAPHY N/A 10/09/2023   Procedure: LEFT HEART CATH AND CORONARY ANGIOGRAPHY;  Surgeon: Wendel Lurena POUR, MD;  Location: [facility]VAS[facility] CV LAB;  Service: Cardiovascular;  Laterality: N/A;   MANDIBLE SURGERY     MAZE N/A 10/08/2022   Procedure: PULMONARY VEIN ABLATION USING ATRICURE ISOLATOR SYNERGY ENCOMPASS OLH;  Surgeon: Lucas Dorise POUR, MD;  Location: [facility] OR;  Service: Open Heart Surgery;  Laterality: N/A;   RIGHT/LEFT HEART CATH AND CORONARY ANGIOGRAPHY N/A 09/07/2022    Procedure: RIGHT/LEFT HEART CATH AND CORONARY ANGIOGRAPHY;  Surgeon: Mady Bruckner, MD;  Location: [facility]VAS[facility] CV LAB;  Service: Cardiovascular;  Laterality: N/A;   TEE WITHOUT CARDIOVERSION N/A 10/08/2022   Procedure: TRANSESOPHAGEAL ECHOCARDIOGRAM;  Surgeon: Lucas Dorise POUR, MD;  Location[facility] OR;  Service: Open Heart Surgery;  Laterality: N/A;   TONSILLECTOMY     TUBAL LIGATION     with ablation   TYMPANOSTOMY TUBE PLACEMENT      Allergies  Allergies  Allergen Reactions   Zoloft [Sertraline] Other (See Comments)    Altered mental state     EKGs/Labs/Other Studies Reviewed:   The following studies were reviewed today:  Echocardiogram 07/20/2021  IMPRESSIONS     1. Left ventricular ejection fraction, by estimation, is 35 to 40%. The  left ventricle has moderately decreased function. The left ventricle  demonstrates regional wall motion abnormalities (see scoring  diagram/findings for description). The left  ventricular internal cavity size was mildly dilated. Left ventricular  diastolic parameters are indeterminate. Mild global hypokinesis with  moderate hypokinesis of septal wall.   2. Right ventricular systolic function is normal. The right ventricular  size is normal.   3. Left atrial size was moderately dilated.   4. The mitral valve is normal in structure. Mild to moderate mitral valve  regurgitation. No evidence of mitral stenosis.   5. The aortic valve is grossly normal. There is mild calcification of the  aortic valve. Aortic valve regurgitation is not visualized. Aortic valve  sclerosis is present, with no evidence of aortic valve stenosis.   6. The inferior vena cava is normal in size with greater than 50%  respiratory variability, suggesting right atrial pressure of 3 mmHg.   7. Cannot exclude a small PFO.   Comparison(s): Prior images reviewed side by side. Changes from prior  study are noted. Prior images with normal EF, maybe minimal abnormal  septal  motion. EF reduced and septal motion worse compared to prior.   Conclusion(s)/Recommendation(s): Significantly changed compared to prior,  will contact Orren Fabry and Dr. Verlin.   FINDINGS   Left Ventricle: Left ventricular ejection fraction, by estimation, is 35  to 40%. The left ventricle has moderately decreased function. The left  ventricle demonstrates regional wall motion abnormalities. Moderate  hypokinesis of the left ventricular,  entire anteroseptal wall and inferoseptal wall. Global longitudinal strain  performed but not reported based on interpreter judgement due to  suboptimal tracking. 3D left ventricular ejection fraction analysis  performed but not reported based on  interpreter judgement due to suboptimal tracking. The left ventricular  internal cavity size was mildly dilated. There is no left ventricular  hypertrophy. Left ventricular diastolic parameters are indeterminate.     LV Wall Scoring:  Mild global hypokinesis with moderate hypokinesis of septal wall.   Right Ventricle: The right ventricular size is normal. No increase in  right ventricular wall thickness. Right ventricular systolic function is  normal.   Left Atrium: Left atrial size was moderately dilated.   Right Atrium: Right atrial size was normal in size.   Pericardium: There is no evidence of pericardial effusion.   Mitral Valve: The mitral valve is normal in structure. There is mild  thickening of the mitral valve leaflet(s). Mild to moderate mitral valve  regurgitation. No evidence of mitral valve stenosis.  Tricuspid Valve: The tricuspid valve is normal in structure. Tricuspid  valve regurgitation is trivial. No evidence of tricuspid stenosis.   Aortic Valve: The aortic valve is grossly normal. There is mild  calcification of the aortic valve. Aortic valve regurgitation is not  visualized. Aortic valve sclerosis is present, with no evidence of aortic  valve stenosis.   Pulmonic  Valve: The pulmonic valve was grossly normal. Pulmonic valve  regurgitation is trivial. No evidence of pulmonic stenosis.   Aorta: The aortic root, ascending aorta, aortic arch and descending aorta  are all structurally normal, with no evidence of dilitation or  obstruction.   Venous: The inferior vena cava is normal in size with greater than 50%  respiratory variability, suggesting right atrial pressure of 3 mmHg.   IAS/Shunts: Cannot exclude a small PFO.    Echocardiogram 09/28/19 IMPRESSIONS     1. Left ventricular ejection fraction, by estimation, is 50 to 55%. The  left ventricle has low normal function. The left ventricle has no regional  wall motion abnormalities. The left ventricular internal cavity size was  mildly dilated. Left ventricular  diastolic parameters are indeterminate. The average left ventricular  global longitudinal strain is -17.6 %.   2. Right ventricular systolic function is normal. The right ventricular  size is normal. There is normal pulmonary artery systolic pressure.   3. The mitral valve is normal in structure. Trivial mitral valve  regurgitation. No evidence of mitral stenosis.   4. The aortic valve is tricuspid. Aortic valve regurgitation is not  visualized. No aortic stenosis is present.   5. The inferior vena cava is normal in size with greater than 50%  respiratory variability, suggesting right atrial pressure of 3 mmHg.   FINDINGS   Left Ventricle: Left ventricular ejection fraction, by estimation, is 50  to 55%. The left ventricle has low normal function. The left ventricle has  no regional wall motion abnormalities. The average left ventricular global  longitudinal strain is -17.6  %. The left ventricular internal cavity size was mildly dilated. There is  no left ventricular hypertrophy. Left ventricular diastolic parameters are  indeterminate.   Right Ventricle: The right ventricular size is normal. No increase in  right ventricular  wall thickness. Right ventricular systolic function is  normal. There is normal pulmonary artery systolic pressure. The tricuspid  regurgitant velocity is 2.03 m/s, and   with an assumed right atrial pressure of 3 mmHg, the estimated right  ventricular systolic pressure is 19.5 mmHg.   Left Atrium: Left atrial size was normal in size.   Right Atrium: Right atrial size was normal in size.   Pericardium: There is no evidence of pericardial effusion.   Mitral Valve: The mitral valve is normal in structure. Normal mobility of  the mitral valve leaflets. Trivial mitral valve regurgitation. No evidence  of mitral valve stenosis.   Tricuspid Valve: The tricuspid valve is normal in structure. Tricuspid  valve regurgitation is trivial. No evidence of tricuspid stenosis.   Aortic Valve: The aortic valve is tricuspid. Aortic valve regurgitation is  not visualized. No aortic stenosis is present.   Pulmonic Valve: The pulmonic valve was normal in structure. Pulmonic valve  regurgitation is not visualized. No evidence of pulmonic stenosis.   Aorta: The aortic root is normal in size and structure.   Venous: The inferior vena cava is normal in size with greater than 50%  respiratory variability, suggesting right atrial pressure of 3 mmHg.   IAS/Shunts: No atrial level shunt  detected by color flow Doppler.   Cardiac monitor 10/01/19  Sinus rhythm 2 episodes of ventricular tachycardia with longest at 9 beats.  Rare PVCs 3 short runs of supraventricular tachycardia, longest 12 beats  EKG:  EKG is  ordered today.  The ekg ordered today demonstrates sinus tachycardia rate 101 bpm  Recent Labs: 10/19/2023: ALT 28; Magnesium  2.2 01/10/2024: Hemoglobin 13.4; Platelets 304 01/12/2024: BNP 80.1; BUN 6; Creatinine, Ser 0.70; Potassium 3.7; Sodium 142  Recent Lipid Panel    Component Value Date/Time   CHOL 129 01/12/2024 1338   TRIG 185 (H) 01/12/2024 1338   HDL 53 01/12/2024 1338   CHOLHDL 2.4  01/12/2024 1338   CHOLHDL 4.3 10/10/2023 0345   VLDL 69 (H) 10/10/2023 0345   LDLCALC 46 01/12/2024 1338    Risk Assessment/Calculations:   CHA2DS2-VASc Score =     This indicates a  % annual risk of stroke. The patient's score is based upon:        Home Medications   Current Meds  Medication Sig   albuterol  (VENTOLIN  HFA) 108 (90 Base) MCG/ACT inhaler Inhale 2 puffs into the lungs every 4 (four) hours as needed for wheezing or shortness of breath.   ALPRAZolam (XANAX) 0.5 MG tablet 1 tablet Orally once a day for 30 days As needed   amphetamine -dextroamphetamine  (ADDERALL XR) 30 MG 24 hr capsule Take 60 mg by mouth daily.   Ascorbic Acid  500 MG CAPS as directed Orally once a day; Duration: 90 days   aspirin  EC 81 MG tablet Take 1 tablet (81 mg total) by mouth daily. Swallow whole.   atorvastatin  (LIPITOR ) 80 MG tablet Take 1 tablet (80 mg total) by mouth daily.   busPIRone (BUSPAR) 10 MG tablet Take by mouth.   cyanocobalamin  (VITAMIN B12) 1000 MCG tablet Take 1,000 mcg by mouth daily.   escitalopram  (LEXAPRO ) 10 MG tablet Take 10 mg by mouth daily as needed (anxiety). (Patient taking differently: Take 10 mg by mouth daily.)   ezetimibe  (ZETIA ) 10 MG tablet Take 1 tablet (10 mg total) by mouth daily.   ferrous sulfate  325 (65 FE) MG EC tablet Take 325 mg by mouth daily as needed (for iron).   furosemide  (LASIX ) 20 MG tablet Take 1 tablet (20 mg total) by mouth daily as needed (for lower extremity edema).   gabapentin  (NEURONTIN ) 400 MG capsule Take 800 mg by mouth daily as needed (for pain).   icosapent  Ethyl (VASCEPA ) 1 g capsule Take 2 capsules (2 g total) by mouth 2 (two) times daily.   ipratropium (ATROVENT) 0.03 % nasal spray 2 sprays.   losartan  (COZAAR ) 25 MG tablet Take 0.5 tablets (12.5 mg total) by mouth daily.   Magnesium  300 MG CAPS daily as needed.   metoprolol  succinate (TOPROL -XL) 50 MG 24 hr tablet Take 1 tablet (50 mg total) by mouth 2 (two) times daily. Take  with or immediately following a meal.   nicotine  (NICODERM CQ  - DOSED IN MG/24 HOURS) 21 mg/24hr patch Place 1 patch (21 mg total) onto the skin daily.   nitroGLYCERIN  (NITROSTAT ) 0.4 MG SL tablet PLACE 1 TABLET UNDER THE TONGUE EVERY 5 MINUTES AS NEEDED FOR CHEST PAIN.   omeprazole (PRILOSEC) 40 MG capsule Take 40 mg by mouth every evening.   podofilox (CONDYLOX) 0.5 % external solution Apply 1 Application topically 2 (two) times daily.   spironolactone  (ALDACTONE ) 25 MG tablet Take 1 tablet (25 mg total) by mouth daily.   ticagrelor  (BRILINTA ) 90 MG TABS tablet Take  1 tablet (90 mg total) by mouth 2 (two) times daily.   VITAMIN D, CHOLECALCIFEROL, PO Take 1 tablet by mouth daily.   zolpidem  (AMBIEN ) 10 MG tablet Take 10 mg by mouth at bedtime.   Current Facility-Administered Medications for the 02/29/24 encounter (Office Visit) with Lucien Orren SAILOR, PA-C  Medication   lactulose  (CHRONULAC ) 10 GM/15ML solution 20 g     Review of Systems      All other systems reviewed and are otherwise negative except as noted above.  Physical Exam    VS:  BP 132/76 (BP Location: Left Arm)   Pulse 81   Ht 5' 2.75 (1.594 m)   Wt 175 lb 9.6 oz (79.7 kg)   SpO2 94%   BMI 31.35 kg/m  , BMI Body mass index is 31.35 kg/m.  Wt Readings from Last 3 Encounters:  02/29/24 175 lb 9.6 oz (79.7 kg)  01/11/24 174 lb 12.8 oz (79.3 kg)  01/10/24 172 lb (78 kg)     GEN: Well nourished, well developed, in no acute distress. HEENT: normal. Neck: Supple, no JVD, carotid bruits, or masses. Cardiac: RRR, no murmurs, rubs, or gallops. No clubbing, cyanosis, edema.  Radials/PT 2+ and equal bilaterally.  Respiratory:  Respirations regular and unlabored, RLL rhonchi. GI: Soft, nontender, nondistended. MS: No deformity or atrophy. Skin: Warm and dry, no rash. Neuro:  Strength and sensation are intact. Psych: Normal affect.  Assessment & Plan     Aspiration with chronic cough and risk of aspiration  pneumonia Chronic aspiration. Risk of aspiration pneumonia from aspirated contents. - Refer to pulmonary specialist for bronchoscopy to evaluate structural issues in bronchus. - Send referral to Dr. Brenna or Dr. Shelah for bronchoscopy and pulmonary evaluation.  Dysphagia and chronic throat dryness post-bypass surgery Chronic throat dryness and dysphagia possibly related to intubation during surgery. - Include evaluation of throat dryness and dysphagia in pulmonary referral.  Chronic dizziness and fatigue Chronic dizziness and fatigue possibly related to cardiac condition or medication side effects. Metoprolol  may contribute to fatigue. Occasional low oxygen levels. - Include evaluation of dizziness and fatigue in pulmonary referral. - Consider pulmonary function tests to assess oxygenation issues. - Smoking cessation encouraged  Coronary artery disease with prior bypass and stent Coronary artery disease with prior bypass and stent. Recent catheterization showed patent bypasses and stents, with some chronic blockages. Experiences fatigue and exertional dyspnea. - Monitor coronary artery disease with routine follow-up and lipid management. - Continue current cardiac medications including metoprolol . - Discussed the importance of monitoring cholesterol and triglycerides.  Hypertriglyceridemia Triglycerides previously reduced with Vascepa . Last level was 185. LDL and A1c well controlled. - Order lipid panel to recheck triglyceride levels. - Continue Vascepa  for triglyceride management.  Gastritis and esophagitis Diagnosed with gastritis and esophagitis post-bypass surgery. Symptoms persist despite omeprazole. - Continue omeprazole for gastritis and esophagitis management. - She also has GI follow-up  Generalized anxiety disorder Ongoing anxiety managed with Lexapro  and Xanax. Lexapro  not effective, Xanax provides relief for acute anxiety. - Consider discussing alternative SSRIs with  primary care if Lexapro  remains ineffective. - Continue Xanax as needed for acute anxiety episodes.  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 Possible nerve damage during vein harvest    Disposition: Follow up 6 months with Lonni Cash, MD or APP.  Signed, Orren SAILOR Lucien, PA-C 02/29/2024, 5:08 PM [facility]

## 2024-02-29 ENCOUNTER — Encounter: Payer: Self-pay | Admitting: Physician Assistant

## 2024-02-29 ENCOUNTER — Ambulatory Visit: Attending: Physician Assistant | Admitting: Physician Assistant

## 2024-02-29 VITALS — BP 132/76 | HR 81 | Ht 62.75 in | Wt 175.6 lb

## 2024-02-29 DIAGNOSIS — R002 Palpitations: Secondary | ICD-10-CM

## 2024-02-29 DIAGNOSIS — T17908D Unspecified foreign body in respiratory tract, part unspecified causing other injury, subsequent encounter: Secondary | ICD-10-CM

## 2024-02-29 DIAGNOSIS — R0602 Shortness of breath: Secondary | ICD-10-CM

## 2024-02-29 DIAGNOSIS — I2510 Atherosclerotic heart disease of native coronary artery without angina pectoris: Secondary | ICD-10-CM | POA: Diagnosis not present

## 2024-02-29 DIAGNOSIS — I5020 Unspecified systolic (congestive) heart failure: Secondary | ICD-10-CM

## 2024-02-29 DIAGNOSIS — I493 Ventricular premature depolarization: Secondary | ICD-10-CM

## 2024-02-29 DIAGNOSIS — I10 Essential (primary) hypertension: Secondary | ICD-10-CM

## 2024-02-29 DIAGNOSIS — I471 Supraventricular tachycardia, unspecified: Secondary | ICD-10-CM

## 2024-02-29 DIAGNOSIS — R079 Chest pain, unspecified: Secondary | ICD-10-CM

## 2024-02-29 DIAGNOSIS — I255 Ischemic cardiomyopathy: Secondary | ICD-10-CM

## 2024-02-29 DIAGNOSIS — T17908A Unspecified foreign body in respiratory tract, part unspecified causing other injury, initial encounter: Secondary | ICD-10-CM

## 2024-02-29 NOTE — Patient Instructions (Signed)
 Medication Instructions:  Your physician recommends that you continue on your current medications as directed. Please refer to the Current Medication list given to you today.  *If you need a refill on your cardiac medications before your next appointment, please call your pharmacy*  Lab Work: TODAY: LIPIDS If you have labs (blood work) drawn today and your tests are completely normal, you will receive your results only by: MyChart Message (if you have MyChart) OR A paper copy in the mail If you have any lab test that is abnormal or we need to change your treatment, we will call you to review the results.  Testing/Procedures: NONE  Follow-Up: At [facility], you and your health needs are our priority.  As part of our continuing mission to provide you with exceptional heart care, our providers are all part of one team.  This team includes your primary Cardiologist (physician) and Advanced Practice Providers or APPs (Physician Assistants and Nurse Practitioners) who all work together to provide you with the care you need, when you need it.  Your next appointment:   6 month(s)  Provider:   Lonni Cash, MD   We recommend signing up for the patient portal called MyChart.  Sign up information is provided on this After Visit Summary.  MyChart is used to connect with patients for Virtual Visits (Telemedicine).  Patients are able to view lab/test results, encounter notes, upcoming appointments, etc.  Non-urgent messages can be sent to your provider as well.   To learn more about what you can do with MyChart, go to [email or web address].   Other Instructions A REFERRED WAS PLACED FOR PULMONOLOGY

## 2024-03-01 LAB — LIPID PANEL
Chol/HDL Ratio: 2.5 ratio (ref 0.0–4.4)
Cholesterol, Total: 113 mg/dL (ref 100–199)
HDL: 45 mg/dL (ref >39)
LDL Chol Calc (NIH): 37 mg/dL (ref 0–99)
Triglycerides: 192 mg/dL — ABNORMAL HIGH (ref 0–149)
VLDL Cholesterol Cal: 31 mg/dL (ref 5–40)

## 2024-03-02 ENCOUNTER — Ambulatory Visit: Payer: Self-pay | Admitting: Physician Assistant

## 2024-03-02 NOTE — Telephone Encounter (Signed)
 Spoke with patient and reviewed labs results who states that she would like to try the fenofibrate option to see if the works. I made her aware that I would let Orren and she verbalized understanding and thanked me for the call.

## 2024-03-16 MED ORDER — FENOFIBRATE 160 MG PO TABS: 160.0000 mg | ORAL_TABLET | Freq: Every day | ORAL | 3 refills | Status: DC

## 2024-03-16 NOTE — Telephone Encounter (Signed)
 Per Orren is okay to start patient on fenofibrate  160 mg daily. I have sent a new prescription to  patient's pharmacy. I will send message to pt's active MyChart for the update.

## 2024-03-21 ENCOUNTER — Emergency Department (HOSPITAL_COMMUNITY)
Admission: EM | Admit: 2024-03-21 | Discharge: 2024-03-21 | Disposition: A | Discharge status: Home / Self Care | Attending: Emergency Medicine | Admitting: Emergency Medicine

## 2024-03-21 ENCOUNTER — Emergency Department (HOSPITAL_COMMUNITY)

## 2024-03-21 ENCOUNTER — Telehealth: Payer: Self-pay | Admitting: Cardiovascular Disease

## 2024-03-21 DIAGNOSIS — Z951 Presence of aortocoronary bypass graft: Secondary | ICD-10-CM | POA: Insufficient documentation

## 2024-03-21 DIAGNOSIS — I4891 Unspecified atrial fibrillation: Secondary | ICD-10-CM | POA: Insufficient documentation

## 2024-03-21 DIAGNOSIS — F17210 Nicotine dependence, cigarettes, uncomplicated: Secondary | ICD-10-CM | POA: Diagnosis not present

## 2024-03-21 DIAGNOSIS — Z7982 Long term (current) use of aspirin: Secondary | ICD-10-CM | POA: Insufficient documentation

## 2024-03-21 DIAGNOSIS — J45909 Unspecified asthma, uncomplicated: Secondary | ICD-10-CM | POA: Insufficient documentation

## 2024-03-21 DIAGNOSIS — I5020 Unspecified systolic (congestive) heart failure: Secondary | ICD-10-CM | POA: Diagnosis not present

## 2024-03-21 DIAGNOSIS — R1013 Epigastric pain: Secondary | ICD-10-CM | POA: Insufficient documentation

## 2024-03-21 LAB — CBC WITH DIFFERENTIAL/PLATELET
Abs Immature Granulocytes: 0.02 K/uL (ref 0.00–0.07)
Basophils Absolute: 0 K/uL (ref 0.0–0.1)
Basophils Relative: 1 %
Eosinophils Absolute: 0.1 K/uL (ref 0.0–0.5)
Eosinophils Relative: 1 %
HCT: 42.3 % (ref 36.0–46.0)
Hemoglobin: 14.6 g/dL (ref 12.0–15.0)
Immature Granulocytes: 0 %
Lymphocytes Relative: 13 %
Lymphs Abs: 1 K/uL (ref 0.7–4.0)
MCH: 32.8 pg (ref 26.0–34.0)
MCHC: 34.5 g/dL (ref 30.0–36.0)
MCV: 95.1 fL (ref 80.0–100.0)
Monocytes Absolute: 0.3 K/uL (ref 0.1–1.0)
Monocytes Relative: 4 %
Neutro Abs: 6.3 K/uL (ref 1.7–7.7)
Neutrophils Relative %: 81 %
Platelets: 212 K/uL (ref 150–400)
RBC: 4.45 MIL/uL (ref 3.87–5.11)
RDW: 14.8 % (ref 11.5–15.5)
WBC: 7.8 K/uL (ref 4.0–10.5)
nRBC: 0 % (ref 0.0–0.2)

## 2024-03-21 LAB — I-STAT CHEM 8, ED
BUN: 13 mg/dL (ref 6–20)
Calcium, Ion: 1.03 mmol/L — ABNORMAL LOW (ref 1.15–1.40)
Chloride: 98 mmol/L (ref 98–111)
Creatinine, Ser: 0.7 mg/dL (ref 0.44–1.00)
Glucose, Bld: 120 mg/dL — ABNORMAL HIGH (ref 70–99)
HCT: 43 % (ref 36.0–46.0)
Hemoglobin: 14.6 g/dL (ref 12.0–15.0)
Potassium: 3.2 mmol/L — ABNORMAL LOW (ref 3.5–5.1)
Sodium: 138 mmol/L (ref 135–145)
TCO2: 27 mmol/L (ref 22–32)

## 2024-03-21 LAB — TROPONIN I (HIGH SENSITIVITY)
Troponin I (High Sensitivity): 12 ng/L (ref <18)
Troponin I (High Sensitivity): 10 ng/L (ref <18)

## 2024-03-21 LAB — COMPREHENSIVE METABOLIC PANEL WITH GFR
ALT: 29 U/L (ref 0–44)
AST: 42 U/L — ABNORMAL HIGH (ref 15–41)
Albumin: 3.8 g/dL (ref 3.5–5.0)
Alkaline Phosphatase: 82 U/L (ref 38–126)
Anion gap: 14 (ref 5–15)
BUN: 12 mg/dL (ref 6–20)
CO2: 25 mmol/L (ref 22–32)
Calcium: 8.5 mg/dL — ABNORMAL LOW (ref 8.9–10.3)
Chloride: 99 mmol/L (ref 98–111)
Creatinine, Ser: 0.7 mg/dL (ref 0.44–1.00)
GFR, Estimated: >60 mL/min (ref >60)
Glucose, Bld: 122 mg/dL — ABNORMAL HIGH (ref 70–99)
Potassium: 3.2 mmol/L — ABNORMAL LOW (ref 3.5–5.1)
Sodium: 138 mmol/L (ref 135–145)
Total Bilirubin: 1.4 mg/dL — ABNORMAL HIGH (ref 0.0–1.2)
Total Protein: 6.4 g/dL — ABNORMAL LOW (ref 6.5–8.1)

## 2024-03-21 MED ORDER — IBUPROFEN 400 MG PO TABS
400.0000 mg | ORAL_TABLET | Freq: Once | ORAL | Status: AC | PRN
  Administered 2024-03-21: 400 mg via ORAL
  Filled 2024-03-21: qty 1

## 2024-03-21 MED ORDER — SODIUM CHLORIDE 0.9 % IV BOLUS
1000.0000 mL | Freq: Once | INTRAVENOUS | Status: AC
  Administered 2024-03-21: 1000 mL via INTRAVENOUS

## 2024-03-21 MED ORDER — PANTOPRAZOLE SODIUM 40 MG IV SOLR
40.0000 mg | Freq: Once | INTRAVENOUS | Status: AC
  Administered 2024-03-21: 40 mg via INTRAVENOUS
  Filled 2024-03-21: qty 10

## 2024-03-21 MED ORDER — IOHEXOL 350 MG/ML SOLN
75.0000 mL | Freq: Once | INTRAVENOUS | Status: AC | PRN
  Administered 2024-03-21: 75 mL via INTRAVENOUS

## 2024-03-21 MED ORDER — POTASSIUM CHLORIDE CRYS ER 20 MEQ PO TBCR
40.0000 meq | EXTENDED_RELEASE_TABLET | Freq: Once | ORAL | Status: AC
  Administered 2024-03-21: 40 meq via ORAL
  Filled 2024-03-21: qty 2

## 2024-03-21 NOTE — Telephone Encounter (Signed)
 Pt c/o BP issue: STAT if pt c/o blurred vision, one-sided weakness or slurred speech.  STAT if BP is GREATER than 180/120 TODAY.  STAT if BP is LESS than 90/60 and SYMPTOMATIC TODAY  1. What is your BP concern?   Patient is concerned her BP is all over the place  2. Have you taken any BP medication today?  Yes  3. What are your last 5 BP readings?  184/118 when she went to ER 139/101  HR 113 (within the past hour)  4. Are you having any other symptoms (ex. Dizziness, headache, blurred vision, passed out)?   Jittery, shaky  Patient stated she is in the hospital and her BP has been all over the place. Patient stated they only gave her an EKG and is concerned she is being released and not having her heart issue addressed.  Patient noted her troponin levels have been 10 and 12.  Patient noted her ER doctor is Dr. Mannie.

## 2024-03-21 NOTE — Discharge Instructions (Addendum)
 While you are in the emergency room, your blood work done to look for signs of injury to your heart or problems in your abdomen.  These tests were normal.  At this time, I do not have a clear cause for your symptoms, but overall your workup was very reassuring.  Please follow-up with your primary care doctor.  Continue taking all medications as prescribed.

## 2024-03-21 NOTE — ED Provider Notes (Addendum)
 [place] EMERGENCY DEPARTMENT AT [facility] Provider Note  CSN: [id number] Arrival date & time: 03/21/24 1131  Chief Complaint(s) Palpitations and Blood Pressure Fluctuations  HPI [name] is a 53 y.o. female here today for palpitations, and epigastric pain.  Patient also says that her blood pressure has been all over the place.  She has a history of bypass previously.  Symptoms began this morning.   Past Medical History Past Medical History:  Diagnosis Date   A-fib (HCC)    Anxiety    Arthralgia    Asthma    Attention-deficit hyperactivity disorder, predominantly inattentive type    Back pain    Coronary artery disease    Dysrhythmia    Afib   Fear of flying    Fibromyalgia    denies having Fibromyalgia   Gastroesophageal reflux disease    HPV in female    Joint pain    Localized edema    Neck pain    OA (osteoarthritis)    Papillomavirus as the cause of diseases classified elsewhere    Primary insomnia    Pure hypercholesterolemia    Rheumatoid arthritis, seronegative, hand, unspecified laterality (HCC)    patient feels it is back related, not a true arthritis   Spondylolisthesis of lumbosacral region    Tobacco dependence    Unspecified inflammatory spondylopathy, cervical region (HCC)    Vitamin B12 deficiency anemia    Voice hoarseness    Patient Active Problem List   Diagnosis Date Noted   Heart failure with mildly reduced ejection fraction (HFmrEF, 41-49%) (HCC) 10/11/2023   Tobacco use 10/11/2023   Hyperlipidemia 10/11/2023   Acute ST elevation myocardial infarction (STEMI) (HCC) 10/09/2023   STEMI (ST elevation myocardial infarction) (HCC) 10/09/2023   Left upper quadrant abdominal pain 01/26/2023   S/P CABG x 4 10/08/2022   Cardiomyopathy (HCC) 09/07/2022   Abnormal cardiac CT angiography 09/07/2022   S/P lumbar fusion 06/28/2020   Chronic back pain 06/25/2020   Polyarthralgia 01/05/2013   Myofascial pain dysfunction syndrome  08/28/2011   Home Medication(s) Prior to Admission medications   Medication Sig Start Date End Date Taking? Authorizing Provider  albuterol  (VENTOLIN  HFA) 108 (90 Base) MCG/ACT inhaler Inhale 2 puffs into the lungs every 4 (four) hours as needed for wheezing or shortness of breath. 10/15/22   Roddenberry, Myron G, PA-C  ALPRAZolam (XANAX) 0.5 MG tablet 1 tablet Orally once a day for 30 days As needed 10/15/23   [provider]  amphetamine -dextroamphetamine  (ADDERALL XR) 30 MG 24 hr capsule Take 60 mg by mouth daily.    [provider]  Ascorbic Acid  500 MG CAPS as directed Orally once a day; Duration: 90 days    [provider]  aspirin  EC 81 MG tablet Take 1 tablet (81 mg total) by mouth daily. Swallow whole. 01/03/24   Verlin Lonni BIRCH, MD  aspirin -acetaminophen -caffeine (EXCEDRIN MIGRAINE) 250-250-65 MG tablet Take 2 tablets by mouth daily as needed.    [provider]  atorvastatin  (LIPITOR ) 80 MG tablet Take 1 tablet (80 mg total) by mouth daily. 09/06/23   Verlin Lonni BIRCH, MD  busPIRone (BUSPAR) 10 MG tablet Take by mouth. 08/30/23   [provider]  cholecalciferol (VITAMIN D3) 25 MCG (1000 UNIT) tablet Take 1,000 Units by mouth daily. 03/07/24   [provider]  colestipol (COLESTID) 1 g tablet TAKE 1 TABLET (1 GRAM DOSE) BY MOUTH TWICE A DAY Patient not taking: Reported on 02/29/2024    [provider]  cyanocobalamin  (VITAMIN B12) 1000 MCG tablet Take 1,000 mcg by mouth daily.    [provider]  dicyclomine (BENTYL) 20 MG tablet Take 20 mg by mouth daily as needed for spasms. Patient not taking: Reported on 02/29/2024 04/01/23   [provider]  escitalopram  (LEXAPRO ) 10 MG tablet Take 10 mg by mouth daily as needed (anxiety). Patient taking differently: Take 10 mg by mouth daily.    [provider]  ezetimibe  (ZETIA ) 10 MG tablet Take 1 tablet (10 mg total) by mouth daily. 01/11/24    Verlin Lonni BIRCH, MD  fenofibrate  160 MG tablet Take 1 tablet (160 mg total) by mouth daily. 03/16/24   Lucien Orren SAILOR, PA-C  ferrous sulfate  325 (65 FE) MG EC tablet Take 325 mg by mouth daily as needed (for iron).    [provider]  furosemide  (LASIX ) 20 MG tablet Take 1 tablet (20 mg total) by mouth daily as needed (for lower extremity edema). 10/11/23   Sheron Lorette GRADE, PA-C  gabapentin  (NEURONTIN ) 400 MG capsule Take 800 mg by mouth daily as needed (for pain).    [provider]  icosapent  Ethyl (VASCEPA ) 1 g capsule Take 2 capsules (2 g total) by mouth 2 (two) times daily. 10/19/23   Lucien Orren SAILOR, PA-C  ipratropium (ATROVENT) 0.03 % nasal spray 2 sprays.    [provider]  losartan  (COZAAR ) 25 MG tablet Take 0.5 tablets (12.5 mg total) by mouth daily. 01/03/24   Verlin Lonni BIRCH, MD  Magnesium  300 MG CAPS daily as needed.    [provider]  metoprolol  succinate (TOPROL -XL) 50 MG 24 hr tablet Take 1 tablet (50 mg total) by mouth 2 (two) times daily. Take with or immediately following a meal. 01/03/24   Verlin Lonni BIRCH, MD  nicotine  (NICODERM CQ  - DOSED IN MG/24 HOURS) 21 mg/24hr patch Place 1 patch (21 mg total) onto the skin daily. 10/11/23   Sheron Lorette GRADE, PA-C  nitroGLYCERIN  (NITROSTAT ) 0.4 MG SL tablet PLACE 1 TABLET UNDER THE TONGUE EVERY 5 MINUTES AS NEEDED FOR CHEST PAIN. 10/05/23   Verlin Lonni BIRCH, MD  omeprazole (PRILOSEC) 20 MG capsule Take 20 mg by mouth daily.    [provider]  podofilox (CONDYLOX) 0.5 % external solution Apply 1 Application topically 2 (two) times daily.    [provider]  spironolactone  (ALDACTONE ) 25 MG tablet Take 1 tablet (25 mg total) by mouth daily. 11/08/23   Lucien Orren SAILOR, PA-C  ticagrelor  (BRILINTA ) 90 MG TABS tablet Take 1 tablet (90 mg total) by mouth 2 (two) times daily. 01/03/24   Verlin Lonni BIRCH, MD  zolpidem  (AMBIEN ) 10 MG tablet Take 10 mg by mouth at  bedtime.    [provider]  Past Surgical History Past Surgical History:  Procedure Laterality Date   ABDOMINAL EXPOSURE N/A 06/28/2020   Procedure: ABDOMINAL EXPOSURE;  Surgeon: Gretta Lonni PARAS, MD;  Location: [facility] OR;  Service: Vascular;  Laterality: N/A;  anterior   ANTERIOR LUMBAR FUSION N/A 06/28/2020   Procedure: ANTERIOR LUMBAR INTERBODY FUSION LUMBAR FIVE-SACRAL ONE.;  Surgeon: Joshua Alm RAMAN, MD;  Location: [facility] OR;  Service: Neurosurgery;  Laterality: N/A;  anterior approach   BIOPSY  01/28/2023   Procedure: BIOPSY;  Surgeon: Kriss Estefana DEL, DO;  Location: [facility];  Service: Gastroenterology;;   BREAST ENHANCEMENT SURGERY     CARDIAC CATHETERIZATION     CARPAL TUNNEL RELEASE Left    CARPAL TUNNEL RELEASE Right 06/28/2020   Procedure: CARPAL TUNNEL RELEASE;  Surgeon: Joshua Alm RAMAN, MD;  Location: [facility] OR;  Service: Neurosurgery;  Laterality: Right;   CERVICAL SPINE SURGERY     CHOLECYSTECTOMY     CLIPPING OF ATRIAL APPENDAGE N/A 10/08/2022   Procedure: CLIPPING OF ATRIAL APPENDAGE USING ATRICURE ATRICLIP PRO2 SIZE 35;  Surgeon: Lucas Dorise POUR, MD;  Location: [facility] OR;  Service: Open Heart Surgery;  Laterality: N/A;   CORONARY ARTERY BYPASS GRAFT N/A 10/08/2022   Procedure: CORONARY ARTERY BYPASS GRAFTING (CABG) X3 USING LEFT INTERNAL MAMMARY ARTERY AND RIGHT GREATER SAPHENOUS VEIN HARVESTED ENDOSCOPICALLY;  Surgeon: Lucas Dorise POUR, MD;  Location: [facility] OR;  Service: Open Heart Surgery;  Laterality: N/A;   CORONARY PRESSURE/FFR STUDY N/A 09/07/2022   Procedure: INTRAVASCULAR PRESSURE WIRE/FFR STUDY;  Surgeon: Mady Lonni, MD;  Location: [facility] INVASIVE CV LAB;  Service: Cardiovascular;  Laterality: N/A;   CORONARY STENT INTERVENTION N/A 10/09/2023   Procedure: CORONARY STENT INTERVENTION;  Surgeon: Wendel Lurena POUR, MD;  Location: [facility]  INVASIVE CV LAB;  Service: Cardiovascular;  Laterality: N/A;   CORONARY/GRAFT ACUTE MI REVASCULARIZATION N/A 10/09/2023   Procedure: Coronary/Graft Acute MI Revascularization;  Surgeon: Wendel Lurena POUR, MD;  Location: [facility] INVASIVE CV LAB;  Service: Cardiovascular;  Laterality: N/A;   ESOPHAGOGASTRODUODENOSCOPY N/A 01/28/2023   Procedure: ESOPHAGOGASTRODUODENOSCOPY (EGD);  Surgeon: Kriss Estefana DEL, DO;  Location: [facility];  Service: Gastroenterology;  Laterality: N/A;   FOOT SURGERY     LEFT HEART CATH AND CORONARY ANGIOGRAPHY N/A 10/09/2023   Procedure: LEFT HEART CATH AND CORONARY ANGIOGRAPHY;  Surgeon: Wendel Lurena POUR, MD;  Location: [facility] INVASIVE CV LAB;  Service: Cardiovascular;  Laterality: N/A;   MANDIBLE SURGERY     MAZE N/A 10/08/2022   Procedure: PULMONARY VEIN ABLATION USING ATRICURE ISOLATOR SYNERGY ENCOMPASS OLH;  Surgeon: Lucas Dorise POUR, MD;  Location: [facility] OR;  Service: Open Heart Surgery;  Laterality: N/A;   RIGHT/LEFT HEART CATH AND CORONARY ANGIOGRAPHY N/A 09/07/2022   Procedure: RIGHT/LEFT HEART CATH AND CORONARY ANGIOGRAPHY;  Surgeon: Mady Lonni, MD;  Location: [facility] INVASIVE CV LAB;  Service: Cardiovascular;  Laterality: N/A;   TEE WITHOUT CARDIOVERSION N/A 10/08/2022   Procedure: TRANSESOPHAGEAL ECHOCARDIOGRAM;  Surgeon: Lucas Dorise POUR, MD;  Location: [facility] OR;  Service: Open Heart Surgery;  Laterality: N/A;   TONSILLECTOMY     TUBAL LIGATION     with ablation   TYMPANOSTOMY TUBE PLACEMENT     Family History Family History  Problem Relation Age of Onset   Hypertension Father    Heart disease Father     Social History Social History   Tobacco Use   Smoking status: Every Day    Current packs/day: 1.00    Average packs/day: 1 pack/day for 31.7 years (31.7 ttl pk-yrs)  Types: Cigarettes    Start date: 1994   Smokeless tobacco: Never  Vaping Use   Vaping status: Never Used  Substance Use Topics   Alcohol use: Yes    Comment: occasional   Drug use: Never    Allergies Zoloft [sertraline]  Review of Systems Review of Systems  Physical Exam Vital Signs  I have reviewed the triage vital signs BP (!) 153/104 (BP Location: Left Arm)   Pulse (!) 105   Temp 98.7 F (37.1 C) (Oral)   Resp 19   SpO2 98%   Physical Exam Vitals and nursing note reviewed.  Eyes:     Pupils: Pupils are equal, round, and reactive to light.  Cardiovascular:     Rate and Rhythm: Normal rate.  Pulmonary:     Effort: Pulmonary effort is normal.  Abdominal:     General: Abdomen is flat.  Musculoskeletal:     Cervical back: Normal range of motion.  Neurological:     Mental Status: She is alert.     ED Results and Treatments Labs (all labs ordered are listed, but only abnormal results are displayed) Labs Reviewed  COMPREHENSIVE METABOLIC PANEL WITH GFR - Abnormal; Notable for the following components:      Result Value   Potassium 3.2 (*)    Glucose, Bld 122 (*)    Calcium  8.5 (*)    Total Protein 6.4 (*)    AST 42 (*)    Total Bilirubin 1.4 (*)    All other components within normal limits  I-STAT CHEM 8, ED - Abnormal; Notable for the following components:   Potassium 3.2 (*)    Glucose, Bld 120 (*)    Calcium , Ion 1.03 (*)    All other components within normal limits  CBC WITH DIFFERENTIAL/PLATELET  LIPASE, BLOOD  TROPONIN I (HIGH SENSITIVITY)  TROPONIN I (HIGH SENSITIVITY)                                                                                                                          Radiology CT ABDOMEN PELVIS W CONTRAST Result Date: 03/21/2024 CLINICAL DATA:  Palpitations with an odd sensation in the chest. EXAM: CT ABDOMEN AND PELVIS WITH CONTRAST TECHNIQUE: Multidetector CT imaging of the abdomen and pelvis was performed using the standard protocol following bolus administration of intravenous contrast. RADIATION DOSE REDUCTION: This exam was performed according to the departmental dose-optimization program which includes  automated exposure control, adjustment of the mA and/or kV according to patient size and/or use of iterative reconstruction technique. CONTRAST:  75mL OMNIPAQUE  IOHEXOL  350 MG/ML SOLN COMPARISON:  January 26, 2023 FINDINGS: Lower chest: Stable hazy ground-glass appearing lung parenchyma is seen within the bilateral lung bases. Hepatobiliary: No focal liver abnormality is seen. Status post cholecystectomy. No biliary dilatation. Pancreas: Unremarkable. No pancreatic ductal dilatation or surrounding inflammatory changes. Spleen: Normal in size without focal abnormality. Adrenals/Urinary Tract: There is a stable 10 mm low-attenuation right adrenal mass. A stable 14 mm low-attenuation  left adrenal mass is also seen. Kidneys are normal, without renal calculi, focal lesion, or hydronephrosis. Bladder is unremarkable. Stomach/Bowel: Stomach is within normal limits. Appendix appears normal. No evidence of bowel dilatation. Diffuse mural fatty infiltration is seen throughout the large bowel. Mild diffuse colonic wall thickening is also noted. Noninflamed diverticula are seen within the proximal sigmoid colon. Vascular/Lymphatic: Aortic atherosclerosis. No enlarged abdominal or pelvic lymph nodes. Reproductive: Uterus and bilateral adnexa are unremarkable. Other: No abdominal wall hernia or abnormality. No abdominopelvic ascites. Musculoskeletal: No acute or significant osseous findings. IMPRESSION: 1. Findings which may represent sequelae associated with mild colitis. 2. Sigmoid diverticulosis. 3. Stable bilateral adrenal adenomas. No follow-up imaging is recommended. This recommendation follows ACR consensus guidelines: Management of Incidental Adrenal Masses: A White Paper of the ACR Incidental Findings Committee. J Am Coll Radiol 2017;14:1038-1044. 4. Evidence of prior cholecystectomy. 5. Aortic atherosclerosis. Electronically Signed   By: Suzen Dials M.D.   On: 03/21/2024 13:52    Pertinent labs & imaging results  that were available during my care of the patient were reviewed by me and considered in my medical decision making (see MDM for details).  Medications Ordered in ED Medications  potassium chloride  SA (KLOR-CON  M) CR tablet 40 mEq (has no administration in time range)  pantoprazole  (PROTONIX ) injection 40 mg (40 mg Intravenous Given 03/21/24 1237)  sodium chloride  0.9 % bolus 1,000 mL (1,000 mLs Intravenous New Bag/Given 03/21/24 1235)  iohexol  (OMNIPAQUE ) 350 MG/ML injection 75 mL (75 mLs Intravenous Contrast Given 03/21/24 1328)                                                                                                                                     Procedures Procedures  (including critical care time)  Medical Decision Making / ED Course   This patient presents to the ED for concern of chest pain epigastric pain, this involves an extensive number of treatment options, and is a complaint that carries with it a high risk of complications and morbidity.  The differential diagnosis includes ACS, GERD, reflux, less likely PE, intra-abdominal infection, less likely pancreatitis, consider biliary disease.  MDM: On exam, patient overall well-appearing.  She has multiple complaints primarily focused on her epigastric discomfort which she states that she has had previously when she has had cardiac issues.  She has a soft abdomen.  Heart rate is 100.  Lung sounds are clear.  Initial EKG nonischemic.  Will check troponin.  Will obtain imaging of the patient's abdomen.  Blood work ordered.  Reassessment 3:50 PM-patient with negative troponin x 2.  CT abdomen pelvis negative.  Mild hypokalemia.  Have repleted.  Think it is unlikely that this is cardiac ischemia at this time.    Went to discuss patient's reassuring workup with her at bedside.  Patient was still concerned about her high blood pressure that she had had while in the ambulance.  I discussed  with the patient how on our monitors she had  been normotensive to mildly hypertensive the entire time she had been in the ED and it was likely not contributing to her symptoms.  I advised patient to follow-up with her primary care doctor for further concerns.   Will discharge patient   Additional history obtained: -Additional history obtained from EMS -External records from outside source obtained and reviewed including: Chart review including previous notes, labs, imaging, consultation notes   Lab Tests: -I ordered, reviewed, and interpreted labs.   The pertinent results include:   Labs Reviewed  COMPREHENSIVE METABOLIC PANEL WITH GFR - Abnormal; Notable for the following components:      Result Value   Potassium 3.2 (*)    Glucose, Bld 122 (*)    Calcium  8.5 (*)    Total Protein 6.4 (*)    AST 42 (*)    Total Bilirubin 1.4 (*)    All other components within normal limits  I-STAT CHEM 8, ED - Abnormal; Notable for the following components:   Potassium 3.2 (*)    Glucose, Bld 120 (*)    Calcium , Ion 1.03 (*)    All other components within normal limits  CBC WITH DIFFERENTIAL/PLATELET  LIPASE, BLOOD  TROPONIN I (HIGH SENSITIVITY)  TROPONIN I (HIGH SENSITIVITY)      EKG sinus rhythm, chronic ST changes.  No significant change from prior.  EKG Interpretation Date/Time:  Tuesday March 21 2024 11:44:17 EDT Ventricular Rate:  101 PR Interval:  164 QRS Duration:  84 QT Interval:  368 QTC Calculation: 477 R Axis:   -13  Text Interpretation: Sinus tachycardia Possible Anterior infarct , age undetermined Abnormal ECG When compared with ECG of 10-Jan-2024 13:12, PREVIOUS ECG IS PRESENT Confirmed by Mannie Pac [phone]) on 03/21/2024 12:22:38 PM         Imaging Studies ordered: I ordered imaging studies including CT abdomen pelvis I independently visualized and interpreted imaging. I agree with the radiologist interpretation   Medicines ordered and prescription drug management: Meds ordered this encounter   Medications   pantoprazole  (PROTONIX ) injection 40 mg   sodium chloride  0.9 % bolus 1,000 mL   iohexol  (OMNIPAQUE ) 350 MG/ML injection 75 mL   potassium chloride  SA (KLOR-CON  M) CR tablet 40 mEq    -I have reviewed the patients home medicines and have made adjustments as needed  Cardiac Monitoring: The patient was maintained on a cardiac monitor.  I personally viewed and interpreted the cardiac monitored which showed an underlying rhythm of: Normal sinus rhythm  Social Determinants of Health:  Factors impacting patients care include: Multiple medical comorbidities including prior history of bypass surgery   Reevaluation: After the interventions noted above, I reevaluated the patient and found that they have :improved  Co morbidities that complicate the patient evaluation  Past Medical History:  Diagnosis Date   A-fib (HCC)    Anxiety    Arthralgia    Asthma    Attention-deficit hyperactivity disorder, predominantly inattentive type    Back pain    Coronary artery disease    Dysrhythmia    Afib   Fear of flying    Fibromyalgia    denies having Fibromyalgia   Gastroesophageal reflux disease    HPV in female    Joint pain    Localized edema    Neck pain    OA (osteoarthritis)    Papillomavirus as the cause of diseases classified elsewhere    Primary insomnia    Pure hypercholesterolemia  Rheumatoid arthritis, seronegative, hand, unspecified laterality (HCC)    patient feels it is back related, not a true arthritis   Spondylolisthesis of lumbosacral region    Tobacco dependence    Unspecified inflammatory spondylopathy, cervical region [facility])    Vitamin B12 deficiency anemia    Voice hoarseness       Dispostion: I considered admission for this patient, however based on her reassuring workup she is appropriate for discharge.     Final Clinical Impression(s) / ED Diagnoses Final diagnoses:  Epigastric pain     @PCDICTATION @    Mannie Pac T,  DO 03/21/24 1555    Mannie Pac T, DO 03/21/24 1603

## 2024-03-21 NOTE — ED Triage Notes (Signed)
 Patient BIB from home for palpitations and BP fluctuations, spoke with cards, multifocal PVCs en route, tachy at 108. BPs ranged from 136-180 systolic. Post CABG in March. Denies CP, just an odd sensation in there chest.  BP 132/76 HR 108 RR 16 96% RA

## 2024-03-21 NOTE — Telephone Encounter (Signed)
 Called patient back about message. Patient was being discharged form the ED. Patient stated this morning she took all her morning medications and then threw them up an hour later. Patient stated she started having symptoms around 10:30 am this morning BP 184/118 HR 110 to 120's, chest pain- stronger pumping feeling, heart jittery, blurred vision, felt like she was in a haze, and extremely fatigued. Patient was told to go home and contact her provider, and continue taking her medications as prescribed.  Now patient stated her chest pain is the same, even though the BP and HR are down. Will send message to Dr. Verlin for advisement and patient would like to speak to Dr. Verlin if possible. Encouraged patient if she still continues to have elevated HR and BP tomorrow, to give our office a call and we will see if we can get her an appointment to see the DOD this week.

## 2024-03-22 LAB — LIPASE, BLOOD: Lipase: 42 U/L (ref 11–51)

## 2024-03-22 NOTE — Telephone Encounter (Signed)
 Spoke with the patient and scheduled her for an appointment tomorrow at 4pm with Dr. Verlin.

## 2024-03-23 ENCOUNTER — Encounter: Payer: Self-pay | Admitting: Cardiovascular Disease

## 2024-03-23 ENCOUNTER — Ambulatory Visit: Attending: Cardiovascular Disease | Admitting: Cardiovascular Disease

## 2024-03-23 VITALS — BP 116/90 | HR 95 | Ht 62.0 in | Wt 168.2 lb

## 2024-03-23 DIAGNOSIS — I471 Supraventricular tachycardia, unspecified: Secondary | ICD-10-CM

## 2024-03-23 DIAGNOSIS — E7800 Pure hypercholesterolemia, unspecified: Secondary | ICD-10-CM | POA: Diagnosis not present

## 2024-03-23 DIAGNOSIS — I10 Essential (primary) hypertension: Secondary | ICD-10-CM

## 2024-03-23 DIAGNOSIS — I255 Ischemic cardiomyopathy: Secondary | ICD-10-CM

## 2024-03-23 DIAGNOSIS — I2510 Atherosclerotic heart disease of native coronary artery without angina pectoris: Secondary | ICD-10-CM | POA: Diagnosis not present

## 2024-03-23 MED ORDER — METOPROLOL SUCCINATE ER 50 MG PO TB24: 50.0000 mg | ORAL_TABLET | Freq: Every day | ORAL | 3 refills | Status: DC

## 2024-03-23 MED ORDER — TICAGRELOR 90 MG PO TABS: 90.0000 mg | ORAL_TABLET | Freq: Two times a day (BID) | ORAL | 2 refills | Status: AC

## 2024-03-23 MED ORDER — LOSARTAN POTASSIUM 25 MG PO TABS: 25.0000 mg | ORAL_TABLET | Freq: Every day | ORAL | 3 refills | Status: DC

## 2024-03-23 NOTE — Addendum Note (Signed)
 Addended by: Kyrese Gartman L on: 03/23/2024 04:37 PM   Modules accepted: Orders

## 2024-03-23 NOTE — Progress Notes (Signed)
 Chief Complaint  Patient presents with   Follow-up    CAD   History of Present Illness: 53 yo female with history of CAD s/p 3V CABG, atrial fibrillation, ischemic cardiomyopathy, anxiety, fibromyalgia, GERD, arthritis, HLD, tobacco abuse here today for cardiac follow up. She was seen as a new patient for the evaluation of her cardiac risk factors in March 2021. Normal exercise stress test March 2021. Echo March 2021 with LVEF=50-55%. No valve disease. Cardiac monitor April 2021 with sinus rhythm, 2 short runs of VT (longest 9 beats), 3 short runs of SVt (longest 12 beats).  She was told that she had atrial fibrillation back in 2023. 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 Echo January 2024 with LVEF=35-40%. Mild to moderate mitral valve regurgitation. Coronary CTA January 2024 with moderate 3 vessel CAD. Cardiac cath 09/07/22 with severe LAD and Diagonal disease. She underwent 4V CABG on 10/08/22 with overall uneventful postoperative course. Also with pulmonary vein isolation ablation and ligation of the left atrial appendage at the time of her surgery. Echo June 2024 with normal LV function and no significant valve disease. I saw her in the office in August 2024 and she was doing well. She was admitted to [facility] in March 2025 with a NSTEMI.  Cardiac cath March 2025 with chronic occlusion of the mid LAD and proximal Circumflex with patent LIMA to LAD, patent SVG to Diagonal, patent SVG to OM. Severe proximal RCA stenosis treated with a drug eluting stent. Echo March 2025 with LVEF=40-45%. Normal RV function. No valve disease. She was seen in our office in August 2025 with c/o atypical chest pain that was not felt to be cardiac related. She reported issues with swallowing and coughing. Recent GI workup with esophagitis. She was referred to Pulmonary for consideration of bronchoscopy following intubation with bypass surgery to exclude structural issues leading to aspiration. She was seen in the ED at [facility] on 03/21/24 with c/o  blood pressure fluctuation. She also described epigastric pain. Troponin was negative.  Her pain was not felt to be cardiac related.   She is here today for follow up. She does not feel well. No energy. She has chest pain when her blood pressure goes up. No dyspnea, palpitations, lower extremity edema, orthopnea, PND, dizziness, near syncope or syncope.   Primary Care Physician: Arloa Elsie SAUNDERS, MD  Past Medical History:  Diagnosis Date   A-fib [facility])    Anxiety    Arthralgia    Asthma    Attention-deficit hyperactivity disorder, predominantly inattentive type    Back pain    Coronary artery disease    Dysrhythmia    Afib   Fear of flying    Fibromyalgia    denies having Fibromyalgia   Gastroesophageal reflux disease    HPV in female    Joint pain    Localized edema    Neck pain    OA (osteoarthritis)    Papillomavirus as the cause of diseases classified elsewhere    Primary insomnia    Pure hypercholesterolemia    Rheumatoid arthritis, seronegative, hand, unspecified laterality (HCC)    patient feels it is back related, not a true arthritis   Spondylolisthesis of lumbosacral region    Tobacco dependence    Unspecified inflammatory spondylopathy, cervical region (HCC)    Vitamin B12 deficiency anemia    Voice hoarseness     Past Surgical History:  Procedure Laterality Date   ABDOMINAL EXPOSURE N/A 06/28/2020   Procedure: ABDOMINAL EXPOSURE;  Surgeon: Gretta,  Lonni PARAS, MD;  Location: [facility] OR;  Service: Vascular;  Laterality: N/A;  anterior   ANTERIOR LUMBAR FUSION N/A 06/28/2020   Procedure: ANTERIOR LUMBAR INTERBODY FUSION LUMBAR FIVE-SACRAL ONE.;  Surgeon: Joshua Alm RAMAN, MD;  Location: [facility] OR;  Service: Neurosurgery;  Laterality: N/A;  anterior approach   BIOPSY  01/28/2023   Procedure: BIOPSY;  Surgeon: Kriss Estefana DEL, DO;  Location: [facility];  Service: Gastroenterology;;   BREAST ENHANCEMENT SURGERY     CARDIAC CATHETERIZATION     CARPAL TUNNEL RELEASE  Left    CARPAL TUNNEL RELEASE Right 06/28/2020   Procedure: CARPAL TUNNEL RELEASE;  Surgeon: Joshua Alm RAMAN, MD;  Location: [facility] OR;  Service: Neurosurgery;  Laterality: Right;   CERVICAL SPINE SURGERY     CHOLECYSTECTOMY     CLIPPING OF ATRIAL APPENDAGE N/A 10/08/2022   Procedure: CLIPPING OF ATRIAL APPENDAGE USING ATRICURE ATRICLIP PRO2 SIZE 35;  Surgeon: Lucas Dorise POUR, MD;  Location: [facility] OR;  Service: Open Heart Surgery;  Laterality: N/A;   CORONARY ARTERY BYPASS GRAFT N/A 10/08/2022   Procedure: CORONARY ARTERY BYPASS GRAFTING (CABG) X3 USING LEFT INTERNAL MAMMARY ARTERY AND RIGHT GREATER SAPHENOUS VEIN HARVESTED ENDOSCOPICALLY;  Surgeon: Lucas Dorise POUR, MD;  Location: [facility] OR;  Service: Open Heart Surgery;  Laterality: N/A;   CORONARY PRESSURE/FFR STUDY N/A 09/07/2022   Procedure: INTRAVASCULAR PRESSURE WIRE/FFR STUDY;  Surgeon: Mady Lonni, MD;  Location: [facility] INVASIVE CV LAB;  Service: Cardiovascular;  Laterality: N/A;   CORONARY STENT INTERVENTION N/A 10/09/2023   Procedure: CORONARY STENT INTERVENTION;  Surgeon: Wendel Lurena POUR, MD;  Location: [facility]VAS[facility] CV LAB;  Service: Cardiovascular;  Laterality: N/A;   CORONARY/GRAFT ACUTE MI REVASCULARIZATION N/A 10/09/2023   Procedure: Coronary/Graft Acute MI Revascularization;  Surgeon: Wendel Lurena POUR, MD;  Location: [facility] LAB;  Service: Cardiovascular;  Laterality: N/A;   ESOPHAGOGASTRODUODENOSCOPY N/A 01/28/2023   Procedure: ESOPHAGOGASTRODUODENOSCOPY (EGD);  Surgeon: Kriss Estefana DEL, DO;  Location: [facility];  Service: Gastroenterology;  Laterality: N/A;   FOOT SURGERY     LEFT HEART CATH AND CORONARY ANGIOGRAPHY N/A 10/09/2023   Procedure: LEFT HEART CATH AND CORONARY ANGIOGRAPHY;  Surgeon: Wendel Lurena POUR, MD;  Location: [facility] LAB;  Service: Cardiovascular;  Laterality: N/A;   MANDIBLE SURGERY     MAZE N/A 10/08/2022   Procedure: PULMONARY VEIN ABLATION USING ATRICURE ISOLATOR SYNERGY ENCOMPASS OLH;  Surgeon:  Lucas Dorise POUR, MD;  Location: [facility] OR;  Service: Open Heart Surgery;  Laterality: N/A;   RIGHT/LEFT HEART CATH AND CORONARY ANGIOGRAPHY N/A 09/07/2022   Procedure: RIGHT/LEFT HEART CATH AND CORONARY ANGIOGRAPHY;  Surgeon: Mady Lonni, MD;  Location: [facility];  Service: Cardiovascular;  Laterality: N/A;   TEE WITHOUT CARDIOVERSION N/A 10/08/2022   Procedure: TRANSESOPHAGEAL ECHOCARDIOGRAM;  Surgeon: Lucas Dorise POUR, MD;  Location: [facility] OR;  Service: Open Heart Surgery;  Laterality: N/A;   TONSILLECTOMY     TUBAL LIGATION     with ablation   TYMPANOSTOMY TUBE PLACEMENT      Current Outpatient Medications  Medication Sig Dispense Refill   albuterol  (VENTOLIN  HFA) 108 (90 Base) MCG/ACT inhaler Inhale 2 puffs into the lungs every 4 (four) hours as needed for wheezing or shortness of breath. 6.7 g 1   ALPRAZolam (XANAX) 0.5 MG tablet 1 tablet Orally once a day for 30 days As needed     amphetamine -dextroamphetamine  (ADDERALL XR) 30 MG 24 hr capsule Take 60 mg by mouth daily.     Ascorbic Acid  500  MG CAPS as directed Orally once a day; Duration: 90 days     aspirin  EC 81 MG tablet Take 1 tablet (81 mg total) by mouth daily. Swallow whole. 90 tablet 3   aspirin -acetaminophen -caffeine (EXCEDRIN MIGRAINE) 250-250-65 MG tablet Take 2 tablets by mouth daily as needed.     atorvastatin  (LIPITOR ) 80 MG tablet Take 1 tablet (80 mg total) by mouth daily. 90 tablet 1   cholecalciferol (VITAMIN D3) 25 MCG (1000 UNIT) tablet Take 1,000 Units by mouth daily.     colestipol (COLESTID) 1 g tablet TAKE 1 TABLET (1 GRAM DOSE) BY MOUTH TWICE A DAY     cyanocobalamin  (VITAMIN B12) 1000 MCG tablet Take 1,000 mcg by mouth daily.     escitalopram  (LEXAPRO ) 10 MG tablet Take 10 mg by mouth daily as needed (anxiety).     ezetimibe  (ZETIA ) 10 MG tablet Take 1 tablet (10 mg total) by mouth daily. 90 tablet 2   furosemide  (LASIX ) 20 MG tablet Take 1 tablet (20 mg total) by mouth daily as needed (for lower  extremity edema). 30 tablet 0   gabapentin  (NEURONTIN ) 400 MG capsule Take 800 mg by mouth daily as needed (for pain).     icosapent  Ethyl (VASCEPA ) 1 g capsule Take 2 capsules (2 g total) by mouth 2 (two) times daily. 120 capsule 6   losartan  (COZAAR ) 25 MG tablet Take 1 tablet (25 mg total) by mouth daily. 90 tablet 3   Magnesium  300 MG CAPS daily as needed.     metoprolol  succinate (TOPROL -XL) 50 MG 24 hr tablet Take 1 tablet (50 mg total) by mouth daily. Take with or immediately following a meal. 90 tablet 3   nicotine  (NICODERM CQ  - DOSED IN MG/24 HOURS) 21 mg/24hr patch Place 1 patch (21 mg total) onto the skin daily. 28 patch 0   nitroGLYCERIN  (NITROSTAT ) 0.4 MG SL tablet PLACE 1 TABLET UNDER THE TONGUE EVERY 5 MINUTES AS NEEDED FOR CHEST PAIN. 25 tablet 4   omeprazole (PRILOSEC) 20 MG capsule Take 20 mg by mouth daily.     podofilox (CONDYLOX) 0.5 % external solution Apply 1 Application topically 2 (two) times daily.     spironolactone  (ALDACTONE ) 25 MG tablet Take 1 tablet (25 mg total) by mouth daily. 90 tablet 3   ticagrelor  (BRILINTA ) 90 MG TABS tablet Take 1 tablet (90 mg total) by mouth 2 (two) times daily. 180 tablet 3   zolpidem  (AMBIEN ) 10 MG tablet Take 10 mg by mouth at bedtime.     Current Facility-Administered Medications  Medication Dose Route Frequency Provider Last Rate Last Admin   lactulose  (CHRONULAC ) 10 GM/15ML solution 20 g  20 g Oral Daily PRN Bartle, Bryan K, MD        Allergies  Allergen Reactions   Zoloft [Sertraline] Other (See Comments)    Altered mental state    Social History   Socioeconomic History   Marital status: Married    Spouse name: Not on file   Number of children: 2   Years of education: 13   Highest education level: Some college, no degree  Occupational History   Occupation: unemployed  Tobacco Use   Smoking status: Every Day    Current packs/day: 1.00    Average packs/day: 1 pack/day for 31.7 years (31.7 ttl pk-yrs)    Types:  Cigarettes    Start date: 1994   Smokeless tobacco: Never  Vaping Use   Vaping status: Never Used  Substance and Sexual Activity  Alcohol use: Yes    Comment: occasional   Drug use: Never   Sexual activity: Not on file  Other Topics Concern   Not on file  Social History Narrative   Not on file   Social Drivers of Health   Financial Resource Strain: Low Risk  (10/02/2023)   Received from [facility]   Overall Financial Resource Strain (CARDIA)    Difficulty of Paying Living Expenses: Not very hard  Food Insecurity: No Food Insecurity (10/09/2023)   Hunger Vital Sign    Worried About Running Out of Food in the Last Year: Never true    Ran Out of Food in the Last Year: Never true  Transportation Needs: No Transportation Needs (10/10/2023)   PRAPARE - Administrator, Civil Service (Medical): No    Lack of Transportation (Non-Medical): No  Physical Activity: Not on file  Stress: Not on file  Social Connections: Unknown (03/25/2023)   Received from [facility]   Social Network    Social Network: Not on file  Intimate Partner Violence: Not At Risk (10/10/2023)   Humiliation, Afraid, Rape, and Kick questionnaire    Fear of Current or Ex-Partner: No    Emotionally Abused: No    Physically Abused: No    Sexually Abused: No    Family History  Problem Relation Age of Onset   Hypertension Father    Heart disease Father     Review of Systems:  As stated in the HPI and otherwise negative.   BP (!) 116/90   Pulse 95   Ht 5' 2 (1.575 m)   Wt 168 lb 3.2 oz (76.3 kg)   SpO2 95%   BMI 30.76 kg/m   Physical Examination: General: Well developed, well nourished, NAD  HEENT: OP clear, mucus membranes moist  SKIN: warm, dry. No rashes. Neuro: No focal deficits  Musculoskeletal: Muscle strength 5/5 all ext  Psychiatric: Mood and affect normal  Neck: No JVD, no carotid bruits, no thyromegaly, no lymphadenopathy.  Lungs:Clear bilaterally, no wheezes, rhonci,  crackles Cardiovascular: Regular rate and rhythm. No murmurs, gallops or rubs. Abdomen:Soft. Bowel sounds present. Non-tender.  Extremities: No lower extremity edema. Pulses are 2 + in the bilateral DP/PT.  EKG:  EKG is not ordered today. The ekg ordered today demonstrates   Recent Labs: 10/19/2023: Magnesium  2.2 01/12/2024: BNP 80.1 03/21/2024: ALT 29; BUN 13; Creatinine, Ser 0.70; Hemoglobin 14.6; Platelets 212; Potassium 3.2; Sodium 138   Lipid Panel    Component Value Date/Time   CHOL 113 02/29/2024 1537   TRIG 192 (H) 02/29/2024 1537   HDL 45 02/29/2024 1537   CHOLHDL 2.5 02/29/2024 1537   CHOLHDL 4.3 10/10/2023 0345   VLDL 69 (H) 10/10/2023 0345   LDLCALC 37 02/29/2024 1537     Wt Readings from Last 3 Encounters:  03/23/24 168 lb 3.2 oz (76.3 kg)  02/29/24 175 lb 9.6 oz (79.7 kg)  01/11/24 174 lb 12.8 oz (79.3 kg)    Assessment and Plan:   1. CAD s/p CABG without angina: No exertional chest pain. Continue ASA, Brilinta , Toprol  and statin.    2. Hyperlipidemia: LDL 37 in August 2025. Continue statin.     3. Palpitations/SVT/PVCs/Possible atrial fibrillation: Cardiac monitor February 2024 with no evidence of atrial fib. No recent palpitations  4. Ischemic cardiomyopathy: LVEF=40-45% by echo March 2025 (up from 35% pre-CABG). Continue Toprol , Losartan  and aldactone   5. HTN: BP is well controlled but given fatigue will lower Toprol  to 50 mg  per day and increase Losartan  to 25 mg daily. Continue aldactone .   Labs/ tests ordered today include:  No orders of the defined types were placed in this encounter.  Disposition:   F/U with me in 12 months.    Signed, Lonni Cash, MD 03/23/2024 4:35 PM    [facility] [address] [place], [place], [place]  [address] Phone: [phone]; Fax: [phone]

## 2024-03-23 NOTE — Patient Instructions (Signed)
 Medication Instructions:  Your physician has recommended you make the following change in your medication:  Decrease Metoprolol  Succinate to 50 mg by mouth daily  Increase Losartan  to 25 mg by mouth daily   *If you need a refill on your cardiac medications before your next appointment, please call your pharmacy*  Lab Work: none If you have labs (blood work) drawn today and your tests are completely normal, you will receive your results only by: MyChart Message (if you have MyChart) OR A paper copy in the mail If you have any lab test that is abnormal or we need to change your treatment, we will call you to review the results.  Testing/Procedures: none  Follow-Up: At [facility], you and your health needs are our priority.  As part of our continuing mission to provide you with exceptional heart care, our providers are all part of one team.  This team includes your primary Cardiologist (physician) and Advanced Practice Providers or APPs (Physician Assistants and Nurse Practitioners) who all work together to provide you with the care you need, when you need it.  Your next appointment:   10/23  Provider:   Lonni Cash, MD    We recommend signing up for the patient portal called MyChart.  Sign up information is provided on this After Visit Summary.  MyChart is used to connect with patients for Virtual Visits (Telemedicine).  Patients are able to view lab/test results, encounter notes, upcoming appointments, etc.  Non-urgent messages can be sent to your provider as well.   To learn more about what you can do with MyChart, go to [email or web address].   Other Instructions

## 2024-04-09 NOTE — Progress Notes (Signed)
 New Patient Pulmonology Office Visit   Subjective:  Patient ID: JOHNAY MANO, female    DOB: [birth year]  MRN: [id number]  Referred by: Lucien Orren SAILOR, PA-C  CC: No chief complaint on file.   HPI KINGSLEY HERANDEZ is a 53 y.o. female with PMH significant for CABG, HLD, ischemic cardiomyopathy HFrEF 40-45%, afib, RA, tobacco use ?  No pulmonologidst before CABG 09/2022 - HEART ATTACK 2025x1stent march. TG are high Sob - after cabg apsiration in apple juice, aspirate more, choking episode once a week. Twice month, went ot the hospital x1 time ER. Difficulty swallowing food, liquid, 15 years ago, he did have swallowing problems a year ago.  [place]  Cough in the middle of the night. Troat really dry More sick frequent, from the chest - spit foamy T chest w cpntrast No swallowing problem but problems with spit  Sensation of stuck something thre with food  No mucus, some cough dry, clear her throat.  No wheezing No shortness of btreah, faitue all day, since the surgery. Swating more profusely  Some SOB,  needs to take more breaks Oxyemeter at home - 79% Smoking stiop after suergery, then she start smoking back again. Nicotine  , has patc andf gums,  Over 20 year 1ppd. SABRA  FEES- speech an swallow evaluation       Aspiration with chronic cough and risk of aspiration pneumonia Chronic aspiration. Risk of aspiration pneumonia from aspirated contents. - Refer to pulmonary specialist for bronchoscopy to evaluate structural issues in bronchus. - Send referral to Dr. Brenna or Dr. Shelah for bronchoscopy and pulmonary evaluation.  Dysphagia and chronic throat dryness post-bypass surgery Chronic throat dryness and dysphagia possibly related to intubation during surgery. - Include evaluation of throat dryness and dysphagia in pulmonary referral.  Smoking Smokes pack a day  -Nicotine  patch ordered  -PRN nebs -Continue atrovent     {PULM QUESTIONNAIRES  (Optional):33196}  ROS  Allergies: Zoloft [sertraline]  Current Outpatient Medications:    albuterol  (VENTOLIN  HFA) 108 (90 Base) MCG/ACT inhaler, Inhale 2 puffs into the lungs every 4 (four) hours as needed for wheezing or shortness of breath., Disp: 6.7 g, Rfl: 1   ALPRAZolam (XANAX) 0.5 MG tablet, 1 tablet Orally once a day for 30 days As needed, Disp: , Rfl:    amphetamine -dextroamphetamine  (ADDERALL XR) 30 MG 24 hr capsule, Take 60 mg by mouth daily., Disp: , Rfl:    Ascorbic Acid  500 MG CAPS, as directed Orally once a day; Duration: 90 days, Disp: , Rfl:    aspirin  EC 81 MG tablet, Take 1 tablet (81 mg total) by mouth daily. Swallow whole., Disp: 90 tablet, Rfl: 3   aspirin -acetaminophen -caffeine (EXCEDRIN MIGRAINE) 250-250-65 MG tablet, Take 2 tablets by mouth daily as needed., Disp: , Rfl:    atorvastatin  (LIPITOR ) 80 MG tablet, Take 1 tablet (80 mg total) by mouth daily., Disp: 90 tablet, Rfl: 1   cholecalciferol (VITAMIN D3) 25 MCG (1000 UNIT) tablet, Take 1,000 Units by mouth daily., Disp: , Rfl:    colestipol (COLESTID) 1 g tablet, TAKE 1 TABLET (1 GRAM DOSE) BY MOUTH TWICE A DAY, Disp: , Rfl:    cyanocobalamin  (VITAMIN B12) 1000 MCG tablet, Take 1,000 mcg by mouth daily., Disp: , Rfl:    escitalopram  (LEXAPRO ) 10 MG tablet, Take 10 mg by mouth daily as needed (anxiety)., Disp: , Rfl:    ezetimibe  (ZETIA ) 10 MG tablet, Take 1 tablet (10 mg total) by mouth daily., Disp: 90 tablet, Rfl:  2   furosemide  (LASIX ) 20 MG tablet, Take 1 tablet (20 mg total) by mouth daily as needed (for lower extremity edema)., Disp: 30 tablet, Rfl: 0   gabapentin  (NEURONTIN ) 400 MG capsule, Take 800 mg by mouth daily as needed (for pain)., Disp: , Rfl:    icosapent  Ethyl (VASCEPA ) 1 g capsule, Take 2 capsules (2 g total) by mouth 2 (two) times daily., Disp: 120 capsule, Rfl: 6   losartan  (COZAAR ) 25 MG tablet, Take 1 tablet (25 mg total) by mouth daily., Disp: 90 tablet, Rfl: 3   Magnesium  300 MG CAPS,  daily as needed., Disp: , Rfl:    metoprolol  succinate (TOPROL -XL) 50 MG 24 hr tablet, Take 1 tablet (50 mg total) by mouth daily. Take with or immediately following a meal., Disp: 90 tablet, Rfl: 3   nicotine  (NICODERM CQ  - DOSED IN MG/24 HOURS) 21 mg/24hr patch, Place 1 patch (21 mg total) onto the skin daily., Disp: 28 patch, Rfl: 0   nitroGLYCERIN  (NITROSTAT ) 0.4 MG SL tablet, PLACE 1 TABLET UNDER THE TONGUE EVERY 5 MINUTES AS NEEDED FOR CHEST PAIN., Disp: 25 tablet, Rfl: 4   omeprazole (PRILOSEC) 20 MG capsule, Take 20 mg by mouth daily., Disp: , Rfl:    podofilox (CONDYLOX) 0.5 % external solution, Apply 1 Application topically 2 (two) times daily., Disp: , Rfl:    spironolactone  (ALDACTONE ) 25 MG tablet, Take 1 tablet (25 mg total) by mouth daily., Disp: 90 tablet, Rfl: 3   ticagrelor  (BRILINTA ) 90 MG TABS tablet, Take 1 tablet (90 mg total) by mouth 2 (two) times daily., Disp: 180 tablet, Rfl: 2   zolpidem  (AMBIEN ) 10 MG tablet, Take 10 mg by mouth at bedtime., Disp: , Rfl:   Current Facility-Administered Medications:    lactulose  (CHRONULAC ) 10 GM/15ML solution 20 g, 20 g, Oral, Daily PRN, Lucas Dorise POUR, MD Past Medical History:  Diagnosis Date   A-fib (HCC)    Anxiety    Arthralgia    Asthma    Attention-deficit hyperactivity disorder, predominantly inattentive type    Back pain    Coronary artery disease    Dysrhythmia    Afib   Fear of flying    Fibromyalgia    denies having Fibromyalgia   Gastroesophageal reflux disease    HPV in female    Joint pain    Localized edema    Neck pain    OA (osteoarthritis)    Papillomavirus as the cause of diseases classified elsewhere    Primary insomnia    Pure hypercholesterolemia    Rheumatoid arthritis, seronegative, hand, unspecified laterality (HCC)    patient feels it is back related, not a true arthritis   Spondylolisthesis of lumbosacral region    Tobacco dependence    Unspecified inflammatory spondylopathy, cervical  region    Vitamin B12 deficiency anemia    Voice hoarseness    Past Surgical History:  Procedure Laterality Date   ABDOMINAL EXPOSURE N/A 06/28/2020   Procedure: ABDOMINAL EXPOSURE;  Surgeon: Gretta Lonni PARAS, MD;  Location: [facility] OR;  Service: Vascular;  Laterality: N/A;  anterior   ANTERIOR LUMBAR FUSION N/A 06/28/2020   Procedure: ANTERIOR LUMBAR INTERBODY FUSION LUMBAR FIVE-SACRAL ONE.;  Surgeon: Joshua Alm RAMAN, MD;  Location: [facility] OR;  Service: Neurosurgery;  Laterality: N/A;  anterior approach   BIOPSY  01/28/2023   Procedure: BIOPSY;  Surgeon: Kriss Estefana DEL, DO;  Location: [facility];  Service: Gastroenterology;;   BREAST ENHANCEMENT SURGERY     CARDIAC CATHETERIZATION  CARPAL TUNNEL RELEASE Left    CARPAL TUNNEL RELEASE Right 06/28/2020   Procedure: CARPAL TUNNEL RELEASE;  Surgeon: Joshua Alm RAMAN, MD;  Location: [facility] OR;  Service: Neurosurgery;  Laterality: Right;   CERVICAL SPINE SURGERY     CHOLECYSTECTOMY     CLIPPING OF ATRIAL APPENDAGE N/A 10/08/2022   Procedure: CLIPPING OF ATRIAL APPENDAGE USING ATRICURE ATRICLIP PRO2 SIZE 35;  Surgeon: Lucas Dorise POUR, MD;  Location: [facility] OR;  Service: Open Heart Surgery;  Laterality: N/A;   CORONARY ARTERY BYPASS GRAFT N/A 10/08/2022   Procedure: CORONARY ARTERY BYPASS GRAFTING (CABG) X3 USING LEFT INTERNAL MAMMARY ARTERY AND RIGHT GREATER SAPHENOUS VEIN HARVESTED ENDOSCOPICALLY;  Surgeon: Lucas Dorise POUR, MD;  Location: [facility] OR;  Service: Open Heart Surgery;  Laterality: N/A;   CORONARY PRESSURE/FFR STUDY N/A 09/07/2022   Procedure: INTRAVASCULAR PRESSURE WIRE/FFR STUDY;  Surgeon: Mady Bruckner, MD;  Location: [facility];  Service: Cardiovascular;  Laterality: N/A;   CORONARY STENT INTERVENTION N/A 10/09/2023   Procedure: CORONARY STENT INTERVENTION;  Surgeon: Wendel Lurena POUR, MD;  Location: [facility];  Service: Cardiovascular;  Laterality: N/A;   CORONARY/GRAFT ACUTE MI REVASCULARIZATION N/A 10/09/2023   Procedure:  Coronary/Graft Acute MI Revascularization;  Surgeon: Wendel Lurena POUR, MD;  Location: [facility];  Service: Cardiovascular;  Laterality: N/A;   ESOPHAGOGASTRODUODENOSCOPY N/A 01/28/2023   Procedure: ESOPHAGOGASTRODUODENOSCOPY (EGD);  Surgeon: Kriss Estefana DEL, DO;  Location: [facility];  Service: Gastroenterology;  Laterality: N/A;   FOOT SURGERY     LEFT HEART CATH AND CORONARY ANGIOGRAPHY N/A 10/09/2023   Procedure: LEFT HEART CATH AND CORONARY ANGIOGRAPHY;  Surgeon: Wendel Lurena POUR, MD;  Location: [facility];  Service: Cardiovascular;  Laterality: N/A;   MANDIBLE SURGERY     MAZE N/A 10/08/2022   Procedure: PULMONARY VEIN ABLATION USING ATRICURE ISOLATOR SYNERGY ENCOMPASS OLH;  Surgeon: Lucas Dorise POUR, MD;  Location: [facility] OR;  Service: Open Heart Surgery;  Laterality: N/A;   RIGHT/LEFT HEART CATH AND CORONARY ANGIOGRAPHY N/A 09/07/2022   Procedure: RIGHT/LEFT HEART CATH AND CORONARY ANGIOGRAPHY;  Surgeon: Mady Bruckner, MD;  Location: [facility];  Service: Cardiovascular;  Laterality: N/A;   TEE WITHOUT CARDIOVERSION N/A 10/08/2022   Procedure: TRANSESOPHAGEAL ECHOCARDIOGRAM;  Surgeon: Lucas Dorise POUR, MD;  Location: [facility] OR;  Service: Open Heart Surgery;  Laterality: N/A;   TONSILLECTOMY     TUBAL LIGATION     with ablation   TYMPANOSTOMY TUBE PLACEMENT     Family History  Problem Relation Age of Onset   Hypertension Father    Heart disease Father    Social History   Socioeconomic History   Marital status: Married    Spouse name: Not on file   Number of children: 2   Years of education: 13   Highest education level: Some college, no degree  Occupational History   Occupation: unemployed  Tobacco Use   Smoking status: Every Day    Current packs/day: 1.00    Average packs/day: 1 pack/day for 31.7 years (31.7 ttl pk-yrs)    Types: Cigarettes    Start date: 1994   Smokeless tobacco: Never  Vaping Use   Vaping status: Never Used  Substance and  Sexual Activity   Alcohol use: Yes    Comment: occasional   Drug use: Never   Sexual activity: Not on file  Other Topics Concern   Not on file  Social History Narrative   Not on file   Social Drivers of  Health   Financial Resource Strain: Low Risk  (10/02/2023)   Received from [facility]   Overall Financial Resource Strain (CARDIA)    Difficulty of Paying Living Expenses: Not very hard  Food Insecurity: No Food Insecurity (10/09/2023)   Hunger Vital Sign    Worried About Running Out of Food in the Last Year: Never true    Ran Out of Food in the Last Year: Never true  Transportation Needs: No Transportation Needs (10/10/2023)   PRAPARE - Administrator, Civil Service (Medical): No    Lack of Transportation (Non-Medical): No  Physical Activity: Not on file  Stress: Not on file  Social Connections: Unknown (03/25/2023)   Received from [facility]   Social Network    Social Network: Not on file  Intimate Partner Violence: Not At Risk (10/10/2023)   Humiliation, Afraid, Rape, and Kick questionnaire    Fear of Current or Ex-Partner: No    Emotionally Abused: No    Physically Abused: No    Sexually Abused: No       Objective:  There were no vitals taken for this visit. {Pulm Vitals (Optional):32837}  Physical Exam  Diagnostic Review:  {Labs (Optional):32838}    Images: CT chest 05/13/2023 Impression 1. Status post median sternotomy, without bony incorporation of the sternal halves. No sternal wire fracture. 2. Minimal emphysema and diffuse bilateral bronchial wall thickening. Very extensive fine centrilobular nodularity throughout the lungs, consistent with smoking-related respiratory bronchiolitis. 3. Mosaic attenuation of the lung bases, consistent with small airways disease. 4. Mild cardiomegaly and coronary artery disease status post median sternotomy and CABG.  CTA 01/26/2023 No evidence of pulmonary embolism. 2. Ill-defined tiny centrilobular  ground-glass densities throughout both lungs with mild mosaic attenuation. Differential considerations include respiratory bronchiolitis and hypersensitivity pneumonitis. 3.  Aortic Atherosclerosis (ICD10-I70.0).  Pulmonary Functions Testing Results: Value/zscore PFT   09/2022 FVC   2.38/-2.2 FEV1 1.68/-2.7 F/F      71% TLC     3.9/-1.6 RV 1.2/-1.3 DLCO  15.2/-1.6  09/2022: moderate obstruction, moderate restriction, Significant BD response  Eos: 0.1-0.2  Assessment & Plan:   Respiratory Bronchiolitis - smoking  Centrilobular nodules, Ggo, patchy area, bronchial wall [place] Smoking conseuling    Assessment & Plan Smoking addiction  Orders:   nicotine  (NICODERM CQ  - dosed in mg/24 hr) patch 21 mg   nicotine  polacrilex (NICORETTE) gum 4 mg    No follow-ups on file.  Full PFTs CT ILD wo cont Start LABA+ICS - advair? Smoking cessation conseling  Nicotine   .inh INHALER INSTRUCTIONS: To use the inhaler you follow these steps: Prime the inhaler according to instructions (which means waste between 1-4 doses before next use).  Follow package insert Shake the inhaler before each use Connect spacer Exhale completely by blowing all the air out of your lungs Seal your mouth around the spacer Press down on the canister then inhale SLOW and STEADY until your fill your lungs completely. Hold the breath for 6-10 seconds Gently exhale Wait 60 seconds then repeat steps 2-8. Rinse the mouth after each use to prevent thrush  Your pharmacist can also review proper technique if you have any remaining questions. Let me know if the cost is too high, your insurance may be able to recommend a more affordable option for you.       Marny Patch, MD Pulmonary and Critical Care Medicine [facility]

## 2024-04-10 ENCOUNTER — Ambulatory Visit (INDEPENDENT_AMBULATORY_CARE_PROVIDER_SITE_OTHER)

## 2024-04-10 VITALS — BP 101/71 | HR 103 | Temp 98.0°F | Ht 62.0 in | Wt 168.8 lb

## 2024-04-10 DIAGNOSIS — R1310 Dysphagia, unspecified: Secondary | ICD-10-CM | POA: Diagnosis not present

## 2024-04-10 DIAGNOSIS — J84115 Respiratory bronchiolitis interstitial lung disease: Secondary | ICD-10-CM | POA: Diagnosis not present

## 2024-04-10 DIAGNOSIS — F17210 Nicotine dependence, cigarettes, uncomplicated: Secondary | ICD-10-CM

## 2024-04-10 DIAGNOSIS — F17200 Nicotine dependence, unspecified, uncomplicated: Secondary | ICD-10-CM

## 2024-04-10 MED ORDER — ALBUTEROL SULFATE HFA 108 (90 BASE) MCG/ACT IN AERS: 1.0000 | INHALATION_SPRAY | Freq: Four times a day (QID) | RESPIRATORY_TRACT | 3 refills | Status: AC | PRN

## 2024-04-10 MED ORDER — NICOTINE POLACRILEX 2 MG MT GUM: 4.0000 mg | CHEWING_GUM | OROMUCOSAL | Status: AC | PRN

## 2024-04-10 MED ORDER — NICOTINE 7 MG/24HR TD PT24: 21.0000 mg | MEDICATED_PATCH | Freq: Every day | TRANSDERMAL | Status: AC

## 2024-04-10 NOTE — Patient Instructions (Addendum)
 Dear Ms. Kloey,  -We will  wait to see your CT scan results, please schedule an appointment with radiology. Send me a text with mychart when your CT scan is done.  -I prescribe albuterol  inhaler, use it as need it for shortness of breath. Attached inhaler instructions. -We have an extensive discussion stopping smoking and using your nicotine  gum/patch for craving. -I will order a swallowing study - FEES.  -I will see you in 4 months.  INHALER INSTRUCTIONS: To use the inhaler you follow these steps: Prime the inhaler according to instructions (which means waste between 1-4 doses before next use).  Follow package insert Shake the inhaler before each use Connect spacer Exhale completely by blowing all the air out of your lungs Seal your mouth around the spacer Press down on the canister then inhale SLOW and STEADY until your fill your lungs completely. Hold the breath for 6-10 seconds Gently exhale Wait 60 seconds then repeat steps 2-8.  Your pharmacist can also review proper technique if you have any remaining questions.

## 2024-04-21 ENCOUNTER — Ambulatory Visit (HOSPITAL_COMMUNITY)
Admission: RE | Admit: 2024-04-21 | Discharge: 2024-04-21 | Disposition: A | Discharge status: Home / Self Care | Source: Ambulatory Visit | Attending: Cardiology | Admitting: Cardiology

## 2024-04-21 ENCOUNTER — Ambulatory Visit (HOSPITAL_COMMUNITY)
Admission: RE | Admit: 2024-04-21 | Discharge: 2024-04-21 | Disposition: A | Discharge status: Home / Self Care | Source: Ambulatory Visit | Attending: Otolaryngology | Admitting: Otolaryngology

## 2024-04-21 ENCOUNTER — Encounter (HOSPITAL_COMMUNITY): Payer: Self-pay

## 2024-04-21 DIAGNOSIS — K219 Gastro-esophageal reflux disease without esophagitis: Secondary | ICD-10-CM | POA: Insufficient documentation

## 2024-04-21 DIAGNOSIS — R079 Chest pain, unspecified: Secondary | ICD-10-CM | POA: Insufficient documentation

## 2024-04-21 MED ORDER — IOHEXOL 300 MG/ML  SOLN
75.0000 mL | Freq: Once | INTRAMUSCULAR | Status: AC | PRN
  Administered 2024-04-21: 75 mL via INTRAVENOUS

## 2024-04-21 MED ORDER — SODIUM CHLORIDE (PF) 0.9 % IJ SOLN
INTRAMUSCULAR | Status: AC
  Filled 2024-04-21: qty 50

## 2024-05-03 NOTE — Progress Notes (Deleted)
 No chief complaint on file.  History of Present Illness: 53 yo female with history of CAD s/p 3V CABG, atrial fibrillation, ischemic cardiomyopathy, anxiety, fibromyalgia, GERD, arthritis, HLD, tobacco abuse here today for cardiac follow up. She was seen as a new patient for the evaluation of her cardiac risk factors in March 2021. Normal exercise stress test March 2021. Echo March 2021 with LVEF=50-55%. No valve disease. Cardiac monitor April 2021 with sinus rhythm, 2 short runs of VT (longest 9 beats), 3 short runs of SVt (longest 12 beats).  She was told that she had atrial fibrillation back in 2023. 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 Echo January 2024 with LVEF=35-40%. Mild to moderate mitral valve regurgitation. Coronary CTA January 2024 with moderate 3 vessel CAD. Cardiac cath 09/07/22 with severe LAD and Diagonal disease. She underwent 4V CABG on 10/08/22 with overall uneventful postoperative course. Also with pulmonary vein isolation ablation and ligation of the left atrial appendage at the time of her surgery. Echo June 2024 with normal LV function and no significant valve disease. I saw her in the office in August 2024 and she was doing well. She was admitted to [facility] in March 2025 with a NSTEMI.  Cardiac cath March 2025 with chronic occlusion of the mid LAD and proximal Circumflex with patent LIMA to LAD, patent SVG to Diagonal, patent SVG to OM. Severe proximal RCA stenosis treated with a drug eluting stent. Echo March 2025 with LVEF=40-45%. Normal RV function. No valve disease. She was seen in our office in August 2025 with c/o atypical chest pain that was not felt to be cardiac related. She reported issues with swallowing and coughing. Recent GI workup with esophagitis. She was referred to Pulmonary for consideration of bronchoscopy following intubation with bypass surgery to exclude structural issues leading to aspiration. She was seen in the ED at [facility] on 03/21/24 with c/o blood pressure fluctuation. She  also described epigastric pain. Troponin was negative.  Her pain was not felt to be cardiac related. She was seen in our office 03/23/24 and reported chest pain when her blood pressure goes up. Toprol  dose lowered secondary to fatigue.   She is here today for follow up. The patient denies any chest pain, dyspnea, palpitations, lower extremity edema, orthopnea, PND, dizziness, near syncope or syncope.   Primary Care Physician: Arloa Elsie SAUNDERS, MD  Past Medical History:  Diagnosis Date   A-fib [facility])    Anxiety    Arthralgia    Asthma    Attention-deficit hyperactivity disorder, predominantly inattentive type    Back pain    Coronary artery disease    Dysrhythmia    Afib   Fear of flying    Fibromyalgia    denies having Fibromyalgia   Gastroesophageal reflux disease    HPV in female    Joint pain    Localized edema    Neck pain    OA (osteoarthritis)    Papillomavirus as the cause of diseases classified elsewhere    Primary insomnia    Pure hypercholesterolemia    Rheumatoid arthritis, seronegative, hand, unspecified laterality (HCC)    patient feels it is back related, not a true arthritis   Spondylolisthesis of lumbosacral region    Tobacco dependence    Unspecified inflammatory spondylopathy, cervical region    Vitamin B12 deficiency anemia    Voice hoarseness     Past Surgical History:  Procedure Laterality Date   ABDOMINAL EXPOSURE N/A 06/28/2020   Procedure: ABDOMINAL EXPOSURE;  Surgeon:  Gretta Lonni PARAS, MD;  Location: [facility] OR;  Service: Vascular;  Laterality: N/A;  anterior   ANTERIOR LUMBAR FUSION N/A 06/28/2020   Procedure: ANTERIOR LUMBAR INTERBODY FUSION LUMBAR FIVE-SACRAL ONE.;  Surgeon: Joshua Alm RAMAN, MD;  Location: [facility] OR;  Service: Neurosurgery;  Laterality: N/A;  anterior approach   BIOPSY  01/28/2023   Procedure: BIOPSY;  Surgeon: Kriss Estefana DEL, DO;  Location: [facility] END[facility]COPY;  Service: Gastroenterology;;   BREAST ENHANCEMENT SURGERY     CARDIAC  CATHETERIZATION     CARPAL TUNNEL RELEASE Left    CARPAL TUNNEL RELEASE Right 06/28/2020   Procedure: CARPAL TUNNEL RELEASE;  Surgeon: Joshua Alm RAMAN, MD;  Location: [facility] OR;  Service: Neurosurgery;  Laterality: Right;   CERVICAL SPINE SURGERY     CHOLECYSTECTOMY     CLIPPING OF ATRIAL APPENDAGE N/A 10/08/2022   Procedure: CLIPPING OF ATRIAL APPENDAGE USING ATRICURE ATRICLIP PRO2 SIZE 35;  Surgeon: Lucas Dorise POUR, MD;  Location: [facility] OR;  Service: Open Heart Surgery;  Laterality: N/A;   CORONARY ARTERY BYPASS GRAFT N/A 10/08/2022   Procedure: CORONARY ARTERY BYPASS GRAFTING (CABG) X3 USING LEFT INTERNAL MAMMARY ARTERY AND RIGHT GREATER SAPHENOUS VEIN HARVESTED ENDOSCOPICALLY;  Surgeon: Lucas Dorise POUR, MD;  Location: [facility] OR;  Service: Open Heart Surgery;  Laterality: N/A;   CORONARY PRESSURE/FFR STUDY N/A 09/07/2022   Procedure: INTRAVASCULAR PRESSURE WIRE/FFR STUDY;  Surgeon: Mady Lonni, MD;  Location: [facility] INVASIVE CV LAB;  Service: Cardiovascular;  Laterality: N/A;   CORONARY STENT INTERVENTION N/A 10/09/2023   Procedure: CORONARY STENT INTERVENTION;  Surgeon: Wendel Lurena POUR, MD;  Location: [facility] INVASIVE CV LAB;  Service: Cardiovascular;  Laterality: N/A;   CORONARY/GRAFT ACUTE MI REVASCULARIZATION N/A 10/09/2023   Procedure: Coronary/Graft Acute MI Revascularization;  Surgeon: Wendel Lurena POUR, MD;  Location: [facility]VASIVE CV LAB;  Service: Cardiovascular;  Laterality: N/A;   ESOPHAGOGASTRODUODENOSCOPY N/A 01/28/2023   Procedure: ESOPHAGOGASTRODUODENOSCOPY (EGD);  Surgeon: Kriss Estefana DEL, DO;  Location: [facility];  Service: Gastroenterology;  Laterality: N/A;   FOOT SURGERY     LEFT HEART CATH AND CORONARY ANGIOGRAPHY N/A 10/09/2023   Procedure: LEFT HEART CATH AND CORONARY ANGIOGRAPHY;  Surgeon: Wendel Lurena POUR, MD;  Location: [facility]VAS[facility] CV LAB;  Service: Cardiovascular;  Laterality: N/A;   MANDIBLE SURGERY     MAZE N/A 10/08/2022   Procedure: PULMONARY VEIN ABLATION USING ATRICURE  ISOLATOR SYNERGY ENCOMPASS OLH;  Surgeon: Lucas Dorise POUR, MD;  Location: [facility] OR;  Service: Open Heart Surgery;  Laterality: N/A;   RIGHT/LEFT HEART CATH AND CORONARY ANGIOGRAPHY N/A 09/07/2022   Procedure: RIGHT/LEFT HEART CATH AND CORONARY ANGIOGRAPHY;  Surgeon: Mady Lonni, MD;  Location: [facility]VAS[facility] CV LAB;  Service: Cardiovascular;  Laterality: N/A;   TEE WITHOUT CARDIOVERSION N/A 10/08/2022   Procedure: TRANSESOPHAGEAL ECHOCARDIOGRAM;  Surgeon: Lucas Dorise POUR, MD;  Location: [facility] OR;  Service: Open Heart Surgery;  Laterality: N/A;   TONSILLECTOMY     TUBAL LIGATION     with ablation   TYMPANOSTOMY TUBE PLACEMENT      Current Outpatient Medications  Medication Sig Dispense Refill   albuterol  (VENTOLIN  HFA) 108 (90 Base) MCG/ACT inhaler Inhale 2 puffs into the lungs every 4 (four) hours as needed for wheezing or shortness of breath. 6.7 g 1   albuterol  (VENTOLIN  HFA) 108 (90 Base) MCG/ACT inhaler Inhale 1-2 puffs into the lungs every 6 (six) hours as needed for wheezing or shortness of breath (wheezing, shortness of breath). 8 g 3   ALPRAZolam (XANAX) 0.5 MG tablet  1 tablet Orally once a day for 30 days As needed (Patient taking differently: Take 0.5 mg by mouth at bedtime as needed.)     amphetamine -dextroamphetamine  (ADDERALL XR) 30 MG 24 hr capsule Take 60 mg by mouth daily.     Ascorbic Acid  500 MG CAPS as directed Orally once a day; Duration: 90 days     aspirin  EC 81 MG tablet Take 1 tablet (81 mg total) by mouth daily. Swallow whole. 90 tablet 3   aspirin -acetaminophen -caffeine (EXCEDRIN MIGRAINE) 250-250-65 MG tablet Take 2 tablets by mouth daily as needed.     atorvastatin  (LIPITOR ) 80 MG tablet Take 1 tablet (80 mg total) by mouth daily. 90 tablet 1   cholecalciferol (VITAMIN D3) 25 MCG (1000 UNIT) tablet Take 1,000 Units by mouth daily.     colestipol (COLESTID) 1 g tablet TAKE 1 TABLET (1 GRAM DOSE) BY MOUTH TWICE A DAY (Patient not taking: Reported on 04/10/2024)      cyanocobalamin  (VITAMIN B12) 1000 MCG tablet Take 1,000 mcg by mouth daily.     escitalopram  (LEXAPRO ) 10 MG tablet Take 10 mg by mouth daily as needed (anxiety).     ezetimibe  (ZETIA ) 10 MG tablet Take 1 tablet (10 mg total) by mouth daily. 90 tablet 2   furosemide  (LASIX ) 20 MG tablet Take 1 tablet (20 mg total) by mouth daily as needed (for lower extremity edema). 30 tablet 0   gabapentin  (NEURONTIN ) 400 MG capsule Take 800 mg by mouth daily as needed (for pain).     icosapent  Ethyl (VASCEPA ) 1 g capsule Take 2 capsules (2 g total) by mouth 2 (two) times daily. 120 capsule 6   losartan  (COZAAR ) 25 MG tablet Take 1 tablet (25 mg total) by mouth daily. (Patient not taking: Reported on 04/10/2024) 90 tablet 3   Magnesium  300 MG CAPS daily as needed.     metoprolol  succinate (TOPROL -XL) 50 MG 24 hr tablet Take 1 tablet (50 mg total) by mouth daily. Take with or immediately following a meal. 90 tablet 3   nicotine  (NICODERM CQ  - DOSED IN MG/24 HOURS) 21 mg/24hr patch Place 1 patch (21 mg total) onto the skin daily. 28 patch 0   nitroGLYCERIN  (NITROSTAT ) 0.4 MG SL tablet PLACE 1 TABLET UNDER THE TONGUE EVERY 5 MINUTES AS NEEDED FOR CHEST PAIN. 25 tablet 4   omeprazole (PRILOSEC) 20 MG capsule Take 20 mg by mouth daily.     podofilox (CONDYLOX) 0.5 % external solution Apply 1 Application topically 2 (two) times daily. (Patient not taking: Reported on 04/10/2024)     spironolactone  (ALDACTONE ) 25 MG tablet Take 1 tablet (25 mg total) by mouth daily. 90 tablet 3   ticagrelor  (BRILINTA ) 90 MG TABS tablet Take 1 tablet (90 mg total) by mouth 2 (two) times daily. 180 tablet 2   zolpidem  (AMBIEN ) 10 MG tablet Take 10 mg by mouth at bedtime.     Current Facility-Administered Medications  Medication Dose Route Frequency Provider Last Rate Last Admin   lactulose  (CHRONULAC ) 10 GM/15ML solution 20 g  20 g Oral Daily PRN Bartle, Bryan K, MD       nicotine  (NICODERM CQ  - dosed in mg/24 hr) patch 21 mg  21 mg  Transdermal Daily        nicotine  polacrilex (NICORETTE) gum 4 mg  4 mg Oral PRN         Allergies  Allergen Reactions   Zoloft [Sertraline] Other (See Comments)    Altered mental state  Social History   Socioeconomic History   Marital status: Married    Spouse name: Not on file   Number of children: 2   Years of education: 13   Highest education level: Some college, no degree  Occupational History   Occupation: unemployed  Tobacco Use   Smoking status: Every Day    Current packs/day: 1.00    Average packs/day: 1 pack/day for 31.8 years (31.8 ttl pk-yrs)    Types: Cigarettes    Start date: 1994   Smokeless tobacco: Never   Tobacco comments:    Has smoked about 20 years, 1ppd.  Vaping Use   Vaping status: Never Used  Substance and Sexual Activity   Alcohol use: Yes    Comment: occasional   Drug use: Never   Sexual activity: Not on file  Other Topics Concern   Not on file  Social History Narrative   Not on file   Social Drivers of Health   Financial Resource Strain: Low Risk  (10/02/2023)   Received from [facility]   Overall Financial Resource Strain (CARDIA)    Difficulty of Paying Living Expenses: Not very hard  Food Insecurity: No Food Insecurity (10/09/2023)   Hunger Vital Sign    Worried About Running Out of Food in the Last Year: Never true    Ran Out of Food in the Last Year: Never true  Transportation Needs: No Transportation Needs (10/10/2023)   PRAPARE - Administrator, Civil Service (Medical): No    Lack of Transportation (Non-Medical): No  Physical Activity: Not on file  Stress: Not on file  Social Connections: Unknown (03/25/2023)   [facility] from [facility]   Social Network    Social Network: Not on file  Intimate Partner Violence: Not At Risk (10/10/2023)   Humiliation, Afraid, Rape, and Kick questionnaire    Fear of Current or Ex-Partner: No    Emotionally Abused: No    Physically Abused: No    Sexually Abused: No     Family History  Problem Relation Age of Onset   Hypertension Father    Heart disease Father     Review of Systems:  As stated in the HPI and otherwise negative.   There were no vitals taken for this visit.  Physical Examination: General: Well developed, well nourished, NAD  HEENT: OP clear, mucus membranes moist  SKIN: warm, dry. No rashes. Neuro: No focal deficits  Musculoskeletal: Muscle strength 5/5 all ext  Psychiatric: Mood and affect normal  Neck: No JVD, no carotid bruits, no thyromegaly, no lymphadenopathy.  Lungs:Clear bilaterally, no wheezes, rhonci, crackles Cardiovascular: Regular rate and rhythm. No murmurs, gallops or rubs. Abdomen:Soft. Bowel sounds present. Non-tender.  Extremities: No lower extremity edema. Pulses are 2 + in the bilateral DP/PT.  EKG:  EKG is *** ordered today. The ekg ordered today demonstrates   Recent Labs: 10/19/2023: Magnesium  2.2 01/12/2024: BNP 80.1 03/21/2024: ALT 29; BUN 13; Creatinine, Ser 0.70; Hemoglobin 14.6; Platelets 212; Potassium 3.2; Sodium 138   Lipid Panel    Component Value Date/Time   CHOL 113 02/29/2024 1537   TRIG 192 (H) 02/29/2024 1537   HDL 45 02/29/2024 1537   CHOLHDL 2.5 02/29/2024 1537   CHOLHDL 4.3 10/10/2023 0345   VLDL 69 (H) 10/10/2023 0345   LDLCALC 37 02/29/2024 1537     Wt Readings from Last 3 Encounters:  04/10/24 168 lb 12.8 oz (76.6 kg)  03/23/24 168 lb 3.2 oz (76.3 kg)  02/29/24 175 lb  9.6 oz (79.7 kg)    Assessment and Plan:   1. CAD s/p CABG without angina: No chest pain suggestive of angina. Continue ASA, Brilinta , Lipitor , Zetia  and Toprol .     2. Hyperlipidemia: LDL 37 in August 2025. Continue Lipitor  and Zetia .     3. Palpitations/SVT/PVCs/Possible atrial fibrillation: Cardiac monitor February 2024 with no evidence of atrial fib. No recent palpitations.   4. Ischemic cardiomyopathy: LVEF=40-45% by echo March 2025 (up from 35% pre-CABG). Continue Losartan , Toprol  and aldactone .    5. HTN: BP is controlled.   Labs/ tests ordered today include:  No orders of the defined types were placed in this encounter.  Disposition:   F/U with me in 12 months.    Signed, Lonni Cash, MD 05/03/2024 4:46 PM    [facility] [address] [place], [place], [place]  [address] Phone: [phone]; Fax: [phone]

## 2024-05-04 ENCOUNTER — Ambulatory Visit: Attending: Internal Medicine | Admitting: Cardiovascular Disease

## 2024-06-07 ENCOUNTER — Telehealth: Payer: Self-pay | Admitting: Cardiovascular Disease

## 2024-06-07 NOTE — Telephone Encounter (Signed)
 Called and spoke to pt. She is having low BP's. She is lightheaded, but has not passed out. Denies CP, SOB, palpitations. She feels this is a medication issue. She last took Lasix  20 mg on 06/05/24 for swelling in her feet and reports she lost 8 lbs.   Today BP's 117\75 HR 90 88\57 HR 100 92\59 HR 101  While on the phone, pt shared more recent BP's 76/44 75/50 77/45  HR 125  Advised pt to go to the nearest ER for further evaluation. Pt hesitant and would like to know if MD or PA can adjust meds.

## 2024-06-07 NOTE — Telephone Encounter (Signed)
 1. What is your BP concern? Pt states that BP has been running low.    2. Have you taken any BP medication today? yes   3. What are your last 5 BP readings? 117\75 90 88\57 100 92\59 101    4. Are you having any other symptoms (ex. Dizziness, headache, blurred vision, passed out)?  Headache and Dizziness. When she gets up feels like she may pass out.

## 2024-06-07 NOTE — Telephone Encounter (Addendum)
 Spoke to patient [name]'s advice given.She will stop Losartan .Decrease Metoprolol  1/2 tablet 25 mg daily. Advised to stay well hydrated. Advised if symptoms worsen she will need to go to ED.Appointment scheduled with Orren 12/23 at 10:55 am.

## 2024-06-19 ENCOUNTER — Other Ambulatory Visit: Payer: Self-pay

## 2024-06-19 DIAGNOSIS — I10 Essential (primary) hypertension: Secondary | ICD-10-CM

## 2024-06-19 DIAGNOSIS — R002 Palpitations: Secondary | ICD-10-CM

## 2024-06-19 DIAGNOSIS — I493 Ventricular premature depolarization: Secondary | ICD-10-CM

## 2024-06-19 DIAGNOSIS — I471 Supraventricular tachycardia, unspecified: Secondary | ICD-10-CM

## 2024-06-19 DIAGNOSIS — I4891 Unspecified atrial fibrillation: Secondary | ICD-10-CM

## 2024-06-19 DIAGNOSIS — I255 Ischemic cardiomyopathy: Secondary | ICD-10-CM

## 2024-06-19 DIAGNOSIS — I2510 Atherosclerotic heart disease of native coronary artery without angina pectoris: Secondary | ICD-10-CM

## 2024-06-20 ENCOUNTER — Encounter: Payer: Self-pay | Admitting: Podiatry

## 2024-06-20 ENCOUNTER — Ambulatory Visit

## 2024-06-20 ENCOUNTER — Ambulatory Visit: Admitting: Podiatry

## 2024-06-20 DIAGNOSIS — G5751 Tarsal tunnel syndrome, right lower limb: Secondary | ICD-10-CM | POA: Diagnosis not present

## 2024-06-20 DIAGNOSIS — M722 Plantar fascial fibromatosis: Secondary | ICD-10-CM

## 2024-06-20 DIAGNOSIS — M79672 Pain in left foot: Secondary | ICD-10-CM | POA: Diagnosis not present

## 2024-06-20 DIAGNOSIS — M79671 Pain in right foot: Secondary | ICD-10-CM

## 2024-06-20 MED ORDER — TRIAMCINOLONE ACETONIDE 40 MG/ML IJ SUSP
40.0000 mg | Freq: Once | INTRAMUSCULAR | Status: AC
  Administered 2024-06-20: 40 mg

## 2024-06-20 NOTE — Progress Notes (Signed)
 Patient presents with complaint of cold sensation and tenderness sometimes over the medial aspects of the foot both dorsally and plantarly.  Began several months ago.  Does not did have a fall in the shower and bruised her hip.  She has had no problems with the back in the past with spondylopathies.   Physical exam:  General appearance: Pleasant, and in no acute distress. AOx3.  Vascular: Pedal pulses: DP 2/4 bilaterally, PT 2/4 bilaterally. mild edema lower legs bilaterally. Capillary fill time immediate bilaterally.  Neurological: Light touch intact feet bilaterally.  Normal Achilles reflex bilaterally.  Vibratory sensation intact feet bilaterally.  Monofilament sensation intact feet bilaterally.  Positive Tinel's sign with percussion over the distal tarsal tunnel at the porta pedis right.  She mapped out the area of cold sensation is in the L4 nerve distribution bilaterally in the foot.  Dermatologic:   Skin normal temperature bilaterally.  Skin normal color, tone, and texture bilaterally.   Musculoskeletal: Slight soreness at the plantar medial calcaneal tubercle bilaterally.  No tenderness lateral compression of calcaneus normal muscle strength lower extremity bilaterally  Radiographs: 3 views feet bilaterally: Osteophytic changes plantar aspect calcaneus.  No evidence of stress fractures.  Normal bone density.  No Insa bone tumors.  No subluxations or fractures or dislocations noted.  Diagnosis: 1.  Pain feet bilaterally. 2.  Tarsal tunnel syndrome distal foot right. 3.  Plantar fasciitis bilaterally  Plan: -New patient office visit for evaluation management level 3. Modifier 25. - Discussed with problems in the feet.  She could be getting a nerve impingement on the distal tarsal tunnel in the calcaneal branch of the lateral plantar nerve.  However some of her symptoms also sound like it could be an L4 radiculopathy.  Will try an injection at the calcaneal branch of the lateral  plantar nerve and distal tarsal tunnel right today and see how she responds.  She has no change in symptoms with this we will do a neurology consult -injected 3cc 2:1 mixture 0.5 cc Marcaine : Triamcinolone  40mg /50ml at calcaneal branch lateral plantar nerve right porta pedis.    Return weeks follow-up injection porta pedis right

## 2024-06-22 MED ORDER — ICOSAPENT ETHYL 1 G PO CAPS: 2.0000 g | ORAL_CAPSULE | Freq: Two times a day (BID) | ORAL | 1 refills | Status: AC

## 2024-07-03 NOTE — Progress Notes (Unsigned)
 "  Office Visit    Patient Name: [name] Date of Encounter: 07/03/2024  PCP:  Arloa Elsie SAUNDERS, MD   [facility]  Cardiologist:  Lonni Cash, MD  Advanced Practice Provider:  No care team member to display Electrophysiologist:  None   HPI    [name] is a 53 y.o. female with a past medical history significant for anxiety, fibromyalgia, GERD, arthritis, HLD, rheumatoid arthritis, tobacco abuse presents today for overdue follow-up appointment.  She was seen initially March 2021.  She had palpitations at home.  She felt like her heart was skipping a beat.  She also had dyspnea on exertion.  No chest pain.  She was very anxious.  She smokes 1 pack a day.  She had a normal exercise stress test March 2021.  Echo March 2021 with LVEF 50 to 55%.  No valve disease.  Cardiac monitor April 2021 with sinus rhythm, 2 short runs of VT (longest 9 beats), 3 short runs of SVT (longest 12 beats).  She is here for overdue follow-up.  She was last seen April 2021 and at that time she denied any CV symptoms.  She does have some palpitations and was having severe back pain at that time.  She was last seen 06/18/22 by myself and she told me that she had an episode of a racing heartbeat and felt so bad that she immediately reported it to a fire station.  There, they did an EKG and told her she was in atrial fibrillation and advised that she go to the ED for further evaluation.  She has had other episodes of this racing heartbeat.  She also sometimes endorses a feeling of her heart stopping and her waiting for it to restart.  She was taken off her Cardizem  but she cannot remember why.  She also states that her blood pressure has been elevated especially her diastolic blood pressure.  Reports 170/100 and 150/100.  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  She does endorse insignificant  bleeding from the stones.  This has been going on for 5 years per patient report.  She was seen by me 07/24/21, She had a significant finding on her echocardiogram which revealed a reduced EF and some wall regional motion abnormality.  A coronary CTA scan will be arranged today.  She still is having some SOB. She has felt horrible from a URI/possible bronchitis. She went to her PCP's office and they did not want to start her on anything due to her Afib and the workup that we are doing. We discussed next steps will be determined after the CT scan. She can place the zio (which she already has) after her coronary CT scan. Her BP is elevated today here in the clinic and has been at home per the patient. She thought it was due to the Eliquis  however, I explained that Eliquis  should not affect BP. We discussed increasing her Cardizem  today.  I saw her back in January 2024 and she states that she is under a lot of stress and her husband makes it worse.  She states that stress from taking care of him has started all of these health problems for her.  She is worried that she will drop dead.  She did take some magnesium  and potassium and it relieved the pressure in her chest.  She is also on aspirin   325 mg.  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  She has got her husband off financially and emotionally because he is pulling her down from a health standpoint.  She is still having chest pains and a lot of shortness of breath.  Her shortness of breath is not proportional to any of the cardiac findings as far.  We reviewed her CT scan as well as her echocardiogram again.  She states that she just is running out of oxygen.  No swelling.  Weight has been stable.  She also is short of breath when eating and bending over.  She was given a large dose of metoprolol  for the CT scan and it did not touch her heart rate.  She then needed to take a few nitroglycerin  tabs and that lowered her heart rate.  She was seen by me 09/01/22,  she shares with me that he is very fatigued. She is limiting her activity and gets out of breath easily. Scared to get on a treadmill or do any physical activity.  We have had issues receiving her ZIO monitor x 2. Once in December we never got results and now her monitor from January. She tells me she dropped off at [employer].  There has been some miscommunication.  I have asked her to contact [employer] to see what happened.  We also discussed cardiac catheterization to determine what is causing her symptoms.  Her heart rate remains elevated today.  She remains on Cardizem  for blood pressure and heart rate control.  We have started her on daily Lipitor  and we will obtain an updated lipid panel.  Cardiac cath on 09/07/2022 showed sequential 90% and 70% lesions in the LAD flanking and aneurysmal segment where a large second diagonal branch arises from.  Second diagonal branch had 70% proximal stenosis.  The right coronary artery had segmental 60% concentric mid vessel stenosis.  The RFR in the mid RCA was 0.94.  RFR of the LAD was 0.46.  Left heart filling pressures were elevated with LVEDP of 17 and pulmonary capillary wedge of 13.  Right heart pressures were normal.  She underwent elective coronary bypass grafting x 4 on 10/08/2022 without complication.  She was started on nicotine  patch per request.  She had significant volume excess.  She was eventually diuresed back to baseline.  A 6-minute walk test confirmed the need for supplemental oxygen at home.  Otherwise, uneventful hospitalization.  I saw her 10/2022, she tells me that she is having some nausea and vomiting.  She experienced this before surgery and that usually happens in the morning.  She stated in the hospital she was drinking some apple juice and was choking.  She had to get assistance right away.  She states that she is taking her omeprazole.  She also tells me that she has had a lot of pins and needle feeling in her leg and in her chest since surgery.  She  was given gabapentin  to take and she has been requiring this every few hours.  She plans to discuss this with the surgeon on Wednesday.  She does not feel much difference at this current moment but hopes to feel better in a couple of weeks once she has recovered.  She states she has not had normal bowel movement since being home and says she does not of the gallbladder they tend to be mostly bile.  We discussed adding on a KUB since she is getting her chest x-ray today that way Dr.  Bartle can look it over when she sees him.  Reports no shortness of breath nor dyspnea on exertion. Reports no chest pain, pressure, or tightness. No edema, orthopnea, PND. Reports no palpitations.   Dr. Verlin saw her August 2024 and at that time her blood pressure has been consistently over 140 at home.  Recent undergone CABG x 4 back in March 2024.  Heart rate was in the low 100s.  Denied chest pain and shortness of breath.  She was seen in the ER March 2025 with chest pain.  Troponin levels were trending down and EKG revealed no other significant abnormalities from baseline.  She underwent successful angioplasty and stenting of the dominant RCA which was not bypassed previously.  She was recommended to continue high intensity statins with goal of LDL less than 70 and recommended to continue DAPT for a year.  I saw her in April 2025, she presents with a history of cardiac issues including a recent heart attack, presents with ongoing chest pain. The patient describes the pain as sharp and similar to the pain experienced during her recent heart attack. The patient also reports a history of atypical symptoms preceding cardiac events, including stomach pain and sensations similar to heartburn.  The patient has been trying to manage her anxiety, which she describes as feeling on edge and having difficulty calming down. She has been taking Lexapro  daily and Xanax as needed, but is considering a more consistent medication  regimen to manage her anxiety levels.  The patient also reports high cholesterol and triglycerides, despite efforts to improve her diet. She has been taking Zetia  for cholesterol management and has been recommended to try Vascepa , a high-dose fish oil supplement.  The patient has been experiencing difficulty in getting appropriate care for her symptoms, with challenges in getting timely appointments with specialists and confusion over her symptoms' origins. She expresses frustration over her symptoms being dismissed as gastrointestinal issues when she believes they are cardiac-related.  Reports no shortness of breath nor dyspnea on exertion.  No edema, orthopnea, PND. Reports no palpitations.   Discussed the use of AI scribe software for clinical note transcription with the patient, who gave verbal consent to proceed.  I saw her 02/2024, she presents with coronary artery disease whose chief complaint is aspiration and throat issues.  She experiences aspiration once or twice a week since her bypass surgery, with a persistent dry throat and frequent aspiration during sleep. An endoscopy revealed gastritis and esophagitis, and she is on omeprazole, but symptoms persist.  She has coronary artery disease and underwent bypass surgery. Concerns about the status of her bypass grafts persist. Dizziness and fatigue are ongoing issues. A previous heart attack and cardiac catheterization showed some blockages.  She experienced a fall in the shower due to dizziness, resulting in bruises but no head injury. Frequent dizziness and nausea occur, with fatigue and a feeling of being 'high' until the afternoon. She is on metoprolol  50 mg twice a day, which she believes contributes to her fatigue.  She is currently taking Lexapro  and Xanax for anxiety. Xanax helps with anxiety symptoms, particularly when feeling jittery, but Lexapro  is not perceived as effective.  Reports no shortness of breath nor dyspnea on  exertion. Reports no chest pain, pressure, or tightness. No edema, orthopnea, PND. Reports no palpitations.   Discussed the use of AI scribe software for clinical note transcription with the patient, who gave verbal consent to proceed.  Today, she presents with a hx of hypertension  and coronary artery disease with persistent nausea and vomiting.  She has daily nausea and vomiting that can be severe, with about fifteen episodes two days ago after a small amount of water. Symptoms usually begin four to six hours after her morning medications of aspirin , Zetia , spironolactone , and Adderall, and she notes nausea worsened when she took these at night. She is concerned these medications are contributing to her symptoms.  Her current regimen also includes Vascepa  twice daily, metoprolol , atorvastatin , and Prilosec at night, which she tolerates. Despite metoprolol  for blood pressure and heart rate control, she notes variable blood pressure and episodes of elevated heart rate up to 135 bpm.  She had been on 25 mg of losartan  which on further review was increased by Dr. Verlin back in September.  However, she has not taken any medications today and her blood pressure is on the low end of normal.  She reports dizziness and episodes of tunnel vision or an out of body sensation that she associates with blood pressure fluctuations. She takes Lexapro  daily and Xanax as needed for anxiety.  She notes worsening neuropathy symptoms with reduced sensation in her legs and cold sensations in her hands and feet.  Her mother has high cholesterol. Her recent lipid panel showed triglycerides 144 mg/dL, HDL 63 mg/dL, and LDL 13 mg/dL.  Reports no shortness of breath nor dyspnea on exertion. Reports no chest pain, pressure, or tightness. No edema, orthopnea, PND. Reports no palpitations.    Discussed the use of AI scribe software for clinical note transcription with the patient, who gave verbal consent to proceed.     Past Medical History    Past Medical History:  Diagnosis Date   A-fib (HCC)    Anxiety    Arthralgia    Asthma    Attention-deficit hyperactivity disorder, predominantly inattentive type    Back pain    Coronary artery disease    Dysrhythmia    Afib   Fear of flying    Fibromyalgia    denies having Fibromyalgia   Gastroesophageal reflux disease    HPV in female    Joint pain    Localized edema    Neck pain    OA (osteoarthritis)    Papillomavirus as the cause of diseases classified elsewhere    Primary insomnia    Pure hypercholesterolemia    Rheumatoid arthritis, seronegative, hand, unspecified laterality (HCC)    patient feels it is back related, not a true arthritis   Spondylolisthesis of lumbosacral region    Tobacco dependence    Unspecified inflammatory spondylopathy, cervical region    Vitamin B12 deficiency anemia    Voice hoarseness    Past Surgical History:  Procedure Laterality Date   ABDOMINAL EXPOSURE N/A 06/28/2020   Procedure: ABDOMINAL EXPOSURE;  Surgeon: Gretta Lonni PARAS, MD;  Location: [facility] OR;  Service: Vascular;  Laterality: N/A;  anterior   ANTERIOR LUMBAR FUSION N/A 06/28/2020   Procedure: ANTERIOR LUMBAR INTERBODY FUSION LUMBAR FIVE-SACRAL ONE.;  Surgeon: Joshua Alm RAMAN, MD;  Location: [facility] OR;  Service: Neurosurgery;  Laterality: N/A;  anterior approach   BIOPSY  01/28/2023   Procedure: BIOPSY;  Surgeon: Kriss Estefana DEL, DO;  Location: [facility];  Service: Gastroenterology;;   BREAST ENHANCEMENT SURGERY     CARDIAC CATHETERIZATION     CARPAL TUNNEL RELEASE Left    CARPAL TUNNEL RELEASE Right 06/28/2020   Procedure: CARPAL TUNNEL RELEASE;  Surgeon: Joshua Alm RAMAN, MD;  Location: [facility] OR;  Service: Neurosurgery;  Laterality:  Right;   CERVICAL SPINE SURGERY     CHOLECYSTECTOMY     CLIPPING OF ATRIAL APPENDAGE N/A 10/08/2022   Procedure: CLIPPING OF ATRIAL APPENDAGE USING ATRICURE ATRICLIP PRO2 SIZE 35;  Surgeon: Lucas Dorise POUR, MD;   Location: [facility] OR;  Service: Open Heart Surgery;  Laterality: N/A;   CORONARY ARTERY BYPASS GRAFT N/A 10/08/2022   Procedure: CORONARY ARTERY BYPASS GRAFTING (CABG) X3 USING LEFT INTERNAL MAMMARY ARTERY AND RIGHT GREATER SAPHENOUS VEIN HARVESTED ENDOSCOPICALLY;  Surgeon: Lucas Dorise POUR, MD;  Location: [facility] OR;  Service: Open Heart Surgery;  Laterality: N/A;   CORONARY PRESSURE/FFR STUDY N/A 09/07/2022   Procedure: INTRAVASCULAR PRESSURE WIRE/FFR STUDY;  Surgeon: Mady Bruckner, MD;  Location: [facility];  Service: Cardiovascular;  Laterality: N/A;   CORONARY STENT INTERVENTION N/A 10/09/2023   Procedure: CORONARY STENT INTERVENTION;  Surgeon: Wendel Lurena POUR, MD;  Location: [facility];  Service: Cardiovascular;  Laterality: N/A;   CORONARY/GRAFT ACUTE MI REVASCULARIZATION N/A 10/09/2023   Procedure: Coronary/Graft Acute MI Revascularization;  Surgeon: Wendel Lurena POUR, MD;  Location: [facility];  Service: Cardiovascular;  Laterality: N/A;   ESOPHAGOGASTRODUODENOSCOPY N/A 01/28/2023   Procedure: ESOPHAGOGASTRODUODENOSCOPY (EGD);  Surgeon: Kriss Estefana DEL, DO;  Location: [facility];  Service: Gastroenterology;  Laterality: N/A;   FOOT SURGERY     LEFT HEART CATH AND CORONARY ANGIOGRAPHY N/A 10/09/2023   Procedure: LEFT HEART CATH AND CORONARY ANGIOGRAPHY;  Surgeon: Wendel Lurena POUR, MD;  Location: [facility];  Service: Cardiovascular;  Laterality: N/A;   MANDIBLE SURGERY     MAZE N/A 10/08/2022   Procedure: PULMONARY VEIN ABLATION USING ATRICURE ISOLATOR SYNERGY ENCOMPASS OLH;  Surgeon: Lucas Dorise POUR, MD;  Location: [facility] OR;  Service: Open Heart Surgery;  Laterality: N/A;   RIGHT/LEFT HEART CATH AND CORONARY ANGIOGRAPHY N/A 09/07/2022   Procedure: RIGHT/LEFT HEART CATH AND CORONARY ANGIOGRAPHY;  Surgeon: Mady Bruckner, MD;  Location: [facility];  Service: Cardiovascular;  Laterality: N/A;   TEE WITHOUT CARDIOVERSION N/A 10/08/2022   Procedure:  TRANSESOPHAGEAL ECHOCARDIOGRAM;  Surgeon: Lucas Dorise POUR, MD;  Location[facility] OR;  Service: Open Heart Surgery;  Laterality: N/A;   TONSILLECTOMY     TUBAL LIGATION     with ablation   TYMPANOSTOMY TUBE PLACEMENT      Allergies  Allergies  Allergen Reactions   Zoloft [Sertraline] Other (See Comments)    Altered mental state     EKGs/Labs/Other Studies Reviewed:   The following studies were reviewed today:  Echocardiogram 07/20/2021  IMPRESSIONS     1. Left ventricular ejection fraction, by estimation, is 35 to 40%. The  left ventricle has moderately decreased function. The left ventricle  demonstrates regional wall motion abnormalities (see scoring  diagram/findings for description). The left  ventricular internal cavity size was mildly dilated. Left ventricular  diastolic parameters are indeterminate. Mild global hypokinesis with  moderate hypokinesis of septal wall.   2. Right ventricular systolic function is normal. The right ventricular  size is normal.   3. Left atrial size was moderately dilated.   4. The mitral valve is normal in structure. Mild to moderate mitral valve  regurgitation. No evidence of mitral stenosis.   5. The aortic valve is grossly normal. There is mild calcification of the  aortic valve. Aortic valve regurgitation is not visualized. Aortic valve  sclerosis is present, with no evidence of aortic valve stenosis.   6. The inferior vena cava is normal in size with greater than 50%  respiratory variability, suggesting right atrial pressure of 3 mmHg.   7. Cannot exclude a small PFO.   Comparison(s): Prior images reviewed side by side. Changes from prior  study are noted. Prior images with normal EF, maybe minimal abnormal  septal motion. EF reduced and septal motion worse compared to prior.   Conclusion(s)/Recommendation(s): Significantly changed compared to prior,  will contact Orren Fabry and Dr. Verlin.   FINDINGS   Left Ventricle: Left  ventricular ejection fraction, by estimation, is 35  to 40%. The left ventricle has moderately decreased function. The left  ventricle demonstrates regional wall motion abnormalities. Moderate  hypokinesis of the left ventricular,  entire anteroseptal wall and inferoseptal wall. Global longitudinal strain  performed but not reported based on interpreter judgement due to  suboptimal tracking. 3D left ventricular ejection fraction analysis  performed but not reported based on  interpreter judgement due to suboptimal tracking. The left ventricular  internal cavity size was mildly dilated. There is no left ventricular  hypertrophy. Left ventricular diastolic parameters are indeterminate.     LV Wall Scoring:  Mild global hypokinesis with moderate hypokinesis of septal wall.   Right Ventricle: The right ventricular size is normal. No increase in  right ventricular wall thickness. Right ventricular systolic function is  normal.   Left Atrium: Left atrial size was moderately dilated.   Right Atrium: Right atrial size was normal in size.   Pericardium: There is no evidence of pericardial effusion.   Mitral Valve: The mitral valve is normal in structure. There is mild  thickening of the mitral valve leaflet(s). Mild to moderate mitral valve  regurgitation. No evidence of mitral valve stenosis.   Tricuspid Valve: The tricuspid valve is normal in structure. Tricuspid  valve regurgitation is trivial. No evidence of tricuspid stenosis.   Aortic Valve: The aortic valve is grossly normal. There is mild  calcification of the aortic valve. Aortic valve regurgitation is not  visualized. Aortic valve sclerosis is present, with no evidence of aortic  valve stenosis.   Pulmonic Valve: The pulmonic valve was grossly normal. Pulmonic valve  regurgitation is trivial. No evidence of pulmonic stenosis.   Aorta: The aortic root, ascending aorta, aortic arch and descending aorta  are all structurally  normal, with no evidence of dilitation or  obstruction.   Venous: The inferior vena cava is normal in size with greater than 50%  respiratory variability, suggesting right atrial pressure of 3 mmHg.   IAS/Shunts: Cannot exclude a small PFO.    Echocardiogram 09/28/19 IMPRESSIONS     1. Left ventricular ejection fraction, by estimation, is 50 to 55%. The  left ventricle has low normal function. The left ventricle has no regional  wall motion abnormalities. The left ventricular internal cavity size was  mildly dilated. Left ventricular  diastolic parameters are indeterminate. The average left ventricular  global longitudinal strain is -17.6 %.   2. Right ventricular systolic function is normal. The right ventricular  size is normal. There is normal pulmonary artery systolic pressure.   3. The mitral valve is normal in structure. Trivial mitral valve  regurgitation. No evidence of mitral stenosis.   4. The aortic valve is tricuspid. Aortic valve regurgitation is not  visualized. No aortic stenosis is present.   5. The inferior vena cava is normal in size with greater than 50%  respiratory variability, suggesting right atrial pressure of 3 mmHg.   FINDINGS   Left Ventricle: Left ventricular ejection fraction, by estimation, is 50  to 55%.  The left ventricle has low normal function. The left ventricle has  no regional wall motion abnormalities. The average left ventricular global  longitudinal strain is -17.6  %. The left ventricular internal cavity size was mildly dilated. There is  no left ventricular hypertrophy. Left ventricular diastolic parameters are  indeterminate.   Right Ventricle: The right ventricular size is normal. No increase in  right ventricular wall thickness. Right ventricular systolic function is  normal. There is normal pulmonary artery systolic pressure. The tricuspid  regurgitant velocity is 2.03 m/s, and   with an assumed right atrial pressure of 3 mmHg, the  estimated right  ventricular systolic pressure is 19.5 mmHg.   Left Atrium: Left atrial size was normal in size.   Right Atrium: Right atrial size was normal in size.   Pericardium: There is no evidence of pericardial effusion.   Mitral Valve: The mitral valve is normal in structure. Normal mobility of  the mitral valve leaflets. Trivial mitral valve regurgitation. No evidence  of mitral valve stenosis.   Tricuspid Valve: The tricuspid valve is normal in structure. Tricuspid  valve regurgitation is trivial. No evidence of tricuspid stenosis.   Aortic Valve: The aortic valve is tricuspid. Aortic valve regurgitation is  not visualized. No aortic stenosis is present.   Pulmonic Valve: The pulmonic valve was normal in structure. Pulmonic valve  regurgitation is not visualized. No evidence of pulmonic stenosis.   Aorta: The aortic root is normal in size and structure.   Venous: The inferior vena cava is normal in size with greater than 50%  respiratory variability, suggesting right atrial pressure of 3 mmHg.   IAS/Shunts: No atrial level shunt detected by color flow Doppler.   Cardiac monitor 10/01/19  Sinus rhythm 2 episodes of ventricular tachycardia with longest at 9 beats.  Rare PVCs 3 short runs of supraventricular tachycardia, longest 12 beats  EKG:  EKG is  ordered today.  The ekg ordered today demonstrates sinus tachycardia rate 101 bpm  Recent Labs: 10/19/2023: Magnesium  2.2 01/12/2024: BNP 80.1 03/21/2024: ALT 29; BUN 13; Creatinine, Ser 0.70; Hemoglobin 14.6; Platelets 212; Potassium 3.2; Sodium 138  Recent Lipid Panel    Component Value Date/Time   CHOL 113 02/29/2024 1537   TRIG 192 (H) 02/29/2024 1537   HDL 45 02/29/2024 1537   CHOLHDL 2.5 02/29/2024 1537   CHOLHDL 4.3 10/10/2023 0345   VLDL 69 (H) 10/10/2023 0345   LDLCALC 37 02/29/2024 1537    Risk Assessment/Calculations:   CHA2DS2-VASc Score =     This indicates a  % annual risk of stroke. The patient's  score is based upon:        Home Medications   No outpatient medications have been marked as taking for the 07/04/24 encounter (Appointment) with Lucien Orren SAILOR, PA-C.   Current Facility-Administered Medications for the 07/04/24 encounter (Appointment) with Lucien Orren SAILOR, PA-C  Medication   lactulose  (CHRONULAC ) 10 GM/15ML solution 20 g   nicotine  polacrilex (NICORETTE ) gum 4 mg     Review of Systems      All other systems reviewed and are otherwise negative except as noted above.  Physical Exam    VS:  There were no vitals taken for this visit. , BMI There is no height or weight on file to calculate BMI.  Wt Readings from Last 3 Encounters:  04/10/24 168 lb 12.8 oz (76.6 kg)  03/23/24 168 lb 3.2 oz (76.3 kg)  02/29/24 175 lb 9.6 oz (79.7 kg)  GEN: Well nourished, well developed, in no acute distress. HEENT: normal. Neck: Supple, no JVD, carotid bruits, or masses. Cardiac: RRR, no murmurs, rubs, or gallops. No clubbing, cyanosis, edema.  Radials/PT 2+ and equal bilaterally.  Respiratory:  Respirations regular and unlabored, RLL rhonchi. GI: Soft, nontender, nondistended. MS: No deformity or atrophy. Skin: Warm and dry, no rash. Neuro:  Strength and sensation are intact. Psych: Normal affect.  Assessment & Plan     Medication intolerance and polypharmacy evaluation Daily nausea and dizziness likely related to morning medications. Suspected contribution from Adderall and spironolactone . Blood pressure fluctuations possibly due to medication timing. Incorrect losartan  dosing noted. - Switched Zetia  to nighttime dosing. - Held spironolactone  for one week to assess impact on symptoms. - Held losartan  as blood pressure is well-managed. - Monitor blood pressure and symptoms during medication adjustments. - Provided updated medication list.  Coronary artery disease Managed with aspirin , Lipitor , Vascepa , metoprolol , and Brilinta . No new symptoms reported. Metoprolol   essential for heart rate control. - Continue current medications for coronary artery disease.  Essential hypertension Blood pressure well-managed with metoprolol . Incorrect losartan  dosing noted. Metoprolol  preferred for minimal impact on blood pressure. - Held losartan  as blood pressure is well-managed. - Continue metoprolol  at night.  Hyperlipidemia with hypertriglyceridemia Triglycerides controlled at 144 mg/dL with Vascepa . HDL satisfactory at 63 mg/dL. LDL well-controlled at 13 mg/dL. Vascepa  necessary for triglyceride control. - Continue Vascepa  for triglyceride control. - Switched Zetia  to nighttime dosing.  Peripheral neuropathy Worsening neuropathy symptoms with loss of feeling in legs and cold extremities. Recent fall may have exacerbated symptoms. A1c at 6.1 indicates prediabetes, not severe enough to explain neuropathy. - Will evaluate nerve function in legs. - Will consider further evaluation for neuropathy.    Disposition: Follow up 6 months with Lonni Cash, MD or APP.  Signed, Orren LOISE Fabry, PA-C 07/03/2024, 10:57 AM [facility]  "

## 2024-07-04 ENCOUNTER — Encounter (HOSPITAL_BASED_OUTPATIENT_CLINIC_OR_DEPARTMENT_OTHER): Payer: Self-pay

## 2024-07-04 ENCOUNTER — Ambulatory Visit: Admitting: Podiatry

## 2024-07-04 ENCOUNTER — Encounter: Payer: Self-pay | Admitting: Podiatry

## 2024-07-04 ENCOUNTER — Ambulatory Visit: Attending: Physician Assistant | Admitting: Physician Assistant

## 2024-07-04 ENCOUNTER — Emergency Department (HOSPITAL_BASED_OUTPATIENT_CLINIC_OR_DEPARTMENT_OTHER)

## 2024-07-04 ENCOUNTER — Other Ambulatory Visit: Payer: Self-pay

## 2024-07-04 ENCOUNTER — Encounter: Payer: Self-pay | Admitting: Physician Assistant

## 2024-07-04 ENCOUNTER — Emergency Department (HOSPITAL_BASED_OUTPATIENT_CLINIC_OR_DEPARTMENT_OTHER)
Admission: EM | Admit: 2024-07-04 | Discharge: 2024-07-04 | Disposition: A | Discharge status: Home / Self Care | Attending: Emergency Medicine | Admitting: Emergency Medicine

## 2024-07-04 VITALS — BP 114/76 | HR 95 | Ht 62.0 in | Wt 164.0 lb

## 2024-07-04 DIAGNOSIS — I255 Ischemic cardiomyopathy: Secondary | ICD-10-CM

## 2024-07-04 DIAGNOSIS — Z7982 Long term (current) use of aspirin: Secondary | ICD-10-CM | POA: Insufficient documentation

## 2024-07-04 DIAGNOSIS — I10 Essential (primary) hypertension: Secondary | ICD-10-CM

## 2024-07-04 DIAGNOSIS — I493 Ventricular premature depolarization: Secondary | ICD-10-CM

## 2024-07-04 DIAGNOSIS — I4891 Unspecified atrial fibrillation: Secondary | ICD-10-CM

## 2024-07-04 DIAGNOSIS — G5791 Unspecified mononeuropathy of right lower limb: Secondary | ICD-10-CM | POA: Diagnosis not present

## 2024-07-04 DIAGNOSIS — R002 Palpitations: Secondary | ICD-10-CM

## 2024-07-04 DIAGNOSIS — R079 Chest pain, unspecified: Secondary | ICD-10-CM | POA: Diagnosis not present

## 2024-07-04 DIAGNOSIS — R0602 Shortness of breath: Secondary | ICD-10-CM | POA: Insufficient documentation

## 2024-07-04 DIAGNOSIS — M25551 Pain in right hip: Secondary | ICD-10-CM | POA: Insufficient documentation

## 2024-07-04 DIAGNOSIS — I2510 Atherosclerotic heart disease of native coronary artery without angina pectoris: Secondary | ICD-10-CM

## 2024-07-04 LAB — COMPREHENSIVE METABOLIC PANEL WITH GFR
ALT: 28 U/L (ref 0–44)
AST: 43 U/L — ABNORMAL HIGH (ref 15–41)
Albumin: 4.4 g/dL (ref 3.5–5.0)
Alkaline Phosphatase: 98 U/L (ref 38–126)
Anion gap: 14 (ref 5–15)
BUN: 18 mg/dL (ref 6–20)
CO2: 27 mmol/L (ref 22–32)
Calcium: 9.2 mg/dL (ref 8.9–10.3)
Chloride: 93 mmol/L — ABNORMAL LOW (ref 98–111)
Creatinine, Ser: 0.7 mg/dL (ref 0.44–1.00)
GFR, Estimated: >60 mL/min
Glucose, Bld: 110 mg/dL — ABNORMAL HIGH (ref 70–99)
Potassium: 3.5 mmol/L (ref 3.5–5.1)
Sodium: 133 mmol/L — ABNORMAL LOW (ref 135–145)
Total Bilirubin: 0.5 mg/dL (ref 0.0–1.2)
Total Protein: 6.7 g/dL (ref 6.5–8.1)

## 2024-07-04 LAB — RESP PANEL BY RT-PCR (RSV, FLU A&B, COVID)  RVPGX2
Influenza A by PCR: NEGATIVE
Influenza B by PCR: NEGATIVE
Resp Syncytial Virus by PCR: NEGATIVE
SARS Coronavirus 2 by RT PCR: NEGATIVE

## 2024-07-04 LAB — CBC WITH DIFFERENTIAL/PLATELET
Abs Immature Granulocytes: 0.04 K/uL (ref 0.00–0.07)
Basophils Absolute: 0.1 K/uL (ref 0.0–0.1)
Basophils Relative: 1 %
Eosinophils Absolute: 0.1 K/uL (ref 0.0–0.5)
Eosinophils Relative: 1 %
HCT: 38.4 % (ref 36.0–46.0)
Hemoglobin: 13.5 g/dL (ref 12.0–15.0)
Immature Granulocytes: 1 %
Lymphocytes Relative: 22 %
Lymphs Abs: 1.8 K/uL (ref 0.7–4.0)
MCH: 34.4 pg — ABNORMAL HIGH (ref 26.0–34.0)
MCHC: 35.2 g/dL (ref 30.0–36.0)
MCV: 97.7 fL (ref 80.0–100.0)
Monocytes Absolute: 0.5 K/uL (ref 0.1–1.0)
Monocytes Relative: 6 %
Neutro Abs: 5.8 K/uL (ref 1.7–7.7)
Neutrophils Relative %: 69 %
Platelets: 228 K/uL (ref 150–400)
RBC: 3.93 MIL/uL (ref 3.87–5.11)
RDW: 15.3 % (ref 11.5–15.5)
WBC: 8.3 K/uL (ref 4.0–10.5)
nRBC: 0 % (ref 0.0–0.2)

## 2024-07-04 LAB — D-DIMER, QUANTITATIVE: D-Dimer, Quant: <0.27 ug{FEU}/mL (ref 0.00–0.50)

## 2024-07-04 LAB — LIPASE, BLOOD: Lipase: 74 U/L — ABNORMAL HIGH (ref 11–51)

## 2024-07-04 LAB — TROPONIN T, HIGH SENSITIVITY
Troponin T High Sensitivity: <15 ng/L (ref 0–19)
Troponin T High Sensitivity: <15 ng/L (ref 0–19)

## 2024-07-04 MED ORDER — KETOROLAC TROMETHAMINE 15 MG/ML IJ SOLN
15.0000 mg | Freq: Once | INTRAMUSCULAR | Status: AC
  Administered 2024-07-04: 15 mg via INTRAVENOUS
  Filled 2024-07-04: qty 1

## 2024-07-04 MED ORDER — SODIUM CHLORIDE 0.9 % IV BOLUS
1000.0000 mL | Freq: Once | INTRAVENOUS | Status: AC
  Administered 2024-07-04: 1000 mL via INTRAVENOUS

## 2024-07-04 MED ORDER — ONDANSETRON HCL 4 MG/2ML IJ SOLN
4.0000 mg | Freq: Once | INTRAMUSCULAR | Status: AC
  Administered 2024-07-04: 4 mg via INTRAVENOUS
  Filled 2024-07-04: qty 2

## 2024-07-04 NOTE — ED Provider Notes (Addendum)
 F/u 2nd trop and imaging. If neg, D/C Physical Exam  BP 109/72   Pulse 95   Temp 98 F (36.7 C) (Oral)   Resp 18   SpO2 98%   Physical Exam  Procedures  Procedures  ED Course / MDM    Medical Decision Making Amount and/or Complexity of Data Reviewed Labs: ordered. Radiology: ordered.  Risk Prescription drug management.   Radiology interpretation of pelvis and chest x-ray films no acute findings.  Other chronic findings present but no acute.  Results reviewed with the patient.  Encouraged to follow-up with orthopedics for ongoing hip pain.  Advised to take over-the-counter ibuprofen  or Tylenol  for pain and topical patches if needed.  Patient is requesting another bag of IV fluids.  She has had 1 IV fluid bag.  Labs do not indicate any dehydration and patient is taking a soda beverage at bedside without difficulty.  Encouraged to continue oral hydration at home.  Patient requesting a copy of her x-ray images.  Patient was counseled to get reports on MyChart but is committed to having a copy from radiology.  We are checking into this to see what is possible.       Armenta Canning, MD 07/04/24 1510    Armenta Canning, MD 07/04/24 1655    Armenta Canning, MD 07/04/24 [phone]

## 2024-07-04 NOTE — ED Provider Notes (Signed)
 " [place] [facility] Provider Note   CSN: [id number] Arrival date & time: 07/04/24  1255     Patient presents with: multiple complaints    [name] is a 53 y.o. female.   53 yo F with a chief complaints of feeling fatigued.  She thinks maybe she is dehydrated.  Tells me that she has struggled with intermittent nausea for months but had significant vomiting over the past couple days.  She has been able to drink a bit but does not really feel like eating.  No fevers.  Tells me she just feels rundown.  If she gets up and tries to walk she has to stop and take a break and she gets sweaty.  She denies any obvious chest discomfort with this.  Denies cough or congestion.  She actually saw her cardiologist and podiatrist today.  She had suffered a fall maybe 3 months ago and has been feeling like her hands and feet have been cold off and on since.        Prior to Admission medications  Medication Sig Start Date End Date Taking? Authorizing Provider  albuterol  (VENTOLIN  HFA) 108 (90 Base) MCG/ACT inhaler Inhale 2 puffs into the lungs every 4 (four) hours as needed for wheezing or shortness of breath. 10/15/22   Roddenberry, Myron G, PA-C  albuterol  (VENTOLIN  HFA) 108 (90 Base) MCG/ACT inhaler Inhale 1-2 puffs into the lungs every 6 (six) hours as needed for wheezing or shortness of breath (wheezing, shortness of breath). 04/10/24   Adrien Winfred Berke, MD  ALPRAZolam (XANAX) 0.5 MG tablet 1 tablet Orally once a day for 30 days As needed Patient taking differently: Take 0.5 mg by mouth at bedtime as needed. 10/15/23   [provider]  amphetamine -dextroamphetamine  (ADDERALL XR) 30 MG 24 hr capsule Take 60 mg by mouth daily.    [provider]  Ascorbic Acid  500 MG CAPS as directed Orally once a day; Duration: 90 days    [provider]  aspirin  EC 81 MG tablet Take 1 tablet (81 mg total) by mouth daily. Swallow whole.  01/03/24   Verlin Lonni BIRCH, MD  aspirin -acetaminophen -caffeine (EXCEDRIN MIGRAINE) 250-250-65 MG tablet Take 2 tablets by mouth daily as needed.    [provider]  atorvastatin  (LIPITOR ) 80 MG tablet Take 1 tablet (80 mg total) by mouth daily. 09/06/23   Verlin Lonni BIRCH, MD  cholecalciferol (VITAMIN D3) 25 MCG (1000 UNIT) tablet Take 1,000 Units by mouth daily. 03/07/24   [provider]  cyanocobalamin  (VITAMIN B12) 1000 MCG tablet Take 1,000 mcg by mouth daily.    [provider]  escitalopram  (LEXAPRO ) 10 MG tablet Take 10 mg by mouth daily as needed (anxiety).    [provider]  ezetimibe  (ZETIA ) 10 MG tablet Take 1 tablet (10 mg total) by mouth daily. 01/11/24   Verlin Lonni BIRCH, MD  furosemide  (LASIX ) 20 MG tablet Take 1 tablet (20 mg total) by mouth daily as needed (for lower extremity edema). 10/11/23   Sheron Lorette GRADE, PA-C  gabapentin  (NEURONTIN ) 400 MG capsule Take 800 mg by mouth daily as needed (for pain).    [provider]  icosapent  Ethyl (VASCEPA ) 1 g capsule Take 2 capsules (2 g total) by mouth 2 (two) times daily. 06/22/24   Lucien Orren SAILOR, PA-C  Magnesium  300 MG CAPS daily as needed.    [provider]  metoprolol  succinate (TOPROL -XL) 50 MG 24 hr tablet Take 1/2  tablet ( 25 mg ) daily Patient taking differently: Take 50 mg by mouth daily. Take 1/2 tablet ( 25 mg ) daily 06/07/24   Lucien Orren SAILOR, PA-C  nicotine  (NICODERM CQ  - DOSED IN MG/24 HOURS) 21 mg/24hr patch Place 1 patch (21 mg total) onto the skin daily. 10/11/23   Sheron Lorette GRADE, PA-C  nitroGLYCERIN  (NITROSTAT ) 0.4 MG SL tablet PLACE 1 TABLET UNDER THE TONGUE EVERY 5 MINUTES AS NEEDED FOR CHEST PAIN. 10/05/23   Verlin Lonni BIRCH, MD  omeprazole (PRILOSEC) 20 MG capsule Take 20 mg by mouth daily.    [provider]  spironolactone  (ALDACTONE ) 25 MG tablet Take 1 tablet (25 mg total) by mouth daily. 11/08/23   Lucien Orren SAILOR, PA-C   ticagrelor  (BRILINTA ) 90 MG TABS tablet Take 1 tablet (90 mg total) by mouth 2 (two) times daily. 03/23/24   Verlin Lonni BIRCH, MD  zolpidem  (AMBIEN ) 10 MG tablet Take 10 mg by mouth at bedtime.    [provider]    Allergies: Zoloft [sertraline]    Review of Systems  Updated Vital Signs BP 109/72   Pulse 95   Temp 98 F (36.7 C) (Oral)   Resp 18   SpO2 98%   Physical Exam Vitals and nursing note reviewed.  Constitutional:      General: She is not in acute distress.    Appearance: She is well-developed. She is not diaphoretic.  HENT:     Head: Normocephalic and atraumatic.  Eyes:     Pupils: Pupils are equal, round, and reactive to light.  Cardiovascular:     Rate and Rhythm: Normal rate and regular rhythm.     Heart sounds: No murmur heard.    No friction rub. No gallop.  Pulmonary:     Effort: Pulmonary effort is normal.     Breath sounds: No wheezing or rales.  Abdominal:     General: There is no distension.     Palpations: Abdomen is soft.     Tenderness: There is no abdominal tenderness.  Musculoskeletal:        General: No tenderness.     Cervical back: Normal range of motion and neck supple.     Comments: I am able to internally externally rotate the right lower extremity without obvious discomfort.  Pulse motor and sensation are intact distally.    Skin:    General: Skin is warm and dry.  Neurological:     Mental Status: She is alert and oriented to person, place, and time.  Psychiatric:        Behavior: Behavior normal.     (all labs ordered are listed, but only abnormal results are displayed) Labs Reviewed  CBC WITH DIFFERENTIAL/PLATELET - Abnormal; Notable for the following components:      Result Value   MCH 34.4 (*)    All other components within normal limits  COMPREHENSIVE METABOLIC PANEL WITH GFR - Abnormal; Notable for the following components:   Sodium 133 (*)    Chloride 93 (*)    Glucose, Bld 110 (*)    AST 43 (*)     All other components within normal limits  LIPASE, BLOOD - Abnormal; Notable for the following components:   Lipase 74 (*)    All other components within normal limits  RESP PANEL BY RT-PCR (RSV, FLU A&B, COVID)  RVPGX2  D-DIMER, QUANTITATIVE  TROPONIN T, HIGH SENSITIVITY  TROPONIN T, HIGH SENSITIVITY    EKG: None  Radiology: No results found.  Procedures   Medications Ordered in the ED  ketorolac  (TORADOL ) 15 MG/ML injection 15 mg (has no administration in time range)  sodium chloride  0.9 % bolus 1,000 mL (1,000 mLs Intravenous New Bag/Given 07/04/24 1348)  ondansetron  (ZOFRAN ) injection 4 mg (4 mg Intravenous Given 07/04/24 1341)                                    Medical Decision Making Amount and/or Complexity of Data Reviewed Labs: ordered. Radiology: ordered.  Risk Prescription drug management.   27 yoF with a chief complaints of generalized fatigue.  Going on for a couple days now.  She thinks is due to worsening chronic nausea and vomiting.  She does describe diaphoresis with very minimal exertion.  On record review patient with recent stent placement in March of this year.  Will obtain 2 troponins.  Chest x-ray.  Bolus of IV fluids.  She is tachycardic here up to the 120s will obtain a D-dimer.  Ddimer negative.  Initial trop negative.   CXR without focal infiltrate or ptx.    No acute anemia.   Awaiting second trop.    Patient care signed out to Dr. Armenta, please see their note for further details of care in the ED.  The patients results and plan were reviewed and discussed.   Any x-rays performed were independently reviewed by myself.   Differential diagnosis were considered with the presenting HPI.  Medications  ketorolac  (TORADOL ) 15 MG/ML injection 15 mg (has no administration in time range)  sodium chloride  0.9 % bolus 1,000 mL (1,000 mLs Intravenous New Bag/Given 07/04/24 1348)  ondansetron  (ZOFRAN ) injection 4 mg (4 mg Intravenous  Given 07/04/24 1341)    Vitals:   07/04/24 1300 07/04/24 1303 07/04/24 1400 07/04/24 1415  BP: (!) 123/91  107/73 109/72  Pulse: (!) 118 (!) 104 98 95  Resp: 18 17 16 18   Temp: 98 F (36.7 C)     TempSrc: Oral     SpO2: 99% 99% 100% 98%    Final diagnoses:  Shortness of breath on exertion  Right hip pain         Final diagnoses:  Shortness of breath on exertion  Right hip pain    ED Discharge Orders     None          Emil Share, DO 07/04/24 1501  "

## 2024-07-04 NOTE — Discharge Instructions (Signed)
 See your doctor for recheck as soon as possible.  Continue all your regularly prescribed medications.

## 2024-07-04 NOTE — ED Triage Notes (Signed)
 Reports BIL feet and hands being cold for multiple months. States worsened over the last couple days.  Also states she thinks she is dehydrated and malnourished due to fatigue for 1 week.   Also reports recurrent N/V/D for multiple months  Also reports upper back since yesterday. States pain is chronic from fall multiple months ago

## 2024-07-04 NOTE — ED Notes (Signed)
 Pt. Reports she has something going on in her Throat.  She gets nauseated and feels like she has something in her throat.  This has been going on for 2 years since her Bypass surgery.

## 2024-07-04 NOTE — Patient Instructions (Signed)
 Medication Instructions:  STOP taking Losartan  (Cozaar ).   HOLD your Spironolactone  (Aldactone ) for one (1) week.   CHANGE taking your Ezetimibe  (Zetia ) to once at night.  *If you need a refill on your cardiac medications before your next appointment, please call your pharmacy*  Lab Work: None Ordered If you have labs (blood work) drawn today and your tests are completely normal, you will receive your results only by: MyChart Message (if you have MyChart) OR A paper copy in the mail If you have any lab test that is abnormal or we need to change your treatment, we will call you to review the results.  Testing/Procedures: None ordered  Follow-Up: At [facility], you and your health needs are our priority.  As part of our continuing mission to provide you with exceptional heart care, our providers are all part of one team.  This team includes your primary Cardiologist (physician) and Advanced Practice Providers or APPs (Physician Assistants and Nurse Practitioners) who all work together to provide you with the care you need, when you need it.  Your next appointment:   4 month(s)  Provider:   Lonni Cash, MD    We recommend signing up for the patient portal called MyChart.  Sign up information is provided on this After Visit Summary.  MyChart is used to connect with patients for Virtual Visits (Telemedicine).  Patients are able to view lab/test results, encounter notes, upcoming appointments, etc.  Non-urgent messages can be sent to your provider as well.   To learn more about what you can do with MyChart, go to [email or web address].   Other Instructions Your physician has requested that you regularly monitor and record your blood pressure readings at home. Please use the same machine at the same time of day to check your readings and record them to bring to your follow-up visit.   Please monitor blood pressures and keep a log of your readings.    Make sure to  check 2 hours after your medications.    AVOID these things for 30 minutes before checking your blood pressure: No Drinking caffeine. No Drinking alcohol. No Eating. No Smoking. No Exercising.   Five minutes before checking your blood pressure: Pee. Sit in a dining chair. Avoid sitting in a soft couch or armchair. Be quiet. Do not talk  Send in readings through MyChart in a week.

## 2024-07-04 NOTE — Progress Notes (Signed)
 Patient in follow-up injection calcaneal branch lateral plantar nerve right.  She had about 2 or 3 days improvement with the injection on the foot but pain now is right back to where it was.  Still getting cold sensations in the feet.  Has some numb sensations.  Again particularly mapped out an area in the L5 nerve distribution feet bilaterally   Physical exam:  General appearance: Pleasant, and in no acute distress. AOx3.  Vascular: Pedal pulses: DP 2/4 bilaterally, PT 2/4 bilaterally.  Mild edema lower legs bilaterally. Capillary fill time immediate bilaterally.  Neurological: Light touch intact vibratory sensation intact bilaterally  Dermatologic:   Skin normal temperature bilaterally.  Skin normal color, tone, and texture bilaterally.   Musculoskeletal: Some tenderness over the porta pedis and the plantar medial aspect of the heel bilaterally.   Diagnosis: 1.  Neuritis foot bilaterally.  Plan: -Established patient office visit for evaluation level 3. - Did get some improvement with the injection at the calcaneal branch of the lateral plantar nerve at the distal tarsal tunnel right.  Given her distribution of symptoms however it sounds like it is more either peripheral neuropathy or radiculopathy.  There may be a slight component of plantar fasciitis with this but that does not really account for dorsal symptoms.  I think it be best for her to see a neurologist.  She already sees Dr. Joshua.  She will make an appointment with him and discussed the matter.  If she has any further problems she can call us    Return as needed

## 2024-07-16 ENCOUNTER — Emergency Department (HOSPITAL_BASED_OUTPATIENT_CLINIC_OR_DEPARTMENT_OTHER)

## 2024-07-16 ENCOUNTER — Inpatient Hospital Stay (HOSPITAL_BASED_OUTPATIENT_CLINIC_OR_DEPARTMENT_OTHER)
Admission: EM | Admit: 2024-07-16 | Discharge: 2024-07-20 | DRG: 866 | Disposition: A | Discharge status: Home / Self Care | Attending: Internal Medicine | Admitting: Internal Medicine

## 2024-07-16 ENCOUNTER — Other Ambulatory Visit: Payer: Self-pay

## 2024-07-16 ENCOUNTER — Encounter (HOSPITAL_BASED_OUTPATIENT_CLINIC_OR_DEPARTMENT_OTHER): Payer: Self-pay

## 2024-07-16 DIAGNOSIS — Z720 Tobacco use: Secondary | ICD-10-CM | POA: Diagnosis present

## 2024-07-16 DIAGNOSIS — I110 Hypertensive heart disease with heart failure: Secondary | ICD-10-CM | POA: Diagnosis present

## 2024-07-16 DIAGNOSIS — F419 Anxiety disorder, unspecified: Secondary | ICD-10-CM | POA: Diagnosis present

## 2024-07-16 DIAGNOSIS — E785 Hyperlipidemia, unspecified: Secondary | ICD-10-CM | POA: Diagnosis present

## 2024-07-16 DIAGNOSIS — I252 Old myocardial infarction: Secondary | ICD-10-CM

## 2024-07-16 DIAGNOSIS — E876 Hypokalemia: Secondary | ICD-10-CM | POA: Diagnosis present

## 2024-07-16 DIAGNOSIS — F32A Depression, unspecified: Secondary | ICD-10-CM | POA: Diagnosis present

## 2024-07-16 DIAGNOSIS — E861 Hypovolemia: Secondary | ICD-10-CM | POA: Diagnosis present

## 2024-07-16 DIAGNOSIS — Z79899 Other long term (current) drug therapy: Secondary | ICD-10-CM

## 2024-07-16 DIAGNOSIS — E7800 Pure hypercholesterolemia, unspecified: Secondary | ICD-10-CM | POA: Diagnosis present

## 2024-07-16 DIAGNOSIS — I429 Cardiomyopathy, unspecified: Secondary | ICD-10-CM | POA: Diagnosis present

## 2024-07-16 DIAGNOSIS — Z7902 Long term (current) use of antithrombotics/antiplatelets: Secondary | ICD-10-CM

## 2024-07-16 DIAGNOSIS — J45909 Unspecified asthma, uncomplicated: Secondary | ICD-10-CM | POA: Diagnosis present

## 2024-07-16 DIAGNOSIS — F900 Attention-deficit hyperactivity disorder, predominantly inattentive type: Secondary | ICD-10-CM | POA: Diagnosis present

## 2024-07-16 DIAGNOSIS — E669 Obesity, unspecified: Secondary | ICD-10-CM | POA: Diagnosis present

## 2024-07-16 DIAGNOSIS — I255 Ischemic cardiomyopathy: Secondary | ICD-10-CM

## 2024-07-16 DIAGNOSIS — Z888 Allergy status to other drugs, medicaments and biological substances status: Secondary | ICD-10-CM

## 2024-07-16 DIAGNOSIS — Z951 Presence of aortocoronary bypass graft: Secondary | ICD-10-CM

## 2024-07-16 DIAGNOSIS — Z1152 Encounter for screening for COVID-19: Secondary | ICD-10-CM

## 2024-07-16 DIAGNOSIS — J102 Influenza due to other identified influenza virus with gastrointestinal manifestations: Principal | ICD-10-CM | POA: Diagnosis present

## 2024-07-16 DIAGNOSIS — Z8249 Family history of ischemic heart disease and other diseases of the circulatory system: Secondary | ICD-10-CM

## 2024-07-16 DIAGNOSIS — I471 Supraventricular tachycardia, unspecified: Secondary | ICD-10-CM

## 2024-07-16 DIAGNOSIS — I25110 Atherosclerotic heart disease of native coronary artery with unstable angina pectoris: Secondary | ICD-10-CM | POA: Diagnosis present

## 2024-07-16 DIAGNOSIS — R002 Palpitations: Secondary | ICD-10-CM

## 2024-07-16 DIAGNOSIS — F5101 Primary insomnia: Secondary | ICD-10-CM | POA: Diagnosis present

## 2024-07-16 DIAGNOSIS — R079 Chest pain, unspecified: Secondary | ICD-10-CM | POA: Diagnosis present

## 2024-07-16 DIAGNOSIS — I493 Ventricular premature depolarization: Secondary | ICD-10-CM

## 2024-07-16 DIAGNOSIS — K219 Gastro-esophageal reflux disease without esophagitis: Secondary | ICD-10-CM | POA: Diagnosis present

## 2024-07-16 DIAGNOSIS — Z955 Presence of coronary angioplasty implant and graft: Secondary | ICD-10-CM

## 2024-07-16 DIAGNOSIS — I5020 Unspecified systolic (congestive) heart failure: Secondary | ICD-10-CM | POA: Diagnosis present

## 2024-07-16 DIAGNOSIS — Z59819 Housing instability, housed unspecified: Secondary | ICD-10-CM

## 2024-07-16 DIAGNOSIS — Z7982 Long term (current) use of aspirin: Secondary | ICD-10-CM

## 2024-07-16 DIAGNOSIS — Z981 Arthrodesis status: Secondary | ICD-10-CM

## 2024-07-16 DIAGNOSIS — I2510 Atherosclerotic heart disease of native coronary artery without angina pectoris: Secondary | ICD-10-CM

## 2024-07-16 DIAGNOSIS — R1013 Epigastric pain: Secondary | ICD-10-CM

## 2024-07-16 DIAGNOSIS — I200 Unstable angina: Secondary | ICD-10-CM

## 2024-07-16 DIAGNOSIS — I5022 Chronic systolic (congestive) heart failure: Secondary | ICD-10-CM | POA: Diagnosis present

## 2024-07-16 DIAGNOSIS — I10 Essential (primary) hypertension: Secondary | ICD-10-CM

## 2024-07-16 DIAGNOSIS — J111 Influenza due to unidentified influenza virus with other respiratory manifestations: Secondary | ICD-10-CM

## 2024-07-16 DIAGNOSIS — Z6832 Body mass index (BMI) 32.0-32.9, adult: Secondary | ICD-10-CM

## 2024-07-16 DIAGNOSIS — F17210 Nicotine dependence, cigarettes, uncomplicated: Secondary | ICD-10-CM | POA: Diagnosis present

## 2024-07-16 DIAGNOSIS — M069 Rheumatoid arthritis, unspecified: Secondary | ICD-10-CM | POA: Diagnosis present

## 2024-07-16 DIAGNOSIS — I4891 Unspecified atrial fibrillation: Secondary | ICD-10-CM | POA: Diagnosis present

## 2024-07-16 DIAGNOSIS — M797 Fibromyalgia: Secondary | ICD-10-CM | POA: Diagnosis present

## 2024-07-16 DIAGNOSIS — J101 Influenza due to other identified influenza virus with other respiratory manifestations: Principal | ICD-10-CM | POA: Diagnosis present

## 2024-07-16 LAB — LACTIC ACID, PLASMA: Lactic Acid, Venous: 1.1 mmol/L (ref 0.5–1.9)

## 2024-07-16 LAB — TROPONIN T, HIGH SENSITIVITY
Troponin T High Sensitivity: <15 ng/L (ref 0–19)
Troponin T High Sensitivity: <15 ng/L (ref 0–19)
Troponin T High Sensitivity: <15 ng/L (ref 0–19)

## 2024-07-16 LAB — CBC
HCT: 39.3 % (ref 36.0–46.0)
Hemoglobin: 13.8 g/dL (ref 12.0–15.0)
MCH: 34.2 pg — ABNORMAL HIGH (ref 26.0–34.0)
MCHC: 35.1 g/dL (ref 30.0–36.0)
MCV: 97.3 fL (ref 80.0–100.0)
Platelets: 288 K/uL (ref 150–400)
RBC: 4.04 MIL/uL (ref 3.87–5.11)
RDW: 16.1 % — ABNORMAL HIGH (ref 11.5–15.5)
WBC: 4.4 K/uL (ref 4.0–10.5)
nRBC: 0 % (ref 0.0–0.2)

## 2024-07-16 LAB — RESP PANEL BY RT-PCR (RSV, FLU A&B, COVID)  RVPGX2
Influenza A by PCR: POSITIVE — AB
Influenza B by PCR: NEGATIVE
Resp Syncytial Virus by PCR: NEGATIVE
SARS Coronavirus 2 by RT PCR: NEGATIVE

## 2024-07-16 LAB — COMPREHENSIVE METABOLIC PANEL WITH GFR
ALT: 25 U/L (ref 0–44)
AST: 41 U/L (ref 15–41)
Albumin: 3.9 g/dL (ref 3.5–5.0)
Alkaline Phosphatase: 142 U/L — ABNORMAL HIGH (ref 38–126)
Anion gap: 14 (ref 5–15)
BUN: 6 mg/dL (ref 6–20)
CO2: 27 mmol/L (ref 22–32)
Calcium: 9 mg/dL (ref 8.9–10.3)
Chloride: 95 mmol/L — ABNORMAL LOW (ref 98–111)
Creatinine, Ser: 0.47 mg/dL (ref 0.44–1.00)
GFR, Estimated: >60 mL/min
Glucose, Bld: 125 mg/dL — ABNORMAL HIGH (ref 70–99)
Potassium: 3.2 mmol/L — ABNORMAL LOW (ref 3.5–5.1)
Sodium: 136 mmol/L (ref 135–145)
Total Bilirubin: 0.7 mg/dL (ref 0.0–1.2)
Total Protein: 6.8 g/dL (ref 6.5–8.1)

## 2024-07-16 LAB — URINALYSIS, ROUTINE W REFLEX MICROSCOPIC
Bilirubin Urine: NEGATIVE
Glucose, UA: NEGATIVE mg/dL
Hgb urine dipstick: NEGATIVE
Ketones, ur: NEGATIVE mg/dL
Leukocytes,Ua: NEGATIVE
Nitrite: NEGATIVE
Protein, ur: NEGATIVE mg/dL
Specific Gravity, Urine: <1.005 — ABNORMAL LOW (ref 1.005–1.030)
pH: 7.5 (ref 5.0–8.0)

## 2024-07-16 LAB — D-DIMER, QUANTITATIVE: D-Dimer, Quant: 0.87 ug{FEU}/mL — ABNORMAL HIGH (ref 0.00–0.50)

## 2024-07-16 LAB — LIPASE, BLOOD: Lipase: 46 U/L (ref 11–51)

## 2024-07-16 MED ORDER — HYDROMORPHONE HCL 1 MG/ML IJ SOLN
0.5000 mg | INTRAMUSCULAR | Status: DC | PRN
  Administered 2024-07-17: 0.5 mg via INTRAVENOUS
  Filled 2024-07-16: qty 1

## 2024-07-16 MED ORDER — MORPHINE SULFATE (PF) 4 MG/ML IV SOLN
4.0000 mg | Freq: Once | INTRAVENOUS | Status: AC
  Administered 2024-07-16: 4 mg via INTRAVENOUS
  Filled 2024-07-16: qty 1

## 2024-07-16 MED ORDER — IOHEXOL 350 MG/ML SOLN
80.0000 mL | Freq: Once | INTRAVENOUS | Status: AC | PRN
  Administered 2024-07-16: 80 mL via INTRAVENOUS

## 2024-07-16 MED ORDER — LORAZEPAM 2 MG/ML IJ SOLN
1.0000 mg | Freq: Once | INTRAMUSCULAR | Status: AC
  Administered 2024-07-16: 1 mg via INTRAVENOUS
  Filled 2024-07-16: qty 1

## 2024-07-16 MED ORDER — HYDROXYZINE HCL 25 MG PO TABS
25.0000 mg | ORAL_TABLET | Freq: Once | ORAL | Status: AC
  Administered 2024-07-16: 25 mg via ORAL
  Filled 2024-07-16: qty 1

## 2024-07-16 MED ORDER — SODIUM CHLORIDE 0.9 % IV BOLUS
1000.0000 mL | Freq: Once | INTRAVENOUS | Status: AC
  Administered 2024-07-16: 1000 mL via INTRAVENOUS

## 2024-07-16 MED ORDER — FENTANYL CITRATE (PF) 50 MCG/ML IJ SOSY
50.0000 ug | PREFILLED_SYRINGE | Freq: Once | INTRAMUSCULAR | Status: AC
  Administered 2024-07-16: 50 ug via INTRAVENOUS
  Filled 2024-07-16: qty 1

## 2024-07-16 MED ORDER — ONDANSETRON HCL 4 MG/2ML IJ SOLN
4.0000 mg | Freq: Once | INTRAMUSCULAR | Status: AC
  Administered 2024-07-16: 4 mg via INTRAVENOUS
  Filled 2024-07-16: qty 2

## 2024-07-16 MED ORDER — IOHEXOL 350 MG/ML SOLN
60.0000 mL | Freq: Once | INTRAVENOUS | Status: AC | PRN
  Administered 2024-07-16: 60 mL via INTRAVENOUS

## 2024-07-16 MED ORDER — HYDROMORPHONE HCL 1 MG/ML IJ SOLN
1.0000 mg | Freq: Once | INTRAMUSCULAR | Status: AC
  Administered 2024-07-16: 1 mg via INTRAVENOUS
  Filled 2024-07-16: qty 1

## 2024-07-16 MED ORDER — ACETAMINOPHEN 500 MG PO TABS
1000.0000 mg | ORAL_TABLET | Freq: Four times a day (QID) | ORAL | Status: DC | PRN
  Administered 2024-07-17 (×2): 1000 mg via ORAL
  Filled 2024-07-16 (×2): qty 2

## 2024-07-16 MED ORDER — ONDANSETRON 4 MG PO TBDP: 4.0000 mg | ORAL_TABLET | Freq: Three times a day (TID) | ORAL | Status: DC | PRN

## 2024-07-16 NOTE — ED Notes (Signed)
 Patient transported to CT

## 2024-07-16 NOTE — ED Provider Notes (Signed)
 " [facility] AT [facility] Provider Note   CSN: [id number] Arrival date & time: 07/16/24  1344     Patient presents with: Fatigue, Diarrhea, and Chills   [name] is a 54 y.o. female.   54 year old female presenting with fatigue/weakness/abdominal pain/diarrhea.  Patient is accompanied by her daughter who provides collateral information, patient's daughter reports that she was sick with a flulike illness the week of Christmas and that her mother became sick with similar symptoms around the same time, since then she has become more weak/fatigued with intermittent bouts of abdominal pain/diarrhea/nausea/vomiting as well as chills, no known fever at home.  Patient reports feeling dehydrated, states that she has had poor p.o. intake due to feeling generally unwell.  Patient describes intermittent bouts of sharp and sore abdominal pain with diarrhea, describes small amount of diarrhea due to poor p.o. intake but describes it as mucousy, denies melena/hematochezia.  Reports some chest discomfort like when you have a cold in your chest, history of CABG and STEMI earlier this year.  Endorses cough and throat feeling dry.   Diarrhea      Prior to Admission medications  Medication Sig Start Date End Date Taking? Authorizing Provider  albuterol  (VENTOLIN  HFA) 108 (90 Base) MCG/ACT inhaler Inhale 2 puffs into the lungs every 4 (four) hours as needed for wheezing or shortness of breath. 10/15/22   Roddenberry, Myron G, PA-C  albuterol  (VENTOLIN  HFA) 108 (90 Base) MCG/ACT inhaler Inhale 1-2 puffs into the lungs every 6 (six) hours as needed for wheezing or shortness of breath (wheezing, shortness of breath). 04/10/24   Adrien Winfred Berke, MD  ALPRAZolam  (XANAX ) 0.5 MG tablet 1 tablet Orally once a day for 30 days As needed Patient taking differently: Take 0.5 mg by mouth at bedtime as needed. 10/15/23   [provider]   amphetamine -dextroamphetamine  (ADDERALL XR) 30 MG 24 hr capsule Take 60 mg by mouth daily.    [provider]  Ascorbic Acid  500 MG CAPS as directed Orally once a day; Duration: 90 days    [provider]  aspirin  EC 81 MG tablet Take 1 tablet (81 mg total) by mouth daily. Swallow whole. 01/03/24   Verlin Lonni BIRCH, MD  aspirin -acetaminophen -caffeine  (EXCEDRIN MIGRAINE) 250-250-65 MG tablet Take 2 tablets by mouth daily as needed.    [provider]  atorvastatin  (LIPITOR ) 80 MG tablet Take 1 tablet (80 mg total) by mouth daily. 09/06/23   Verlin Lonni BIRCH, MD  cholecalciferol (VITAMIN D3) 25 MCG (1000 UNIT) tablet Take 1,000 Units by mouth daily. 03/07/24   [provider]  cyanocobalamin  (VITAMIN B12) 1000 MCG tablet Take 1,000 mcg by mouth daily.    [provider]  escitalopram  (LEXAPRO ) 10 MG tablet Take 10 mg by mouth daily as needed (anxiety).    [provider]  ezetimibe  (ZETIA ) 10 MG tablet Take 1 tablet (10 mg total) by mouth daily. 01/11/24   Verlin Lonni BIRCH, MD  furosemide  (LASIX ) 20 MG tablet Take 1 tablet (20 mg total) by mouth daily as needed (for lower extremity edema). 10/11/23   Sheron Lorette GRADE, PA-C  gabapentin  (NEURONTIN ) 400 MG capsule Take 800 mg by mouth daily as needed (for pain).    [provider]  icosapent  Ethyl (VASCEPA ) 1 g capsule Take 2 capsules (2 g total) by mouth 2 (two) times daily. 06/22/24   Lucien Orren SAILOR, PA-C  Magnesium  300 MG CAPS daily as needed.    [provider]  metoprolol  succinate (TOPROL -XL) 50 MG 24 hr tablet Take 1/2 tablet ( 25 mg ) daily Patient taking differently: Take 50 mg by mouth daily. Take 1/2 tablet ( 25 mg ) daily 06/07/24   Lucien Orren SAILOR, PA-C  nicotine  (NICODERM CQ  - DOSED IN MG/24 HOURS) 21 mg/24hr patch Place 1 patch (21 mg total) onto the skin daily. 10/11/23   Sheron Lorette GRADE, PA-C  nitroGLYCERIN  (NITROSTAT ) 0.4 MG SL tablet PLACE 1  TABLET UNDER THE TONGUE EVERY 5 MINUTES AS NEEDED FOR CHEST PAIN. 10/05/23   Verlin Lonni BIRCH, MD  omeprazole (PRILOSEC) 20 MG capsule Take 20 mg by mouth daily.    [provider]  spironolactone  (ALDACTONE ) 25 MG tablet Take 1 tablet (25 mg total) by mouth daily. 11/08/23   Lucien Orren SAILOR, PA-C  ticagrelor  (BRILINTA ) 90 MG TABS tablet Take 1 tablet (90 mg total) by mouth 2 (two) times daily. 03/23/24   Verlin Lonni BIRCH, MD  zolpidem  (AMBIEN ) 10 MG tablet Take 10 mg by mouth at bedtime.    [provider]    Allergies: Zoloft [sertraline]    Review of Systems  Gastrointestinal:  Positive for diarrhea.    Updated Vital Signs BP (!) 137/97 (BP Location: Left Arm)   Pulse (!) 115   Temp 98.1 F (36.7 C) (Oral)   Resp (!) 23   Ht 5' 2 (1.575 m)   Wt 74.8 kg   SpO2 98%   BMI 30.18 kg/m   Physical Exam Vitals and nursing note reviewed.  Constitutional:      Appearance: She is ill-appearing.  HENT:     Head: Normocephalic.     Mouth/Throat:     Mouth: Mucous membranes are dry.  Eyes:     Extraocular Movements: Extraocular movements intact.  Cardiovascular:     Rate and Rhythm: Regular rhythm. Tachycardia present.     Pulses:          Radial pulses are 2+ on the right side and 2+ on the left side.       Dorsalis pedis pulses are 2+ on the right side and 2+ on the left side.  Pulmonary:     Effort: Pulmonary effort is normal.     Breath sounds: Rhonchi (bilateral lung bases) present.  Abdominal:     General: Bowel sounds are normal.     Palpations: Abdomen is soft.     Tenderness: There is abdominal tenderness (generalized, does not localize). There is no guarding.  Musculoskeletal:     Cervical back: Normal range of motion.     Right lower leg: No edema.     Left lower leg: No edema.     Comments: Moves all extremities spontaneously without difficulty  Skin:    General: Skin is warm and dry.  Neurological:     General: No focal deficit  present.     Mental Status: She is alert and oriented to person, place, and time.     Sensory: No sensory deficit.     Motor: Weakness (generalized) present.     (all labs ordered are listed, but only abnormal results are displayed) Labs Reviewed  CBC - Abnormal; Notable for the following components:      Result Value   MCH 34.2 (*)    RDW 16.1 (*)    All other components within normal limits  RESP PANEL BY RT-PCR (RSV, FLU A&B, COVID)  RVPGX2  LIPASE, BLOOD  COMPREHENSIVE METABOLIC PANEL WITH GFR  URINALYSIS, ROUTINE W REFLEX  MICROSCOPIC  LACTIC ACID, PLASMA  LACTIC ACID, PLASMA  TROPONIN Janet, HIGH SENSITIVITY    EKG: None  Radiology: No results found.   Procedures   Medications Ordered in the ED  morphine  (PF) 4 MG/ML injection 4 mg (has no administration in time range)  ondansetron  (ZOFRAN ) injection 4 mg (has no administration in time range)  sodium chloride  0.9 % bolus 1,000 mL (has no administration in time range)                                    Medical Decision Making This patient presents to the ED for concern of multiple complaints, this involves an extensive number of treatment options, and is a complaint that carries with it a high risk of complications and morbidity.  The differential diagnosis includes COVID/flu/RSV, other viral respiratory illness, pneumonia, diverticulitis, pancreatitis, appendicitis   Co morbidities that complicate the patient evaluation  History of STEMI, CABG x 4, cardiomyopathy   Additional history obtained:  Additional history obtained from record review External records from outside source obtained and reviewed including recent emergency department note   Lab Tests:  I Ordered, and personally interpreted labs.  The pertinent results include: CBC unremarkable, no leukocytosis.  CMP notable for hypokalemia with potassium of 3.2, alkaline phosphatase elevated at 142 which is a increase from most recent baseline of 98.   Lipase within normal limits.  Respiratory panel positive for influenza A.  Lactic normal, 1.1.  D-dimer elevated at 0.87. Initial troponin < 15, repeat remains unchanged, third troponin pending at time of admission.    Imaging Studies ordered:  I ordered imaging studies including CXR, CT abdomen/pelvis I independently visualized and interpreted imaging which showed - CXR:  No acute cardiopulmonary abnormality. - CT abdomen/pelvis: 1. Stable bilateral adrenal adenomas. 2. Stable mild diffuse wall and fold thickening of the colon which may represent chronic sequela of prior inflammation, although acute inflammation cannot be excluded. However, there does not appear to be a significant amount of pericolonic inflammation to suggest acute inflammation. Potentially this may represent inflammatory bowel disease such as ulcerative colitis. 3. Aortic atherosclerosis. - CTA chest: 1. No evidence of pulmonary embolism. 2. Regional ground-glass opacities in the lungs bilaterally, may be associated with air trapping, edema, or pneumonitis. 3. Stable bilateral adrenal adenomas. 4. Coronary artery calcifications and aortic atherosclerosis.  I agree with the radiologist interpretation   Cardiac Monitoring: / EKG:  The patient was maintained on a cardiac monitor.  I personally viewed and interpreted the cardiac monitored which showed an underlying rhythm of: Sinus tachycardia   Consultations Obtained:  I requested consultation with the cardiologist and hospitalist,  and discussed lab and imaging findings as well as pertinent plan - they recommend: I spoke with Dr. Marty with cardiology, low suspicion for ACS given normal troponin x 2 but requests third troponin collection given patient had flare of epigastric/chest pain ~3hrs ago, plans to consult on patient's care during hospitalization. I spoke with Dr. Dena who agrees that this patient is appropriate for admission.   Problem List / ED Course / Critical  interventions / Medication management  I ordered medication including morphine  for pain, Zofran  for nausea, IV fluids for suspected dehydration/tachycardia, fentanyl  for pain, Ativan  for anxiety, Dilaudid  for pain, hydroxyzine  for anxiety Reevaluation of the patient after these medicines showed that the patient improved I have reviewed the patients home medicines and have made adjustments as needed  Social Determinants of Health:  Tobacco use, housing instability   Test / Admission - Considered:  Physical exam is notable as above, patient is ill-appearing and generally weak, dry mucous membranes and tachycardia suggestive of dehydration.  Breath sounds are notable for rhonchi in the bases bilaterally.  Generalized abdominal pain does not localize, however given ongoing intermittent abdominal pain with mucousy bouts of diarrhea almost daily for over 1 week, I do feel that further imaging is warranted. Reports some chest discomfort, history of STEMI/CABG earlier this year.  Will proceed with ACS workup. Around 3:30p, patient called out in pain, describing chest tightness/epigastric pain stating it feels like I am having a heart attack, EKG was repeated and notable for sinus tachycardia without acute ischemic changes, when patient points to location of her pain she is gesturing to the epigastric region. Fentanyl  given, pain improved however patient still endorsing anxiety/panic attack symptoms, will administer Ativan . Patient persistently tachycardic despite administration of IV fluid boluses, patient had normal D-dimer 12 days ago but I do feel that it is necessary to repeat this again today given concerns as noted above.  D-dimer found to be elevated at 0.87, will proceed with CTA. CTA without evidence of PE.  CT of abdomen/pelvis is notable for diffuse colonic wall thickening which may represent chronic inflammation, does not exclude acute inflammatory process but may be representative of IBD.   Patient reports history of gastric issues. Initial troponin was collected at 1402, however after 2 hours troponin was not yet processed, I contacted lab who told me that they did not receive the specimen yet they did have it available at time of my call, this further delayed the patient's care given that troponin has taken nearly 3 hours to process/result. Given patient's persistent tachycardia with concern for epigastric/chest pain that has been ongoing since time of her presentation today, as well as newly diagnosed influenza A, I do feel that this patient would benefit from hospitalization.  Concern for cardiac component to her ongoing complaint today, I spoke with the cardiologist as above who plans to consult on her care during hospitalization.  I spoke with the hospitalist service who is in agreement that she is appropriate for admission.  Patient is tolerating p.o. without subsequent nausea/vomiting.     Amount and/or Complexity of Data Reviewed Labs: ordered. Radiology: ordered.  Risk Prescription drug management. Decision regarding hospitalization.        Final diagnoses:  Influenza A  Epigastric abdominal pain  Unstable angina [facility])    ED Discharge Orders     None          Janet Rocky SAILOR, PA-C 07/16/24 1912    Ruthe Cornet, DO 07/16/24 2224  "

## 2024-07-16 NOTE — ED Notes (Signed)
 Placed on O2 at 2 l via [place] for sats dropping into mid 80s  States wears O2 at home PRN

## 2024-07-16 NOTE — ED Notes (Signed)
 Pulse ox reading dropped to 88% RA with good pleth. Pt was placed back on 2 lpm .

## 2024-07-16 NOTE — ED Notes (Signed)
 Patient called  Develop epigastric pain grabbing chest; hyperventilating and crying.  Repeat EKG done.  [employer] notified and in to assess.  Orders received

## 2024-07-16 NOTE — ED Triage Notes (Signed)
 Arrives POV with complaints of worsening fatigue, chills, with diarrhea and abdominal pain x1 week.

## 2024-07-16 NOTE — Progress Notes (Signed)
 [facility] cardiology brief note  18F with CAD s/p CABG (2024), ICM, anxiety, fibromyalgia, GERD, arthritis, HLD, tobacco who p/w epigastric pain iso of influenza infection. Contacted by [facility] given coronary history and persistent pain. Her troponin is reassuring (<15 x2) and primary complaint is epigastric rather than chest pain. EKG is non-ischemic. CT is negative for PE, showing significant amount of pericolonic inflammation to suggest acute colitis (?IBD flare). Of note she has had episode of abdominal pain in the past (admitted to [facility] 09/2023 and reporting 1 year of episodic abdominal pain at that time). Given above low concern for ACS. She will be admitted to hospitalist service, cardiology will see her once she arrives at [facility]. Can check another troponin given unclear onset of pain (reported flare of pain around 3:30 pm, most recent troponin from 5 pm) to ensure stability.

## 2024-07-16 NOTE — Progress Notes (Signed)
 Patient presented to [facility] with chest discomfort. Work was unrevealing at outside emergency department. Due to suspicion for possible ACS and refractory pain cardiology was contacted, and patient was accepted to [facility] for transfer.

## 2024-07-17 ENCOUNTER — Encounter (HOSPITAL_BASED_OUTPATIENT_CLINIC_OR_DEPARTMENT_OTHER): Payer: Self-pay | Admitting: Internal Medicine

## 2024-07-17 DIAGNOSIS — J101 Influenza due to other identified influenza virus with other respiratory manifestations: Secondary | ICD-10-CM | POA: Diagnosis not present

## 2024-07-17 DIAGNOSIS — R079 Chest pain, unspecified: Secondary | ICD-10-CM

## 2024-07-17 DIAGNOSIS — J111 Influenza due to unidentified influenza virus with other respiratory manifestations: Secondary | ICD-10-CM

## 2024-07-17 LAB — LACTIC ACID, PLASMA: Lactic Acid, Venous: 1.7 mmol/L (ref 0.5–1.9)

## 2024-07-17 LAB — PRO BRAIN NATRIURETIC PEPTIDE: Pro Brain Natriuretic Peptide: 1243 pg/mL — ABNORMAL HIGH

## 2024-07-17 MED ORDER — ACETAMINOPHEN 650 MG RE SUPP: 650.0000 mg | Freq: Four times a day (QID) | RECTAL | Status: DC | PRN

## 2024-07-17 MED ORDER — EZETIMIBE 10 MG PO TABS
10.0000 mg | ORAL_TABLET | Freq: Every day | ORAL | Status: DC
  Administered 2024-07-18: 10 mg via ORAL
  Filled 2024-07-17: qty 1

## 2024-07-17 MED ORDER — SODIUM CHLORIDE 0.9 % IV BOLUS
1000.0000 mL | Freq: Once | INTRAVENOUS | Status: AC
  Administered 2024-07-17: 1000 mL via INTRAVENOUS

## 2024-07-17 MED ORDER — PANTOPRAZOLE SODIUM 40 MG IV SOLR
80.0000 mg | Freq: Once | INTRAVENOUS | Status: AC
  Administered 2024-07-17: 80 mg via INTRAVENOUS
  Filled 2024-07-17: qty 20

## 2024-07-17 MED ORDER — ACETAMINOPHEN 325 MG PO TABS
650.0000 mg | ORAL_TABLET | Freq: Four times a day (QID) | ORAL | Status: DC | PRN
  Administered 2024-07-17 – 2024-07-19 (×3): 650 mg via ORAL
  Filled 2024-07-17 (×3): qty 2

## 2024-07-17 MED ORDER — TICAGRELOR 90 MG PO TABS
90.0000 mg | ORAL_TABLET | Freq: Two times a day (BID) | ORAL | Status: DC
  Administered 2024-07-17 – 2024-07-20 (×7): 90 mg via ORAL
  Filled 2024-07-17 (×7): qty 1

## 2024-07-17 MED ORDER — MENTHOL 3 MG MT LOZG
1.0000 | LOZENGE | OROMUCOSAL | Status: DC | PRN
  Administered 2024-07-17: 3 mg via ORAL
  Filled 2024-07-17: qty 9

## 2024-07-17 MED ORDER — ASPIRIN 81 MG PO TBEC
81.0000 mg | DELAYED_RELEASE_TABLET | Freq: Every day | ORAL | Status: DC
  Administered 2024-07-17 – 2024-07-20 (×4): 81 mg via ORAL
  Filled 2024-07-17 (×4): qty 1

## 2024-07-17 MED ORDER — POTASSIUM CHLORIDE CRYS ER 20 MEQ PO TBCR
40.0000 meq | EXTENDED_RELEASE_TABLET | Freq: Once | ORAL | Status: AC
  Administered 2024-07-17: 40 meq via ORAL
  Filled 2024-07-17: qty 2

## 2024-07-17 MED ORDER — ONDANSETRON 4 MG PO TBDP: 4.0000 mg | ORAL_TABLET | Freq: Four times a day (QID) | ORAL | Status: DC | PRN

## 2024-07-17 MED ORDER — METOPROLOL SUCCINATE ER 50 MG PO TB24
50.0000 mg | ORAL_TABLET | Freq: Every day | ORAL | Status: DC
  Administered 2024-07-18 – 2024-07-20 (×3): 50 mg via ORAL
  Filled 2024-07-17: qty 2
  Filled 2024-07-17 (×3): qty 1

## 2024-07-17 MED ORDER — NITROGLYCERIN 0.4 MG SL SUBL: 0.4000 mg | SUBLINGUAL_TABLET | SUBLINGUAL | Status: DC | PRN

## 2024-07-17 MED ORDER — SPIRONOLACTONE 25 MG PO TABS
25.0000 mg | ORAL_TABLET | Freq: Every day | ORAL | Status: DC
  Filled 2024-07-17: qty 2

## 2024-07-17 MED ORDER — ATORVASTATIN CALCIUM 80 MG PO TABS
80.0000 mg | ORAL_TABLET | Freq: Every day | ORAL | Status: DC
  Administered 2024-07-17 – 2024-07-19 (×3): 80 mg via ORAL
  Filled 2024-07-17 (×3): qty 1

## 2024-07-17 MED ORDER — BUTALBITAL-APAP-CAFFEINE 50-325-40 MG PO TABS
1.0000 | ORAL_TABLET | Freq: Four times a day (QID) | ORAL | Status: AC | PRN
  Administered 2024-07-18: 1 via ORAL
  Filled 2024-07-17: qty 1

## 2024-07-17 MED ORDER — IPRATROPIUM-ALBUTEROL 0.5-2.5 (3) MG/3ML IN SOLN
3.0000 mL | RESPIRATORY_TRACT | Status: DC | PRN
  Administered 2024-07-17 – 2024-07-19 (×2): 3 mL via RESPIRATORY_TRACT
  Filled 2024-07-17 (×2): qty 3

## 2024-07-17 MED ORDER — MORPHINE SULFATE (PF) 4 MG/ML IV SOLN
4.0000 mg | INTRAVENOUS | Status: DC | PRN
  Administered 2024-07-17 – 2024-07-18 (×3): 4 mg via INTRAVENOUS
  Filled 2024-07-17 (×3): qty 1

## 2024-07-17 MED ORDER — SODIUM CHLORIDE 0.9 % IV SOLN
12.5000 mg | Freq: Four times a day (QID) | INTRAVENOUS | Status: DC | PRN
  Filled 2024-07-17: qty 0.5

## 2024-07-17 MED ORDER — AMPHETAMINE-DEXTROAMPHET ER 10 MG PO CP24: 60.0000 mg | ORAL_CAPSULE | Freq: Every day | ORAL | Status: DC

## 2024-07-17 MED ORDER — FUROSEMIDE 20 MG PO TABS: 20.0000 mg | ORAL_TABLET | Freq: Every day | ORAL | Status: DC | PRN

## 2024-07-17 MED ORDER — ESCITALOPRAM OXALATE 10 MG PO TABS: 10.0000 mg | ORAL_TABLET | Freq: Every day | ORAL | Status: DC | PRN

## 2024-07-17 MED ORDER — VITAMIN B-12 1000 MCG PO TABS
1000.0000 ug | ORAL_TABLET | Freq: Every day | ORAL | Status: DC
  Administered 2024-07-18 – 2024-07-20 (×3): 1000 ug via ORAL
  Filled 2024-07-17 (×3): qty 1

## 2024-07-17 MED ORDER — LIDOCAINE 5 % EX PTCH
1.0000 | MEDICATED_PATCH | Freq: Every day | CUTANEOUS | Status: DC | PRN
  Administered 2024-07-19: 1 via TRANSDERMAL
  Filled 2024-07-17: qty 1

## 2024-07-17 MED ORDER — ZOLPIDEM TARTRATE 5 MG PO TABS
10.0000 mg | ORAL_TABLET | Freq: Every day | ORAL | Status: DC
  Administered 2024-07-17 – 2024-07-19 (×3): 10 mg via ORAL
  Filled 2024-07-17 (×3): qty 2

## 2024-07-17 MED ORDER — ALPRAZOLAM 0.5 MG PO TABS: 0.5000 mg | ORAL_TABLET | Freq: Every evening | ORAL | Status: DC | PRN

## 2024-07-17 MED ORDER — NICOTINE 21 MG/24HR TD PT24
21.0000 mg | MEDICATED_PATCH | Freq: Every day | TRANSDERMAL | Status: DC | PRN
  Administered 2024-07-17: 21 mg via TRANSDERMAL
  Filled 2024-07-17 (×2): qty 1

## 2024-07-17 MED ORDER — HEPARIN SODIUM (PORCINE) 5000 UNIT/ML IJ SOLN
5000.0000 [IU] | Freq: Three times a day (TID) | INTRAMUSCULAR | Status: DC
  Administered 2024-07-17 – 2024-07-20 (×9): 5000 [IU] via SUBCUTANEOUS
  Filled 2024-07-17 (×9): qty 1

## 2024-07-17 MED ORDER — PANTOPRAZOLE SODIUM 40 MG IV SOLR
40.0000 mg | Freq: Two times a day (BID) | INTRAVENOUS | Status: DC
  Administered 2024-07-17 – 2024-07-18 (×3): 40 mg via INTRAVENOUS
  Filled 2024-07-17 (×3): qty 10

## 2024-07-17 NOTE — Plan of Care (Signed)

## 2024-07-17 NOTE — Assessment & Plan Note (Addendum)
 Influenza A, droplet precaution Patient was exposed likely around Christmas Outside the window for Tamiflu Continue symptomatic support per above

## 2024-07-17 NOTE — Assessment & Plan Note (Addendum)
 Suspect musculoskeletal and myalgia in setting of influenza A infection As needed lidocaine  patch ordered Morphine  4 mg IV every 4 hours as needed for severe pain Continue symptomatic support per above Encourage oral hydration IVF was considered however not given due to patient receiving 1 L bolus in the ED

## 2024-07-17 NOTE — Assessment & Plan Note (Signed)
-   Strict I's and O's

## 2024-07-17 NOTE — Assessment & Plan Note (Signed)
 -  As needed nicotine patch ordered ?

## 2024-07-17 NOTE — ED Notes (Signed)
 Dr. Sherre virtual bedside.

## 2024-07-17 NOTE — Assessment & Plan Note (Signed)
 Home aspirin  81 mg daily, milligrams daily, Brilinta  90 mg p.o. twice daily were resumed on admission

## 2024-07-17 NOTE — Assessment & Plan Note (Signed)
 Home atorvastatin  80 mg nightly, ezetimibe  10 mg daily

## 2024-07-17 NOTE — ED Notes (Signed)
 Called [facility] to transport the patient to [facility] rn# [id number]

## 2024-07-17 NOTE — Hospital Course (Signed)
 [name] is a 54 year old female with history of CAD status post CABG 2024, anxiety, fibromyalgia, hyperlipidemia, continued tobacco smoking, GERD, hypertension.  07/16/2024: Patient presented to the ED for chief concerns of chest pain, abdominal pain, chills, nausea, vomiting, fatigue, diarrhea.  Vitals at the time of my evaluation showed t of 98.3, rr 19, hr 111, blood pressure 166/109, SpO2 of 94% on 2 L nasal cannula.  Serum sodium is 136, potassium 3.2, chloride 95, bicarb 27, BUN of 6, serum creatinine 0.47, eGFR greater than 60, nonfasting blood glucose 125, WBC 4.4, hemoglobin 13.08, platelets of 288.  Alk phos was elevated at 142.  AST 41.  ALT is 25.  HS troponin was less than 15 x 5.  Patient tested positive for influenza A.  ED treatment: Dilaudid  1 mg IV one-time dose, hydroxyzine  25 mg p.o. one-time dose, Ativan  1 mg IV one-time dose, morphine  4 mg IV one-time dose, ondansetron  4 mg IV one-time dose, sodium chloride  2 L bolus.  1/4: Patient accepted to hospitalist service pending bed availability.  1/5: I assumed care of the patient.  Admission via virtual encounter and admission orders are placed.

## 2024-07-17 NOTE — H&P (Addendum)
 " History and Physical - Telemedicine  [name] FMW:[id number] DOB: Apr 21, 1971 DOA: 07/16/2024  PCP: Arloa Elsie SAUNDERS, MD  Patient coming from: home  Referring provider: Rocky Hamilton, PA EDP Telemedicine provider: Dr. Sherre Patient location: [facility] Referring diagnosis: chest pain Patient name and DOB verified: [name], date of birth: May 28, 1971. Patient consented to Telemedicine Evaluation: yes RN virtual assistant: Earle Esters, RN Video encounter time and date: 07/17/2024 and at approximately: . 09:24  Chief Concern: chest pain   HPI: [name] is a 54 year old female with history of CAD status post CABG 2024, anxiety, fibromyalgia, hyperlipidemia, continued tobacco smoking, GERD, hypertension.  07/16/2024: Patient presented to the ED for chief concerns of chest pain, abdominal pain, chills, nausea, vomiting, fatigue, diarrhea.  Vitals at the time of my evaluation showed t of 98.3, rr 19, hr 111, blood pressure 166/109, SpO2 of 94% on 2 L nasal cannula.  Serum sodium is 136, potassium 3.2, chloride 95, bicarb 27, BUN of 6, serum creatinine 0.47, eGFR greater than 60, nonfasting blood glucose 125, WBC 4.4, hemoglobin 13.08, platelets of 288.  Alk phos was elevated at 142.  AST 41.  ALT is 25.  HS troponin was less than 15 x 5.  Patient tested positive for influenza A.  ED treatment: Dilaudid  1 mg IV one-time dose, hydroxyzine  25 mg p.o. one-time dose, Ativan  1 mg IV one-time dose, morphine  4 mg IV one-time dose, ondansetron  4 mg IV one-time dose, sodium chloride  2 L bolus.  1/4: Patient accepted to hospitalist service pending bed availability.  1/5: I assumed care of the patient.  Admission via virtual encounter and admission orders are placed.  She reports sick contacts with her daughter and mother around Christmas and they were sick. She endorses nausea and vomiting, cough, productive of brown, green, yellow  sputum.   Social history: She lives on her own. [name], last drink was 2 weeks ago. She denies recreational drug use. She is disabled.  ROS: Constitutional: no weight change, no fever ENT/Mouth: no sore throat, no rhinorrhea Eyes: no eye pain, no vision changes Cardiovascular: + chest pain, + dyspnea,  no edema, no palpitations Respiratory: no cough, no sputum, no wheezing Gastrointestinal: + nausea, + vomiting, no diarrhea, no constipation Genitourinary: no urinary incontinence, no dysuria, no hematuria Musculoskeletal: no arthralgias, + myalgias Skin: no skin lesions, no pruritus, Neuro: + weakness, no loss of consciousness, no syncope Psych: no anxiety, no depression, + decrease appetite Heme/Lymph: no bruising, no bleeding  Assessment/Plan  Principal Problem:   Chest pain Active Problems:   Influenza   Cardiomyopathy (HCC)   S/P CABG x 4   Heart failure with mildly reduced ejection fraction (HFmrEF, 41-49%) (HCC)   Tobacco use   Hyperlipidemia   Assessment and Plan:  * Chest pain Suspect musculoskeletal and myalgia in setting of influenza A infection As needed lidocaine  patch ordered Morphine  4 mg IV every 4 hours as needed for severe pain Continue symptomatic support per above Encourage oral hydration IVF was considered however not given due to patient receiving 1 L bolus in the ED  Influenza Influenza A, droplet precaution Patient was exposed likely around Christmas Outside the window for Tamiflu Continue symptomatic support per above  Heart failure with mildly reduced ejection fraction (HFmrEF, 41-49%) (HCC) Strict I's and O's  S/P CABG x 4 Home aspirin  81 mg daily, milligrams daily, Brilinta   90 mg p.o. twice daily were resumed on admission  Hyperlipidemia Home atorvastatin  80 mg nightly, ezetimibe  10 mg daily  Tobacco use As needed nicotine  patch ordered  Chart reviewed.   DVT  prophylaxis: heparin  5000u q8h  Code Status: full code  Diet: heart healthy Family Communication: a phone call offered, pt declined  Disposition Plan: pending clinical course  Consults called: none at this time Admission status: telemetry   Past Medical History:  Diagnosis Date   A-fib (HCC)    Anxiety    Arthralgia    Asthma    Attention-deficit hyperactivity disorder, predominantly inattentive type    Back pain    Coronary artery disease    Dysrhythmia    Afib   Fear of flying    Fibromyalgia    denies having Fibromyalgia   Gastroesophageal reflux disease    HPV in female    Joint pain    Localized edema    Neck pain    OA (osteoarthritis)    Papillomavirus as the cause of diseases classified elsewhere    Primary insomnia    Pure hypercholesterolemia    Rheumatoid arthritis, seronegative, hand, unspecified laterality (HCC)    patient feels it is back related, not a true arthritis   Spondylolisthesis of lumbosacral region    Tobacco dependence    Unspecified inflammatory spondylopathy, cervical region    Vitamin B12 deficiency anemia    Voice hoarseness    Past Surgical History:  Procedure Laterality Date   ABDOMINAL EXPOSURE N/A 06/28/2020   Procedure: ABDOMINAL EXPOSURE;  Surgeon: Gretta Lonni PARAS, MD;  Location[facility] OR;  Service: Vascular;  Laterality: N/A;  anterior   ANTERIOR LUMBAR FUSION N/A 06/28/2020   Procedure: ANTERIOR LUMBAR INTERBODY FUSION LUMBAR FIVE-SACRAL ONE.;  Surgeon: Joshua Alm RAMAN, MD;  Location: [facility] OR;  Service: Neurosurgery;  Laterality: N/A;  anterior approach   BIOPSY  01/28/2023   Procedure: BIOPSY;  Surgeon: Kriss Estefana DEL, DO;  Location: [facility];  Service: Gastroenterology;;   BREAST ENHANCEMENT SURGERY     CARDIAC CATHETERIZATION     CARPAL TUNNEL RELEASE Left    CARPAL TUNNEL RELEASE Right 06/28/2020   Procedure: CARPAL TUNNEL RELEASE;  Surgeon: Joshua Alm RAMAN, MD;  Location[facility] OR;  Service: Neurosurgery;  Laterality:  Right;   CERVICAL SPINE SURGERY     CHOLECYSTECTOMY     CLIPPING OF ATRIAL APPENDAGE N/A 10/08/2022   Procedure: CLIPPING OF ATRIAL APPENDAGE USING ATRICURE ATRICLIP PRO2 SIZE 35;  Surgeon: Lucas Dorise POUR, MD;  Location: [facility] OR;  Service: Open Heart Surgery;  Laterality: N/A;   CORONARY ARTERY BYPASS GRAFT N/A 10/08/2022   Procedure: CORONARY ARTERY BYPASS GRAFTING (CABG) X3 USING LEFT INTERNAL MAMMARY ARTERY AND RIGHT GREATER SAPHENOUS VEIN HARVESTED ENDOSCOPICALLY;  Surgeon: Lucas Dorise POUR, MD;  Location: [facility] OR;  Service: Open Heart Surgery;  Laterality: N/A;   CORONARY PRESSURE/FFR STUDY N/A 09/07/2022   Procedure: INTRAVASCULAR PRESSURE WIRE/FFR STUDY;  Surgeon: Mady Lonni, MD;  Location: [facility];  Service: Cardiovascular;  Laterality: N/A;   CORONARY STENT INTERVENTION N/A 10/09/2023   Procedure: CORONARY STENT INTERVENTION;  Surgeon: Wendel Lurena POUR, MD;  Location: [facility] LAB;  Service: Cardiovascular;  Laterality: N/A;   CORONARY/GRAFT ACUTE MI REVASCULARIZATION N/A 10/09/2023   Procedure: Coronary/Graft Acute MI Revascularization;  Surgeon: Wendel Lurena POUR, MD;  Location: [facility] LAB;  Service: Cardiovascular;  Laterality: N/A;   ESOPHAGOGASTRODUODENOSCOPY N/A 01/28/2023   Procedure: ESOPHAGOGASTRODUODENOSCOPY (EGD);  Surgeon: Kriss Estefana DEL,  DO;  Location: [facility];  Service: Gastroenterology;  Laterality: N/A;   FOOT SURGERY     LEFT HEART CATH AND CORONARY ANGIOGRAPHY N/A 10/09/2023   Procedure: LEFT HEART CATH AND CORONARY ANGIOGRAPHY;  Surgeon: Wendel Lurena POUR, MD;  Location: [facility];  Service: Cardiovascular;  Laterality: N/A;   MANDIBLE SURGERY     MAZE N/A 10/08/2022   Procedure: PULMONARY VEIN ABLATION USING ATRICURE ISOLATOR SYNERGY ENCOMPASS OLH;  Surgeon: Lucas Dorise POUR, MD;  Location: [facility] OR;  Service: Open Heart Surgery;  Laterality: N/A;   RIGHT/LEFT HEART CATH AND CORONARY ANGIOGRAPHY N/A 09/07/2022   Procedure:  RIGHT/LEFT HEART CATH AND CORONARY ANGIOGRAPHY;  Surgeon: Mady Bruckner, MD;  Location: [facility] LAB;  Service: Cardiovascular;  Laterality: N/A;   TEE WITHOUT CARDIOVERSION N/A 10/08/2022   Procedure: TRANSESOPHAGEAL ECHOCARDIOGRAM;  Surgeon: Lucas Dorise POUR, MD;  Location: [facility] OR;  Service: Open Heart Surgery;  Laterality: N/A;   TONSILLECTOMY     TUBAL LIGATION     with ablation   TYMPANOSTOMY TUBE PLACEMENT     Social History:  reports that she has been smoking cigarettes. She started smoking about 32 years ago. She has a 32 pack-year smoking history. She has never used smokeless tobacco. She reports current alcohol use. She reports that she does not use drugs.  Allergies[1] Family History  Problem Relation Age of Onset   Hypertension Father    Heart disease Father    Family history: Family history reviewed and not pertinent.  Prior to Admission medications  Medication Sig Start Date End Date Taking? Authorizing Provider  albuterol  (VENTOLIN  HFA) 108 (90 Base) MCG/ACT inhaler Inhale 2 puffs into the lungs every 4 (four) hours as needed for wheezing or shortness of breath. 10/15/22   Roddenberry, Myron G, PA-C  albuterol  (VENTOLIN  HFA) 108 (90 Base) MCG/ACT inhaler Inhale 1-2 puffs into the lungs every 6 (six) hours as needed for wheezing or shortness of breath (wheezing, shortness of breath). 04/10/24   Adrien Winfred Berke, MD  ALPRAZolam  (XANAX ) 0.5 MG tablet 1 tablet Orally once a day for 30 days As needed Patient taking differently: Take 0.5 mg by mouth at bedtime as needed. 10/15/23   [provider]  amphetamine -dextroamphetamine  (ADDERALL XR) 30 MG 24 hr capsule Take 60 mg by mouth daily.    [provider]  Ascorbic Acid  500 MG CAPS as directed Orally once a day; Duration: 90 days    [provider]  aspirin  EC 81 MG tablet Take 1 tablet (81 mg total) by mouth daily. Swallow whole. 01/03/24   Verlin Bruckner BIRCH, MD   aspirin -acetaminophen -caffeine  (EXCEDRIN MIGRAINE) 250-250-65 MG tablet Take 2 tablets by mouth daily as needed.    [provider]  atorvastatin  (LIPITOR ) 80 MG tablet Take 1 tablet (80 mg total) by mouth daily. 09/06/23   Verlin Bruckner BIRCH, MD  cholecalciferol (VITAMIN D3) 25 MCG (1000 UNIT) tablet Take 1,000 Units by mouth daily. 03/07/24   [provider]  cyanocobalamin  (VITAMIN B12) 1000 MCG tablet Take 1,000 mcg by mouth daily.    [provider]  escitalopram  (LEXAPRO ) 10 MG tablet Take 10 mg by mouth daily as needed (anxiety).    [provider]  ezetimibe  (ZETIA ) 10 MG tablet Take 1 tablet (10 mg total) by mouth daily. 01/11/24   Verlin Bruckner BIRCH, MD  furosemide  (LASIX ) 20 MG tablet Take 1 tablet (20 mg total) by mouth daily as needed (for lower extremity edema). 10/11/23   Sheron,  Lorette GRADE, PA-C  gabapentin  (NEURONTIN ) 400 MG capsule Take 800 mg by mouth daily as needed (for pain).    [provider]  icosapent  Ethyl (VASCEPA ) 1 g capsule Take 2 capsules (2 g total) by mouth 2 (two) times daily. 06/22/24   Lucien Orren SAILOR, PA-C  Magnesium  300 MG CAPS daily as needed.    [provider]  metoprolol  succinate (TOPROL -XL) 50 MG 24 hr tablet Take 1/2 tablet ( 25 mg ) daily Patient taking differently: Take 50 mg by mouth daily. Take 1/2 tablet ( 25 mg ) daily 06/07/24   Lucien Orren SAILOR, PA-C  nicotine  (NICODERM CQ  - DOSED IN MG/24 HOURS) 21 mg/24hr patch Place 1 patch (21 mg total) onto the skin daily. 10/11/23   Sheron Lorette GRADE, PA-C  nitroGLYCERIN  (NITROSTAT ) 0.4 MG SL tablet PLACE 1 TABLET UNDER THE TONGUE EVERY 5 MINUTES AS NEEDED FOR CHEST PAIN. 10/05/23   Verlin Lonni BIRCH, MD  omeprazole (PRILOSEC) 20 MG capsule Take 20 mg by mouth daily.    [provider]  spironolactone  (ALDACTONE ) 25 MG tablet Take 1 tablet (25 mg total) by mouth daily. 11/08/23   Lucien Orren SAILOR, PA-C  ticagrelor  (BRILINTA ) 90 MG TABS  tablet Take 1 tablet (90 mg total) by mouth 2 (two) times daily. 03/23/24   Verlin Lonni BIRCH, MD  zolpidem  (AMBIEN ) 10 MG tablet Take 10 mg by mouth at bedtime.    [provider]   Physical Exam completed with assistance of: Cortney Mas, RN, who was at bedside during this portion of the virtual encounter:  Vitals:   07/17/24 0829 07/17/24 0900 07/17/24 1105 07/17/24 1240  BP: (!) 141/102 (!) 158/111  (!) 132/102  Pulse: (!) 121 (!) 123 (!) 128 (!) 132  Resp: 19 (!) 23 15 20   Temp: 98.5 F (36.9 C)   98 F (36.7 C)  TempSrc: Oral   Axillary  SpO2: 98% 92% 94% 97%  Weight:      Height:       Constitutional: appears older than stated age, NAD, calm Eyes: EOMI,  conjunctivae normal ENMT: Mucous membranes are moist. Hearing appropriate Neck: normal, supple, no masses, no thyromegaly Respiratory: bilateral wheezing in lower lobes. Mild increase respiratory effort. No accessory muscle use.  Cardiovascular: Tachycardia with regular rhythm, no murmurs. No extremity edema. Abdomen: no tenderness. Bowel sounds positive.  Musculoskeletal: No joint deformity upper and lower extremities. Good ROM, no contractures, no atrophy. Skin: no rashes, ulcers on visible skin Neurologic: Strength is appropriate upper extremities.  Psychiatric: Normal judgment and insight. Alert and oriented x 3. Normal mood.   EKG: independently reviewed, showing sinus tachycardia 105, qtc 513  Chest x-ray on Admission: I personally reviewed and I agree with radiologist reading as below.  CT Angio Chest PE W and/or Wo Contrast Result Date: 07/16/2024 CLINICAL DATA:  Elevated D-dimer.  Chest/epigastric pain. EXAM: CT ANGIOGRAPHY CHEST WITH CONTRAST TECHNIQUE: Multidetector CT imaging of the chest was performed using the standard protocol during bolus administration of intravenous contrast. Multiplanar CT image reconstructions and MIPs were obtained to evaluate the vascular anatomy. RADIATION DOSE REDUCTION:  This exam was performed according to the departmental dose-optimization program which includes automated exposure control, adjustment of the mA and/or kV according to patient size and/or use of iterative reconstruction technique. CONTRAST:  60mL OMNIPAQUE  IOHEXOL  350 MG/ML SOLN COMPARISON:  04/21/2024. FINDINGS: Cardiovascular: Heart is enlarged and there is no pericardial effusion. Multi-vessel coronary artery calcifications are seen. There is atherosclerotic calcification of the  aorta without evidence of aneurysm. The pulmonary trunk is normal in caliber. No evidence of pulmonary embolism is seen. Mediastinum/Nodes: No mediastinal or axillary lymphadenopathy. Prominent lymph nodes are present in the hilar regions bilaterally measuring up to 1.1 cm. The thyroid gland, trachea, and esophagus are within normal limits. Lungs/Pleura: Scattered regional ground-glass opacities are noted in the lungs bilaterally. Bronchial wall thickening is present in the lower lobes bilaterally. No effusion or pneumothorax is seen. Upper Abdomen: The gallbladder is surgically absent. Bilateral adrenal nodules are noted, previously characterized as adenomas. No acute abnormality. Musculoskeletal: Bilateral breast implants and sternotomy wires are noted. Cervical spinal fusion hardware is seen. There are degenerative changes in the thoracic spine. No acute osseous abnormality. Review of the MIP images confirms the above findings. IMPRESSION: 1. No evidence of pulmonary embolism. 2. Regional ground-glass opacities in the lungs bilaterally, may be associated with air trapping, edema, or pneumonitis. 3. Stable bilateral adrenal adenomas. 4. Coronary artery calcifications and aortic atherosclerosis. Electronically Signed   By: Leita Birmingham M.D.   On: 07/16/2024 17:17   CT ABDOMEN PELVIS W CONTRAST Result Date: 07/16/2024 CLINICAL DATA:  Generalized abdominal pain, diarrhea EXAM: CT ABDOMEN AND PELVIS WITH CONTRAST TECHNIQUE: Multidetector  CT imaging of the abdomen and pelvis was performed using the standard protocol following bolus administration of intravenous contrast. RADIATION DOSE REDUCTION: This exam was performed according to the departmental dose-optimization program which includes automated exposure control, adjustment of the mA and/or kV according to patient size and/or use of iterative reconstruction technique. CONTRAST:  80mL OMNIPAQUE  IOHEXOL  350 MG/ML SOLN COMPARISON:  March 21, 2024 FINDINGS: Lower chest: No acute abnormality. Hepatobiliary: No focal liver abnormality is seen. Status post cholecystectomy. No biliary dilatation. Pancreas: Unremarkable. No pancreatic ductal dilatation or surrounding inflammatory changes. Spleen: Normal in size without focal abnormality. Adrenals/Urinary Tract: Stable bilateral adrenal adenomas. No hydronephrosis or renal obstruction is noted. Urinary bladder is unremarkable. Stomach/Bowel: The stomach and appendix are unremarkable. There is no evidence of bowel obstruction. There is stable mild diffuse wall and fold thickening of the colon which may represent chronic sequela of prior inflammation, although acute inflammation cannot be excluded. However, there does not appear to be a significant amount of pericolonic inflammation to suggest acute inflammation. Vascular/Lymphatic: Aortic atherosclerosis. No enlarged abdominal or pelvic lymph nodes. Reproductive: Uterus and bilateral adnexa are unremarkable. Other: No abdominal wall hernia or abnormality. No abdominopelvic ascites. Musculoskeletal: No acute or significant osseous findings. IMPRESSION: 1. Stable bilateral adrenal adenomas. 2. Stable mild diffuse wall and fold thickening of the colon which may represent chronic sequela of prior inflammation, although acute inflammation cannot be excluded. However, there does not appear to be a significant amount of pericolonic inflammation to suggest acute inflammation. Potentially this may represent  inflammatory bowel disease such as ulcerative colitis. 3. Aortic atherosclerosis. Aortic Atherosclerosis (ICD10-I70.0). Electronically Signed   By: Lynwood Landy Raddle M.D.   On: 07/16/2024 15:27   DG Chest Portable 1 View Result Date: 07/16/2024 CLINICAL DATA:  h/o STEMI, abdominal pain EXAM: PORTABLE CHEST 1 VIEW COMPARISON:  July 04, 2024 FINDINGS: The cardiomediastinal silhouette is unchanged in contour.Status post median sternotomy. No pleural effusion. No pneumothorax. No acute pleuroparenchymal abnormality. IMPRESSION: No acute cardiopulmonary abnormality. Electronically Signed   By: Corean Salter M.D.   On: 07/16/2024 14:29   Labs on Admission: I have personally reviewed following labs  CBC: Recent Labs  Lab 07/16/24 1402  WBC 4.4  HGB 13.8  HCT 39.3  MCV 97.3  PLT 288  Basic Metabolic Panel: Recent Labs  Lab 07/16/24 1402  NA 136  K 3.2*  CL 95*  CO2 27  GLUCOSE 125*  BUN 6  CREATININE 0.47  CALCIUM  9.0   GFR: Estimated Creatinine Clearance: 76.1 mL/min (by C-G formula based on SCr of 0.47 mg/dL).  Liver Function Tests: Recent Labs  Lab 07/16/24 1402  AST 41  ALT 25  ALKPHOS 142*  BILITOT 0.7  PROT 6.8  ALBUMIN  3.9   Recent Labs  Lab 07/16/24 1402  LIPASE 46   Urine analysis:    Component Value Date/Time   COLORURINE YELLOW 07/16/2024 1834   APPEARANCEUR CLEAR 07/16/2024 1834   LABSPEC <1.005 (L) 07/16/2024 1834   PHURINE 7.5 07/16/2024 1834   GLUCOSEU NEGATIVE 07/16/2024 1834   HGBUR NEGATIVE 07/16/2024 1834   BILIRUBINUR NEGATIVE 07/16/2024 1834   KETONESUR NEGATIVE 07/16/2024 1834   PROTEINUR NEGATIVE 07/16/2024 1834   UROBILINOGEN 0.2 08/01/2008 1631   NITRITE NEGATIVE 07/16/2024 1834   LEUKOCYTESUR NEGATIVE 07/16/2024 1834   This document was prepared using Dragon Voice Recognition software and may include unintentional dictation errors.  Dr. Sherre [facility] Location: [place]  If 7PM-7AM, please contact  overnight-coverage provider If 7AM-7PM, please contact day attending provider [email or web address]  07/17/2024, 2:08 PM      [1]  Allergies Allergen Reactions   Zoloft [Sertraline] Other (See Comments)    Altered mental state   "

## 2024-07-18 ENCOUNTER — Observation Stay (HOSPITAL_COMMUNITY)

## 2024-07-18 DIAGNOSIS — I4891 Unspecified atrial fibrillation: Secondary | ICD-10-CM | POA: Diagnosis present

## 2024-07-18 DIAGNOSIS — Z6832 Body mass index (BMI) 32.0-32.9, adult: Secondary | ICD-10-CM | POA: Diagnosis not present

## 2024-07-18 DIAGNOSIS — M069 Rheumatoid arthritis, unspecified: Secondary | ICD-10-CM | POA: Diagnosis present

## 2024-07-18 DIAGNOSIS — I429 Cardiomyopathy, unspecified: Secondary | ICD-10-CM | POA: Diagnosis present

## 2024-07-18 DIAGNOSIS — I110 Hypertensive heart disease with heart failure: Secondary | ICD-10-CM | POA: Diagnosis present

## 2024-07-18 DIAGNOSIS — R1013 Epigastric pain: Secondary | ICD-10-CM | POA: Diagnosis not present

## 2024-07-18 DIAGNOSIS — I2510 Atherosclerotic heart disease of native coronary artery without angina pectoris: Secondary | ICD-10-CM | POA: Diagnosis not present

## 2024-07-18 DIAGNOSIS — Z7982 Long term (current) use of aspirin: Secondary | ICD-10-CM | POA: Diagnosis not present

## 2024-07-18 DIAGNOSIS — F32A Depression, unspecified: Secondary | ICD-10-CM | POA: Diagnosis present

## 2024-07-18 DIAGNOSIS — R002 Palpitations: Secondary | ICD-10-CM | POA: Diagnosis present

## 2024-07-18 DIAGNOSIS — E876 Hypokalemia: Secondary | ICD-10-CM | POA: Diagnosis present

## 2024-07-18 DIAGNOSIS — M797 Fibromyalgia: Secondary | ICD-10-CM | POA: Diagnosis present

## 2024-07-18 DIAGNOSIS — R079 Chest pain, unspecified: Secondary | ICD-10-CM

## 2024-07-18 DIAGNOSIS — Z59819 Housing instability, housed unspecified: Secondary | ICD-10-CM | POA: Diagnosis not present

## 2024-07-18 DIAGNOSIS — I25110 Atherosclerotic heart disease of native coronary artery with unstable angina pectoris: Secondary | ICD-10-CM | POA: Diagnosis present

## 2024-07-18 DIAGNOSIS — J45909 Unspecified asthma, uncomplicated: Secondary | ICD-10-CM | POA: Diagnosis present

## 2024-07-18 DIAGNOSIS — F17210 Nicotine dependence, cigarettes, uncomplicated: Secondary | ICD-10-CM | POA: Diagnosis present

## 2024-07-18 DIAGNOSIS — E861 Hypovolemia: Secondary | ICD-10-CM | POA: Diagnosis present

## 2024-07-18 DIAGNOSIS — Z1152 Encounter for screening for COVID-19: Secondary | ICD-10-CM | POA: Diagnosis not present

## 2024-07-18 DIAGNOSIS — I5022 Chronic systolic (congestive) heart failure: Secondary | ICD-10-CM | POA: Diagnosis present

## 2024-07-18 DIAGNOSIS — E7800 Pure hypercholesterolemia, unspecified: Secondary | ICD-10-CM | POA: Diagnosis present

## 2024-07-18 DIAGNOSIS — Z79899 Other long term (current) drug therapy: Secondary | ICD-10-CM | POA: Diagnosis not present

## 2024-07-18 DIAGNOSIS — J101 Influenza due to other identified influenza virus with other respiratory manifestations: Secondary | ICD-10-CM | POA: Diagnosis present

## 2024-07-18 DIAGNOSIS — Z8249 Family history of ischemic heart disease and other diseases of the circulatory system: Secondary | ICD-10-CM | POA: Diagnosis not present

## 2024-07-18 DIAGNOSIS — Z7902 Long term (current) use of antithrombotics/antiplatelets: Secondary | ICD-10-CM | POA: Diagnosis not present

## 2024-07-18 DIAGNOSIS — J102 Influenza due to other identified influenza virus with gastrointestinal manifestations: Secondary | ICD-10-CM | POA: Diagnosis present

## 2024-07-18 DIAGNOSIS — E669 Obesity, unspecified: Secondary | ICD-10-CM | POA: Diagnosis present

## 2024-07-18 LAB — CBC
HCT: 36 % (ref 36.0–46.0)
Hemoglobin: 12.3 g/dL (ref 12.0–15.0)
MCH: 33.9 pg (ref 26.0–34.0)
MCHC: 34.2 g/dL (ref 30.0–36.0)
MCV: 99.2 fL (ref 80.0–100.0)
Platelets: 229 K/uL (ref 150–400)
RBC: 3.63 MIL/uL — ABNORMAL LOW (ref 3.87–5.11)
RDW: 16.3 % — ABNORMAL HIGH (ref 11.5–15.5)
WBC: 3.1 K/uL — ABNORMAL LOW (ref 4.0–10.5)
nRBC: 0 % (ref 0.0–0.2)

## 2024-07-18 LAB — BASIC METABOLIC PANEL WITH GFR
Anion gap: 9 (ref 5–15)
BUN: <5 mg/dL — ABNORMAL LOW (ref 6–20)
CO2: 26 mmol/L (ref 22–32)
Calcium: 8.1 mg/dL — ABNORMAL LOW (ref 8.9–10.3)
Chloride: 103 mmol/L (ref 98–111)
Creatinine, Ser: 0.46 mg/dL (ref 0.44–1.00)
GFR, Estimated: >60 mL/min
Glucose, Bld: 105 mg/dL — ABNORMAL HIGH (ref 70–99)
Potassium: 3.4 mmol/L — ABNORMAL LOW (ref 3.5–5.1)
Sodium: 138 mmol/L (ref 135–145)

## 2024-07-18 LAB — ECHOCARDIOGRAM COMPLETE
Area-P 1/2: 4.6 cm2
Height: 62 in
MV M vel: 5.04 m/s
MV Peak grad: 101.6 mmHg
S' Lateral: 3.8 cm
Weight: 2860.69 [oz_av]

## 2024-07-18 LAB — MAGNESIUM: Magnesium: 1.3 mg/dL — ABNORMAL LOW (ref 1.7–2.4)

## 2024-07-18 MED ORDER — NICOTINE 21 MG/24HR TD PT24
21.0000 mg | MEDICATED_PATCH | Freq: Every day | TRANSDERMAL | Status: DC
  Administered 2024-07-18 – 2024-07-19 (×2): 21 mg via TRANSDERMAL
  Filled 2024-07-18 (×3): qty 1

## 2024-07-18 MED ORDER — SODIUM CHLORIDE 0.45 % IV SOLN: INTRAVENOUS | Status: DC

## 2024-07-18 MED ORDER — POTASSIUM CHLORIDE CRYS ER 20 MEQ PO TBCR
40.0000 meq | EXTENDED_RELEASE_TABLET | Freq: Once | ORAL | Status: AC
  Administered 2024-07-18: 40 meq via ORAL
  Filled 2024-07-18: qty 2

## 2024-07-18 MED ORDER — SPIRONOLACTONE 25 MG PO TABS: 25.0000 mg | ORAL_TABLET | Freq: Every day | ORAL | Status: DC

## 2024-07-18 MED ORDER — OXYCODONE HCL 5 MG PO TABS
5.0000 mg | ORAL_TABLET | Freq: Once | ORAL | Status: AC
  Administered 2024-07-18: 5 mg via ORAL
  Filled 2024-07-18: qty 1

## 2024-07-18 MED ORDER — MAGNESIUM SULFATE 4 GM/100ML IV SOLN
4.0000 g | Freq: Once | INTRAVENOUS | Status: AC
  Administered 2024-07-18: 4 g via INTRAVENOUS
  Filled 2024-07-18: qty 100

## 2024-07-18 NOTE — TOC Initial Note (Signed)
 Transition of Care [facility]) - Initial/Assessment Note    Patient Details  Name: [name] MRN: [id number] Date of Birth: 1970-10-12  [facility] of [facility] CM/SW Contact:    Marval Gell, RN Phone Number: 07/18/2024, 4:18 PM  Clinical Narrative:                  Patient admitted from home, +Flu, chest pain, smoker.  Currently RA, sinus tachycardia, hypovolemia requiring IVF.  No ICM needs identified for DC at this time, will continue to follow.   Expected Discharge Plan: Home/Self Care Barriers to Discharge: Continued Medical Work up   Patient Goals and CMS Choice            Expected Discharge Plan and Services       Living arrangements for the past 2 months: Single Family Home                                      Prior Living Arrangements/Services Living arrangements for the past 2 months: Single Family Home Lives with:: Self                   Activities of Daily Living   ADL Screening (condition at time of admission) Independently performs ADLs?: Yes (appropriate for developmental age) Is the patient deaf or have difficulty hearing?: No Does the patient have difficulty seeing, even when wearing glasses/contacts?: No Does the patient have difficulty concentrating, remembering, or making decisions?: No  Permission Sought/Granted                  Emotional Assessment              Admission diagnosis:  Palpitations [R00.2] Unstable angina (HCC) [I20.0] SVT (supraventricular tachycardia) [I47.10] PVC (premature ventricular contraction) [I49.3] Epigastric abdominal pain [R10.13] Influenza A [J10.1] Ischemic cardiomyopathy [I25.5] Essential hypertension [I10] Chest pain [R07.9] Coronary artery disease involving native coronary artery of native heart without angina pectoris [I25.10] Atrial fibrillation, unspecified type (HCC) [I48.91] Patient Active Problem List   Diagnosis Date Noted   Influenza 07/17/2024   Chest pain  07/16/2024   Heart failure with mildly reduced ejection fraction (HFmrEF, 41-49%) (HCC) 10/11/2023   Tobacco use 10/11/2023   Hyperlipidemia 10/11/2023   Acute ST elevation myocardial infarction (STEMI) (HCC) 10/09/2023   STEMI (ST elevation myocardial infarction) (HCC) 10/09/2023   Left upper quadrant abdominal pain 01/26/2023   S/P CABG x 4 10/08/2022   Cardiomyopathy (HCC) 09/07/2022   Abnormal cardiac CT angiography 09/07/2022   S/P lumbar fusion 06/28/2020   Chronic back pain 06/25/2020   Polyarthralgia 01/05/2013   Myofascial pain dysfunction syndrome 08/28/2011   PCP:  Arloa Elsie SAUNDERS, MD Pharmacy:   [id number] [place], [place] - [address] [place] [address] Phone: [phone] Fax: [phone]  [id number] - [place], [place] - [address] [place] [address] Phone: [phone] Fax: [phone]  [facility] [address] [place] [address] Phone: [phone] Fax: [phone]     Social Drivers of Health (SDOH) Social History: SDOH Screenings   Food Insecurity: No Food Insecurity (07/17/2024)  Housing: Low Risk (07/17/2024)  Transportation Needs: No Transportation Needs (07/17/2024)  Utilities: Not At Risk (07/17/2024)  Depression (PHQ2-9): Medium Risk (03/16/2023)  Financial Resource Strain: Low Risk (10/02/2023)   Received from [facility]  Social Connections: Unknown (07/17/2024)  Tobacco Use: High Risk (07/17/2024)   SDOH Interventions:     Readmission Risk Interventions     No data to display

## 2024-07-18 NOTE — Progress Notes (Addendum)
 "  [facility] PROGRESS NOTE   [name] FMW:[id number] DOB: 07/15/1970 DOA: 07/16/2024  PCP: Arloa Elsie SAUNDERS, MD  Brief History: 54 year old female with history of CAD status post CABG 2024, anxiety, fibromyalgia, hyperlipidemia, continued tobacco smoking, GERD, hypertension.  Presented with chest pain abdominal pain chills nausea vomiting fatigue and diarrhea.  Patient underwent significant evaluation in the ED which was fairly unremarkable for any acute process.  She did test positive for influenza.  But her symptoms have been ongoing for more than a week and she mentions that she has completed course of Tamiflu.  Consultants: None  Procedures: Echo cardiogram    Subjective/Interval History: Patient mentioned that she does not feel much better.  Still feeling nauseated.  Having occasional loose stools but none overnight.  Continues to have pressure sensation over the chest.  She was told about her CT angiogram which did not show any blood clots.  She was told about negative troponin levels as well.  She was reassured.    Assessment/Plan:  Chest pain CTA ruled out PE.  Troponins were normal.  Could be experiencing discomfort from recent influenza infection.  Continue with Lidoderm  patch. Follow-up on echocardiogram.  Sinus tachycardia/hypovolemia Appears to be hypovolemic this morning.  Most likely due to recent influenza and GI symptoms.  Gently hydrate her.  Noted that she has a history of systolic CHF. Sinus tachycardia is most likely due to hypovolemia.  Monitor on telemetry.   Check TSH.  Follow-up on echocardiogram. Hold Adderall.  Nausea vomiting/diarrhea Most likely due to influenza.  CT abdomen did not show any acute findings.  Continue to monitor.  Influenza This was during Christmas.  She told me this morning that she did take Tamiflu at that time.  No indication to resume Tamiflu at this time.  She is afebrile.  Chronic systolic CHF Noted to be  hypovolemic.  Will hold her spironolactone .  Not on any other diuretics at this time. Last echocardiogram is from March 2025 which showed LVEF of 40 to 45%.  RV systolic function was normal.  Will repeat echocardiogram.  Coronary artery disease status post CABG Cardiac status is stable.  Continue aspirin  and Brilinta .  Ruled out for ACS by negative troponins.  Hypokalemia/hypomagnesemia Will be supplemented.  Hyperlipidemia Continue her home medications.  Tobacco abuse Nicotine  patch.  Counseled.  Obesity Estimated body mass index is 32.7 kg/m as calculated from the following:   Height as of this encounter: 5' 2 (1.575 m).   Weight as of this encounter: 81.1 kg.   DVT Prophylaxis: Subcutaneous heparin  Code Status: Full code Family Communication: Discussed with patient Disposition Plan: Home when improved  Status is: Observation The patient will require care spanning > 2 midnights and should be moved to inpatient because: Sinus tachycardia, hypovolemia requiring IV fluid      Medications: Scheduled:  aspirin  EC  81 mg Oral Daily   atorvastatin   80 mg Oral QHS   cyanocobalamin   1,000 mcg Oral Daily   ezetimibe   10 mg Oral Daily   heparin   5,000 Units Subcutaneous Q8H   metoprolol  succinate  50 mg Oral Daily   pantoprazole  (PROTONIX ) IV  40 mg Intravenous BID   [START ON 07/19/2024] spironolactone   25 mg Oral Daily   ticagrelor   90 mg Oral BID   zolpidem   10 mg Oral QHS   Continuous:  sodium chloride      magnesium  sulfate bolus IVPB 4 g (07/18/24 1032)   PRN:acetaminophen  **OR** acetaminophen , ALPRAZolam , butalbital -acetaminophen -caffeine , escitalopram ,  ipratropium-albuterol , lidocaine , menthol , nicotine , nitroGLYCERIN , ondansetron   Antibiotics: Anti-infectives (From admission, onward)    None       Objective:  Vital Signs  Vitals:   07/17/24 2338 07/18/24 0344 07/18/24 0655 07/18/24 0811  BP: (!) 134/97 (!) 164/105 (!) 155/116 (!) 141/100  Pulse: (!)  119 (!) 126 (!) 126 (!) 120  Resp: 18 18  18   Temp: 98.6 F (37 C) 98.6 F (37 C)  98.7 F (37.1 C)  TempSrc: Oral Oral  Oral  SpO2: 90% 92% 94% 95%  Weight:      Height:        Intake/Output Summary (Last 24 hours) at 07/18/2024 1039 Last data filed at 07/18/2024 0345 Gross per 24 hour  Intake 1587.09 ml  Output 0 ml  Net 1587.09 ml   Filed Weights   07/16/24 1352 07/17/24 1503  Weight: 74.8 kg 81.1 kg    General appearance: Awake alert.  In no distress Resp: Clear to auscultation bilaterally.  Normal effort Cardio: S1-S2 is tachycardic regular.  No S3-S4. GI: Abdomen is soft.  Nontender nondistended.  Bowel sounds are present normal.  No masses organomegaly Extremities: No edema.  Full range of motion of lower extremities.  Physical deconditioning noted. Neurologic: Alert and oriented x3.  No focal neurological deficits.    Lab Results:  Data Reviewed: I have personally reviewed following labs and reports of the imaging studies  CBC: Recent Labs  Lab 07/16/24 1402 07/18/24 0423  WBC 4.4 3.1*  HGB 13.8 12.3  HCT 39.3 36.0  MCV 97.3 99.2  PLT 288 229    Basic Metabolic Panel: Recent Labs  Lab 07/16/24 1402 07/18/24 0423  NA 136 138  K 3.2* 3.4*  CL 95* 103  CO2 27 26  GLUCOSE 125* 105*  BUN 6 <5*  CREATININE 0.47 0.46  CALCIUM  9.0 8.1*  MG  --  1.3*    GFR: Estimated Creatinine Clearance: 79.3 mL/min (by C-G formula based on SCr of 0.46 mg/dL).  Liver Function Tests: Recent Labs  Lab 07/16/24 1402  AST 41  ALT 25  ALKPHOS 142*  BILITOT 0.7  PROT 6.8  ALBUMIN  3.9    Recent Labs  Lab 07/16/24 1402  LIPASE 46   BNP (last 3 results) Recent Labs    07/17/24 1623  PROBNP 1,243.0*    Recent Results (from the past 240 hours)  Resp panel by RT-PCR (RSV, Flu A&B, Covid) Anterior Nasal Swab     Status: Abnormal   Collection Time: 07/16/24  2:00 PM   Specimen: Anterior Nasal Swab  Result Value Ref Range Status   SARS Coronavirus 2 by RT  PCR NEGATIVE NEGATIVE Final    Comment: (NOTE) SARS-CoV-2 target nucleic acids are NOT DETECTED.  The SARS-CoV-2 RNA is generally detectable in upper respiratory specimens during the acute phase of infection. The lowest concentration of SARS-CoV-2 viral copies this assay can detect is 138 copies/mL. A negative result does not preclude SARS-Cov-2 infection and should not be used as the sole basis for treatment or other patient management decisions. A negative result may occur with  improper specimen collection/handling, submission of specimen other than nasopharyngeal swab, presence of viral mutation(s) within the areas targeted by this assay, and inadequate number of viral copies(<138 copies/mL). A negative result must be combined with clinical observations, patient history, and epidemiological information. The expected result is Negative.  Fact Sheet for Patients:  bloggercourse.com  Fact Sheet for Healthcare Providers:  seriousbroker.it  This test is no t  yet approved or cleared by the [place]  FDA and  has been authorized for detection and/or diagnosis of SARS-CoV-2 by FDA under an Emergency Use Authorization (EUA). This EUA will remain  in effect (meaning this test can be used) for the duration of the COVID-19 declaration under Section 564(b)(1) of the Act, 21 U.S.C.section 360bbb-3(b)(1), unless the authorization is terminated  or revoked sooner.       Influenza A by PCR POSITIVE (A) NEGATIVE Final   Influenza B by PCR NEGATIVE NEGATIVE Final    Comment: (NOTE) The Xpert Xpress SARS-CoV-2/FLU/RSV plus assay is intended as an aid in the diagnosis of influenza from Nasopharyngeal swab specimens and should not be used as a sole basis for treatment. Nasal washings and aspirates are unacceptable for Xpert Xpress SARS-CoV-2/FLU/RSV testing.  Fact Sheet for Patients: bloggercourse.com  Fact Sheet  for Healthcare Providers: seriousbroker.it  This test is not yet approved or cleared by the [place]  FDA and has been authorized for detection and/or diagnosis of SARS-CoV-2 by FDA under an Emergency Use Authorization (EUA). This EUA will remain in effect (meaning this test can be used) for the duration of the COVID-19 declaration under Section 564(b)(1) of the Act, 21 U.S.C. section 360bbb-3(b)(1), unless the authorization is terminated or revoked.     Resp Syncytial Virus by PCR NEGATIVE NEGATIVE Final    Comment: (NOTE) Fact Sheet for Patients: bloggercourse.com  Fact Sheet for Healthcare Providers: seriousbroker.it  This test is not yet approved or cleared by the [place]  FDA and has been authorized for detection and/or diagnosis of SARS-CoV-2 by FDA under an Emergency Use Authorization (EUA). This EUA will remain in effect (meaning this test can be used) for the duration of the COVID-19 declaration under Section 564(b)(1) of the Act, 21 U.S.C. section 360bbb-3(b)(1), unless the authorization is terminated or revoked.  Performed at [facility], [address], [place], [place] [address]       Radiology Studies: CT Angio Chest PE W and/or Wo Contrast Result Date: 07/16/2024 CLINICAL DATA:  Elevated D-dimer.  Chest/epigastric pain. EXAM: CT ANGIOGRAPHY CHEST WITH CONTRAST TECHNIQUE: Multidetector CT imaging of the chest was performed using the standard protocol during bolus administration of intravenous contrast. Multiplanar CT image reconstructions and MIPs were obtained to evaluate the vascular anatomy. RADIATION DOSE REDUCTION: This exam was performed according to the departmental dose-optimization program which includes automated exposure control, adjustment of the mA and/or kV according to patient size and/or use of iterative reconstruction technique. CONTRAST:  60mL  OMNIPAQUE  IOHEXOL  350 MG/ML SOLN COMPARISON:  04/21/2024. FINDINGS: Cardiovascular: Heart is enlarged and there is no pericardial effusion. Multi-vessel coronary artery calcifications are seen. There is atherosclerotic calcification of the aorta without evidence of aneurysm. The pulmonary trunk is normal in caliber. No evidence of pulmonary embolism is seen. Mediastinum/Nodes: No mediastinal or axillary lymphadenopathy. Prominent lymph nodes are present in the hilar regions bilaterally measuring up to 1.1 cm. The thyroid gland, trachea, and esophagus are within normal limits. Lungs/Pleura: Scattered regional ground-glass opacities are noted in the lungs bilaterally. Bronchial wall thickening is present in the lower lobes bilaterally. No effusion or pneumothorax is seen. Upper Abdomen: The gallbladder is surgically absent. Bilateral adrenal nodules are noted, previously characterized as adenomas. No acute abnormality. Musculoskeletal: Bilateral breast implants and sternotomy wires are noted. Cervical spinal fusion hardware is seen. There are degenerative changes in the thoracic spine. No acute osseous abnormality. Review of the MIP images confirms the above findings. IMPRESSION: 1. No evidence of pulmonary  embolism. 2. Regional ground-glass opacities in the lungs bilaterally, may be associated with air trapping, edema, or pneumonitis. 3. Stable bilateral adrenal adenomas. 4. Coronary artery calcifications and aortic atherosclerosis. Electronically Signed   By: Leita Birmingham M.D.   On: 07/16/2024 17:17   CT ABDOMEN PELVIS W CONTRAST Result Date: 07/16/2024 CLINICAL DATA:  Generalized abdominal pain, diarrhea EXAM: CT ABDOMEN AND PELVIS WITH CONTRAST TECHNIQUE: Multidetector CT imaging of the abdomen and pelvis was performed using the standard protocol following bolus administration of intravenous contrast. RADIATION DOSE REDUCTION: This exam was performed according to the departmental dose-optimization program which  includes automated exposure control, adjustment of the mA and/or kV according to patient size and/or use of iterative reconstruction technique. CONTRAST:  80mL OMNIPAQUE  IOHEXOL  350 MG/ML SOLN COMPARISON:  March 21, 2024 FINDINGS: Lower chest: No acute abnormality. Hepatobiliary: No focal liver abnormality is seen. Status post cholecystectomy. No biliary dilatation. Pancreas: Unremarkable. No pancreatic ductal dilatation or surrounding inflammatory changes. Spleen: Normal in size without focal abnormality. Adrenals/Urinary Tract: Stable bilateral adrenal adenomas. No hydronephrosis or renal obstruction is noted. Urinary bladder is unremarkable. Stomach/Bowel: The stomach and appendix are unremarkable. There is no evidence of bowel obstruction. There is stable mild diffuse wall and fold thickening of the colon which may represent chronic sequela of prior inflammation, although acute inflammation cannot be excluded. However, there does not appear to be a significant amount of pericolonic inflammation to suggest acute inflammation. Vascular/Lymphatic: Aortic atherosclerosis. No enlarged abdominal or pelvic lymph nodes. Reproductive: Uterus and bilateral adnexa are unremarkable. Other: No abdominal wall hernia or abnormality. No abdominopelvic ascites. Musculoskeletal: No acute or significant osseous findings. IMPRESSION: 1. Stable bilateral adrenal adenomas. 2. Stable mild diffuse wall and fold thickening of the colon which may represent chronic sequela of prior inflammation, although acute inflammation cannot be excluded. However, there does not appear to be a significant amount of pericolonic inflammation to suggest acute inflammation. Potentially this may represent inflammatory bowel disease such as ulcerative colitis. 3. Aortic atherosclerosis. Aortic Atherosclerosis (ICD10-I70.0). Electronically Signed   By: Lynwood Landy Raddle M.D.   On: 07/16/2024 15:27   DG Chest Portable 1 View Result Date:  07/16/2024 CLINICAL DATA:  h/o STEMI, abdominal pain EXAM: PORTABLE CHEST 1 VIEW COMPARISON:  July 04, 2024 FINDINGS: The cardiomediastinal silhouette is unchanged in contour.Status post median sternotomy. No pleural effusion. No pneumothorax. No acute pleuroparenchymal abnormality. IMPRESSION: No acute cardiopulmonary abnormality. Electronically Signed   By: Corean Salter M.D.   On: 07/16/2024 14:29       LOS: 0 days   Braelon Sprung  [facility] Pager on [email or web address]  07/18/2024, 10:39 AM   "

## 2024-07-18 NOTE — Plan of Care (Signed)

## 2024-07-18 NOTE — Progress Notes (Signed)
 Echocardiogram 2D Echocardiogram has been performed.  [name] 07/18/2024, 3:42 PM

## 2024-07-19 DIAGNOSIS — J101 Influenza due to other identified influenza virus with other respiratory manifestations: Secondary | ICD-10-CM

## 2024-07-19 DIAGNOSIS — R1013 Epigastric pain: Secondary | ICD-10-CM

## 2024-07-19 LAB — CBC
HCT: 35.9 % — ABNORMAL LOW (ref 36.0–46.0)
Hemoglobin: 12.5 g/dL (ref 12.0–15.0)
MCH: 34.5 pg — ABNORMAL HIGH (ref 26.0–34.0)
MCHC: 34.8 g/dL (ref 30.0–36.0)
MCV: 99.2 fL (ref 80.0–100.0)
Platelets: 222 K/uL (ref 150–400)
RBC: 3.62 MIL/uL — ABNORMAL LOW (ref 3.87–5.11)
RDW: 16.3 % — ABNORMAL HIGH (ref 11.5–15.5)
WBC: 4.3 K/uL (ref 4.0–10.5)
nRBC: 0 % (ref 0.0–0.2)

## 2024-07-19 LAB — BASIC METABOLIC PANEL WITH GFR
Anion gap: 7 (ref 5–15)
BUN: <5 mg/dL — ABNORMAL LOW (ref 6–20)
CO2: 29 mmol/L (ref 22–32)
Calcium: 8.5 mg/dL — ABNORMAL LOW (ref 8.9–10.3)
Chloride: 100 mmol/L (ref 98–111)
Creatinine, Ser: 0.48 mg/dL (ref 0.44–1.00)
GFR, Estimated: >60 mL/min
Glucose, Bld: 107 mg/dL — ABNORMAL HIGH (ref 70–99)
Potassium: 4.4 mmol/L (ref 3.5–5.1)
Sodium: 136 mmol/L (ref 135–145)

## 2024-07-19 LAB — TSH: TSH: 0.627 u[IU]/mL (ref 0.350–4.500)

## 2024-07-19 LAB — MAGNESIUM: Magnesium: 2.2 mg/dL (ref 1.7–2.4)

## 2024-07-19 MED ORDER — BUTALBITAL-APAP-CAFFEINE 50-325-40 MG PO TABS
1.0000 | ORAL_TABLET | Freq: Four times a day (QID) | ORAL | Status: DC | PRN
  Administered 2024-07-19: 1 via ORAL
  Filled 2024-07-19: qty 1

## 2024-07-19 MED ORDER — ALPRAZOLAM 0.5 MG PO TABS
0.5000 mg | ORAL_TABLET | Freq: Every day | ORAL | Status: DC | PRN
  Administered 2024-07-20: 0.5 mg via ORAL
  Filled 2024-07-19: qty 1

## 2024-07-19 MED ORDER — PANTOPRAZOLE SODIUM 40 MG PO TBEC
40.0000 mg | DELAYED_RELEASE_TABLET | Freq: Two times a day (BID) | ORAL | Status: DC
  Administered 2024-07-19 – 2024-07-20 (×3): 40 mg via ORAL
  Filled 2024-07-19 (×3): qty 1

## 2024-07-19 MED ORDER — EZETIMIBE 10 MG PO TABS
10.0000 mg | ORAL_TABLET | Freq: Every day | ORAL | Status: DC
  Administered 2024-07-19: 10 mg via ORAL
  Filled 2024-07-19: qty 1

## 2024-07-19 MED ORDER — SUCRALFATE 1 GM/10ML PO SUSP
1.0000 g | Freq: Three times a day (TID) | ORAL | Status: DC
  Administered 2024-07-19 – 2024-07-20 (×2): 1 g via ORAL
  Filled 2024-07-19 (×2): qty 10

## 2024-07-19 MED ORDER — OXYCODONE HCL 5 MG PO TABS
5.0000 mg | ORAL_TABLET | Freq: Four times a day (QID) | ORAL | Status: DC | PRN
  Administered 2024-07-19: 5 mg via ORAL
  Filled 2024-07-19: qty 1

## 2024-07-19 MED ORDER — ICOSAPENT ETHYL 1 G PO CAPS
2.0000 g | ORAL_CAPSULE | Freq: Two times a day (BID) | ORAL | Status: DC
  Administered 2024-07-19 – 2024-07-20 (×3): 2 g via ORAL
  Filled 2024-07-19 (×3): qty 2

## 2024-07-19 MED ORDER — GABAPENTIN 400 MG PO CAPS
800.0000 mg | ORAL_CAPSULE | Freq: Every day | ORAL | Status: DC | PRN
  Administered 2024-07-19: 800 mg via ORAL
  Filled 2024-07-19: qty 2

## 2024-07-19 MED ORDER — RISAQUAD PO CAPS
2.0000 | ORAL_CAPSULE | Freq: Every day | ORAL | Status: DC
  Administered 2024-07-19 – 2024-07-20 (×2): 2 via ORAL
  Filled 2024-07-19 (×2): qty 2

## 2024-07-19 NOTE — Plan of Care (Signed)

## 2024-07-19 NOTE — Progress Notes (Signed)
 "  [facility] PROGRESS NOTE   [name] FMW:[id number] DOB: [birth year] DOA: 07/16/2024  PCP: Arloa Elsie SAUNDERS, MD  Brief History: 54 year old female with history of CAD status post CABG 2024, anxiety, fibromyalgia, hyperlipidemia, continued tobacco smoking, GERD, hypertension.  Presented with chest pain abdominal pain chills nausea vomiting fatigue and diarrhea.  Patient underwent significant evaluation in the ED which was fairly unremarkable for any acute process.  She did test positive for influenza.  But her symptoms have been ongoing for more than a week and she mentions that she has completed course of Tamiflu.  Consultants: None  Procedures: Echo cardiogram    Subjective/Interval History: Patient mentioned that she does not feel much better.  Still feeling nauseated.  Having occasional loose stools but none overnight.  Continues to have pressure sensation over the chest.  She was told about her CT angiogram which did not show any blood clots.  She was told about negative troponin levels as well.  She was reassured.    Assessment/Plan:  Chest pain CTA ruled out PE.  Troponins were normal.  Could be experiencing discomfort from recent influenza infection.  Continue with Lidoderm  patch. -resolved  Sinus tachycardia/hypovolemia Appears to be hypovolemic this morning.  Most likely due to recent influenza and GI symptoms.  Gently hydrate her.  Noted that she has a history of systolic CHF. Sinus tachycardia is most likely due to hypovolemia.  Monitor on telemetry.   Hold Adderall.  Nausea vomiting/diarrhea Most likely due to influenza.  CT abdomen did not show any acute findings.  Continue to monitor. -trial of PPI BID and carafate   Influenza This was during Christmas.  She told me this morning that she did take Tamiflu at that time.  No indication to resume Tamiflu at this time.  She is afebrile.  Chronic systolic CHF Noted to be hypovolemic.  Will hold her  spironolactone .  Not on any other diuretics at this time. Last echocardiogram is from March 2025 which showed LVEF of 40 to 45%.  RV systolic function was normal- repeat echo-- similar to improved  Coronary artery disease status post CABG Cardiac status is stable.  Continue aspirin  and Brilinta .  Ruled out for ACS by negative troponins.  Hypokalemia/hypomagnesemia Will be supplemented.  Hyperlipidemia Continue her home medications.  Tobacco abuse Nicotine  patch.  Counseled.  Obesity Estimated body mass index is 32.7 kg/m as calculated from the following:   Height as of this encounter: 5' 2 (1.575 m).   Weight as of this encounter: 81.1 kg.   DVT Prophylaxis: Subcutaneous heparin  Code Status: Full code Family Communication: Discussed with patient Disposition Plan: Home 24-48 hours       Medications: Scheduled:  aspirin  EC  81 mg Oral Daily   atorvastatin   80 mg Oral QHS   cyanocobalamin   1,000 mcg Oral Daily   ezetimibe   10 mg Oral QHS   heparin   5,000 Units Subcutaneous Q8H   icosapent  Ethyl  2 g Oral BID   metoprolol  succinate  50 mg Oral Daily   nicotine   21 mg Transdermal Daily   pantoprazole   40 mg Oral BID   sucralfate   1 g Oral TID WC & HS   ticagrelor   90 mg Oral BID   zolpidem   10 mg Oral QHS   Continuous:   PRN:acetaminophen  **OR** acetaminophen , ALPRAZolam , butalbital -acetaminophen -caffeine , escitalopram , ipratropium-albuterol , lidocaine , menthol , nitroGLYCERIN , ondansetron , oxyCODONE   Antibiotics: Anti-infectives (From admission, onward)    None       Objective:  Vital Signs  Vitals:   07/19/24 0524 07/19/24 0832 07/19/24 0934 07/19/24 1240  BP: (!) 153/107 (!) 152/109 (!) 152/105 (!) 135/97  Pulse: 97 (!) 106 (!) 103 97  Resp: 17 17  16   Temp: 98 F (36.7 C) 98.6 F (37 C)  98.4 F (36.9 C)  TempSrc: Oral Oral  Oral  SpO2: 90% 93%  96%  Weight:      Height:        Intake/Output Summary (Last 24 hours) at 07/19/2024 1339 Last  data filed at 07/19/2024 0715 Gross per 24 hour  Intake 1134 ml  Output 0 ml  Net 1134 ml   Filed Weights   07/16/24 1352 07/17/24 1503  Weight: 74.8 kg 81.1 kg     General: Appearance:    Obese female in no acute distress     Lungs:     Clear to auscultation bilaterally, respirations unlabored  Heart:    Normal heart rate. Normal rhythm. No murmurs, rubs, or gallops.   MS:   All extremities are intact.   Neurologic:   Awake, alert, oriented x 3. No apparent focal neurological           defect.       Lab Results:  Data Reviewed: I have personally reviewed following labs and reports of the imaging studies  CBC: Recent Labs  Lab 07/16/24 1402 07/18/24 0423 07/19/24 0453  WBC 4.4 3.1* 4.3  HGB 13.8 12.3 12.5  HCT 39.3 36.0 35.9*  MCV 97.3 99.2 99.2  PLT 288 229 222    Basic Metabolic Panel: Recent Labs  Lab 07/16/24 1402 07/18/24 0423 07/19/24 0453  NA 136 138 136  K 3.2* 3.4* 4.4  CL 95* 103 100  CO2 27 26 29   GLUCOSE 125* 105* 107*  BUN 6 <5* <5*  CREATININE 0.47 0.46 0.48  CALCIUM  9.0 8.1* 8.5*  MG  --  1.3* 2.2    GFR: Estimated Creatinine Clearance: 79.3 mL/min (by C-G formula based on SCr of 0.48 mg/dL).  Liver Function Tests: Recent Labs  Lab 07/16/24 1402  AST 41  ALT 25  ALKPHOS 142*  BILITOT 0.7  PROT 6.8  ALBUMIN  3.9    Recent Labs  Lab 07/16/24 1402  LIPASE 46   BNP (last 3 results) Recent Labs    07/17/24 1623  PROBNP 1,243.0*    Recent Results (from the past 240 hours)  Resp panel by RT-PCR (RSV, Flu A&B, Covid) Anterior Nasal Swab     Status: Abnormal   Collection Time: 07/16/24  2:00 PM   Specimen: Anterior Nasal Swab  Result Value Ref Range Status   SARS Coronavirus 2 by RT PCR NEGATIVE NEGATIVE Final    Comment: (NOTE) SARS-CoV-2 target nucleic acids are NOT DETECTED.  The SARS-CoV-2 RNA is generally detectable in upper respiratory specimens during the acute phase of infection. The lowest concentration of  SARS-CoV-2 viral copies this assay can detect is 138 copies/mL. A negative result does not preclude SARS-Cov-2 infection and should not be used as the sole basis for treatment or other patient management decisions. A negative result may occur with  improper specimen collection/handling, submission of specimen other than nasopharyngeal swab, presence of viral mutation(s) within the areas targeted by this assay, and inadequate number of viral copies(<138 copies/mL). A negative result must be combined with clinical observations, patient history, and epidemiological information. The expected result is Negative.  Fact Sheet for Patients:  bloggercourse.com  Fact Sheet for Healthcare Providers:  seriousbroker.it  This test is  no t yet approved or cleared by the [place]  FDA and  has been authorized for detection and/or diagnosis of SARS-CoV-2 by FDA under an Emergency Use Authorization (EUA). This EUA will remain  in effect (meaning this test can be used) for the duration of the COVID-19 declaration under Section 564(b)(1) of the Act, 21 U.S.C.section 360bbb-3(b)(1), unless the authorization is terminated  or revoked sooner.       Influenza A by PCR POSITIVE (A) NEGATIVE Final   Influenza B by PCR NEGATIVE NEGATIVE Final    Comment: (NOTE) The Xpert Xpress SARS-CoV-2/FLU/RSV plus assay is intended as an aid in the diagnosis of influenza from Nasopharyngeal swab specimens and should not be used as a sole basis for treatment. Nasal washings and aspirates are unacceptable for Xpert Xpress SARS-CoV-2/FLU/RSV testing.  Fact Sheet for Patients: bloggercourse.com  Fact Sheet for Healthcare Providers: seriousbroker.it  This test is not yet approved or cleared by the [place]  FDA and has been authorized for detection and/or diagnosis of SARS-CoV-2 by FDA under an Emergency Use  Authorization (EUA). This EUA will remain in effect (meaning this test can be used) for the duration of the COVID-19 declaration under Section 564(b)(1) of the Act, 21 U.S.C. section 360bbb-3(b)(1), unless the authorization is terminated or revoked.     Resp Syncytial Virus by PCR NEGATIVE NEGATIVE Final    Comment: (NOTE) Fact Sheet for Patients: bloggercourse.com  Fact Sheet for Healthcare Providers: seriousbroker.it  This test is not yet approved or cleared by the [place]  FDA and has been authorized for detection and/or diagnosis of SARS-CoV-2 by FDA under an Emergency Use Authorization (EUA). This EUA will remain in effect (meaning this test can be used) for the duration of the COVID-19 declaration under Section 564(b)(1) of the Act, 21 U.S.C. section 360bbb-3(b)(1), unless the authorization is terminated or revoked.  Performed at [facility], [address], [place], [place] [address]       Radiology Studies: ECHOCARDIOGRAM COMPLETE Result Date: 07/18/2024    ECHOCARDIOGRAM REPORT   Patient Name:   [name] Date of Exam: 07/18/2024 Medical Rec #:  [id number]         Height:       62.0 in Accession #:    [id number]        Weight:       178.8 lb Date of Birth:  1971-07-01         BSA:          1.823 m Patient Age:    53 years          BP:           141/100 mmHg Patient Gender: F                 HR:           98 bpm. Exam Location:  Inpatient Procedure: 2D Echo, Cardiac Doppler and Color Doppler (Both Spectral and Color            Flow Doppler were utilized during procedure). Indications:    Chest Pain R07.9  History:        Patient has prior history of Echocardiogram examinations, most                 recent 10/10/2023. Previous Myocardial Infarction and CAD, Prior                 CABG; Risk Factors:Dyslipidemia and Current Smoker.  Sonographer:    Merlynn  Park Referring Phys: [id number]   Sonographer Comments: Image acquisition challenging due to respiratory motion and Image acquisition challenging due to breast implants. IMPRESSIONS  1. Left ventricular ejection fraction, by estimation, is 50 to 55%. The left ventricle has low normal function. The left ventricle has no regional wall motion abnormalities. There is mild concentric left ventricular hypertrophy. Left ventricular diastolic parameters are indeterminate.  2. Right ventricular systolic function is normal. The right ventricular size is normal.  3. Left atrial size was severely dilated.  4. The mitral valve is normal in structure. Mild mitral valve regurgitation. No evidence of mitral stenosis.  5. The aortic valve is tricuspid. There is mild calcification of the aortic valve. Aortic valve regurgitation is not visualized. No aortic stenosis is present. Comparison(s): Function has improved from prior study. FINDINGS  Left Ventricle: Left ventricular ejection fraction, by estimation, is 50 to 55%. The left ventricle has low normal function. The left ventricle has no regional wall motion abnormalities. The left ventricular internal cavity size was normal in size. There is mild concentric left ventricular hypertrophy. Left ventricular diastolic parameters are indeterminate. Right Ventricle: The right ventricular size is normal. No increase in right ventricular wall thickness. Right ventricular systolic function is normal. Left Atrium: Left atrial size was severely dilated. Right Atrium: Right atrial size was normal in size. Pericardium: There is no evidence of pericardial effusion. Mitral Valve: The mitral valve is normal in structure. Mild mitral valve regurgitation. No evidence of mitral valve stenosis. Tricuspid Valve: The tricuspid valve is normal in structure. Tricuspid valve regurgitation is not demonstrated. No evidence of tricuspid stenosis. Aortic Valve: The aortic valve is tricuspid. There is mild calcification of the aortic valve.  Aortic valve regurgitation is not visualized. No aortic stenosis is present. Pulmonic Valve: The pulmonic valve was normal in structure. Pulmonic valve regurgitation is not visualized. No evidence of pulmonic stenosis. Aorta: The aortic root and ascending aorta are structurally normal, with no evidence of dilitation. IAS/Shunts: No atrial level shunt detected by color flow Doppler.  LEFT VENTRICLE PLAX 2D LVIDd:         5.20 cm   Diastology LVIDs:         3.80 cm   LV e' lateral:   11.70 cm/s LV PW:         1.10 cm   LV E/e' lateral: 11.0 LV IVS:        1.10 cm LVOT diam:     2.00 cm LV SV:         60 LV SV Index:   33 LVOT Area:     3.14 cm  RIGHT VENTRICLE            IVC RV Basal diam:  3.70 cm    IVC diam: 1.40 cm RV S prime:     8.27 cm/s TAPSE (M-mode): 1.6 cm LEFT ATRIUM           Index        RIGHT ATRIUM           Index LA diam:      4.20 cm 2.30 cm/m   RA Area:     11.00 cm LA Vol (A2C): 57.6 ml 31.60 ml/m  RA Volume:   22.60 ml  12.40 ml/m LA Vol (A4C): 89.3 ml 48.99 ml/m  AORTIC VALVE LVOT Vmax:   105.00 cm/s LVOT Vmean:  68.400 cm/s LVOT VTI:    0.190 m  AORTA Ao Root diam: 3.00 cm Ao Asc  diam:  3.90 cm MITRAL VALVE MV Area (PHT): 4.60 cm     SHUNTS MV Decel Time: 165 msec     Systemic VTI:  0.19 m MR Peak grad: 101.6 mmHg    Systemic Diam: 2.00 cm MR Mean grad: 69.0 mmHg MR Vmax:      504.00 cm/s MR Vmean:     399.0 cm/s MV E velocity: 129.00 cm/s MV A velocity: 40.30 cm/s MV E/A ratio:  3.20 Stanly Leavens MD Electronically signed by Stanly Leavens MD Signature Date/Time: 07/18/2024/4:12:29 PM    Final        LOS: 1 day   Aldrich Lloyd U Chantz Montefusco  [facility] Pager on [email or web address]  07/19/2024, 1:39 PM   "

## 2024-07-20 ENCOUNTER — Other Ambulatory Visit (HOSPITAL_COMMUNITY): Payer: Self-pay

## 2024-07-20 DIAGNOSIS — I2510 Atherosclerotic heart disease of native coronary artery without angina pectoris: Secondary | ICD-10-CM

## 2024-07-20 LAB — BASIC METABOLIC PANEL WITH GFR
Anion gap: 8 (ref 5–15)
BUN: <5 mg/dL — ABNORMAL LOW (ref 6–20)
CO2: 31 mmol/L (ref 22–32)
Calcium: 9.2 mg/dL (ref 8.9–10.3)
Chloride: 100 mmol/L (ref 98–111)
Creatinine, Ser: 0.59 mg/dL (ref 0.44–1.00)
GFR, Estimated: >60 mL/min
Glucose, Bld: 110 mg/dL — ABNORMAL HIGH (ref 70–99)
Potassium: 4.6 mmol/L (ref 3.5–5.1)
Sodium: 139 mmol/L (ref 135–145)

## 2024-07-20 MED ORDER — OXYCODONE HCL 5 MG PO TABS
5.0000 mg | ORAL_TABLET | Freq: Four times a day (QID) | ORAL | 0 refills | Status: AC | PRN
  Filled 2024-07-20: qty 10, 3d supply, fill #0

## 2024-07-20 MED ORDER — ACETAMINOPHEN 325 MG PO TABS: 650.0000 mg | ORAL_TABLET | Freq: Four times a day (QID) | ORAL | Status: AC | PRN

## 2024-07-20 MED ORDER — SUCRALFATE 1 G PO TABS
1.0000 g | ORAL_TABLET | Freq: Three times a day (TID) | ORAL | 0 refills | Status: AC
  Filled 2024-07-20: qty 24, 6d supply, fill #0

## 2024-07-20 MED ORDER — POLYETHYLENE GLYCOL 3350 17 G PO PACK: 17.0000 g | PACK | Freq: Every day | ORAL | Status: AC

## 2024-07-20 MED ORDER — MAGNESIUM CHLORIDE 64 MG PO TBEC: 1.0000 | DELAYED_RELEASE_TABLET | Freq: Every day | ORAL | Status: DC

## 2024-07-20 MED ORDER — MAGNESIUM CHLORIDE 64 MG PO TBEC
1.0000 | DELAYED_RELEASE_TABLET | Freq: Every day | ORAL | 0 refills | Status: AC
  Filled 2024-07-20: qty 30, 30d supply, fill #0

## 2024-07-20 MED ORDER — PANTOPRAZOLE SODIUM 40 MG PO TBEC
40.0000 mg | DELAYED_RELEASE_TABLET | Freq: Two times a day (BID) | ORAL | 0 refills | Status: AC
  Filled 2024-07-20: qty 60, 30d supply, fill #0

## 2024-07-20 MED ORDER — METHOCARBAMOL 500 MG PO TABS: 500.0000 mg | ORAL_TABLET | Freq: Once | ORAL | Status: DC

## 2024-07-20 MED ORDER — METOPROLOL SUCCINATE ER 50 MG PO TB24: 50.0000 mg | ORAL_TABLET | Freq: Every evening | ORAL | Status: AC

## 2024-07-20 NOTE — Plan of Care (Signed)
  Problem: Education: Goal: Knowledge of General Education information will improve Description: Including pain rating scale, medication(s)/side effects and non-pharmacologic comfort measures Outcome: Adequate for Discharge   Problem: Health Behavior/Discharge Planning: Goal: Ability to manage health-related needs will improve Outcome: Adequate for Discharge   Problem: Clinical Measurements: Goal: Ability to maintain clinical measurements within normal limits will improve Outcome: Adequate for Discharge Goal: Will remain free from infection Outcome: Adequate for Discharge Goal: Diagnostic test results will improve Outcome: Adequate for Discharge Goal: Respiratory complications will improve Outcome: Adequate for Discharge Goal: Cardiovascular complication will be avoided Outcome: Adequate for Discharge   Problem: Activity: Goal: Risk for activity intolerance will decrease Outcome: Adequate for Discharge   Problem: Nutrition: Goal: Adequate nutrition will be maintained Outcome: Adequate for Discharge   Problem: Coping: Goal: Level of anxiety will decrease Outcome: Adequate for Discharge   Problem: Elimination: Goal: Will not experience complications related to bowel motility Outcome: Adequate for Discharge Goal: Will not experience complications related to urinary retention Outcome: Adequate for Discharge   Problem: Pain Managment: Goal: General experience of comfort will improve and/or be controlled Outcome: Adequate for Discharge   Problem: Safety: Goal: Ability to remain free from injury will improve Outcome: Adequate for Discharge

## 2024-07-20 NOTE — Discharge Summary (Signed)
 "     Physician Discharge Summary  [name] FMW:[id number] DOB: [birth year] DOA: 07/16/2024  PCP: Arloa Elsie SAUNDERS, MD  Admit date: 07/16/2024 Discharge date: 07/20/2024  Admitted From:  Discharge disposition: home   Recommendations for Outpatient Follow-Up:   Follow-up closely with GI for colonoscopy and findings from CT scan Home health for now as patient states she is weak Defer to PCP to adjust depression/anxiety medications   Discharge Diagnosis:   Principal Problem:   Chest pain Active Problems:   Influenza   Cardiomyopathy (HCC)   S/P CABG x 4   Heart failure with mildly reduced ejection fraction (HFmrEF, 41-49%) (HCC)   Tobacco use   Hyperlipidemia    Discharge Condition: Improved.  Diet recommendation: Soft  Wound care: None.  Code status: Full.   History of Present Illness:   HPI: [name] is a 54 year old female with history of CAD status post CABG 2024, anxiety, fibromyalgia, hyperlipidemia, continued tobacco smoking, GERD, hypertension.   07/16/2024: Patient presented to the ED for chief concerns of chest pain, abdominal pain, chills, nausea, vomiting, fatigue, diarrhea.   Vitals at the time of my evaluation showed t of 98.3, rr 19, hr 111, blood pressure 166/109, SpO2 of 94% on 2 L nasal cannula.   Serum sodium is 136, potassium 3.2, chloride 95, bicarb 27, BUN of 6, serum creatinine 0.47, eGFR greater than 60, nonfasting blood glucose 125, WBC 4.4, hemoglobin 13.08, platelets of 288.   Alk phos was elevated at 142.  AST 41.  ALT is 25.  HS troponin was less than 15 x 5.  Patient tested positive for influenza A.   ED treatment: Dilaudid  1 mg IV one-time dose, hydroxyzine  25 mg p.o. one-time dose, Ativan  1 mg IV one-time dose, morphine  4 mg IV one-time dose, ondansetron  4 mg IV one-time dose, sodium chloride  2 L bolus.   1/4: Patient accepted to hospitalist service pending bed availability.   1/5: I assumed care of the patient.   Admission via virtual encounter and admission orders are placed.   She reports sick contacts with her daughter and mother around Christmas and they were sick. She endorses nausea and vomiting, cough, productive of brown, green, yellow sputum.    Social history: She lives on her own. [name], last drink was 2 weeks ago. She denies recreational drug use. She is disabled.   Hospital Course by Problem:   Chest pain CTA ruled out PE.  Troponins were normal.  Could be experiencing discomfort from recent influenza infection.  Continue with Lidoderm  patch. -resolved   Sinus tachycardia/hypovolemia Appears to be hypovolemic-- Most likely due to recent influenza and GI symptoms.  Gently hydrate her.  Noted that she has a history of systolic CHF. Sinus tachycardia is most likely due to hypovolemia.  Improved with hydration Hold Adderall.   Nausea vomiting/diarrhea Most likely due to influenza.  CT abdomen did not show any acute findings.  Continue to monitor. -trial of PPI BID and carafate  with the ability to eat a full meal - Will need to follow-up outpatient with GI for repeat colonoscopy   Influenza This was during Christmas.  She told me this morning that she did take Tamiflu at that time.  No indication to resume Tamiflu at this time.  She is afebrile.   Chronic systolic CHF Noted to be hypovolemic.    Not on any diuretics at this time.-Patient states cardiology held her Aldactone   Last echocardiogram is from March 2025 which showed LVEF of 40 to 45%.  RV systolic function was normal- repeat echo-- similar to improved   Coronary artery disease status post CABG Cardiac status is stable.  Continue aspirin  and Brilinta .  Ruled out for ACS by negative troponins.   Hypokalemia/hypomagnesemia Will be supplemented.   Hyperlipidemia Continue her home medications.   Tobacco abuse Nicotine  patch.  Counseled.   Obesity Estimated  body mass index is 32.7 kg/m as calculated from the following:   Height as of this encounter: 5' 2 (1.575 m).   Weight as of this encounter: 81.1 kg.      Medical Consultants:      Discharge Exam:   Vitals:   07/20/24 0428 07/20/24 0826  BP: 112/75 103/82  Pulse: 99   Resp: 17 17  Temp: 98.8 F (37.1 C) 98.1 F (36.7 C)  SpO2:     Vitals:   07/19/24 1957 07/19/24 2317 07/20/24 0428 07/20/24 0826  BP: (!) 144/93 (!) 139/106 112/75 103/82  Pulse: 99 (!) 106 99   Resp: 18 18 17 17   Temp: 98.4 F (36.9 C) 98 F (36.7 C) 98.8 F (37.1 C) 98.1 F (36.7 C)  TempSrc: Oral Oral Axillary Axillary  SpO2: 95% 90%    Weight:      Height:        General exam: Appears calm and comfortable.  Ate 100% of her meal this a.m. 45 minutes   The results of significant diagnostics from this hospitalization (including imaging, microbiology, ancillary and laboratory) are listed below for reference.     Procedures and Diagnostic Studies:   CT Angio Chest PE W and/or Wo Contrast Result Date: 07/16/2024 CLINICAL DATA:  Elevated D-dimer.  Chest/epigastric pain. EXAM: CT ANGIOGRAPHY CHEST WITH CONTRAST TECHNIQUE: Multidetector CT imaging of the chest was performed using the standard protocol during bolus administration of intravenous contrast. Multiplanar CT image reconstructions and MIPs were obtained to evaluate the vascular anatomy. RADIATION DOSE REDUCTION: This exam was performed according to the departmental dose-optimization program which includes automated exposure control, adjustment of the mA and/or kV according to patient size and/or use of iterative reconstruction technique. CONTRAST:  60mL OMNIPAQUE  IOHEXOL  350 MG/ML SOLN COMPARISON:  04/21/2024. FINDINGS: Cardiovascular: Heart is enlarged and there is no pericardial effusion. Multi-vessel coronary artery calcifications are seen. There is atherosclerotic calcification of the aorta without evidence of aneurysm. The pulmonary trunk is  normal in caliber. No evidence of pulmonary embolism is seen. Mediastinum/Nodes: No mediastinal or axillary lymphadenopathy. Prominent lymph nodes are present in the hilar regions bilaterally measuring up to 1.1 cm. The thyroid gland, trachea, and esophagus are within normal limits. Lungs/Pleura: Scattered regional ground-glass opacities are noted in the lungs bilaterally. Bronchial wall thickening is present in the lower lobes bilaterally. No effusion or pneumothorax is seen. Upper Abdomen: The gallbladder is surgically absent. Bilateral adrenal nodules are noted, previously characterized as adenomas. No acute abnormality. Musculoskeletal: Bilateral breast implants and sternotomy wires are noted. Cervical spinal fusion hardware is seen. There are degenerative changes in the thoracic spine. No acute osseous abnormality. Review of the MIP images confirms the above findings. IMPRESSION: 1. No evidence of pulmonary embolism. 2. Regional ground-glass opacities in the lungs bilaterally, may be associated with air trapping, edema, or pneumonitis. 3. Stable bilateral adrenal adenomas. 4. Coronary artery calcifications and aortic atherosclerosis. Electronically Signed   By: Leita Birmingham M.D.   On: 07/16/2024 17:17   CT ABDOMEN PELVIS W CONTRAST Result Date: 07/16/2024 CLINICAL  DATA:  Generalized abdominal pain, diarrhea EXAM: CT ABDOMEN AND PELVIS WITH CONTRAST TECHNIQUE: Multidetector CT imaging of the abdomen and pelvis was performed using the standard protocol following bolus administration of intravenous contrast. RADIATION DOSE REDUCTION: This exam was performed according to the departmental dose-optimization program which includes automated exposure control, adjustment of the mA and/or kV according to patient size and/or use of iterative reconstruction technique. CONTRAST:  80mL OMNIPAQUE  IOHEXOL  350 MG/ML SOLN COMPARISON:  March 21, 2024 FINDINGS: Lower chest: No acute abnormality. Hepatobiliary: No focal liver  abnormality is seen. Status post cholecystectomy. No biliary dilatation. Pancreas: Unremarkable. No pancreatic ductal dilatation or surrounding inflammatory changes. Spleen: Normal in size without focal abnormality. Adrenals/Urinary Tract: Stable bilateral adrenal adenomas. No hydronephrosis or renal obstruction is noted. Urinary bladder is unremarkable. Stomach/Bowel: The stomach and appendix are unremarkable. There is no evidence of bowel obstruction. There is stable mild diffuse wall and fold thickening of the colon which may represent chronic sequela of prior inflammation, although acute inflammation cannot be excluded. However, there does not appear to be a significant amount of pericolonic inflammation to suggest acute inflammation. Vascular/Lymphatic: Aortic atherosclerosis. No enlarged abdominal or pelvic lymph nodes. Reproductive: Uterus and bilateral adnexa are unremarkable. Other: No abdominal wall hernia or abnormality. No abdominopelvic ascites. Musculoskeletal: No acute or significant osseous findings. IMPRESSION: 1. Stable bilateral adrenal adenomas. 2. Stable mild diffuse wall and fold thickening of the colon which may represent chronic sequela of prior inflammation, although acute inflammation cannot be excluded. However, there does not appear to be a significant amount of pericolonic inflammation to suggest acute inflammation. Potentially this may represent inflammatory bowel disease such as ulcerative colitis. 3. Aortic atherosclerosis. Aortic Atherosclerosis (ICD10-I70.0). Electronically Signed   By: Lynwood Landy Raddle M.D.   On: 07/16/2024 15:27   DG Chest Portable 1 View Result Date: 07/16/2024 CLINICAL DATA:  h/o STEMI, abdominal pain EXAM: PORTABLE CHEST 1 VIEW COMPARISON:  July 04, 2024 FINDINGS: The cardiomediastinal silhouette is unchanged in contour.Status post median sternotomy. No pleural effusion. No pneumothorax. No acute pleuroparenchymal abnormality. IMPRESSION: No acute  cardiopulmonary abnormality. Electronically Signed   By: Corean Salter M.D.   On: 07/16/2024 14:29     Labs:   Basic Metabolic Panel: Recent Labs  Lab 07/16/24 1402 07/18/24 0423 07/19/24 0453 07/20/24 0516  NA 136 138 136 139  K 3.2* 3.4* 4.4 4.6  CL 95* 103 100 100  CO2 27 26 29 31   GLUCOSE 125* 105* 107* 110*  BUN 6 <5* <5* <5*  CREATININE 0.47 0.46 0.48 0.59  CALCIUM  9.0 8.1* 8.5* 9.2  MG  --  1.3* 2.2  --    GFR Estimated Creatinine Clearance: 79.3 mL/min (by C-G formula based on SCr of 0.59 mg/dL). Liver Function Tests: Recent Labs  Lab 07/16/24 1402  AST 41  ALT 25  ALKPHOS 142*  BILITOT 0.7  PROT 6.8  ALBUMIN  3.9   Recent Labs  Lab 07/16/24 1402  LIPASE 46   No results for input(s): AMMONIA in the last 168 hours. Coagulation profile No results for input(s): INR, PROTIME in the last 168 hours.  CBC: Recent Labs  Lab 07/16/24 1402 07/18/24 0423 07/19/24 0453  WBC 4.4 3.1* 4.3  HGB 13.8 12.3 12.5  HCT 39.3 36.0 35.9*  MCV 97.3 99.2 99.2  PLT 288 229 222   Cardiac Enzymes: No results for input(s): CKTOTAL, CKMB, CKMBINDEX, TROPONINI in the last 168 hours. BNP: Invalid input(s): POCBNP CBG: No results for input(s): GLUCAP in the last  168 hours. D-Dimer No results for input(s): DDIMER in the last 72 hours. Hgb A1c No results for input(s): HGBA1C in the last 72 hours. Lipid Profile No results for input(s): CHOL, HDL, LDLCALC, TRIG, CHOLHDL, LDLDIRECT in the last 72 hours. Thyroid function studies Recent Labs    07/19/24 0453  TSH 0.627   Anemia work up No results for input(s): VITAMINB12, FOLATE, FERRITIN, TIBC, IRON, RETICCTPCT in the last 72 hours. Microbiology Recent Results (from the past 240 hours)  Resp panel by RT-PCR (RSV, Flu A&B, Covid) Anterior Nasal Swab     Status: Abnormal   Collection Time: 07/16/24  2:00 PM   Specimen: Anterior Nasal Swab  Result Value Ref Range Status    SARS Coronavirus 2 by RT PCR NEGATIVE NEGATIVE Final    Comment: (NOTE) SARS-CoV-2 target nucleic acids are NOT DETECTED.  The SARS-CoV-2 RNA is generally detectable in upper respiratory specimens during the acute phase of infection. The lowest concentration of SARS-CoV-2 viral copies this assay can detect is 138 copies/mL. A negative result does not preclude SARS-Cov-2 infection and should not be used as the sole basis for treatment or other patient management decisions. A negative result may occur with  improper specimen collection/handling, submission of specimen other than nasopharyngeal swab, presence of viral mutation(s) within the areas targeted by this assay, and inadequate number of viral copies(<138 copies/mL). A negative result must be combined with clinical observations, patient history, and epidemiological information. The expected result is Negative.  Fact Sheet for Patients:  bloggercourse.com  Fact Sheet for Healthcare Providers:  seriousbroker.it  This test is no t yet approved or cleared by the [place]  FDA and  has been authorized for detection and/or diagnosis of SARS-CoV-2 by FDA under an Emergency Use Authorization (EUA). This EUA will remain  in effect (meaning this test can be used) for the duration of the COVID-19 declaration under Section 564(b)(1) of the Act, 21 U.S.C.section 360bbb-3(b)(1), unless the authorization is terminated  or revoked sooner.       Influenza A by PCR POSITIVE (A) NEGATIVE Final   Influenza B by PCR NEGATIVE NEGATIVE Final    Comment: (NOTE) The Xpert Xpress SARS-CoV-2/FLU/RSV plus assay is intended as an aid in the diagnosis of influenza from Nasopharyngeal swab specimens and should not be used as a sole basis for treatment. Nasal washings and aspirates are unacceptable for Xpert Xpress SARS-CoV-2/FLU/RSV testing.  Fact Sheet for  Patients: bloggercourse.com  Fact Sheet for Healthcare Providers: seriousbroker.it  This test is not yet approved or cleared by the [place]  FDA and has been authorized for detection and/or diagnosis of SARS-CoV-2 by FDA under an Emergency Use Authorization (EUA). This EUA will remain in effect (meaning this test can be used) for the duration of the COVID-19 declaration under Section 564(b)(1) of the Act, 21 U.S.C. section 360bbb-3(b)(1), unless the authorization is terminated or revoked.     Resp Syncytial Virus by PCR NEGATIVE NEGATIVE Final    Comment: (NOTE) Fact Sheet for Patients: bloggercourse.com  Fact Sheet for Healthcare Providers: seriousbroker.it  This test is not yet approved or cleared by the [place]  FDA and has been authorized for detection and/or diagnosis of SARS-CoV-2 by FDA under an Emergency Use Authorization (EUA). This EUA will remain in effect (meaning this test can be used) for the duration of the COVID-19 declaration under Section 564(b)(1) of the Act, 21 U.S.C. section 360bbb-3(b)(1), unless the authorization is terminated or revoked.  Performed at [facility], [address]  [place], [place] [address]      Discharge Instructions:   Discharge Instructions     Discharge instructions   Complete by: As directed    Soft easy to digest foods Call GI and set up follow up for colonoscopy Stay hydrated   Increase activity slowly   Complete by: As directed       Allergies as of 07/20/2024       Reactions   Zoloft [sertraline] Other (See Comments)   Altered mental state        Medication List     PAUSE taking these medications    amphetamine -dextroamphetamine  30 MG 24 hr capsule Wait to take this until your doctor or other care provider tells you to start again. Commonly known as: ADDERALL XR Take 60 mg by  mouth daily.   spironolactone  25 MG tablet Wait to take this until your doctor or other care provider tells you to start again. Commonly known as: ALDACTONE  Take 1 tablet (25 mg total) by mouth daily.       STOP taking these medications    omeprazole 20 MG capsule Commonly known as: PRILOSEC       TAKE these medications    acetaminophen  325 MG tablet Commonly known as: TYLENOL  Take 2 tablets (650 mg total) by mouth every 6 (six) hours as needed for mild pain (pain score 1-3), fever or headache (or Fever >/= 101).   albuterol  108 (90 Base) MCG/ACT inhaler Commonly known as: VENTOLIN  HFA Inhale 2 puffs into the lungs every 4 (four) hours as needed for wheezing or shortness of breath.   albuterol  108 (90 Base) MCG/ACT inhaler Commonly known as: VENTOLIN  HFA Inhale 1-2 puffs into the lungs every 6 (six) hours as needed for wheezing or shortness of breath (wheezing, shortness of breath).   ALPRAZolam  0.5 MG tablet Commonly known as: XANAX  1 tablet Orally once a day for 30 days As needed What changed: See the new instructions.   Ascorbic Acid  500 MG Caps Take 1 capsule by mouth daily.   aspirin  EC 81 MG tablet Take 1 tablet (81 mg total) by mouth daily. Swallow whole.   atorvastatin  80 MG tablet Commonly known as: LIPITOR  Take 1 tablet (80 mg total) by mouth daily.   cholecalciferol 25 MCG (1000 UNIT) tablet Commonly known as: VITAMIN D3 Take 1,000 Units by mouth daily.   cyanocobalamin  1000 MCG tablet Commonly known as: VITAMIN B12 Take 1,000 mcg by mouth daily.   escitalopram  10 MG tablet Commonly known as: LEXAPRO  Take 10 mg by mouth daily as needed (anxiety).   Excedrin Migraine 250-250-65 MG tablet Generic drug: aspirin -acetaminophen -caffeine  Take 2 tablets by mouth daily as needed for headache or migraine.   ezetimibe  10 MG tablet Commonly known as: ZETIA  Take 1 tablet (10 mg total) by mouth daily. What changed: when to take this   furosemide  20 MG  tablet Commonly known as: LASIX  Take 1 tablet (20 mg total) by mouth daily as needed (for lower extremity edema).   gabapentin  400 MG capsule Commonly known as: NEURONTIN  Take 800 mg by mouth daily as needed (for pain).   icosapent  Ethyl 1 g capsule Commonly known as: Vascepa  Take 2 capsules (2 g total) by mouth 2 (two) times daily.   magnesium  chloride 64 MG Tbec SR tablet Commonly known as: SLOW-MAG Take 1 tablet (64 mg total) by mouth daily. Start taking on: July 21, 2024   metoprolol  succinate 50 MG 24 hr tablet Commonly known as: TOPROL -XL Take 1 tablet (  50 mg total) by mouth every evening.   nicotine  21 mg/24hr patch Commonly known as: NICODERM CQ  - dosed in mg/24 hours Place 1 patch (21 mg total) onto the skin daily.   nitroGLYCERIN  0.4 MG SL tablet Commonly known as: NITROSTAT  PLACE 1 TABLET UNDER THE TONGUE EVERY 5 MINUTES AS NEEDED FOR CHEST PAIN.   ondansetron  8 MG disintegrating tablet Commonly known as: ZOFRAN -ODT Take 8 mg by mouth 3 (three) times daily as needed for vomiting or nausea.   oxyCODONE  5 MG immediate release tablet Commonly known as: Oxy IR/ROXICODONE  Take 1 tablet (5 mg total) by mouth every 6 (six) hours as needed for severe pain (pain score 7-10).   pantoprazole  40 MG tablet Commonly known as: PROTONIX  Take 1 tablet (40 mg total) by mouth 2 (two) times daily.   polyethylene glycol 17 g packet Commonly known as: MiraLax  Take 17 g by mouth daily.   sucralfate  1 GM/10ML suspension Commonly known as: CARAFATE  Take 10 mLs (1 g total) by mouth 4 (four) times daily -  with meals and at bedtime.   ticagrelor  90 MG Tabs tablet Commonly known as: BRILINTA  Take 1 tablet (90 mg total) by mouth 2 (two) times daily.   zolpidem  10 MG tablet Commonly known as: AMBIEN  Take 10 mg by mouth at bedtime.          Time coordinating discharge: 45 minutes  Signed:  Harlene RAYMOND Bowl DO  [facility] 07/20/2024, 11:17 AM      "

## 2024-07-20 NOTE — TOC Transition Note (Addendum)
 Transition of Care [facility]) - Discharge Note   Patient Details  Name: [name] MRN: [id number] Date of Birth: 10-16-1970  [facility] CM/SW Contact:  Sudie Erminio Deems, RN Phone Number: 07/20/2024, 11:35 AM   Clinical Narrative: Patient plans to transition home today and she has asked for home health PT services. MD has placed orders in EPIC. ICM spoke with patient regarding Medicare.gov list and agency choice. Patient asked for [facility]- not in network, [facility] and they declined. [facility] is not in the hub and clinicals were faxed awaiting response. Via the hub submitted to [facility] and [facility]-pending status for both. Patient is ready to discharge. Staff RN is aware that we have not confirmed HH at this time. ICM will call the patient with status update for [facility] if they can service the patient. Patient states daughter will be here by noon for transport home. No further needs identified at this time.   [id number] 07-20-24 [facility] will be able to service the patient for [facility] PT-office to call the patient and visit within 48 hours. No further needs identified at this time.   Final next level of care: Home w Home Health Services Barriers to Discharge: No Barriers Identified   Patient Goals and CMS Choice Patient states their goals for this hospitalization and ongoing recovery are:: Patient wants to return home with [facility] PT to become less deconditioned.   Choice offered to / list presented to : Patient      Discharge Placement                       Discharge Plan and Services Additional resources added to the After Visit Summary for   In-house Referral: NA Discharge Planning Services: CM Consult Post Acute Care Choice: Home Health            DME Agency: NA       HH Arranged: PT   Date HH Agency Contacted: 07/20/24 Time HH Agency Contacted: 1134 Representative spoke with at [facility] Agency: Hub and faxed to [facility]  Social Drivers of Health  (SDOH) Interventions SDOH Screenings   Food Insecurity: No Food Insecurity (07/17/2024)  Housing: Low Risk (07/17/2024)  Transportation Needs: No Transportation Needs (07/17/2024)  Utilities: Not At Risk (07/17/2024)  Depression (PHQ2-9): Medium Risk (03/16/2023)  Financial Resource Strain: Low Risk (10/02/2023)   Received from [facility]  Social Connections: Unknown (07/17/2024)  Tobacco Use: High Risk (07/17/2024)     Readmission Risk Interventions     No data to display

## 2024-07-21 ENCOUNTER — Other Ambulatory Visit (HOSPITAL_COMMUNITY): Payer: Self-pay

## 2024-07-24 ENCOUNTER — Encounter: Payer: Self-pay | Admitting: Physician Assistant

## 2024-07-26 ENCOUNTER — Telehealth (HOSPITAL_BASED_OUTPATIENT_CLINIC_OR_DEPARTMENT_OTHER): Payer: Self-pay

## 2024-07-26 NOTE — Telephone Encounter (Signed)
"  ° °  Patient Name: [name]  DOB: 1970/12/23 MRN: [id number]  Primary Cardiologist: Lonni Cash, MD  Chart reviewed as part of pre-operative protocol coverage. She had STEMI 09/2023 with stenting to her RCA. She also has a history of previous CABGx3. Dr. Wendel recommended uninterrupted dual antiplatelet therapy with aspirin  and Brilinta  for at least one year. Recommend delaying EGD and colonoscopy until 10/2024.  Will route this to the requesting surgeon and remove from the pre-op pool.  Saddie GORMAN Cleaves, NP 07/26/2024, 10:15 AM    "

## 2024-07-26 NOTE — Telephone Encounter (Signed)
"  ° °  Pre-operative Risk Assessment    Patient Name: [name]  DOB: Dec 28, 1970 MRN: [id number]   Date of last office visit: 07/04/24 with Lucien Date of next office visit: NA  Request for Surgical Clearance    Procedure:  Colonoscopy and Endoscopy   Date of Surgery:  Clearance TBD                                 Surgeon:  Not indicated Surgeon's Group or Practice Name:  [facility]  Phone number:  [phone] Fax number:  [phone]   Type of Clearance Requested:   - Medical  - Pharmacy:  Hold Ticagrelor  (Brilinta ) for 4 days prior    Type of Anesthesia:  Not Indicated   Additional requests/questions:    [name]   07/26/2024, 9:59 AM   "

## 2024-07-27 ENCOUNTER — Telehealth: Payer: Self-pay | Admitting: Physician Assistant

## 2024-07-27 NOTE — Telephone Encounter (Signed)
 Pt would like a c/b regarding recent hospital visit. Please advise

## 2024-07-27 NOTE — Telephone Encounter (Signed)
 Pt notified office of ED visit 1/4. Pt states she just left [place] walk-in clinic and given steroids and ABX today. Throat swelling for several years now-seen in [place] a while back. Pt states her oxygen saturation was dropping during hospital visit. Also states  Throat irritation started with the flu.  Pt states they quit checking her oxygen level in the hospital, can turn her neck a certain way and drop her oxygen saturation. Also states oxygen drops when she is asleep and was discharged with high level D-dimer and no CT head.   Was told to follow up with GI for Colonoscopy,  Card f/u, and requesting information regarding her D-dimer that was in the thousands.   On the phone with patient for over 12 minutes and pt has long history and multiple complaints, mostly chronic. Poor historian with fragmented story. Hx anxiety as well. Unsure of how we are able to assist this patient. Will send to Tessa, PA-C and Dr Verlin for assistance and recommendations. Verbalizes understanding of plan.

## 2024-07-28 NOTE — Telephone Encounter (Signed)
 Spoke with pt. Recommendations given. Pt stated understanding.

## 2024-08-02 ENCOUNTER — Emergency Department (HOSPITAL_COMMUNITY)
Admission: EM | Admit: 2024-08-02 | Discharge: 2024-08-02 | Disposition: A | Discharge status: Home / Self Care | Attending: Emergency Medicine | Admitting: Emergency Medicine

## 2024-08-02 ENCOUNTER — Emergency Department (HOSPITAL_COMMUNITY)

## 2024-08-02 ENCOUNTER — Encounter (HOSPITAL_COMMUNITY): Payer: Self-pay

## 2024-08-02 ENCOUNTER — Other Ambulatory Visit: Payer: Self-pay

## 2024-08-02 DIAGNOSIS — Z951 Presence of aortocoronary bypass graft: Secondary | ICD-10-CM | POA: Diagnosis not present

## 2024-08-02 DIAGNOSIS — R42 Dizziness and giddiness: Secondary | ICD-10-CM | POA: Diagnosis present

## 2024-08-02 DIAGNOSIS — I2510 Atherosclerotic heart disease of native coronary artery without angina pectoris: Secondary | ICD-10-CM | POA: Insufficient documentation

## 2024-08-02 DIAGNOSIS — F17200 Nicotine dependence, unspecified, uncomplicated: Secondary | ICD-10-CM | POA: Insufficient documentation

## 2024-08-02 DIAGNOSIS — R000 Tachycardia, unspecified: Secondary | ICD-10-CM | POA: Diagnosis not present

## 2024-08-02 DIAGNOSIS — J45909 Unspecified asthma, uncomplicated: Secondary | ICD-10-CM | POA: Diagnosis not present

## 2024-08-02 DIAGNOSIS — Z7982 Long term (current) use of aspirin: Secondary | ICD-10-CM | POA: Insufficient documentation

## 2024-08-02 LAB — URINALYSIS, ROUTINE W REFLEX MICROSCOPIC
Bilirubin Urine: NEGATIVE
Glucose, UA: NEGATIVE mg/dL
Hgb urine dipstick: NEGATIVE
Ketones, ur: NEGATIVE mg/dL
Nitrite: NEGATIVE
Protein, ur: NEGATIVE mg/dL
Specific Gravity, Urine: 1.01 (ref 1.005–1.030)
pH: 6 (ref 5.0–8.0)

## 2024-08-02 LAB — COMPREHENSIVE METABOLIC PANEL WITH GFR
ALT: 41 U/L (ref 0–44)
AST: 52 U/L — ABNORMAL HIGH (ref 15–41)
Albumin: 3.7 g/dL (ref 3.5–5.0)
Alkaline Phosphatase: 90 U/L (ref 38–126)
Anion gap: 10 (ref 5–15)
BUN: 10 mg/dL (ref 6–20)
CO2: 26 mmol/L (ref 22–32)
Calcium: 8.7 mg/dL — ABNORMAL LOW (ref 8.9–10.3)
Chloride: 102 mmol/L (ref 98–111)
Creatinine, Ser: 0.63 mg/dL (ref 0.44–1.00)
GFR, Estimated: >60 mL/min
Glucose, Bld: 109 mg/dL — ABNORMAL HIGH (ref 70–99)
Potassium: 4.1 mmol/L (ref 3.5–5.1)
Sodium: 137 mmol/L (ref 135–145)
Total Bilirubin: 0.3 mg/dL (ref 0.0–1.2)
Total Protein: 5.9 g/dL — ABNORMAL LOW (ref 6.5–8.1)

## 2024-08-02 LAB — CBC
HCT: 40.6 % (ref 36.0–46.0)
Hemoglobin: 13.8 g/dL (ref 12.0–15.0)
MCH: 34.2 pg — ABNORMAL HIGH (ref 26.0–34.0)
MCHC: 34 g/dL (ref 30.0–36.0)
MCV: 100.5 fL — ABNORMAL HIGH (ref 80.0–100.0)
Platelets: 393 K/uL (ref 150–400)
RBC: 4.04 MIL/uL (ref 3.87–5.11)
RDW: 15.1 % (ref 11.5–15.5)
WBC: 10.2 K/uL (ref 4.0–10.5)
nRBC: 0 % (ref 0.0–0.2)

## 2024-08-02 LAB — TROPONIN T, HIGH SENSITIVITY
Troponin T High Sensitivity: <6 ng/L (ref 0–19)
Troponin T High Sensitivity: <6 ng/L (ref 0–19)

## 2024-08-02 LAB — LIPASE, BLOOD: Lipase: 71 U/L — ABNORMAL HIGH (ref 11–51)

## 2024-08-02 LAB — TSH: TSH: 0.452 u[IU]/mL (ref 0.350–4.500)

## 2024-08-02 LAB — D-DIMER, QUANTITATIVE: D-Dimer, Quant: 0.59 ug{FEU}/mL — ABNORMAL HIGH (ref 0.00–0.50)

## 2024-08-02 MED ORDER — ASPIRIN 81 MG PO CHEW
324.0000 mg | CHEWABLE_TABLET | Freq: Once | ORAL | Status: AC
  Administered 2024-08-02: 324 mg via ORAL
  Filled 2024-08-02: qty 4

## 2024-08-02 MED ORDER — SODIUM CHLORIDE 0.9 % IV BOLUS
1000.0000 mL | Freq: Once | INTRAVENOUS | Status: AC
  Administered 2024-08-02: 1000 mL via INTRAVENOUS

## 2024-08-02 MED ORDER — MORPHINE SULFATE (PF) 4 MG/ML IV SOLN
4.0000 mg | Freq: Once | INTRAVENOUS | Status: AC
  Administered 2024-08-02: 4 mg via INTRAVENOUS
  Filled 2024-08-02: qty 1

## 2024-08-02 MED ORDER — IOHEXOL 350 MG/ML SOLN
75.0000 mL | Freq: Once | INTRAVENOUS | Status: AC | PRN
  Administered 2024-08-02: 75 mL via INTRAVENOUS

## 2024-08-02 MED ORDER — ONDANSETRON HCL 4 MG/2ML IJ SOLN
4.0000 mg | Freq: Once | INTRAMUSCULAR | Status: AC
  Administered 2024-08-02: 4 mg via INTRAVENOUS
  Filled 2024-08-02: qty 2

## 2024-08-02 MED ORDER — IOHEXOL 350 MG/ML SOLN
100.0000 mL | Freq: Once | INTRAVENOUS | Status: AC | PRN
  Administered 2024-08-02: 100 mL via INTRAVENOUS

## 2024-08-02 MED ORDER — LORAZEPAM 1 MG PO TABS
1.0000 mg | ORAL_TABLET | Freq: Once | ORAL | Status: AC
  Administered 2024-08-02: 1 mg via ORAL
  Filled 2024-08-02: qty 1

## 2024-08-02 MED ORDER — ACETAMINOPHEN 500 MG PO TABS
1000.0000 mg | ORAL_TABLET | Freq: Once | ORAL | Status: AC
  Administered 2024-08-02: 1000 mg via ORAL
  Filled 2024-08-02: qty 2

## 2024-08-02 NOTE — Progress Notes (Signed)
 Subjective: Asked to evaluate patient due to throat complaints.  Also being evaluated in ED for chest and abdominal pain.  She is a patient of Dr. Jesus who has seen her a few times over the past year or two for throat symptoms.  She reports a feeling like her throat could close up with a congested feeling to her right ear.  She denies pain, per se, in her throat.  She had a neck CT in the ED.  Objective: Vital signs in last 24 hours: Temp:  [97.6 F (36.4 C)-98.8 F (37.1 C)] 98.8 F (37.1 C) (01/21 1644) Pulse Rate:  [110-116] 110 (01/21 1644) Resp:  [16-18] 16 (01/21 1644) BP: (141-151)/(97-107) 151/107 (01/21 1644) SpO2:  [99 %-100 %] 100 % (01/21 1644) Wt Readings from Last 1 Encounters:  07/17/24 81.1 kg    Intake/Output from previous day: No intake/output data recorded. Intake/Output this shift: No intake/output data recorded.  General appearance: alert, cooperative, and appears uncomfortable pushing in on her central chest Ears: normal TM's and external ear canals both ears Throat: lips, mucosa, and tongue normal; teeth and gums normal Neck: no adenopathy and no mass or tenderness  Recent Labs    08/02/24 1247  WBC 10.2  HGB 13.8  HCT 40.6  PLT 393    Recent Labs    08/02/24 1247  NA 137  K 4.1  CL 102  CO2 26  GLUCOSE 109*  BUN 10  CREATININE 0.63  CALCIUM  8.7*    Medications: I have reviewed the patient's current medications.  Assessment/Plan: Throat discomfort  I personally reviewed her neck CT suggesting asymmetry of the mucosal surface in the left oropharynx/pharynx.  On fiberoptic exam, there is nothing abnormal in the pharynx with no mass, ulceration, or asymmetry.  She was reassured.  I discussed results with ED staff.  She can follow-up with Dr. Jesus in a routine fashion.   LOS: 0 days   Vaughan Ricker 08/02/2024, 6:04 PM

## 2024-08-02 NOTE — ED Notes (Signed)
 Pt transported to CT ?

## 2024-08-02 NOTE — Discharge Instructions (Addendum)
 Evaluation today was overall reassuring.  Please follow-up with your PCP.  Per ENT, inspection of your throat was grossly normal.  Please follow-up with [facility].  If you have any trouble breathing earlier, develop chest pain shortness of breath or any other concerning symptom please return to ED for further evaluation.

## 2024-08-02 NOTE — ED Provider Triage Note (Signed)
 Emergency Medicine Provider Triage Evaluation Note  [name] , a 55 y.o. female  was evaluated in triage.  Pt complains of discomfort with swallowing feels like she has a mass in her throat she is a daily smoker she is worried she has cancer.  She is also complaining of abdominal pain this appears to be chronic she states she had a recent hospitalization they were unable to do endoscopy or colonoscopy.  Review of Systems  Positive: Discomfort with swallowing abdominal pain Negative: Vomiting or diarrhea  Physical Exam  BP (!) 141/97   Pulse (!) 116   Temp 97.6 F (36.4 C)   Resp 18   SpO2 99%  Gen:   Awake, no distress   Resp:  Normal effort  MSK:   Moves extremities without difficulty  Other:  No palpable neck neck mass or abnormal phonation  Medical Decision Making  Medically screening exam initiated at 1:42 PM.  Appropriate orders placed.  [name] was informed that the remainder of the evaluation will be completed by another provider, this initial triage assessment does not replace that evaluation, and the importance of remaining in the ED until their evaluation is complete.     Arloa Chroman, PA-C 08/02/24 1343

## 2024-08-02 NOTE — ED Provider Notes (Signed)
 "  EMERGENCY DEPARTMENT AT [facility] Provider Note   CSN: [id number] Arrival date & time: 08/02/24  1219     Patient presents with: Abdominal Pain, Sore Throat, neck swelling, and Dizziness  HPI [name] is a 54 y.o. female s/p CABG x 4 presenting for sore throat and neck swelling and and abdominal pain.  The sore throat and neck swelling has been going on for a couple of years but worse in the last week.  She states she feels that there is a mass in her throat that may be getting bigger.  She also reports that turning her head to the left and right make it difficult to breathe at times.  Also reporting generalized abdominal pain has been going on for several months as well.  She endorses some nausea and states she vomited last week and has had persistent diarrhea.  Denies chest pain or shortness of breath today.  Past Medical History:  Diagnosis Date   A-fib (HCC)    Anxiety    Arthralgia    Asthma    Attention-deficit hyperactivity disorder, predominantly inattentive type    Back pain    Coronary artery disease    Dysrhythmia    Afib   Fear of flying    Fibromyalgia    denies having Fibromyalgia   Gastroesophageal reflux disease    HPV in female    Joint pain    Localized edema    Neck pain    OA (osteoarthritis)    Papillomavirus as the cause of diseases classified elsewhere    Primary insomnia    Pure hypercholesterolemia    Rheumatoid arthritis, seronegative, hand, unspecified laterality (HCC)    patient feels it is back related, not a true arthritis   Spondylolisthesis of lumbosacral region    Tobacco dependence    Unspecified inflammatory spondylopathy, cervical region    Vitamin B12 deficiency anemia    Voice hoarseness        Abdominal Pain Sore Throat Associated symptoms include abdominal pain.  Dizziness      Prior to Admission medications  Medication Sig Start Date End Date Taking? Authorizing Provider   acetaminophen  (TYLENOL ) 325 MG tablet Take 2 tablets (650 mg total) by mouth every 6 (six) hours as needed for mild pain (pain score 1-3), fever or headache (or Fever >/= 101). 07/20/24   Vann, Jessica U, DO  albuterol  (VENTOLIN  HFA) 108 (90 Base) MCG/ACT inhaler Inhale 2 puffs into the lungs every 4 (four) hours as needed for wheezing or shortness of breath. 10/15/22   Roddenberry, Myron G, PA-C  albuterol  (VENTOLIN  HFA) 108 (90 Base) MCG/ACT inhaler Inhale 1-2 puffs into the lungs every 6 (six) hours as needed for wheezing or shortness of breath (wheezing, shortness of breath). 04/10/24   Adrien Winfred Berke, MD  ALPRAZolam  (XANAX ) 0.5 MG tablet 1 tablet Orally once a day for 30 days As needed Patient taking differently: Take 0.5 mg by mouth at bedtime as needed for sleep or anxiety. 10/15/23   [provider]  [Paused] amphetamine -dextroamphetamine  (ADDERALL XR) 30 MG 24 hr capsule Take 60 mg by mouth daily. Wait to take this until your doctor or other care provider tells you to start again.    [provider]  Ascorbic Acid  500 MG CAPS Take 1 capsule by mouth daily.    [provider]  aspirin  EC 81 MG tablet Take 1 tablet (81 mg total) by mouth daily. Swallow whole. 01/03/24   McAlhany,  Lonni BIRCH, MD  aspirin -acetaminophen -caffeine  (EXCEDRIN MIGRAINE) 250-250-65 MG tablet Take 2 tablets by mouth daily as needed for headache or migraine.    [provider]  atorvastatin  (LIPITOR ) 80 MG tablet Take 1 tablet (80 mg total) by mouth daily. 09/06/23   Verlin Lonni BIRCH, MD  cholecalciferol (VITAMIN D3) 25 MCG (1000 UNIT) tablet Take 1,000 Units by mouth daily. 03/07/24   [provider]  cyanocobalamin  (VITAMIN B12) 1000 MCG tablet Take 1,000 mcg by mouth daily.    [provider]  escitalopram  (LEXAPRO ) 10 MG tablet Take 10 mg by mouth daily as needed (anxiety).    [provider]  ezetimibe  (ZETIA ) 10 MG tablet Take 1 tablet (10 mg  total) by mouth daily. Patient taking differently: Take 10 mg by mouth every evening. 01/11/24   Verlin Lonni BIRCH, MD  furosemide  (LASIX ) 20 MG tablet Take 1 tablet (20 mg total) by mouth daily as needed (for lower extremity edema). 10/11/23   Sheron Lorette GRADE, PA-C  gabapentin  (NEURONTIN ) 400 MG capsule Take 800 mg by mouth daily as needed (for pain).    [provider]  icosapent  Ethyl (VASCEPA ) 1 g capsule Take 2 capsules (2 g total) by mouth 2 (two) times daily. 06/22/24   Lucien Orren SAILOR, PA-C  magnesium  chloride (SLOW-MAG) 64 MG TBEC SR tablet Take 1 tablet (64 mg total) by mouth daily. 07/21/24   Vann, Jessica U, DO  metoprolol  succinate (TOPROL -XL) 50 MG 24 hr tablet Take 1 tablet (50 mg total) by mouth every evening. 07/20/24   Vann, Jessica U, DO  nicotine  (NICODERM CQ  - DOSED IN MG/24 HOURS) 21 mg/24hr patch Place 1 patch (21 mg total) onto the skin daily. 10/11/23   Sheron Lorette GRADE, PA-C  nitroGLYCERIN  (NITROSTAT ) 0.4 MG SL tablet PLACE 1 TABLET UNDER THE TONGUE EVERY 5 MINUTES AS NEEDED FOR CHEST PAIN. 10/05/23   Verlin Lonni BIRCH, MD  ondansetron  (ZOFRAN -ODT) 8 MG disintegrating tablet Take 8 mg by mouth 3 (three) times daily as needed for vomiting or nausea. 07/02/24   [provider]  oxyCODONE  (OXY IR/ROXICODONE ) 5 MG immediate release tablet Take 1 tablet (5 mg total) by mouth every 6 (six) hours as needed for severe pain (pain score 7-10). 07/20/24   Vann, Jessica U, DO  pantoprazole  (PROTONIX ) 40 MG tablet Take 1 tablet (40 mg total) by mouth 2 (two) times daily. 07/20/24   Vann, Jessica U, DO  polyethylene glycol (MIRALAX ) 17 g packet Take 17 g by mouth daily. 07/20/24   Vann, Jessica U, DO  [Paused] spironolactone  (ALDACTONE ) 25 MG tablet Take 1 tablet (25 mg total) by mouth daily. Wait to take this until your doctor or other care provider tells you to start again. 11/08/23   Lucien Orren SAILOR, PA-C  sucralfate  (CARAFATE ) 1 g tablet Take 1 tablet (1 g total) by  mouth 4 (four) times daily -  with meals and at bedtime. 07/20/24 07/26/24  Vann, Jessica U, DO  ticagrelor  (BRILINTA ) 90 MG TABS tablet Take 1 tablet (90 mg total) by mouth 2 (two) times daily. 03/23/24   Verlin Lonni BIRCH, MD  zolpidem  (AMBIEN ) 10 MG tablet Take 10 mg by mouth at bedtime.    [provider]    Allergies: Zoloft [sertraline]    Review of Systems  Gastrointestinal:  Positive for abdominal pain.  Neurological:  Positive for dizziness.     Physical Exam   Vitals:   08/02/24 2102 08/02/24 2130  BP: (!) 132/92 (!) 141/96  Pulse: (!) 116 (!) 113  Resp: (!) 22 20  Temp:    SpO2: 96% 95%    CONSTITUTIONAL:  well-appearing, NAD NEURO:  Alert and oriented x 3, CN 3-12 grossly intact EYES:  eyes equal and reactive ENT/NECK:  Supple, no stridor, uvula is midline without edema, no swelling or erythema in the posterior oropharynx, appears grossly normal CARDIO:  regular rate and rhythm, appears well-perfused  PULM:  No respiratory distress, CTAB GI/GU:  non-distended, soft, non tender MSK/SPINE:  No gross deformities, no edema, moves all extremities  SKIN:  no rash, atraumatic   *Additional and/or pertinent findings included in MDM below    (all labs ordered are listed, but only abnormal results are displayed) Labs Reviewed  LIPASE, BLOOD - Abnormal; Notable for the following components:      Result Value   Lipase 71 (*)    All other components within normal limits  COMPREHENSIVE METABOLIC PANEL WITH GFR - Abnormal; Notable for the following components:   Glucose, Bld 109 (*)    Calcium  8.7 (*)    Total Protein 5.9 (*)    AST 52 (*)    All other components within normal limits  CBC - Abnormal; Notable for the following components:   MCV 100.5 (*)    MCH 34.2 (*)    All other components within normal limits  URINALYSIS, ROUTINE W REFLEX MICROSCOPIC - Abnormal; Notable for the following components:   Leukocytes,Ua MODERATE (*)    Bacteria, UA RARE  (*)    All other components within normal limits  D-DIMER, QUANTITATIVE - Abnormal; Notable for the following components:   D-Dimer, Quant 0.59 (*)    All other components within normal limits  TSH  TROPONIN T, HIGH SENSITIVITY  TROPONIN T, HIGH SENSITIVITY    EKG: None  Radiology: CT Angio Chest PE W/Cm &/Or Wo Cm Result Date: 08/02/2024 EXAM: CTA of the Chest with contrast for PE 08/02/2024 09:27:11 PM TECHNIQUE: CTA of the chest was performed after the administration of 75 mL of iohexol  (OMNIPAQUE ) 350 MG/ML injection. Multiplanar reformatted images are provided for review. MIP images are provided for review. Automated exposure control, iterative reconstruction, and/or weight based adjustment of the mA/kV was utilized to reduce the radiation dose to as low as reasonably achievable. COMPARISON: 07/16/2024 CLINICAL HISTORY: Pulmonary embolism (PE) suspected, low to intermediate prob, positive D-dimer. Additional history of chest pain. FINDINGS: PULMONARY ARTERIES: Pulmonary arteries are adequately opacified for evaluation. No pulmonary embolism. Main pulmonary artery is normal in caliber. MEDIASTINUM: The heart demonstrates moderate 3-vessel coronary atherosclerosis, status post CABG, and a left atrial appendage occluder. The pericardium demonstrates no acute abnormality. There is mild thoracic aortic atherosclerosis. LYMPH NODES: No mediastinal, hilar or axillary lymphadenopathy. LUNGS AND PLEURA: Mild bilateral lower lobe atelectasis. No focal consolidation or pulmonary edema. No pleural effusion or pneumothorax. UPPER ABDOMEN: Limited images of the upper abdomen are unremarkable. SOFT TISSUES AND BONES: Bilateral breast augmentation. Lower cervical spine fixation hardware, incompletely visualized. Median sternotomy. Mild degenerative changes of the visualized thoracolumbar spine. No acute bone or soft tissue abnormality. IMPRESSION: 1. No pulmonary embolism. 2. Negative CT chest. 3. Aortic  Atherosclerosis (ICD10-I70.0). Electronically signed by: Pinkie Pebbles MD 08/02/2024 09:34 PM EST RP Workstation: [id number]   DG Chest 2 View Result Date: 08/02/2024 CLINICAL DATA:  Chest pain. EXAM: CHEST - 2 VIEW COMPARISON:  July 27, 2024 FINDINGS: Multiple sternal wires and vascular clips are noted. The heart size and mediastinal contours are within normal limits. A left  atrial appendage clip is seen. Low lung volumes are noted. Mild atelectatic changes are suspected within the lateral aspect of the bilateral lung bases. No pleural effusion or pneumothorax is identified. Postoperative changes are seen within the cervical spine. The visualized skeletal structures are otherwise unremarkable. IMPRESSION: 1. Evidence of prior median sternotomy/CABG. 2. Low lung volumes with mild bibasilar atelectasis. Electronically Signed   By: Suzen Dials M.D.   On: 08/02/2024 18:27   CT Soft Tissue Neck W Contrast Result Date: 08/02/2024 EXAM: CT NECK WITH CONTRAST 08/02/2024 02:14:52 PM TECHNIQUE: CT of the neck was performed with the administration of 100 mL of iohexol  (OMNIPAQUE ) 350 MG/ML injection. Multiplanar reformatted images are provided for review. Automated exposure control, iterative reconstruction, and/or weight based adjustment of the mA/kV was utilized to reduce the radiation dose to as low as reasonably achievable. COMPARISON: CT of the neck dated 01/10/2024. CLINICAL HISTORY: Neck mass, nonpulsatile. FINDINGS: AERODIGESTIVE TRACT: There is asymmetric mucosal thickening of the oropharyngeal and hypopharyngeal wall posteriorly and laterally on the left with partial obliteration of the adjacent parapharyngeal fat. The area of mucosal thickening extends caudally to the pharyngeal epiglottic fold. No discrete mass. No edema. Differential diagnoses for the mucosal thickening include inflammatory/infectious processes (e.g., pharyngitis, tonsillitis, epiglottitis), benign lesions (e.g., cysts, polyps),  or malignancy. Follow-up guidelines: Consider clinical correlation and/or further imaging with dedicated contrast-enhanced MRI of the neck for better soft tissue characterization, especially if clinical symptoms persist or worsen. SALIVARY GLANDS: The parotid and submandibular glands are unremarkable. THYROID: Unremarkable. LYMPH NODES: No pathologically enlarged lymph nodes. There is a left level 2 node seen on image 58 of series 5 measuring approximately 6 mm in long axis. SOFT TISSUES: Status post sternotomy. No mass or fluid collection. BONES: Status post interbody fusion and anterior spinal fixation of C4-C5, C5-C6 and C6-C7. OTHER: Visualized sinuses and mastoid air cells are well aerated. Visualized lungs are clear. IMPRESSION: 1. Asymmetric mucosal thickening of the oropharyngeal and hypopharyngeal wall posteriorly and laterally on the left with partial obliteration of the adjacent parapharyngeal fat, extending caudally to the pharyngoepiglottic fold. No discrete mass. Differential considerations include infectious/inflammatory pharyngitis with edema, early mucosal squamous cell carcinoma, and less likely infiltrative neoplasm (e.g., lymphoma). Recommend ENT evaluation with direct visualization (flexible laryngoscopy) and consideration of biopsy as clinically indicated; if no mucosal lesion is identified or if symptoms persist, consider short-interval follow-up imaging (contrast-enhanced CT or MRI) to document resolution/stability. 2. No pathologically enlarged cervical lymph nodes. Left level II lymph node measures 6 mm in long axis, within normal size limits. Electronically signed by: Evalene Coho MD 08/02/2024 03:05 PM EST RP Workstation: [id number]   CT ABDOMEN PELVIS W CONTRAST Result Date: 08/02/2024 EXAM: CT ABDOMEN AND PELVIS WITH CONTRAST 08/02/2024 02:14:52 PM TECHNIQUE: CT of the abdomen and pelvis was performed with the administration of 100 mL of iohexol  (OMNIPAQUE ) 350 MG/ML injection.  Multiplanar reformatted images are provided for review. Automated exposure control, iterative reconstruction, and/or weight-based adjustment of the mA/kV was utilized to reduce the radiation dose to as low as reasonably achievable. COMPARISON: 07/16/2024 CLINICAL HISTORY: Abdominal pain, acute, nonlocalized. FINDINGS: LOWER CHEST: Partially visualized bilateral breast implants. Posterior bibasilar dependent atelectasis. LIVER: Focal fatty infiltration along the falciform ligament. GALLBLADDER AND BILE DUCTS: Cholecystectomy. No biliary ductal dilatation. SPLEEN: No acute abnormality. PANCREAS: No acute abnormality. ADRENAL GLANDS: Redemonstration of a couple of small bilateral adrenal adenomas, overall unchanged. KIDNEYS, URETERS AND BLADDER: No stones in the kidneys or ureters. No hydronephrosis. No perinephric or periureteral stranding.  Decompressed urinary bladder. GI AND BOWEL: Stomach demonstrates no acute abnormality. Sigmoid diverticulosis. Normal appendix. Submucosal fat deposition in the right colon, unchanged, likely due to chronic inflammation or obesity. There is no bowel obstruction. PERITONEUM AND RETROPERITONEUM: No ascites. No free air. VASCULATURE: Aorta is normal in caliber. Diffuse aortoiliac atherosclerosis. LYMPH NODES: No lymphadenopathy. REPRODUCTIVE ORGANS: Age related atrophy of the uterus and ovaries. BONES AND SOFT TISSUES: Multilevel degenerative disc disease of the thoracolumbar spine. Mild grade 1 anterolisthesis of L5 on S1 with anterior fusion hardware again noted. No focal soft tissue abnormality. IMPRESSION: 1. No acute findings in the abdomen or pelvis. 2. Sigmoid diverticulosis. No changes of acute diverticulitis. Electronically signed by: Rogelia Myers MD 08/02/2024 02:55 PM EST RP Workstation: [id number]     Procedures   Medications Ordered in the ED  iohexol  (OMNIPAQUE ) 350 MG/ML injection 100 mL (100 mLs Intravenous Contrast Given 08/02/24 1407)  ondansetron  (ZOFRAN )  injection 4 mg (4 mg Intravenous Given 08/02/24 1652)  morphine  (PF) 4 MG/ML injection 4 mg (4 mg Intravenous Given 08/02/24 1652)  LORazepam  (ATIVAN ) tablet 1 mg (1 mg Oral Given 08/02/24 1652)  sodium chloride  0.9 % bolus 1,000 mL (0 mLs Intravenous Stopped 08/02/24 1959)  aspirin  chewable tablet 324 mg (324 mg Oral Given 08/02/24 1731)  acetaminophen  (TYLENOL ) tablet 1,000 mg (1,000 mg Oral Given 08/02/24 2006)  sodium chloride  0.9 % bolus 1,000 mL (0 mLs Intravenous Stopped 08/02/24 2115)  iohexol  (OMNIPAQUE ) 350 MG/ML injection 75 mL (75 mLs Intravenous Contrast Given 08/02/24 2127)    Clinical Course as of 08/02/24 2201  Wed Aug 02, 2024  1649 Noted to be persistently tachycardic.  On reassessment patient stated that she did have some pain in the center of her chest that felt sharp.  Ordered fluids and chest pain workup with D-dimer.  Will reassess after labs returned. [JR]  1714 Patient followed by [facility].  Discussed with Dr. Carlie who advised that he will come by and evaluate her via scope no further recommendations at this time. [JR]  1934 Was scoped at the bedside by Dr. Carlie and reports that the evaluation was largely unremarkable. See his notes for further details.  [JR]    Clinical Course User Index [JR] Lang Norleen POUR, PA-C                                 Medical Decision Making Amount and/or Complexity of Data Reviewed Labs: ordered. Radiology: ordered.  Risk OTC drugs. Prescription drug management.   Initial Impression and Ddx 54 year old well-appearing female presenting for neck pain, chest pain and abdominal pain.  Exam notable for tachycardia but otherwise reassuring.  DDx includes peritonsillar abscess, malignancy, retropharyngeal abscess, PE, arrhythmia, thyroid storm, other. Patient PMH that increases complexity of ED encounter:  CABG  Interpretation of Diagnostics - I independent reviewed and interpreted the labs as followed: d dimer 0.59, tsh normal,  lipase 71  - I independently visualized the following imaging with scope of interpretation limited to determining acute life threatening conditions related to emergency care: CT neck, which revealed mucosal thickening in the hypopharyngeal and oropharyngeal region.  See details above.  Chest x-ray, CT angio chest was negative for acute findings.   - I personally reviewed and interpreted EKG which revealed sinus tachycardia  Patient Reassessment and Ultimate Disposition/Management On reassessment, tachycardia had improved somewhat with fluids.  Per monitor appears to be sinus tachycardia. suspect it could be anxiety  playing a role in her persistent tachycardia as well.  Seem to proved as well after Ativan .  She does not have a PE and workup does not support sepsis.  TSH were normal.  Also was evaluated by Dr. Carlie by ENT and scoped at the bedside and he stated that oropharyngeal and hypopharyngeal area appeared grossly normal.  Advised follow-up with [facility].  Overall workup here is unremarkable.  Advised PCP follow-up as well.  Discussed return precautions.  Discharged.  Patient management required discussion with the following services or consulting groups:  ENT/Plastic Surgery  Complexity of Problems Addressed Acute complicated illness or Injury  Additional Data Reviewed and Analyzed Further history obtained from: Past medical history and medications listed in the EMR and Prior ED visit notes  Patient Encounter Risk Assessment Consideration of hospitalization      Final diagnoses:  Tachycardia    ED Discharge Orders     None          Lang Norleen POUR, PA-C 08/02/24 2202    Tegeler, Lonni PARAS, MD 08/02/24 2316  "

## 2024-08-02 NOTE — Procedures (Signed)
 Preop diagnosis: Throat discomfort Postop diagnosis: same Procedure: Transnasal fiberoptic laryngoscopy Surgeon: Carlie Anesth: Topical with 4% lidocaine  Compl: None Findings: Normal pharynx and larynx aside from some posterior commissure hypertrophy. Description:  After discussing risks, benefits, and alternatives, the patient was placed in a seated position and the right nasal passage was sprayed with topical anesthetic.  The fiberoptic scope was passed through the right nasal passage to view the pharynx and larynx.  Findings are noted above.  The scope was then removed and he was returned to nursing care in stable condition.

## 2024-08-02 NOTE — ED Triage Notes (Signed)
 In addition to initial triage note, pt reports abdominal pain for the past couple of years. She states the swelling in her neck has been going on since March 2024 after having bypass surgery. Dizziness has been intermittent for the past couple of weeks. PT AxOx4. Able to speak in complete sentences.

## 2024-08-02 NOTE — ED Triage Notes (Signed)
 Pt. Stated, I'm having stomach pain, and feel like my neck is swollen making it hard to swallow. Im also dizzy.

## 2024-08-11 ENCOUNTER — Encounter (INDEPENDENT_AMBULATORY_CARE_PROVIDER_SITE_OTHER): Payer: Self-pay
